# Patient Record
Sex: Female | Born: 1955
Health system: Southern US, Community
[De-identification: ages and names within clinical notes are randomized; demographics above are authoritative.]

## PROBLEM LIST (undated history)

## (undated) DIAGNOSIS — B019 Varicella without complication: Secondary | ICD-10-CM

## (undated) DIAGNOSIS — E785 Hyperlipidemia, unspecified: Secondary | ICD-10-CM

## (undated) DIAGNOSIS — E669 Obesity, unspecified: Secondary | ICD-10-CM

## (undated) DIAGNOSIS — I10 Essential (primary) hypertension: Secondary | ICD-10-CM

## (undated) DIAGNOSIS — E079 Disorder of thyroid, unspecified: Secondary | ICD-10-CM

## (undated) DIAGNOSIS — B9681 Helicobacter pylori [H. pylori] as the cause of diseases classified elsewhere: Secondary | ICD-10-CM

## (undated) DIAGNOSIS — M1990 Unspecified osteoarthritis, unspecified site: Secondary | ICD-10-CM

## (undated) DIAGNOSIS — C189 Malignant neoplasm of colon, unspecified: Secondary | ICD-10-CM

## (undated) DIAGNOSIS — G4730 Sleep apnea, unspecified: Secondary | ICD-10-CM

## (undated) HISTORY — PX: TOTAL THYROIDECTOMY: SHX2547

## (undated) HISTORY — DX: Sleep apnea, unspecified: G47.30

## (undated) HISTORY — DX: Obesity, unspecified: E66.9

## (undated) HISTORY — PX: TUBAL LIGATION: SHX77

## (undated) HISTORY — DX: Varicella without complication: B01.9

## (undated) HISTORY — DX: Essential (primary) hypertension: I10

## (undated) HISTORY — DX: Unspecified osteoarthritis, unspecified site: M19.90

## (undated) HISTORY — DX: Hyperlipidemia, unspecified: E78.5

---

## 1998-09-30 ENCOUNTER — Other Ambulatory Visit: Admission: RE | Admit: 1998-09-30 | Discharge: 1998-09-30 | Payer: Self-pay | Admitting: Obstetrics and Gynecology

## 1998-09-30 ENCOUNTER — Encounter (INDEPENDENT_AMBULATORY_CARE_PROVIDER_SITE_OTHER): Payer: Self-pay

## 1999-01-03 ENCOUNTER — Other Ambulatory Visit: Admission: RE | Admit: 1999-01-03 | Discharge: 1999-01-03 | Payer: Self-pay | Admitting: *Deleted

## 2000-04-02 ENCOUNTER — Other Ambulatory Visit: Admission: RE | Admit: 2000-04-02 | Discharge: 2000-04-02 | Payer: Self-pay | Admitting: Obstetrics and Gynecology

## 2000-04-03 ENCOUNTER — Other Ambulatory Visit: Admission: RE | Admit: 2000-04-03 | Discharge: 2000-04-03 | Payer: Self-pay | Admitting: Obstetrics and Gynecology

## 2000-04-03 ENCOUNTER — Encounter (INDEPENDENT_AMBULATORY_CARE_PROVIDER_SITE_OTHER): Payer: Self-pay | Admitting: Specialist

## 2000-07-02 ENCOUNTER — Other Ambulatory Visit: Admission: RE | Admit: 2000-07-02 | Discharge: 2000-07-02 | Payer: Self-pay | Admitting: Obstetrics and Gynecology

## 2000-07-05 ENCOUNTER — Encounter: Payer: Self-pay | Admitting: Obstetrics and Gynecology

## 2000-07-05 ENCOUNTER — Encounter: Admission: RE | Admit: 2000-07-05 | Discharge: 2000-07-05 | Payer: Self-pay | Admitting: Obstetrics and Gynecology

## 2000-07-12 ENCOUNTER — Encounter: Payer: Self-pay | Admitting: Obstetrics and Gynecology

## 2000-07-12 ENCOUNTER — Encounter: Admission: RE | Admit: 2000-07-12 | Discharge: 2000-07-12 | Payer: Self-pay | Admitting: Obstetrics and Gynecology

## 2001-09-12 ENCOUNTER — Encounter: Admission: RE | Admit: 2001-09-12 | Discharge: 2001-09-12 | Payer: Self-pay | Admitting: Sports Medicine

## 2001-09-12 ENCOUNTER — Encounter: Payer: Self-pay | Admitting: Sports Medicine

## 2001-12-25 ENCOUNTER — Encounter: Payer: Self-pay | Admitting: Unknown Physician Specialty

## 2001-12-25 ENCOUNTER — Encounter: Admission: RE | Admit: 2001-12-25 | Discharge: 2001-12-25 | Payer: Self-pay | Admitting: Unknown Physician Specialty

## 2002-07-08 ENCOUNTER — Encounter: Payer: Self-pay | Admitting: Emergency Medicine

## 2002-07-08 ENCOUNTER — Emergency Department (HOSPITAL_COMMUNITY): Admission: EM | Admit: 2002-07-08 | Discharge: 2002-07-08 | Payer: Self-pay | Admitting: Emergency Medicine

## 2007-01-17 ENCOUNTER — Other Ambulatory Visit: Admission: RE | Admit: 2007-01-17 | Discharge: 2007-01-17 | Payer: Self-pay | Admitting: Obstetrics and Gynecology

## 2007-02-10 ENCOUNTER — Emergency Department (HOSPITAL_COMMUNITY): Admission: EM | Admit: 2007-02-10 | Discharge: 2007-02-10 | Payer: Self-pay | Admitting: Family Medicine

## 2008-12-28 ENCOUNTER — Ambulatory Visit: Payer: Self-pay | Admitting: Internal Medicine

## 2009-02-01 ENCOUNTER — Ambulatory Visit: Payer: Self-pay | Admitting: Family Medicine

## 2009-02-01 DIAGNOSIS — M25569 Pain in unspecified knee: Secondary | ICD-10-CM | POA: Insufficient documentation

## 2009-04-27 ENCOUNTER — Other Ambulatory Visit: Admission: RE | Admit: 2009-04-27 | Discharge: 2009-04-27 | Payer: Self-pay | Admitting: Obstetrics and Gynecology

## 2009-12-18 HISTORY — PX: ABDOMINAL HYSTERECTOMY: SHX81

## 2009-12-29 ENCOUNTER — Ambulatory Visit (HOSPITAL_COMMUNITY): Admission: RE | Admit: 2009-12-29 | Payer: Self-pay | Admitting: Obstetrics and Gynecology

## 2010-01-10 ENCOUNTER — Other Ambulatory Visit: Payer: Self-pay | Admitting: Obstetrics and Gynecology

## 2010-01-17 ENCOUNTER — Encounter (INDEPENDENT_AMBULATORY_CARE_PROVIDER_SITE_OTHER): Payer: Self-pay | Admitting: Obstetrics and Gynecology

## 2010-01-17 ENCOUNTER — Inpatient Hospital Stay (HOSPITAL_COMMUNITY): Admission: RE | Admit: 2010-01-17 | Discharge: 2010-01-19 | Payer: Self-pay | Admitting: Obstetrics and Gynecology

## 2010-01-17 HISTORY — PX: OTHER SURGICAL HISTORY: SHX169

## 2010-04-10 ENCOUNTER — Encounter: Payer: Self-pay | Admitting: Obstetrics and Gynecology

## 2010-05-31 LAB — CBC
HCT: 36.2 % (ref 36.0–46.0)
MCH: 28.1 pg (ref 26.0–34.0)
MCHC: 33.2 g/dL (ref 30.0–36.0)
MCV: 84.7 fL (ref 78.0–100.0)
RDW: 14.9 % (ref 11.5–15.5)

## 2010-06-01 LAB — BASIC METABOLIC PANEL
BUN: 11 mg/dL (ref 6–23)
Calcium: 8.6 mg/dL (ref 8.4–10.5)
Creatinine, Ser: 0.88 mg/dL (ref 0.4–1.2)
GFR calc non Af Amer: >60 mL/min (ref >60)
Glucose, Bld: 94 mg/dL (ref 70–99)

## 2010-06-01 LAB — TYPE AND SCREEN

## 2010-06-01 LAB — URINALYSIS, ROUTINE W REFLEX MICROSCOPIC
Hgb urine dipstick: NEGATIVE
Protein, ur: NEGATIVE mg/dL
Urobilinogen, UA: 0.2 mg/dL (ref 0.0–1.0)

## 2010-06-01 LAB — SURGICAL PCR SCREEN: Staphylococcus aureus: NEGATIVE

## 2010-06-01 LAB — CBC
HCT: 44.4 % (ref 36.0–46.0)
MCHC: 32.8 g/dL (ref 30.0–36.0)
Platelets: 287 10*3/uL (ref 150–400)
RDW: 15 % (ref 11.5–15.5)

## 2010-12-27 LAB — HIV ANTIBODY (ROUTINE TESTING W REFLEX): HIV: NONREACTIVE

## 2010-12-27 LAB — HEPATITIS C ANTIBODY: HCV Ab: NEGATIVE

## 2011-05-31 ENCOUNTER — Telehealth (INDEPENDENT_AMBULATORY_CARE_PROVIDER_SITE_OTHER): Payer: Self-pay | Admitting: Surgery

## 2011-05-31 ENCOUNTER — Ambulatory Visit (INDEPENDENT_AMBULATORY_CARE_PROVIDER_SITE_OTHER): Payer: Commercial Managed Care - PPO | Admitting: Surgery

## 2011-05-31 NOTE — Telephone Encounter (Signed)
Called patient at [phone]. (She had left a voicemail for me asking to reschedule her initial consult with Dr Ezzard Standing for an early morning/late afternoon appointment as she works 3rd shift.)  No one answered the phone when I called nor did an answering machine pick up. Patient did not provide a cell phone #. R/s'd appt for 06/29/11 @ 3:30 pm. Attached a note to the appointment card explaining to the patient that Dr Ezzard Standing has appt slots available for New Bariatric patients at 10:45 am and 3:30 pm. Mailed all information to patient.

## 2011-06-15 ENCOUNTER — Encounter: Payer: 59 | Attending: Internal Medicine | Admitting: *Deleted

## 2011-06-15 ENCOUNTER — Encounter: Payer: Self-pay | Admitting: *Deleted

## 2011-06-15 DIAGNOSIS — Z713 Dietary counseling and surveillance: Secondary | ICD-10-CM | POA: Insufficient documentation

## 2011-06-15 DIAGNOSIS — Z724 Inappropriate diet and eating habits: Secondary | ICD-10-CM | POA: Insufficient documentation

## 2011-06-15 NOTE — Progress Notes (Signed)
  Medical Nutrition Therapy:  Appt start time: 0800 end time:  0900.  Assessment:  Primary concerns today: Obesity.  Judith Ross is here today for nutritional counseling r/t obesity.  States she wants to "learn how to eat well and read food labels".  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  Eats most meals out bc she lives alone and does not like to cook. Food recall shows excessive CHO, fat, and sodium intake. Discussed CHO counting, increasing fiber, and increasing exercise to facilitate weight loss.    MEDICATIONS: See medication list. Pt takes medications for blood pressure and hypothyroidism, though cannot remember dosing at this time.    DIETARY INTAKE:  Usual eating pattern includes 3 meals and 0 snacks per day.  24-hr recall:  B (8 AM): Grits, sausage ball (sm), [place] bacon, 1 pancake; diet sunkist  Snk ( AM): Sleeping  L (6 PM): 2 hotdogs w/ chili on bun, 1 pc key lime pie; diet dr. Reino Kent Snk ( PM): None  D (1-2 AM): Grilled cheese, curly fries; diet sunkist Snk ( PM): None Beverages: diet sodas, coffee, sweet tea, water w/ crystal light  Usual physical activity: None  Estimated energy needs: 1800 calories 160-185 g carbohydrates 80-95 g protein 35-40 g fat (<10-12 as saturated fat)  Progress Towards Goal(s):  In progress.   Nutritional Diagnosis:  Bay-3.3 Overweight/obesity related to excessive CHO and fat intake as evidenced by patient-reported food recall and BMI of 41.9 kg/m^2.    Intervention/Goals:  Add lean protein foods to all meals and snacks.  Follow "Plate Method" for portion control - measure portions at first.  Limit carbohydrate to 3 servings (45 grams) per meal.   Choose more whole grains, lean protein, low-fat dairy, and fruits/non-starchy vegetables.   Aim for >30 min of physical activity most days. Start slow with stationary bike, water walking, etc.  Limit sugar-sweetened beverages and  concentrated sweets to facilitate weight loss.  Limit meals out to decrease sodium intake.  Handouts given during visit include:  Low CHO Snack List  45 g CHO meals  [employer] Guide handout  Monitoring/Evaluation:  Dietary intake, exercise, and body weight in 6 week(s).

## 2011-06-15 NOTE — Patient Instructions (Addendum)
Goals:  Add lean protein foods to all meals and snacks.  Follow "Plate Method" for portion control - measure portions at first.  Limit carbohydrate to 3 servings (45 grams) per meal.   Choose more whole grains, lean protein, low-fat dairy, and fruits/non-starchy vegetables.   Aim for >30 min of physical activity most days. Start slow with stationary bike, water walking, etc.  Limit sugar-sweetened beverages and concentrated sweets to facilitate weight loss.  Limit meals out to decrease sodium intake.

## 2011-06-29 ENCOUNTER — Ambulatory Visit (INDEPENDENT_AMBULATORY_CARE_PROVIDER_SITE_OTHER): Payer: Commercial Managed Care - PPO | Admitting: Surgery

## 2011-08-03 ENCOUNTER — Emergency Department (HOSPITAL_COMMUNITY)
Admission: EM | Admit: 2011-08-03 | Discharge: 2011-08-03 | Disposition: A | Discharge status: Home Health | Payer: 59 | Source: Home / Self Care | Attending: Emergency Medicine | Admitting: Emergency Medicine

## 2011-08-03 ENCOUNTER — Telehealth (HOSPITAL_COMMUNITY): Payer: Self-pay | Admitting: *Deleted

## 2011-08-03 ENCOUNTER — Encounter (HOSPITAL_COMMUNITY): Payer: Self-pay

## 2011-08-03 DIAGNOSIS — N898 Other specified noninflammatory disorders of vagina: Secondary | ICD-10-CM

## 2011-08-03 DIAGNOSIS — S335XXA Sprain of ligaments of lumbar spine, initial encounter: Secondary | ICD-10-CM

## 2011-08-03 HISTORY — DX: Disorder of thyroid, unspecified: E07.9

## 2011-08-03 LAB — POCT URINALYSIS DIP (DEVICE)
Bilirubin Urine: NEGATIVE
Glucose, UA: NEGATIVE mg/dL
Hgb urine dipstick: NEGATIVE
Specific Gravity, Urine: >=1.03 (ref 1.005–1.030)

## 2011-08-03 LAB — WET PREP, GENITAL

## 2011-08-03 MED ORDER — MELOXICAM 7.5 MG PO TABS: 7.5000 mg | ORAL_TABLET | Freq: Every day | ORAL | Status: DC

## 2011-08-03 MED ORDER — FLUCONAZOLE 150 MG PO TABS: 150.0000 mg | ORAL_TABLET | Freq: Once | ORAL | Status: AC

## 2011-08-03 MED ORDER — METRONIDAZOLE 500 MG PO TABS: 500.0000 mg | ORAL_TABLET | Freq: Two times a day (BID) | ORAL | Status: AC

## 2011-08-03 MED ORDER — CYCLOBENZAPRINE HCL 10 MG PO TABS
10.0000 mg | ORAL_TABLET | Freq: Two times a day (BID) | ORAL | Status: AC | PRN
Start: 2011-08-03 — End: 2011-08-13

## 2011-08-03 MED ORDER — MELOXICAM 7.5 MG PO TABS: 7.5000 mg | ORAL_TABLET | Freq: Every day | ORAL | Status: AC

## 2011-08-03 MED ORDER — CYCLOBENZAPRINE HCL 10 MG PO TABS: 10.0000 mg | ORAL_TABLET | Freq: Two times a day (BID) | ORAL | Status: DC | PRN

## 2011-08-03 NOTE — ED Notes (Signed)
Pt c/o R lower back pain onset 1 week ago.  Pt denies increased frequency of urination, dysuria, hematuria.  Pt states she does have some slight discharge and odor.

## 2011-08-03 NOTE — Discharge Instructions (Signed)
Take this medicines as discussed and followup with your primary care Dr. if your back pain persists beyond 2-3 weeks. We will contact you if any abnormal test results will require further treatment   Back Exercises Back exercises help treat and prevent back injuries. The goal of back exercises is to increase the strength of your abdominal and back muscles and the flexibility of your back. These exercises should be started when you no longer have back pain. Back exercises include:  Pelvic Tilt. Lie on your back with your knees bent. Tilt your pelvis until the lower part of your back is against the floor. Hold this position 5 to 10 sec and repeat 5 to 10 times.   Knee to Chest. Pull first 1 knee up against your chest and hold for 20 to 30 seconds, repeat this with the other knee, and then both knees. This may be done with the other leg straight or bent, whichever feels better.   Sit-Ups or Curl-Ups. Bend your knees 90 degrees. Start with tilting your pelvis, and do a partial, slow sit-up, lifting your trunk only 30 to 45 degrees off the floor. Take at least 2 to 3 seconds for each sit-up. Do not do sit-ups with your knees out straight. If partial sit-ups are difficult, simply do the above but with only tightening your abdominal muscles and holding it as directed.   Hip-Lift. Lie on your back with your knees flexed 90 degrees. Push down with your feet and shoulders as you raise your hips a couple inches off the floor; hold for 10 seconds, repeat 5 to 10 times.   Back arches. Lie on your stomach, propping yourself up on bent elbows. Slowly press on your hands, causing an arch in your low back. Repeat 3 to 5 times. Any initial stiffness and discomfort should lessen with repetition over time.   Shoulder-Lifts. Lie face down with arms beside your body. Keep hips and torso pressed to floor as you slowly lift your head and shoulders off the floor.  Do not overdo your exercises, especially in the beginning.  Exercises may cause you some mild back discomfort which lasts for a few minutes; however, if the pain is more severe, or lasts for more than 15 minutes, do not continue exercises until you see your caregiver. Improvement with exercise therapy for back problems is slow.  See your caregivers for assistance with developing a proper back exercise program. Document Released: 04/13/2004 Document Revised: 02/23/2011 Document Reviewed: 03/06/2005 [facility] Patient Information 2012 [place], [place].Back Pain, Adult Low back pain is very common. About 1 in 5 people have back pain.The cause of low back pain is rarely dangerous. The pain often gets better over time.About half of people with a sudden onset of back pain feel better in just 2 weeks. About 8 in 10 people feel better by 6 weeks.  CAUSES Some common causes of back pain include:  Strain of the muscles or ligaments supporting the spine.   Wear and tear (degeneration) of the spinal discs.   Arthritis.   Direct injury to the back.  DIAGNOSIS Most of the time, the direct cause of low back pain is not known.However, back pain can be treated effectively even when the exact cause of the pain is unknown.Answering your caregiver's questions about your overall health and symptoms is one of the most accurate ways to make sure the cause of your pain is not dangerous. If your caregiver needs more information, he or she may order lab work  or imaging tests (X-rays or MRIs).However, even if imaging tests show changes in your back, this usually does not require surgery. HOME CARE INSTRUCTIONS For many people, back pain returns.Since low back pain is rarely dangerous, it is often a condition that people can learn to [facility] their own.   Remain active. It is stressful on the back to sit or stand in one place. Do not sit, drive, or stand in one place for more than 30 minutes at a time. Take short walks on level surfaces as soon as pain allows.Try to increase  the length of time you walk each day.   Do not stay in bed.Resting more than 1 or 2 days can delay your recovery.   Do not avoid exercise or work.Your body is made to move.It is not dangerous to be active, even though your back may hurt.Your back will likely heal faster if you return to being active before your pain is gone.   Pay attention to your body when you bend and lift. Many people have less discomfortwhen lifting if they bend their knees, keep the load close to their bodies,and avoid twisting. Often, the most comfortable positions are those that put less stress on your recovering back.   Find a comfortable position to sleep. Use a firm mattress and lie on your side with your knees slightly bent. If you lie on your back, put a pillow under your knees.   Only take over-the-counter or prescription medicines as directed by your caregiver. Over-the-counter medicines to reduce pain and inflammation are often the most helpful.Your caregiver may prescribe muscle relaxant drugs.These medicines help dull your pain so you can more quickly return to your normal activities and healthy exercise.   Put ice on the injured area.   Put ice in a plastic bag.   Place a towel between your skin and the bag.   Leave the ice on for 15 to 20 minutes, 3 to 4 times a day for the first 2 to 3 days. After that, ice and heat may be alternated to reduce pain and spasms.   Ask your caregiver about trying back exercises and gentle massage. This may be of some benefit.   Avoid feeling anxious or stressed.Stress increases muscle tension and can worsen back pain.It is important to recognize when you are anxious or stressed and learn ways to manage it.Exercise is a great option.  SEEK MEDICAL CARE IF:  You have pain that is not relieved with rest or medicine.   You have pain that does not improve in 1 week.   You have new symptoms.   You are generally not feeling well.  SEEK IMMEDIATE MEDICAL CARE IF:    You have pain that radiates from your back into your legs.   You develop new bowel or bladder control problems.   You have unusual weakness or numbness in your arms or legs.   You develop nausea or vomiting.   You develop abdominal pain.   You feel faint.  Document Released: 03/06/2005 Document Revised: 02/23/2011 Document Reviewed: 07/25/2010 [facility] Patient Information 2012 [place], [place].

## 2011-08-03 NOTE — ED Provider Notes (Signed)
History     CSN: [id number]  Arrival date & time 08/03/11  0809   First MD Initiated Contact with Patient 08/03/11 0820      Chief Complaint  Patient presents with  . Back Pain    (Consider location/radiation/quality/duration/timing/severity/associated sxs/prior treatment) HPI Comments: Patient presents to urgent care with right-sided lower back pain for about a week, and also have been noticing a discrete colored vaginal discharge for about 5 days. She denies any falls or recent injuries or gestures that she can relate to this newly develop back pain. Patient denies any urinary symptoms such as, pressure or burning or discomfort. She has no concerns about having been exposed to any sexually transmitted illness. She denies any abdominal pain, nausea vomiting or diarrhea as.  Right-sided back pain worse when she moves or leans forward, denies any radiation of her pain, denies any numbness or tingling or weakness.  Patient is a 56 y.o. female presenting with back pain. The history is provided by the patient.  Back Pain  This is a new problem. The current episode started more than 1 week ago. The problem occurs constantly. The problem has been gradually worsening. The pain is associated with no known injury. The pain is present in the lumbar spine. The pain is at a severity of 8/10. The pain is moderate. The symptoms are aggravated by bending, twisting and certain positions. Pertinent negatives include no fever, no numbness, no abdominal pain, no bladder incontinence, no dysuria, no pelvic pain, no paresthesias, no paresis, no tingling and no weakness. She has tried nothing for the symptoms.    Past Medical History  Diagnosis Date  . Hyperlipidemia   . Hypertension   . Sleep apnea   . Obesity   . Thyroid disease     Past Surgical History  Procedure Date  . Abdominal hysterectomy   . Tubal ligation   . Total thyroidectomy 1991-92    Family History  Problem Relation Age of Onset    . Stroke Father   . Diabetes Maternal Uncle   . Diabetes Maternal Grandmother   . Hypertension Maternal Grandmother     History  Substance Use Topics  . Smoking status: Never Smoker   . Smokeless tobacco: Not on file  . Alcohol Use: No    OB History    Grav Para Term Preterm Abortions TAB SAB Ect Mult Living                  Review of Systems  Constitutional: Negative for fever, chills, activity change, appetite change and unexpected weight change.  Respiratory: Negative for shortness of breath.   Gastrointestinal: Negative for abdominal pain.  Genitourinary: Negative for bladder incontinence, dysuria, difficulty urinating and pelvic pain.  Musculoskeletal: Positive for back pain. Negative for joint swelling and gait problem.  Skin: Negative for color change and rash.  Neurological: Negative for tingling, weakness, numbness and paresthesias.    Allergies  Erythromycin  Home Medications   Current Outpatient Rx  Name Route Sig Dispense Refill  . BENAZEPRIL HCL 10 MG PO TABS Oral Take 10 mg by mouth daily.    Marland Kitchen LEVOTHYROXINE SODIUM 100 MCG PO CAPS Oral Take by mouth.    Marland Kitchen PRAVACHOL PO Oral Take by mouth.    . CYCLOBENZAPRINE HCL 10 MG PO TABS Oral Take 1 tablet (10 mg total) by mouth 2 (two) times daily as needed for muscle spasms. 15 tablet 0  . HYDROCHLOROTHIAZIDE PO Oral Take by mouth daily.    Marland Kitchen  MELOXICAM 7.5 MG PO TABS Oral Take 1 tablet (7.5 mg total) by mouth daily. 14 tablet 0  . METRONIDAZOLE 500 MG PO TABS Oral Take 1 tablet (500 mg total) by mouth 2 (two) times daily. 14 tablet 0  . METRONIDAZOLE 500 MG PO TABS Oral Take 1 tablet (500 mg total) by mouth 2 (two) times daily. 14 tablet 0    BP 125/67  Pulse 66  Temp(Src) 98.8 F (37.1 C) (Oral)  Resp 18  SpO2 97%  Physical Exam  Nursing note and vitals reviewed. Constitutional: She appears well-developed and well-nourished.  HENT:  Head: Normocephalic.  Eyes: Conjunctivae are normal.  Neck: Neck  supple.  Pulmonary/Chest: Effort normal.  Abdominal: Soft. She exhibits no distension. There is no tenderness. There is no rebound.  Genitourinary:    Cervix exhibits no motion tenderness, no discharge and no friability. Right adnexum displays no mass and no tenderness. Left adnexum displays no mass. Vaginal discharge found.  Musculoskeletal: Normal range of motion. She exhibits no tenderness.       Lumbar back: She exhibits tenderness and pain. She exhibits normal range of motion, no bony tenderness, no laceration, no spasm and normal pulse.       Back:  Neurological: She is alert.  Skin: No erythema.    ED Course  Procedures (including critical care time)   Labs Reviewed  POCT URINALYSIS DIP (DEVICE)  POCT PREGNANCY, URINE  URINALYSIS, DIPSTICK ONLY  WET PREP, GENITAL  GC/CHLAMYDIA PROBE AMP, GENITAL   No results found.   1. Sprain lumbar region   2. Vaginal Discharge       MDM  Right-sided lumbar sprain, with vaginal discharge. Exam was consistent with paravertebral tenderness in the absence of any neuromuscular deficits or symptoms. Also coexistent with a mild vaginal discharge. Homogeneous and with some odor decided to treat empirically for bacterial vaginosis prior culture and wet prep results available. Patient agree with treatment plan and followup care as necessary.       Jimmie Molly, MD 08/03/11 [phone]

## 2011-08-03 NOTE — ED Notes (Signed)
GC/Chlamydia pending, Wet prep: few yeast, few clue cells, few WBC's.  Pt. Adequately treated with Flagyl. Chart shownt to Dr. Ladon Applebaum and order obtained for Diflucan 150 mg. 1 tab po x 1 dose.  I called but no answer on pt.'s phone. Unable to leave a message. Vassie Moselle 08/03/2011

## 2011-08-04 ENCOUNTER — Telehealth (HOSPITAL_COMMUNITY): Payer: Self-pay | Admitting: *Deleted

## 2011-08-04 NOTE — ED Notes (Signed)
Pt. called on VM this AM and left a cell number to call her.  She said to leave a message on her cell.  I called and left a message to call. Vassie Moselle 08/04/2011

## 2011-08-07 ENCOUNTER — Telehealth (HOSPITAL_COMMUNITY): Payer: Self-pay | Admitting: *Deleted

## 2011-08-07 NOTE — ED Notes (Signed)
Have note that pt. called yesterday for results and would like me to call her.  I called home number but no answer. I called cell # and left message to call. Call 3. Vassie Moselle 08/07/2011

## 2011-08-07 NOTE — ED Notes (Addendum)
Pt. called back and verified x2. Pt. given results and told she was adequately treated with Flagyl but needs Diflucan for a yeast infection. Pt. wants Rx. called to [facility]. Rx. called to voicemail @ [phone]. Vassie Moselle 08/07/2011

## 2011-08-09 ENCOUNTER — Ambulatory Visit (INDEPENDENT_AMBULATORY_CARE_PROVIDER_SITE_OTHER): Payer: Self-pay | Admitting: Surgery

## 2014-05-11 ENCOUNTER — Encounter: Payer: Self-pay | Admitting: Internal Medicine

## 2014-05-11 ENCOUNTER — Ambulatory Visit (INDEPENDENT_AMBULATORY_CARE_PROVIDER_SITE_OTHER): Payer: 59 | Admitting: Internal Medicine

## 2014-05-11 VITALS — BP 126/88 | HR 72 | Temp 98.1°F | Wt 278.0 lb

## 2014-05-11 DIAGNOSIS — M545 Low back pain, unspecified: Secondary | ICD-10-CM

## 2014-05-11 DIAGNOSIS — I10 Essential (primary) hypertension: Secondary | ICD-10-CM

## 2014-05-11 DIAGNOSIS — G4733 Obstructive sleep apnea (adult) (pediatric): Secondary | ICD-10-CM | POA: Insufficient documentation

## 2014-05-11 DIAGNOSIS — E785 Hyperlipidemia, unspecified: Secondary | ICD-10-CM

## 2014-05-11 DIAGNOSIS — E039 Hypothyroidism, unspecified: Secondary | ICD-10-CM

## 2014-05-11 DIAGNOSIS — M1990 Unspecified osteoarthritis, unspecified site: Secondary | ICD-10-CM

## 2014-05-11 LAB — COMPREHENSIVE METABOLIC PANEL
ALT: 32 U/L (ref 0–35)
AST: 23 U/L (ref 0–37)
Albumin: 3.9 g/dL (ref 3.5–5.2)
Alkaline Phosphatase: 95 U/L (ref 39–117)
BILIRUBIN TOTAL: 0.3 mg/dL (ref 0.2–1.2)
BUN: 14 mg/dL (ref 6–23)
CALCIUM: 8.9 mg/dL (ref 8.4–10.5)
CHLORIDE: 105 meq/L (ref 96–112)
CO2: 28 mEq/L (ref 19–32)
CREATININE: 0.8 mg/dL (ref 0.40–1.20)
GFR: 94.31 mL/min (ref >60.00)
Glucose, Bld: 81 mg/dL (ref 70–99)
Potassium: 3.9 mEq/L (ref 3.5–5.1)
SODIUM: 141 meq/L (ref 135–145)
TOTAL PROTEIN: 7.5 g/dL (ref 6.0–8.3)

## 2014-05-11 LAB — LIPID PANEL
Cholesterol: 218 mg/dL — ABNORMAL HIGH (ref 0–200)
HDL: 70.1 mg/dL (ref >39.00)
LDL Cholesterol: 134 mg/dL — ABNORMAL HIGH (ref 0–99)
NONHDL: 147.9
TRIGLYCERIDES: 68 mg/dL (ref 0.0–149.0)
Total CHOL/HDL Ratio: 3
VLDL: 13.6 mg/dL (ref 0.0–40.0)

## 2014-05-11 LAB — TSH: TSH: 3.15 u[IU]/mL (ref 0.35–4.50)

## 2014-05-11 LAB — CBC
HEMATOCRIT: 45.4 % (ref 36.0–46.0)
Hemoglobin: 14.9 g/dL (ref 12.0–15.0)
MCHC: 32.9 g/dL (ref 30.0–36.0)
MCV: 82.2 fl (ref 78.0–100.0)
Platelets: 257 10*3/uL (ref 150.0–400.0)
RBC: 5.52 Mil/uL — AB (ref 3.87–5.11)
RDW: 15.3 % (ref 11.5–15.5)
WBC: 7.6 10*3/uL (ref 4.0–10.5)

## 2014-05-11 LAB — T4, FREE: FREE T4: 1.02 ng/dL (ref 0.60–1.60)

## 2014-05-11 LAB — HEMOGLOBIN A1C: Hgb A1c MFr Bld: 6 % (ref 4.6–6.5)

## 2014-05-11 MED ORDER — IBUPROFEN 800 MG PO TABS: 800.0000 mg | ORAL_TABLET | Freq: Three times a day (TID) | ORAL | Status: DC | PRN

## 2014-05-11 NOTE — Assessment & Plan Note (Signed)
Non compliant with treatment regimen

## 2014-05-11 NOTE — Assessment & Plan Note (Signed)
She will take Ibuprofen prn Will check kidney function today

## 2014-05-11 NOTE — Patient Instructions (Signed)
Back Exercises These exercises may help you when beginning to rehabilitate your injury. Your symptoms may resolve with or without further involvement from your physician, physical therapist or athletic trainer. While completing these exercises, remember:   Restoring tissue flexibility helps normal motion to return to the joints. This allows healthier, less painful movement and activity.  An effective stretch should be held for at least 30 seconds.  A stretch should never be painful. You should only feel a gentle lengthening or release in the stretched tissue. STRETCH - Extension, Prone on Elbows   Lie on your stomach on the floor, a bed will be too soft. Place your palms about shoulder width apart and at the height of your head.  Place your elbows under your shoulders. If this is too painful, stack pillows under your chest.  Allow your body to relax so that your hips drop lower and make contact more completely with the floor.  Hold this position for __________ seconds.  Slowly return to lying flat on the floor. Repeat __________ times. Complete this exercise __________ times per day.  RANGE OF MOTION - Extension, Prone Press Ups   Lie on your stomach on the floor, a bed will be too soft. Place your palms about shoulder width apart and at the height of your head.  Keeping your back as relaxed as possible, slowly straighten your elbows while keeping your hips on the floor. You may adjust the placement of your hands to maximize your comfort. As you gain motion, your hands will come more underneath your shoulders.  Hold this position __________ seconds.  Slowly return to lying flat on the floor. Repeat __________ times. Complete this exercise __________ times per day.  RANGE OF MOTION- Quadruped, Neutral Spine   Assume a hands and knees position on a firm surface. Keep your hands under your shoulders and your knees under your hips. You may place padding under your knees for  comfort.  Drop your head and point your tail bone toward the ground below you. This will round out your low back like an angry cat. Hold this position for __________ seconds.  Slowly lift your head and release your tail bone so that your back sags into a large arch, like an old horse.  Hold this position for __________ seconds.  Repeat this until you feel limber in your low back.  Now, find your "sweet spot." This will be the most comfortable position somewhere between the two previous positions. This is your neutral spine. Once you have found this position, tense your stomach muscles to support your low back.  Hold this position for __________ seconds. Repeat __________ times. Complete this exercise __________ times per day.  STRETCH - Flexion, Single Knee to Chest   Lie on a firm bed or floor with both legs extended in front of you.  Keeping one leg in contact with the floor, bring your opposite knee to your chest. Hold your leg in place by either grabbing behind your thigh or at your knee.  Pull until you feel a gentle stretch in your low back. Hold __________ seconds.  Slowly release your grasp and repeat the exercise with the opposite side. Repeat __________ times. Complete this exercise __________ times per day.  STRETCH - Hamstrings, Standing  Stand or sit and extend your right / left leg, placing your foot on a chair or foot stool  Keeping a slight arch in your low back and your hips straight forward.  Lead with your chest and   lean forward at the waist until you feel a gentle stretch in the back of your right / left knee or thigh. (When done correctly, this exercise requires leaning only a small distance.)  Hold this position for __________ seconds. Repeat __________ times. Complete this stretch __________ times per day. STRENGTHENING - Deep Abdominals, Pelvic Tilt   Lie on a firm bed or floor. Keeping your legs in front of you, bend your knees so they are both pointed  toward the ceiling and your feet are flat on the floor.  Tense your lower abdominal muscles to press your low back into the floor. This motion will rotate your pelvis so that your tail bone is scooping upwards rather than pointing at your feet or into the floor.  With a gentle tension and even breathing, hold this position for __________ seconds. Repeat __________ times. Complete this exercise __________ times per day.  STRENGTHENING - Abdominals, Crunches   Lie on a firm bed or floor. Keeping your legs in front of you, bend your knees so they are both pointed toward the ceiling and your feet are flat on the floor. Cross your arms over your chest.  Slightly tip your chin down without bending your neck.  Tense your abdominals and slowly lift your trunk high enough to just clear your shoulder blades. Lifting higher can put excessive stress on the low back and does not further strengthen your abdominal muscles.  Control your return to the starting position. Repeat __________ times. Complete this exercise __________ times per day.  STRENGTHENING - Quadruped, Opposite UE/LE Lift   Assume a hands and knees position on a firm surface. Keep your hands under your shoulders and your knees under your hips. You may place padding under your knees for comfort.  Find your neutral spine and gently tense your abdominal muscles so that you can maintain this position. Your shoulders and hips should form a rectangle that is parallel with the floor and is not twisted.  Keeping your trunk steady, lift your right hand no higher than your shoulder and then your left leg no higher than your hip. Make sure you are not holding your breath. Hold this position __________ seconds.  Continuing to keep your abdominal muscles tense and your back steady, slowly return to your starting position. Repeat with the opposite arm and leg. Repeat __________ times. Complete this exercise __________ times per day. Document Released:  03/24/2005 Document Revised: 05/29/2011 Document Reviewed: 06/18/2008 [employer] Patient Information 2015 [employer]. This information is not intended to replace advice given to you by your health care provider. Make sure you discuss any questions you have with your health care provider.  

## 2014-05-11 NOTE — Assessment & Plan Note (Signed)
Will check TSH and T4 today Will adjust med if needed

## 2014-05-11 NOTE — Assessment & Plan Note (Signed)
Encouraged her to work on diet and exercise 

## 2014-05-11 NOTE — Assessment & Plan Note (Signed)
No issues on Crestor Will check CMET and Lipid profile today Encouraged her to consume a low fat diet

## 2014-05-11 NOTE — Progress Notes (Signed)
Pre visit review using our clinic review tool, if applicable. No additional management support is needed unless otherwise documented below in the visit note. 

## 2014-05-11 NOTE — Progress Notes (Signed)
HPI  Pt presents to the clinic today to establish care. She is transferring care from [facility].  Flu: 12/2013 Tetanus: < 10 years ago Pap Smear: Complete hysterectomy 2011 Mammogram: unsure of last one Colon Screening: never Vision Screening: 04/2014 Dentist: as needed  HTN: BP well controlled on Lotensin and HCTZ. She is not sure what dose of HCTZ she is taking. Her BP today is 126/88.   HLD: She denies myalgias on Crestor. She does not consume a low fat diet. She does not exercise.  OSA: Had sleep study done which confirmed her sleep apnea. Never picked up her CPAP machine  Obesity: Does not adhere to any specific diet or exercise regimen.  Hypothyroidism: Denies s/s of hypothyroidism on current dose of synthroid.   Arthirits: Mainly in her knees. Takes Aleve OTC prn.  Low back pain: Started 4-5 months ago. She describes the pain as sharp. The pain does not radiate down her legs. Bending, laying down makes it worse. Nothing seems to make it better. She denies any injury to the area. Aleve does not seem to help.   Past Medical History  Diagnosis Date  . Hyperlipidemia   . Hypertension   . Sleep apnea   . Obesity   . Thyroid disease   . Arthritis   . Chicken pox     Current Outpatient Prescriptions  Medication Sig Dispense Refill  . benazepril (LOTENSIN) 10 MG tablet Take 10 mg by mouth daily.    Marland Kitchen HYDROCHLOROTHIAZIDE PO Take by mouth daily.    . Levothyroxine Sodium 100 MCG CAPS Take 1 capsule by mouth daily.     . rosuvastatin (CRESTOR) 10 MG tablet Take 10 mg by mouth daily.     No current facility-administered medications for this visit.    Allergies  Allergen Reactions  . Erythromycin Itching    Family History  Problem Relation Age of Onset  . Stroke Father   . Diabetes Maternal Uncle   . Diabetes Maternal Grandmother   . Hypertension Maternal Grandmother   . Cancer Maternal Aunt     Breast    History   Social History  . Marital  Status: Divorced    Spouse Name: N/A  . Number of Children: N/A  . Years of Education: N/A   Occupational History  . Not on file.   Social History Main Topics  . Smoking status: Never Smoker   . Smokeless tobacco: Not on file  . Alcohol Use: No  . Drug Use: Not on file  . Sexual Activity: Not on file   Other Topics Concern  . Not on file   Social History Narrative    ROS:  Constitutional: Denies fever, malaise, fatigue, headache or abrupt weight changes.  Respiratory: Denies difficulty breathing, shortness of breath, cough or sputum production.   Cardiovascular: Denies chest pain, chest tightness, palpitations or swelling in the hands or feet.  Musculoskeletal: Pt reports joint pain. Denies decrease in range of motion, difficulty with gait, muscle pain.  Skin: Denies redness, rashes, lesions or ulcercations.  Neurological: Denies dizziness, difficulty with memory, difficulty with speech or problems with balance and coordination.  Psych: Pt denies anxiety, depression, SI/HI.  No other specific complaints in a complete review of systems (except as listed in HPI above).  PE:  BP 126/88 mmHg  Pulse 72  Temp(Src) 98.1 F (36.7 C) (Oral)  Wt 278 lb (126.1 kg)  SpO2 98% Wt Readings from Last 3 Encounters:  05/11/14 278 lb (126.1 kg)  06/15/11 259 lb 8 oz (117.708 kg)  02/01/09 245 lb (111.131 kg)    General: Appears her stated age, obese in NAD. HEENT: Head: normal shape and size; Eyes: sclera white, no icterus, conjunctiva pink, PERRLA and EOMs intact; Ears:  Neck: Normal range of motion. Neck supple, trachea midline. No masses, lumps or thyromegaly present.  Cardiovascular: Normal rate and rhythm. S1,S2 noted.  No murmur, rubs or gallops noted. No JVD or BLE edema. No carotid bruits noted. Pulmonary/Chest: Normal effort and positive vesicular breath sounds. No respiratory distress. No wheezes, rales or ronchi noted.  Musculoskeletal: Normal flexion and extension of  bilateral knees. Some crepitus noted with ROM. No signs of joint swelling. No difficulty with gait.  Neurological: Alert and oriented.  Psychiatric: Mood and affect normal. Behavior is normal. Judgment and thought content normal.     BMET    Component Value Date/Time   NA 136 01/10/2010 0915   K 4.0 01/10/2010 0915   CL 101 01/10/2010 0915   CO2 29 01/10/2010 0915   GLUCOSE 94 01/10/2010 0915   BUN 11 01/10/2010 0915   CREATININE 0.88 01/10/2010 0915   CALCIUM 8.6 01/10/2010 0915   GFRNONAA >60 01/10/2010 0915   GFRAA  01/10/2010 0915    >60        The eGFR has been calculated using the MDRD equation. This calculation has not been validated in all clinical situations. eGFR's persistently <60 mL/min signify possible Chronic Kidney Disease.    Lipid Panel  No results found for: CHOL, TRIG, HDL, CHOLHDL, VLDL, LDLCALC  CBC    Component Value Date/Time   WBC 12.9* 01/18/2010 0530   RBC 4.27 01/18/2010 0530   HGB 12.0 01/18/2010 0530   HCT 36.2 01/18/2010 0530   PLT 242 01/18/2010 0530   MCV 84.7 01/18/2010 0530   MCH 28.1 01/18/2010 0530   MCHC 33.2 01/18/2010 0530   RDW 14.9 01/18/2010 0530    Hgb A1C No results found for: HGBA1C   Assessment and Plan:  Health Maintenance:  She declines setting up mammogram or colon screening  Low back pain without sciatica:  Exacerbated by weight Encouraged her to work on weight loss Seems muscular RX for Ibuprofen 800 mg tabs Q8H prn  Stretching exercises given A heating pad may be helpful  RTC in 6 months or sooner if needed

## 2014-05-11 NOTE — Assessment & Plan Note (Signed)
Well controlled on Lotensin and HCTZ Will check CBC and CMET today

## 2014-05-12 ENCOUNTER — Telehealth: Payer: Self-pay | Admitting: Internal Medicine

## 2014-05-12 NOTE — Telephone Encounter (Signed)
emmi emailed °

## 2014-10-08 ENCOUNTER — Ambulatory Visit (INDEPENDENT_AMBULATORY_CARE_PROVIDER_SITE_OTHER): Payer: 59 | Admitting: Internal Medicine

## 2014-10-08 ENCOUNTER — Encounter: Payer: Self-pay | Admitting: Internal Medicine

## 2014-10-08 VITALS — BP 126/84 | HR 75 | Temp 97.7°F | Wt 268.0 lb

## 2014-10-08 DIAGNOSIS — I10 Essential (primary) hypertension: Secondary | ICD-10-CM | POA: Diagnosis not present

## 2014-10-08 DIAGNOSIS — M791 Myalgia: Secondary | ICD-10-CM

## 2014-10-08 DIAGNOSIS — M609 Myositis, unspecified: Secondary | ICD-10-CM | POA: Diagnosis not present

## 2014-10-08 DIAGNOSIS — E039 Hypothyroidism, unspecified: Secondary | ICD-10-CM | POA: Diagnosis not present

## 2014-10-08 DIAGNOSIS — R7309 Other abnormal glucose: Secondary | ICD-10-CM | POA: Diagnosis not present

## 2014-10-08 DIAGNOSIS — Z23 Encounter for immunization: Secondary | ICD-10-CM

## 2014-10-08 DIAGNOSIS — M1990 Unspecified osteoarthritis, unspecified site: Secondary | ICD-10-CM

## 2014-10-08 DIAGNOSIS — E785 Hyperlipidemia, unspecified: Secondary | ICD-10-CM

## 2014-10-08 DIAGNOSIS — B373 Candidiasis of vulva and vagina: Secondary | ICD-10-CM

## 2014-10-08 DIAGNOSIS — B3731 Acute candidiasis of vulva and vagina: Secondary | ICD-10-CM

## 2014-10-08 DIAGNOSIS — R7303 Prediabetes: Secondary | ICD-10-CM

## 2014-10-08 DIAGNOSIS — G4733 Obstructive sleep apnea (adult) (pediatric): Secondary | ICD-10-CM

## 2014-10-08 DIAGNOSIS — IMO0001 Reserved for inherently not codable concepts without codable children: Secondary | ICD-10-CM

## 2014-10-08 LAB — COMPREHENSIVE METABOLIC PANEL
ALBUMIN: 4.1 g/dL (ref 3.5–5.2)
ALT: 26 U/L (ref 0–35)
AST: 24 U/L (ref 0–37)
Alkaline Phosphatase: 93 U/L (ref 39–117)
BILIRUBIN TOTAL: 0.3 mg/dL (ref 0.2–1.2)
BUN: 22 mg/dL (ref 6–23)
CO2: 32 mEq/L (ref 19–32)
CREATININE: 0.96 mg/dL (ref 0.40–1.20)
Calcium: 9.1 mg/dL (ref 8.4–10.5)
Chloride: 99 mEq/L (ref 96–112)
GFR: 76.31 mL/min (ref >60.00)
GLUCOSE: 97 mg/dL (ref 70–99)
POTASSIUM: 3.9 meq/L (ref 3.5–5.1)
Sodium: 138 mEq/L (ref 135–145)
Total Protein: 7.6 g/dL (ref 6.0–8.3)

## 2014-10-08 LAB — CBC
HCT: 46.6 % — ABNORMAL HIGH (ref 36.0–46.0)
Hemoglobin: 15 g/dL (ref 12.0–15.0)
MCHC: 32.2 g/dL (ref 30.0–36.0)
MCV: 84.9 fl (ref 78.0–100.0)
Platelets: 296 10*3/uL (ref 150.0–400.0)
RBC: 5.49 Mil/uL — ABNORMAL HIGH (ref 3.87–5.11)
RDW: 14.7 % (ref 11.5–15.5)
WBC: 8.4 10*3/uL (ref 4.0–10.5)

## 2014-10-08 LAB — LIPID PANEL
CHOL/HDL RATIO: 4
Cholesterol: 261 mg/dL — ABNORMAL HIGH (ref 0–200)
HDL: 66 mg/dL (ref >39.00)
LDL Cholesterol: 178 mg/dL — ABNORMAL HIGH (ref 0–99)
NONHDL: 195
Triglycerides: 85 mg/dL (ref 0.0–149.0)
VLDL: 17 mg/dL (ref 0.0–40.0)

## 2014-10-08 LAB — HEMOGLOBIN A1C: Hgb A1c MFr Bld: 5.9 % (ref 4.6–6.5)

## 2014-10-08 LAB — TSH: TSH: 1.06 u[IU]/mL (ref 0.35–4.50)

## 2014-10-08 LAB — CK: Total CK: 143 U/L (ref 7–177)

## 2014-10-08 LAB — T4, FREE: Free T4: 1.45 ng/dL (ref 0.60–1.60)

## 2014-10-08 MED ORDER — FLUCONAZOLE 150 MG PO TABS: 150.0000 mg | ORAL_TABLET | Freq: Once | ORAL | Status: DC

## 2014-10-08 NOTE — Progress Notes (Signed)
Subjective:    Patient ID: Judith Ross, female    DOB: Oct 03, 1955, 59 y.o.   MRN: [id number]  HPI  Pt presents to the clinic today for 6 month follow up of chronic conditions.  Arthritis: Mainly in both of her knees. She knows this is exacerbated by her weight. She does take Aleve OTC as needed, without much relief.  HTN: BP is well controlled on Benzepril and HCTZ. Her BP today is 126/84.  HLD: She has been having muscle aches all over her body. This started 1 year ago. She is not sure if it is related to the Crestor. She did stop taking the Crestor and started Red Yeast Rice in its place. She does not consume a low fat diet.  OSA: She does not use a CPAP machine.  Obesity: She has lost 10 lbs since her last visit. Her BMI is 43.26. She does not exercise.  Prediabetes: Her last A1C was 6.0% (04/2014). She denies increased thirst, frequency, or numbness/tingling in hands or feet.  Hypothyroidism: She denies weight gain, hair loss, abnormal cold sensation, fatigue or consitpation. She is taking her Synthroid as prescribed.  She also reports vaginal irritation and itching. She reports this started 1 year ago. She denies any discharge or odor to the area. She denies urinary symptoms. She is not sexually active. She has tried Monistat, Vaseline, some sort of antiboitic, and Vagisil without relief.  Review of Systems      Past Medical History  Diagnosis Date  . Hyperlipidemia   . Hypertension   . Sleep apnea   . Obesity   . Thyroid disease   . Arthritis   . Chicken pox     Current Outpatient Prescriptions  Medication Sig Dispense Refill  . benazepril (LOTENSIN) 10 MG tablet Take 10 mg by mouth daily.    . hydrochlorothiazide (HYDRODIURIL) 25 MG tablet Take 25 mg by mouth daily.    . Levothyroxine Sodium 100 MCG CAPS Take 1 capsule by mouth daily.     . rosuvastatin (CRESTOR) 10 MG tablet Take 10 mg by mouth daily.     No current facility-administered medications for  this visit.    Allergies  Allergen Reactions  . Erythromycin Itching    Family History  Problem Relation Age of Onset  . Stroke Father   . Diabetes Maternal Uncle   . Diabetes Maternal Grandmother   . Hypertension Maternal Grandmother   . Cancer Maternal Aunt     Breast    History   Social History  . Marital Status: Divorced    Spouse Name: N/A  . Number of Children: N/A  . Years of Education: N/A   Occupational History  . Not on file.   Social History Main Topics  . Smoking status: Never Smoker   . Smokeless tobacco: Not on file  . Alcohol Use: No  . Drug Use: No  . Sexual Activity: Yes   Other Topics Concern  . Not on file   Social History Narrative     Constitutional: Denies fever, malaise, fatigue, headache or abrupt weight changes.  HEENT: Denies eye pain, eye redness, ear pain, ringing in the ears, wax buildup, runny nose, nasal congestion, bloody nose, or sore throat. Respiratory: Denies difficulty breathing, shortness of breath, cough or sputum production.   Cardiovascular: Denies chest pain, chest tightness, palpitations or swelling in the hands or feet.  Gastrointestinal: Pt reports loose stool. Denies abdominal pain, bloating, constipation, diarrhea or blood in the stool.  GU: Pt reports vaginal itching. Denies urgency, frequency, pain with urination, burning sensation, blood in urine, odor or discharge. Musculoskeletal: Pt reports knee pain. Denies decrease in range of motion, difficulty with gait, muscle pain or joint swelling.  Skin: Denies redness, rashes, lesions or ulcercations.  Neurological: Denies dizziness, difficulty with memory, difficulty with speech or problems with balance and coordination.  Psych: Denies anxiety, depression, SI/HI.  No other specific complaints in a complete review of systems (except as listed in HPI above).  Objective:   Physical Exam   BP 126/84 mmHg  Pulse 75  Temp(Src) 97.7 F (36.5 C) (Oral)  Wt 268 lb  (121.564 kg)  SpO2 98% Wt Readings from Last 3 Encounters:  10/08/14 268 lb (121.564 kg)  05/11/14 278 lb (126.1 kg)  06/15/11 259 lb 8 oz (117.708 kg)    General: Appears her stated age, obese in NAD. Skin: Warm, dry and intact. No rashes, lesions or ulcerations noted. HEENT: Head: normal shape and size; Eyes: sclera white, no icterus, conjunctiva pink, PERRLA and EOMs intact;  Neck:  Neck supple, trachea midline. No masses, lumps present. Mild thyromegaly noted. Cardiovascular: Normal rate and rhythm. S1,S2 noted.  No murmur, rubs or gallops noted. No JVD or BLE edema. No carotid bruits noted. Pulmonary/Chest: Normal effort and positive vesicular breath sounds. No respiratory distress. No wheezes, rales or ronchi noted.  Abdomen: Soft and nontender. Normal bowel sounds, no bruits noted. No distention or masses noted.  Pelvic: Normal female anatomy. Areas of hypopigmentation noted on the external labia. No discharge or odor noted. Musculoskeletal: Normal flexion and extension of the knees. No crepitus noted with ROM. No signs of joint swelling. No difficulty with gait.  Neurological: Alert and oriented. Sensation intact to BLE. Psychiatric: Mood and affect normal. Behavior is normal. Judgment and thought content normal.     BMET    Component Value Date/Time   NA 141 05/11/2014 1121   K 3.9 05/11/2014 1121   CL 105 05/11/2014 1121   CO2 28 05/11/2014 1121   GLUCOSE 81 05/11/2014 1121   BUN 14 05/11/2014 1121   CREATININE 0.80 05/11/2014 1121   CALCIUM 8.9 05/11/2014 1121   GFRNONAA >60 01/10/2010 0915   GFRAA  01/10/2010 0915    >60        The eGFR has been calculated using the MDRD equation. This calculation has not been validated in all clinical situations. eGFR's persistently <60 mL/min signify possible Chronic Kidney Disease.    Lipid Panel     Component Value Date/Time   CHOL 218* 05/11/2014 1121   TRIG 68.0 05/11/2014 1121   HDL 70.10 05/11/2014 1121    CHOLHDL 3 05/11/2014 1121   VLDL 13.6 05/11/2014 1121   LDLCALC 134* 05/11/2014 1121    CBC    Component Value Date/Time   WBC 7.6 05/11/2014 1121   RBC 5.52* 05/11/2014 1121   HGB 14.9 05/11/2014 1121   HCT 45.4 05/11/2014 1121   PLT 257.0 05/11/2014 1121   MCV 82.2 05/11/2014 1121   MCH 28.1 01/18/2010 0530   MCHC 32.9 05/11/2014 1121   RDW 15.3 05/11/2014 1121    Hgb A1C Lab Results  Component Value Date   HGBA1C 6.0 05/11/2014        Assessment & Plan:   Candida Vaginitis:  Wet prep: + yeast eRx for Diflucan 150 mg PO x 1, may repeat in 3 days if symptoms persist If symptoms persist, consider gyn referral for evaluation of possible lichen sclerosis  RTC in 6 months for follow up of chronic conditons

## 2014-10-08 NOTE — Assessment & Plan Note (Signed)
Continue Aleve OTC Discussed how knee pain can be improved with weight loss

## 2014-10-08 NOTE — Patient Instructions (Signed)
Fat and Cholesterol Control Diet Fat and cholesterol levels in your blood and organs are influenced by your diet. High levels of fat and cholesterol may lead to diseases of the heart, small and large blood vessels, gallbladder, liver, and pancreas. CONTROLLING FAT AND CHOLESTEROL WITH DIET Although exercise and lifestyle factors are important, your diet is key. That is because certain foods are known to raise cholesterol and others to lower it. The goal is to balance foods for their effect on cholesterol and more importantly, to replace saturated and trans fat with other types of fat, such as monounsaturated fat, polyunsaturated fat, and omega-3 fatty acids. On average, a person should consume no more than 15 to 17 g of saturated fat daily. Saturated and trans fats are considered "bad" fats, and they will raise LDL cholesterol. Saturated fats are primarily found in animal products such as meats, butter, and cream. However, that does not mean you need to give up all your favorite foods. Today, there are good tasting, low-fat, low-cholesterol substitutes for most of the things you like to eat. Choose low-fat or nonfat alternatives. Choose round or loin cuts of red meat. These types of cuts are lowest in fat and cholesterol. Chicken (without the skin), fish, veal, and ground turkey breast are great choices. Eliminate fatty meats, such as hot dogs and salami. Even shellfish have little or no saturated fat. Have a 3 oz (85 g) portion when you eat lean meat, poultry, or fish. Trans fats are also called "partially hydrogenated oils." They are oils that have been scientifically manipulated so that they are solid at room temperature resulting in a longer shelf life and improved taste and texture of foods in which they are added. Trans fats are found in stick margarine, some tub margarines, cookies, crackers, and baked goods.  When baking and cooking, oils are a great substitute for butter. The monounsaturated oils are  especially beneficial since it is believed they lower LDL and raise HDL. The oils you should avoid entirely are saturated tropical oils, such as coconut and palm.  Remember to eat a lot from food groups that are naturally free of saturated and trans fat, including fish, fruit, vegetables, beans, grains (barley, rice, couscous, bulgur wheat), and pasta (without cream sauces).  IDENTIFYING FOODS THAT LOWER FAT AND CHOLESTEROL  Soluble fiber may lower your cholesterol. This type of fiber is found in fruits such as apples, vegetables such as broccoli, potatoes, and carrots, legumes such as beans, peas, and lentils, and grains such as barley. Foods fortified with plant sterols (phytosterol) may also lower cholesterol. You should eat at least 2 g per day of these foods for a cholesterol lowering effect.  Read package labels to identify low-saturated fats, trans fat free, and low-fat foods at the supermarket. Select cheeses that have only 2 to 3 g saturated fat per ounce. Use a heart-healthy tub margarine that is free of trans fats or partially hydrogenated oil. When buying baked goods (cookies, crackers), avoid partially hydrogenated oils. Breads and muffins should be made from whole grains (whole-wheat or whole oat flour, instead of "flour" or "enriched flour"). Buy non-creamy canned soups with reduced salt and no added fats.  FOOD PREPARATION TECHNIQUES  Never deep-fry. If you must fry, either stir-fry, which uses very little fat, or use non-stick cooking sprays. When possible, broil, bake, or roast meats, and steam vegetables. Instead of putting butter or margarine on vegetables, use lemon and herbs, applesauce, and cinnamon (for squash and sweet potatoes). Use nonfat   yogurt, salsa, and low-fat dressings for salads.  LOW-SATURATED FAT / LOW-FAT FOOD SUBSTITUTES Meats / Saturated Fat (g)  Avoid: Steak, marbled (3 oz/85 g) / 11 g  Choose: Steak, lean (3 oz/85 g) / 4 g  Avoid: Hamburger (3 oz/85 g) / 7  g  Choose: Hamburger, lean (3 oz/85 g) / 5 g  Avoid: Ham (3 oz/85 g) / 6 g  Choose: Ham, lean cut (3 oz/85 g) / 2.4 g  Avoid: Chicken, with skin, dark meat (3 oz/85 g) / 4 g  Choose: Chicken, skin removed, dark meat (3 oz/85 g) / 2 g  Avoid: Chicken, with skin, light meat (3 oz/85 g) / 2.5 g  Choose: Chicken, skin removed, light meat (3 oz/85 g) / 1 g Dairy / Saturated Fat (g)  Avoid: Whole milk (1 cup) / 5 g  Choose: Low-fat milk, 2% (1 cup) / 3 g  Choose: Low-fat milk, 1% (1 cup) / 1.5 g  Choose: Skim milk (1 cup) / 0.3 g  Avoid: Hard cheese (1 oz/28 g) / 6 g  Choose: Skim milk cheese (1 oz/28 g) / 2 to 3 g  Avoid: Cottage cheese, 4% fat (1 cup) / 6.5 g  Choose: Low-fat cottage cheese, 1% fat (1 cup) / 1.5 g  Avoid: Ice cream (1 cup) / 9 g  Choose: Sherbet (1 cup) / 2.5 g  Choose: Nonfat frozen yogurt (1 cup) / 0.3 g  Choose: Frozen fruit bar / trace  Avoid: Whipped cream (1 tbs) / 3.5 g  Choose: Nondairy whipped topping (1 tbs) / 1 g Condiments / Saturated Fat (g)  Avoid: Mayonnaise (1 tbs) / 2 g  Choose: Low-fat mayonnaise (1 tbs) / 1 g  Avoid: Butter (1 tbs) / 7 g  Choose: Extra light margarine (1 tbs) / 1 g  Avoid: Coconut oil (1 tbs) / 11.8 g  Choose: Olive oil (1 tbs) / 1.8 g  Choose: Corn oil (1 tbs) / 1.7 g  Choose: Safflower oil (1 tbs) / 1.2 g  Choose: Sunflower oil (1 tbs) / 1.4 g  Choose: Soybean oil (1 tbs) / 2.4 g  Choose: Canola oil (1 tbs) / 1 g Document Released: 03/06/2005 Document Revised: 07/01/2012 Document Reviewed: 06/04/2013 [employer] Patient Information 2015 [employer]. This information is not intended to replace advice given to you by your health care provider. Make sure you discuss any questions you have with your health care provider.  

## 2014-10-08 NOTE — Addendum Note (Signed)
Addended by: Lurlean Nanny on: 10/08/2014 01:33 PM   Modules accepted: Orders

## 2014-10-08 NOTE — Assessment & Plan Note (Signed)
Discussed how weight low could improve her OSA She does not wear her CPAP

## 2014-10-08 NOTE — Assessment & Plan Note (Signed)
BP well controlled on Benazepril and HCTZ ECG today normal Will check CBC and CMET today

## 2014-10-08 NOTE — Assessment & Plan Note (Addendum)
Will check CMET and Lipid profile Will check CK given myalgias Continue red yeast rice for now Handout given on low fat diet

## 2014-10-08 NOTE — Assessment & Plan Note (Signed)
Encouraged her to work on diet and exercise 

## 2014-10-08 NOTE — Assessment & Plan Note (Signed)
No issues on current dose of Synthroid Will check TSH and Free T4 today

## 2014-10-08 NOTE — Progress Notes (Signed)
Pre visit review using our clinic review tool, if applicable. No additional management support is needed unless otherwise documented below in the visit note. 

## 2014-10-09 MED ORDER — SIMVASTATIN 20 MG PO TABS: 20.0000 mg | ORAL_TABLET | Freq: Every day | ORAL | Status: DC

## 2014-10-09 NOTE — Addendum Note (Signed)
Addended by: Lurlean Nanny on: 10/09/2014 04:27 PM   Modules accepted: Orders

## 2014-10-09 NOTE — Addendum Note (Signed)
Addended by: Lurlean Nanny on: 10/09/2014 01:55 PM   Modules accepted: Orders

## 2014-11-04 ENCOUNTER — Other Ambulatory Visit: Payer: Self-pay

## 2014-11-04 MED ORDER — LEVOTHYROXINE SODIUM 100 MCG PO CAPS: 1.0000 | ORAL_CAPSULE | Freq: Every day | ORAL | Status: DC

## 2014-11-04 NOTE — Telephone Encounter (Signed)
Pt left v/m requesting refill of levothyroxine to [facility] outpt pharmacy. Pt last seen 10/08/14. Avie Echevaria NP has not filled before.Please advise. Pt request cb.

## 2014-11-17 ENCOUNTER — Telehealth: Payer: Self-pay

## 2014-11-17 NOTE — Telephone Encounter (Signed)
I left a message for patient to remind her of being due for a Mammogram. I notified patient that if she needed information on how/where to get one scheduled, she could return call to the office.

## 2015-01-11 ENCOUNTER — Ambulatory Visit: Payer: 59 | Admitting: Internal Medicine

## 2015-01-11 DIAGNOSIS — Z0289 Encounter for other administrative examinations: Secondary | ICD-10-CM

## 2015-02-18 ENCOUNTER — Encounter: Payer: Self-pay | Admitting: Internal Medicine

## 2015-02-18 ENCOUNTER — Ambulatory Visit (INDEPENDENT_AMBULATORY_CARE_PROVIDER_SITE_OTHER): Payer: 59 | Admitting: Internal Medicine

## 2015-02-18 VITALS — BP 142/88 | HR 65 | Temp 97.9°F | Wt 274.0 lb

## 2015-02-18 DIAGNOSIS — G4733 Obstructive sleep apnea (adult) (pediatric): Secondary | ICD-10-CM

## 2015-02-18 DIAGNOSIS — M1990 Unspecified osteoarthritis, unspecified site: Secondary | ICD-10-CM | POA: Diagnosis not present

## 2015-02-18 DIAGNOSIS — I10 Essential (primary) hypertension: Secondary | ICD-10-CM

## 2015-02-18 DIAGNOSIS — E039 Hypothyroidism, unspecified: Secondary | ICD-10-CM

## 2015-02-18 DIAGNOSIS — R7303 Prediabetes: Secondary | ICD-10-CM

## 2015-02-18 DIAGNOSIS — E785 Hyperlipidemia, unspecified: Secondary | ICD-10-CM

## 2015-02-18 MED ORDER — SIMVASTATIN 20 MG PO TABS: 20.0000 mg | ORAL_TABLET | Freq: Every day | ORAL | Status: DC

## 2015-02-18 NOTE — Progress Notes (Signed)
Pre visit review using our clinic review tool, if applicable. No additional management support is needed unless otherwise documented below in the visit note. 

## 2015-02-18 NOTE — Assessment & Plan Note (Signed)
Referral to pulm for sleep study Discussed how weight loss could help her

## 2015-02-18 NOTE — Progress Notes (Signed)
Subjective:    Patient ID: Judith Ross, female    DOB: 1955-05-26, 60 y.o.   MRN: [id number]  HPI  Pt presents to the clinic today for 6 month follow up of chronic conditions.  Arthritis: Mainly in both of her knees. Her lower back has also been bothering her. She knows this is exacerbated by her weight. She does take Aleve OTC as needed, without much relief. She also uses Aspercreme.  HTN: She takes Benzepril and HCTZ. Her BP today is 142/92. She reports her knees are bothering her and that why she thinks her BP is elevated. She denies chest pain. ECG from 09/2014 reviewed.  HLD: We started her on Zocor because she was having muscle aches on Crestor. She reports she stopped the Zocor because it caused vaginal itching. She does not consume a low fat diet.  OSA: She does not use a CPAP machine. She wants a referral for a sleep study.  Obesity: She has gained 6 lbs since her last visit. Her BMI is 44.25. She does not exercise.  Prediabetes: Her last A1C was 5.9% (09/2014). She denies increased thirst, frequency, or numbness/tingling in hands or feet.  Hypothyroidism: She denies weight gain, hair loss, abnormal cold sensation, fatigue or consitpation. She is taking her Synthroid as prescribed.  She also c/o vaginal itching. It is on the inside not the outside. No discharge. She is not sexually active. She denies urinary symptoms. She does not douche. She uses scented antibacterial soap. She is not sexually active.  Review of Systems      Past Medical History  Diagnosis Date  . Hyperlipidemia   . Hypertension   . Sleep apnea   . Obesity   . Thyroid disease   . Arthritis   . Chicken pox     Current Outpatient Prescriptions  Medication Sig Dispense Refill  . benazepril (LOTENSIN) 10 MG tablet Take 10 mg by mouth daily.    . fluconazole (DIFLUCAN) 150 MG tablet Take 1 tablet (150 mg total) by mouth once. 2 tablet 0  . hydrochlorothiazide (HYDRODIURIL) 25 MG tablet Take 25 mg  by mouth daily.    . Levothyroxine Sodium 100 MCG CAPS Take 1 capsule (100 mcg total) by mouth daily. 30 capsule 5  . rosuvastatin (CRESTOR) 10 MG tablet Take 10 mg by mouth daily.    . simvastatin (ZOCOR) 20 MG tablet Take 1 tablet (20 mg total) by mouth at bedtime. 30 tablet 2   No current facility-administered medications for this visit.    Allergies  Allergen Reactions  . Erythromycin Itching    Family History  Problem Relation Age of Onset  . Stroke Father   . Diabetes Maternal Uncle   . Diabetes Maternal Grandmother   . Hypertension Maternal Grandmother   . Cancer Maternal Aunt     Breast    Social History   Social History  . Marital Status: Divorced    Spouse Name: N/A  . Number of Children: N/A  . Years of Education: N/A   Occupational History  . Not on file.   Social History Main Topics  . Smoking status: Never Smoker   . Smokeless tobacco: Not on file  . Alcohol Use: No  . Drug Use: No  . Sexual Activity: Yes   Other Topics Concern  . Not on file   Social History Narrative     Constitutional: Denies fever, malaise, fatigue, headache or abrupt weight changes.  HEENT: Denies eye pain, eye redness, ear  pain, ringing in the ears, wax buildup, runny nose, nasal congestion, bloody nose, or sore throat. Respiratory: Denies difficulty breathing, shortness of breath, cough or sputum production.   Cardiovascular: Denies chest pain, chest tightness, palpitations or swelling in the hands or feet.  Gastrointestinal: Denies abdominal pain, bloating, constipation, diarrhea or blood in the stool.  GU: Pt reports vaginal itching. Denies urgency, frequency, pain with urination, burning sensation, blood in urine, odor or discharge. Musculoskeletal: Pt reports bilateral knee pain. Denies decrease in range of motion, difficulty with gait, muscle pain or joint swelling.  Skin: Denies redness, rashes, lesions or ulcercations.  Neurological: Denies dizziness, difficulty with  memory, difficulty with speech or problems with balance and coordination.  Psych: Denies anxiety, depression, SI/HI.  No other specific complaints in a complete review of systems (except as listed in HPI above).  Objective:   Physical Exam    BP 142/88 mmHg  Pulse 65  Temp(Src) 97.9 F (36.6 C) (Oral)  Wt 274 lb (124.286 kg)  SpO2 98% Wt Readings from Last 3 Encounters:  02/18/15 274 lb (124.286 kg)  10/08/14 268 lb (121.564 kg)  05/11/14 278 lb (126.1 kg)    General: Appears her stated age, obese in NAD. Skin: Warm, dry and intact.  HEENT: Head: normal shape and size; Eyes: sclera white, no icterus, conjunctiva pink, PERRLA and EOMs intact;  Neck:  Neck supple, trachea midline. No masses, lumps or thyromegaly present.  Cardiovascular: Normal rate and rhythm. S1,S2 noted.  No murmur, rubs or gallops noted. No JVD or BLE edema. No carotid bruits noted. Pulmonary/Chest: Normal effort and positive vesicular breath sounds. No respiratory distress. No wheezes, rales or ronchi noted.  Abdomen: Soft and nontender. Normal bowel sounds. No distention or masses noted.  Pelvic: Normal female anatomy. Hypopigmentation noted of right labia and in between gluteal folds. Small amount of thin white discharge noted. Musculoskeletal: Normal flexion, extension of the knees. No signs of joint swelling. No difficulty with gait.  Neurological: Alert and oriented.  Psychiatric: Mood and affect normal. Behavior is normal. Judgment and thought content normal.     BMET    Component Value Date/Time   NA 138 10/08/2014 1057   K 3.9 10/08/2014 1057   CL 99 10/08/2014 1057   CO2 32 10/08/2014 1057   GLUCOSE 97 10/08/2014 1057   BUN 22 10/08/2014 1057   CREATININE 0.96 10/08/2014 1057   CALCIUM 9.1 10/08/2014 1057   GFRNONAA >60 01/10/2010 0915   GFRAA  01/10/2010 0915    >60        The eGFR has been calculated using the MDRD equation. This calculation has not been validated in all  clinical situations. eGFR's persistently <60 mL/min signify possible Chronic Kidney Disease.    Lipid Panel     Component Value Date/Time   CHOL 261* 10/08/2014 1057   TRIG 85.0 10/08/2014 1057   HDL 66.00 10/08/2014 1057   CHOLHDL 4 10/08/2014 1057   VLDL 17.0 10/08/2014 1057   LDLCALC 178* 10/08/2014 1057    CBC    Component Value Date/Time   WBC 8.4 10/08/2014 1057   RBC 5.49* 10/08/2014 1057   HGB 15.0 10/08/2014 1057   HCT 46.6* 10/08/2014 1057   PLT 296.0 10/08/2014 1057   MCV 84.9 10/08/2014 1057   MCH 28.1 01/18/2010 0530   MCHC 32.2 10/08/2014 1057   RDW 14.7 10/08/2014 1057    Hgb A1C Lab Results  Component Value Date   HGBA1C 5.9 10/08/2014  Assessment & Plan:   Vaginal itching:  Wet prep: - yeast, - clue Make an appt with GYN to discuss treatment of lichen sclerosis  RTC in 3 months for annual exam/follow up

## 2015-02-18 NOTE — Assessment & Plan Note (Signed)
Thyroid labs from 3 months ago reviewed Continue your current dose of Synthroid

## 2015-02-18 NOTE — Patient Instructions (Addendum)
I want you to start taking your Zocor daily and consume a low fat diet I also want you to take a baby ASA daily I want you to make an appt with GYN to discuss evaluation of possible lichen sclerosis  Fat and Cholesterol Restricted Diet High levels of fat and cholesterol in your blood may lead to various health problems, such as diseases of the heart, blood vessels, gallbladder, liver, and pancreas. Fats are concentrated sources of energy that come in various forms. Certain types of fat, including saturated fat, may be harmful in excess. Cholesterol is a substance needed by your body in small amounts. Your body makes all the cholesterol it needs. Excess cholesterol comes from the food you eat. When you have high levels of cholesterol and saturated fat in your blood, health problems can develop because the excess fat and cholesterol will gather along the walls of your blood vessels, causing them to narrow. Choosing the right foods will help you control your intake of fat and cholesterol. This will help keep the levels of these substances in your blood within normal limits and reduce your risk of disease. WHAT IS MY PLAN? Your health care provider recommends that you:  Get no more than __________ % of the total calories in your daily diet from fat.  Limit your intake of saturated fat to less than ______% of your total calories each day.  Limit the amount of cholesterol in your diet to less than _________mg per day. WHAT TYPES OF FAT SHOULD I CHOOSE?  Choose healthy fats more often. Choose monounsaturated and polyunsaturated fats, such as olive and canola oil, flaxseeds, walnuts, almonds, and seeds.  Eat more omega-3 fats. Good choices include salmon, mackerel, sardines, tuna, flaxseed oil, and ground flaxseeds. Aim to eat fish at least two times a week.  Limit saturated fats. Saturated fats are primarily found in animal products, such as meats, butter, and cream. Plant sources of saturated fats  include palm oil, palm kernel oil, and coconut oil.  Avoid foods with partially hydrogenated oils in them. These contain trans fats. Examples of foods that contain trans fats are stick margarine, some tub margarines, cookies, crackers, and other baked goods. WHAT GENERAL GUIDELINES DO I NEED TO FOLLOW? These guidelines for healthy eating will help you control your intake of fat and cholesterol:  Check food labels carefully to identify foods with trans fats or high amounts of saturated fat.  Fill one half of your plate with vegetables and green salads.  Fill one fourth of your plate with whole grains. Look for the word "whole" as the first word in the ingredient list.  Fill one fourth of your plate with lean protein foods.  Limit fruit to two servings a day. Choose fruit instead of juice.  Eat more foods that contain soluble fiber. Examples of foods that contain this type of fiber are apples, broccoli, carrots, beans, peas, and barley. Aim to get 20-30 g of fiber per day.  Eat more home-cooked food and less restaurant, buffet, and fast food.  Limit or avoid alcohol.  Limit foods high in starch and sugar.  Limit fried foods.  Cook foods using methods other than frying. Baking, boiling, grilling, and broiling are all great options.  Lose weight if you are overweight. Losing just 5-10% of your initial body weight can help your overall health and prevent diseases such as diabetes and heart disease. WHAT FOODS CAN I EAT? Grains Whole grains, such as whole wheat or whole grain breads,  crackers, cereals, and pasta. Unsweetened oatmeal, bulgur, barley, quinoa, or brown rice. Corn or whole wheat flour tortillas. Vegetables Fresh or frozen vegetables (raw, steamed, roasted, or grilled). Green salads. Fruits All fresh, canned (in natural juice), or frozen fruits. Meat and Other Protein Products Ground beef (85% or leaner), grass-fed beef, or beef trimmed of fat. Skinless chicken or [place].  Ground chicken or [place]. Pork trimmed of fat. All fish and seafood. Eggs. Dried beans, peas, or lentils. Unsalted nuts or seeds. Unsalted canned or dry beans. Dairy Low-fat dairy products, such as skim or 1% milk, 2% or reduced-fat cheeses, low-fat ricotta or cottage cheese, or plain low-fat yogurt. Fats and Oils Tub margarines without trans fats. Light or reduced-fat mayonnaise and salad dressings. Avocado. Olive, canola, sesame, or safflower oils. Natural peanut or almond butter (choose ones without added sugar and oil). The items listed above may not be a complete list of recommended foods or beverages. Contact your dietitian for more options. WHAT FOODS ARE NOT RECOMMENDED? Grains White bread. White pasta. White rice. Cornbread. Bagels, pastries, and croissants. Crackers that contain trans fat. Vegetables White potatoes. Corn. Creamed or fried vegetables. Vegetables in a cheese sauce. Fruits Dried fruits. Canned fruit in light or heavy syrup. Fruit juice. Meat and Other Protein Products Fatty cuts of meat. Ribs, chicken wings, bacon, sausage, bologna, salami, chitterlings, fatback, hot dogs, bratwurst, and packaged luncheon meats. Liver and organ meats. Dairy Whole or 2% milk, cream, half-and-half, and cream cheese. Whole milk cheeses. Whole-fat or sweetened yogurt. Full-fat cheeses. Nondairy creamers and whipped toppings. Processed cheese, cheese spreads, or cheese curds. Sweets and Desserts Corn syrup, sugars, honey, and molasses. Candy. Jam and jelly. Syrup. Sweetened cereals. Cookies, pies, cakes, donuts, muffins, and ice cream. Fats and Oils Butter, stick margarine, lard, shortening, ghee, or bacon fat. Coconut, palm kernel, or palm oils. Beverages Alcohol. Sweetened drinks (such as sodas, lemonade, and fruit drinks or punches). The items listed above may not be a complete list of foods and beverages to avoid. Contact your dietitian for more information.   This information is not  intended to replace advice given to you by your health care provider. Make sure you discuss any questions you have with your health care provider.   Document Released: 03/06/2005 Document Revised: 03/27/2014 Document Reviewed: 06/04/2013 Elsevier Interactive Patient Education [employer].

## 2015-02-18 NOTE — Assessment & Plan Note (Signed)
Xray of bilateral knees today Discussed how weight loss could help improve her joint pain

## 2015-02-18 NOTE — Assessment & Plan Note (Signed)
Advised her to restart Zocor, this is not causing her vaginal itching Advised her to consume a low fat diet Start taking a baby ASA daily  Will recheck CMET and Lipid profile in 3 months

## 2015-02-18 NOTE — Assessment & Plan Note (Signed)
Encouraged her to work on diet and exercise 

## 2015-02-18 NOTE — Assessment & Plan Note (Signed)
BP elevated today Will continue HCTZ and Benazepril

## 2015-02-18 NOTE — Assessment & Plan Note (Signed)
Will follow A1C Encouraged her to consume a low carb, low fat diet

## 2015-04-01 MED FILL — SIMVASTATIN 20 MG TABLET: 20 | 30 days supply | Qty: 30 | Fill #2

## 2015-04-09 ENCOUNTER — Other Ambulatory Visit: Payer: Self-pay

## 2015-04-09 ENCOUNTER — Telehealth: Payer: Self-pay

## 2015-04-09 MED ORDER — BENAZEPRIL HCL 10 MG PO TABS: 10.0000 mg | ORAL_TABLET | Freq: Every day | ORAL | Status: DC

## 2015-04-09 MED ORDER — HYDROCHLOROTHIAZIDE 25 MG PO TABS: 25.0000 mg | ORAL_TABLET | Freq: Every day | ORAL | Status: DC

## 2015-04-09 MED FILL — BENAZEPRIL HCL 10 MG TABLET: 10 | 90 days supply | Qty: 90 | Fill #0

## 2015-04-09 NOTE — Telephone Encounter (Signed)
Left detailed msg on VM per HIPAA Asking pt to confirm what blood pressure medication she is currently taking as the pharmacy is insisting the Amlodipine-Benazepril combo is medication they have on file previously unless she had Rx filled somewhere else

## 2015-04-09 NOTE — Telephone Encounter (Signed)
As far as Im aware, its should be just benazepril

## 2015-04-09 NOTE — Telephone Encounter (Signed)
Elizabeth at [facility] left v/m to verify that pts med was changed;[facility] pharmacy had faxed refill request for amlodipine benazepril; previously pt had been getting amlodipine benazapril. Elizabeth request confirmation that pt was changed from combination amlodipine benazepril to benazepril alone. I do not see amlodipine benazepril on current or hx med list.Please advise.

## 2015-04-12 MED FILL — HYDROCHLOROTHIAZIDE 25 MG T: 25 | 90 days supply | Qty: 90 | Fill #0

## 2015-04-12 NOTE — Telephone Encounter (Signed)
Judith Ross left v/m requesting cb about BP med; [name] spoke with pt and pt is not sure what she is taking and previous physician has changed offices and does not have access to records.Please advise.

## 2015-04-12 NOTE — Telephone Encounter (Signed)
Spoke to Patterson at pharmacy per [facility] instruction keep HCTZ and Benazapril d/c combo amlodipine/benazepril

## 2015-04-12 NOTE — Telephone Encounter (Signed)
Please refill only what med list states

## 2015-05-19 ENCOUNTER — Other Ambulatory Visit: Payer: Self-pay | Admitting: Internal Medicine

## 2015-05-19 MED FILL — SIMVASTATIN 20 MG TABLET: 20 | 30 days supply | Qty: 30 | Fill #0

## 2015-05-19 MED FILL — LEVOTHYROXINE 100 MCG TAB: 100 | 60 days supply | Qty: 60 | Fill #0

## 2015-05-24 ENCOUNTER — Encounter: Payer: 59 | Admitting: Internal Medicine

## 2015-05-24 ENCOUNTER — Encounter: Payer: Self-pay | Admitting: Internal Medicine

## 2015-05-24 ENCOUNTER — Ambulatory Visit
Admission: RE | Admit: 2015-05-24 | Discharge: 2015-05-24 | Disposition: A | Discharge status: Home / Self Care | Payer: 59 | Source: Ambulatory Visit | Attending: Internal Medicine | Admitting: Internal Medicine

## 2015-05-24 ENCOUNTER — Ambulatory Visit: Payer: 59 | Admitting: Internal Medicine

## 2015-05-24 ENCOUNTER — Ambulatory Visit (INDEPENDENT_AMBULATORY_CARE_PROVIDER_SITE_OTHER): Payer: 59 | Admitting: Internal Medicine

## 2015-05-24 ENCOUNTER — Ambulatory Visit (INDEPENDENT_AMBULATORY_CARE_PROVIDER_SITE_OTHER)
Admission: RE | Admit: 2015-05-24 | Discharge: 2015-05-24 | Disposition: A | Discharge status: Home / Self Care | Payer: 59 | Source: Ambulatory Visit | Attending: Internal Medicine | Admitting: Internal Medicine

## 2015-05-24 VITALS — BP 150/98 | HR 67 | Temp 98.9°F | Ht 66.0 in | Wt 274.0 lb

## 2015-05-24 DIAGNOSIS — M25561 Pain in right knee: Secondary | ICD-10-CM

## 2015-05-24 DIAGNOSIS — M179 Osteoarthritis of knee, unspecified: Secondary | ICD-10-CM | POA: Diagnosis not present

## 2015-05-24 DIAGNOSIS — M25562 Pain in left knee: Secondary | ICD-10-CM | POA: Diagnosis not present

## 2015-05-24 DIAGNOSIS — E785 Hyperlipidemia, unspecified: Secondary | ICD-10-CM

## 2015-05-24 DIAGNOSIS — Z0000 Encounter for general adult medical examination without abnormal findings: Secondary | ICD-10-CM | POA: Diagnosis not present

## 2015-05-24 DIAGNOSIS — Z0001 Encounter for general adult medical examination with abnormal findings: Secondary | ICD-10-CM | POA: Diagnosis not present

## 2015-05-24 DIAGNOSIS — I10 Essential (primary) hypertension: Secondary | ICD-10-CM | POA: Diagnosis not present

## 2015-05-24 DIAGNOSIS — E039 Hypothyroidism, unspecified: Secondary | ICD-10-CM | POA: Diagnosis not present

## 2015-05-24 LAB — COMPREHENSIVE METABOLIC PANEL
ALK PHOS: 83 U/L (ref 39–117)
ALT: 23 U/L (ref 0–35)
AST: 19 U/L (ref 0–37)
Albumin: 3.9 g/dL (ref 3.5–5.2)
BILIRUBIN TOTAL: 0.4 mg/dL (ref 0.2–1.2)
BUN: 13 mg/dL (ref 6–23)
CO2: 30 mEq/L (ref 19–32)
Calcium: 8.8 mg/dL (ref 8.4–10.5)
Chloride: 103 mEq/L (ref 96–112)
Creatinine, Ser: 0.92 mg/dL (ref 0.40–1.20)
GFR: 79.98 mL/min (ref >60.00)
GLUCOSE: 88 mg/dL (ref 70–99)
Potassium: 4.1 mEq/L (ref 3.5–5.1)
Sodium: 140 mEq/L (ref 135–145)
TOTAL PROTEIN: 7.2 g/dL (ref 6.0–8.3)

## 2015-05-24 LAB — LIPID PANEL
Cholesterol: 234 mg/dL — ABNORMAL HIGH (ref 0–200)
HDL: 60.2 mg/dL (ref >39.00)
LDL Cholesterol: 156 mg/dL — ABNORMAL HIGH (ref 0–99)
NONHDL: 173.38
Total CHOL/HDL Ratio: 4
Triglycerides: 85 mg/dL (ref 0.0–149.0)
VLDL: 17 mg/dL (ref 0.0–40.0)

## 2015-05-24 LAB — CBC
HEMATOCRIT: 45 % (ref 36.0–46.0)
Hemoglobin: 14.9 g/dL (ref 12.0–15.0)
MCHC: 33.2 g/dL (ref 30.0–36.0)
MCV: 83.6 fl (ref 78.0–100.0)
Platelets: 289 10*3/uL (ref 150.0–400.0)
RBC: 5.38 Mil/uL — AB (ref 3.87–5.11)
RDW: 14.3 % (ref 11.5–15.5)
WBC: 6.5 10*3/uL (ref 4.0–10.5)

## 2015-05-24 LAB — TSH: TSH: 7.14 u[IU]/mL — ABNORMAL HIGH (ref 0.35–4.50)

## 2015-05-24 LAB — T4, FREE: FREE T4: 0.82 ng/dL (ref 0.60–1.60)

## 2015-05-24 LAB — HEMOGLOBIN A1C: HEMOGLOBIN A1C: 6 % (ref 4.6–6.5)

## 2015-05-24 MED ORDER — LEVOTHYROXINE SODIUM 100 MCG PO TABS: 100.0000 ug | ORAL_TABLET | Freq: Every day | ORAL | Status: DC

## 2015-05-24 MED ORDER — SIMVASTATIN 20 MG PO TABS: 20.0000 mg | ORAL_TABLET | Freq: Every day | ORAL | Status: DC

## 2015-05-24 NOTE — Progress Notes (Signed)
Pre visit review using our clinic review tool, if applicable. No additional management support is needed unless otherwise documented below in the visit note. 

## 2015-05-24 NOTE — Assessment & Plan Note (Signed)
Encouraged her to work on diet and exercise 

## 2015-05-24 NOTE — Patient Instructions (Signed)

## 2015-05-24 NOTE — Assessment & Plan Note (Signed)
Will check TSH and T4 today Synthroid refilled

## 2015-05-24 NOTE — Assessment & Plan Note (Signed)
Will check CMET and Lipid profile today Encouraged her to consume a low fat diet 

## 2015-05-24 NOTE — Progress Notes (Signed)
Subjective:    Patient ID: Judith Ross, female    DOB: 01-09-1956, 60 y.o.   MRN: [id number]  HPI  Pt presents to the clinic today for her annual exam.  Flu: 12/2014  Tetanus: 09/2014 Zostovax: never Mammogram: 2002 Pap Smear: 2010, s/p hysterectomy Colon Screening: never Vision Screening: 04/2014 Dentist: as needed  Diet: She does eat meat. She consumes fruits and veggies daily. She does eat some fried foods. She drinks mostly soda, tea. She does drink some water. Exercise: None.  HLD: Her last LDL was 178. She was started on Zocor 20 mg daily and advised to come back 12/2014 to have lipids and liver enzymes rechecked. She never returned for her lab appt. She reports she did not take the Zocor because it caused vaginal itching. She was advised 02/18/2015 to restart her Zocor and consume a low fat diet. She denies myalgias.  HTN: Her blood pressure is elevated this morning. 150/98. She told the CMA that she had already taken her BP medication this morning. She tells me that she has been out of all her medications for the last 4 days. She denies chest pain, tightness or shortness of breath.  She also reports bilateral knee pain. She was supposed to get xrays of her bilateral knees at her last visit, but left without getting it done. Her knees have bothered her for 5 years. She has pain with walking, better with sitting. They do swell occassionally. She will take Aleve or Ibuprofen as needed.  Review of Systems      Past Medical History  Diagnosis Date  . Hyperlipidemia   . Hypertension   . Sleep apnea   . Obesity   . Thyroid disease   . Arthritis   . Chicken pox     Current Outpatient Prescriptions  Medication Sig Dispense Refill  . benazepril (LOTENSIN) 10 MG tablet Take 1 tablet (10 mg total) by mouth daily. SCHEDULE ANNUAL EXAM FOR March 2017 30 tablet 2  . hydrochlorothiazide (HYDRODIURIL) 25 MG tablet Take 1 tablet (25 mg total) by mouth daily. SCHEDULE ANNUAL EXAM  FOR March 2017 30 tablet 2  . levothyroxine (SYNTHROID, LEVOTHROID) 100 MCG tablet Take 1 tablet (100 mcg total) by mouth daily. MUST SCHEDULE ANNUAL PHYSICAL 30 tablet 1  . simvastatin (ZOCOR) 20 MG tablet Take 1 tablet (20 mg total) by mouth at bedtime. 30 tablet 2   No current facility-administered medications for this visit.    Allergies  Allergen Reactions  . Erythromycin Itching    Family History  Problem Relation Age of Onset  . Stroke Father   . Diabetes Maternal Uncle   . Diabetes Maternal Grandmother   . Hypertension Maternal Grandmother   . Cancer Maternal Aunt     Breast    Social History   Social History  . Marital Status: Divorced    Spouse Name: N/A  . Number of Children: N/A  . Years of Education: N/A   Occupational History  . Not on file.   Social History Main Topics  . Smoking status: Never Smoker   . Smokeless tobacco: Not on file  . Alcohol Use: No  . Drug Use: No  . Sexual Activity: Yes   Other Topics Concern  . Not on file   Social History Narrative     Constitutional: Denies fever, malaise, fatigue, headache or abrupt weight changes.  HEENT: Denies eye pain, eye redness, ear pain, ringing in the ears, wax buildup, runny nose, nasal congestion, bloody  nose, or sore throat. Respiratory: Denies difficulty breathing, shortness of breath, cough or sputum production.   Cardiovascular: Denies chest pain, chest tightness, palpitations or swelling in the hands or feet.  Gastrointestinal: Denies abdominal pain, bloating, constipation, diarrhea or blood in the stool.  GU: Denies urgency, frequency, pain with urination, burning sensation, blood in urine, odor or discharge. Musculoskeletal: Pt reports bilateral knee pain. Denies decrease in range of motion, difficulty with gait, muscle pain or joint swelling.  Skin: Denies redness, rashes, lesions or ulcercations.  Neurological: Denies dizziness, difficulty with memory, difficulty with speech or  problems with balance and coordination.  Psych: Denies anxiety, depression, SI/HI.  No other specific complaints in a complete review of systems (except as listed in HPI above).  Objective:   Physical Exam   BP 150/98 mmHg  Pulse 67  Temp(Src) 98.9 F (37.2 C) (Oral)  Ht _0  (1.676 m)  Wt 274 lb (124.286 kg)  BMI 44.25 kg/m2  SpO2 98% Wt Readings from Last 3 Encounters:  05/24/15 274 lb (124.286 kg)  02/18/15 274 lb (124.286 kg)  10/08/14 268 lb (121.564 kg)    General: Appears her stated age, obese in NAD. Skin: Warm, dry and intact.  HEENT: Head: normal shape and size; Eyes: sclera white, no icterus, conjunctiva pink, PERRLA and EOMs intact; Ears: Tm's gray and intact, normal light reflex; Throat/Mouth: Teeth present, mucosa pink and moist, no exudate, lesions or ulcerations noted.  Neck:  Neck supple, trachea midline. No masses, lumps or thyromegaly present.  Cardiovascular: Normal rate and rhythm. S1,S2 noted.  No murmur, rubs or gallops noted. No JVD or BLE edema. No carotid bruits noted. Pulmonary/Chest: Normal effort and positive vesicular breath sounds. No respiratory distress. No wheezes, rales or ronchi noted.  Abdomen: Soft and nontender. Normal bowel sounds. No distention or masses noted. Liver, spleen and kidneys non palpable. Musculoskeletal: Normal flexion and extension of the knees. Strength 5/5 BUE/BLE. No signs of joint swelling. No difficulty with gait.  Neurological: Alert and oriented. Cranial nerves II-XII grossly intact. Coordination normal.  Psychiatric: Mood and affect normal. Behavior is normal. Judgment and thought content normal.     BMET    Component Value Date/Time   NA 138 10/08/2014 1057   K 3.9 10/08/2014 1057   CL 99 10/08/2014 1057   CO2 32 10/08/2014 1057   GLUCOSE 97 10/08/2014 1057   BUN 22 10/08/2014 1057   CREATININE 0.96 10/08/2014 1057   CALCIUM 9.1 10/08/2014 1057   GFRNONAA >60 01/10/2010 0915   GFRAA  01/10/2010 0915     >60        The eGFR has been calculated using the MDRD equation. This calculation has not been validated in all clinical situations. eGFR's persistently <60 mL/min signify possible Chronic Kidney Disease.    Lipid Panel     Component Value Date/Time   CHOL 261* 10/08/2014 1057   TRIG 85.0 10/08/2014 1057   HDL 66.00 10/08/2014 1057   CHOLHDL 4 10/08/2014 1057   VLDL 17.0 10/08/2014 1057   LDLCALC 178* 10/08/2014 1057    CBC    Component Value Date/Time   WBC 8.4 10/08/2014 1057   RBC 5.49* 10/08/2014 1057   HGB 15.0 10/08/2014 1057   HCT 46.6* 10/08/2014 1057   PLT 296.0 10/08/2014 1057   MCV 84.9 10/08/2014 1057   MCH 28.1 01/18/2010 0530   MCHC 32.2 10/08/2014 1057   RDW 14.7 10/08/2014 1057    Hgb A1C Lab Results  Component Value Date  HGBA1C 5.9 10/08/2014        Assessment & Plan:   Preventative Health Maintenance:  Flu and Tetanus UTD She will contact her insurance company to see about Zostovax She declines mammogram/breast exam today She refuses pelvic exam today She declines colon screening- Ifob, cologuard or colonoscopy Encouraged her to consume a balanced diet and start an exercise regimen Encouraged her to see an eye doctor and dentist annually  Bilateral knee pain:  Exacerbated by obesity Xray bilateral knees today Aleve daily as needed for joint pain  RTC in 6 months to follow up chronic conditions

## 2015-05-24 NOTE — Assessment & Plan Note (Signed)
Questionable medication compliance Will continue current regimen at this time, will adjust at next visit as needed

## 2015-07-19 MED FILL — SIMVASTATIN 20 MG TABLET: 20 | 30 days supply | Qty: 30 | Fill #1

## 2015-07-19 MED FILL — LEVOTHYROXINE 100 MCG TAB: 100 | 90 days supply | Qty: 90 | Fill #0

## 2015-08-20 DIAGNOSIS — L298 Other pruritus: Secondary | ICD-10-CM | POA: Diagnosis not present

## 2015-08-20 DIAGNOSIS — L900 Lichen sclerosus et atrophicus: Secondary | ICD-10-CM | POA: Diagnosis not present

## 2015-08-24 MED FILL — CLOBETASOL 0.05% OINTMENT: 0.05 | 15 days supply | Qty: 45 | Fill #0

## 2015-08-31 MED FILL — TERCONAZOLE 0.4% VAG CREAM: 0.4 | 7 days supply | Qty: 45 | Fill #0

## 2015-09-17 DIAGNOSIS — R5383 Other fatigue: Secondary | ICD-10-CM | POA: Diagnosis not present

## 2015-09-17 DIAGNOSIS — Z79899 Other long term (current) drug therapy: Secondary | ICD-10-CM | POA: Diagnosis not present

## 2015-09-17 DIAGNOSIS — R0602 Shortness of breath: Secondary | ICD-10-CM | POA: Diagnosis not present

## 2015-09-17 DIAGNOSIS — R635 Abnormal weight gain: Secondary | ICD-10-CM | POA: Diagnosis not present

## 2015-09-27 DIAGNOSIS — R635 Abnormal weight gain: Secondary | ICD-10-CM | POA: Diagnosis not present

## 2015-09-27 MED FILL — PHENTERMINE 37.5 MG TABLET: 37.5 | 30 days supply | Qty: 30 | Fill #0

## 2015-09-28 DIAGNOSIS — L280 Lichen simplex chronicus: Secondary | ICD-10-CM | POA: Diagnosis not present

## 2015-09-29 MED FILL — SIMVASTATIN 20 MG TABLET: 20 | 30 days supply | Qty: 30 | Fill #2

## 2015-10-19 ENCOUNTER — Other Ambulatory Visit: Payer: Self-pay | Admitting: Internal Medicine

## 2015-10-20 MED ORDER — HYDROCHLOROTHIAZIDE 25 MG PO TABS: 25.0000 mg | ORAL_TABLET | Freq: Every day | ORAL | 0 refills | Status: DC

## 2015-10-20 MED FILL — HYDROCHLOROTHIAZIDE 25 MG T: 25 | 90 days supply | Qty: 90 | Fill #0

## 2015-10-20 MED FILL — BENAZEPRIL HCL 10 MG TABLET: 10 | 90 days supply | Qty: 90 | Fill #0

## 2015-10-28 DIAGNOSIS — R635 Abnormal weight gain: Secondary | ICD-10-CM | POA: Diagnosis not present

## 2015-10-28 MED FILL — TOPIRAMATE 25 MG TABLET: 25 | 30 days supply | Qty: 60 | Fill #0

## 2015-11-01 MED FILL — LEVOTHYROXINE 100 MCG TAB: 100 | 90 days supply | Qty: 90 | Fill #1

## 2015-12-21 ENCOUNTER — Encounter: Payer: Self-pay | Admitting: Internal Medicine

## 2015-12-21 ENCOUNTER — Ambulatory Visit (INDEPENDENT_AMBULATORY_CARE_PROVIDER_SITE_OTHER): Payer: 59 | Admitting: Internal Medicine

## 2015-12-21 VITALS — BP 148/98 | HR 66 | Temp 98.6°F | Wt 268.5 lb

## 2015-12-21 DIAGNOSIS — I10 Essential (primary) hypertension: Secondary | ICD-10-CM

## 2015-12-21 DIAGNOSIS — M1990 Unspecified osteoarthritis, unspecified site: Secondary | ICD-10-CM

## 2015-12-21 DIAGNOSIS — E039 Hypothyroidism, unspecified: Secondary | ICD-10-CM | POA: Diagnosis not present

## 2015-12-21 DIAGNOSIS — E7800 Pure hypercholesterolemia, unspecified: Secondary | ICD-10-CM | POA: Diagnosis not present

## 2015-12-21 DIAGNOSIS — R7303 Prediabetes: Secondary | ICD-10-CM | POA: Diagnosis not present

## 2015-12-21 DIAGNOSIS — G4733 Obstructive sleep apnea (adult) (pediatric): Secondary | ICD-10-CM

## 2015-12-21 LAB — COMPREHENSIVE METABOLIC PANEL
ALT: 18 U/L (ref 0–35)
AST: 16 U/L (ref 0–37)
Albumin: 3.6 g/dL (ref 3.5–5.2)
Alkaline Phosphatase: 81 U/L (ref 39–117)
BUN: 15 mg/dL (ref 6–23)
CALCIUM: 8.4 mg/dL (ref 8.4–10.5)
CHLORIDE: 103 meq/L (ref 96–112)
CO2: 31 meq/L (ref 19–32)
Creatinine, Ser: 0.93 mg/dL (ref 0.40–1.20)
GFR: 78.84 mL/min (ref >60.00)
Glucose, Bld: 84 mg/dL (ref 70–99)
Potassium: 3.5 mEq/L (ref 3.5–5.1)
Sodium: 143 mEq/L (ref 135–145)
Total Bilirubin: 0.3 mg/dL (ref 0.2–1.2)
Total Protein: 7.4 g/dL (ref 6.0–8.3)

## 2015-12-21 LAB — CBC
HCT: 44.8 % (ref 36.0–46.0)
HEMOGLOBIN: 14.9 g/dL (ref 12.0–15.0)
MCHC: 33.2 g/dL (ref 30.0–36.0)
MCV: 84 fl (ref 78.0–100.0)
PLATELETS: 286 10*3/uL (ref 150.0–400.0)
RBC: 5.34 Mil/uL — ABNORMAL HIGH (ref 3.87–5.11)
RDW: 14.7 % (ref 11.5–15.5)
WBC: 7.6 10*3/uL (ref 4.0–10.5)

## 2015-12-21 LAB — LIPID PANEL
CHOLESTEROL: 260 mg/dL — AB (ref 0–200)
HDL: 60.8 mg/dL (ref >39.00)
LDL Cholesterol: 176 mg/dL — ABNORMAL HIGH (ref 0–99)
NonHDL: 198.84
Total CHOL/HDL Ratio: 4
Triglycerides: 114 mg/dL (ref 0.0–149.0)
VLDL: 22.8 mg/dL (ref 0.0–40.0)

## 2015-12-21 LAB — TSH: TSH: 4.2 u[IU]/mL (ref 0.35–4.50)

## 2015-12-21 LAB — T4, FREE: FREE T4: 1.02 ng/dL (ref 0.60–1.60)

## 2015-12-21 LAB — HEMOGLOBIN A1C: HEMOGLOBIN A1C: 6 % (ref 4.6–6.5)

## 2015-12-21 MED ORDER — SIMVASTATIN 20 MG PO TABS: 20.0000 mg | ORAL_TABLET | Freq: Every day | ORAL | 1 refills | Status: DC

## 2015-12-21 MED FILL — SIMVASTATIN 20 MG TABLET: 20 | 90 days supply | Qty: 90 | Fill #0

## 2015-12-21 NOTE — Assessment & Plan Note (Signed)
Encouraged her to consume a low fat, low carb diet and exercise to lose weight A1C today

## 2015-12-21 NOTE — Progress Notes (Signed)
Subjective:    Patient ID: Judith Ross, female    DOB: September 17, 1955, 60 y.o.   MRN: [id number]  HPI  Pt presents to the clinic today for follow up of chronic conditions.  HLD: Her last LDL was 156. She had not been taking the Zocor as previously prescribed because of some concern for vaginal itching. She was advised to start taking the medication and consume a low fat diet. She has been taking the Zocor as prescribed but reports she has been out of the meds for the last 2 weeks. She denies myalgias.  HTN: Her blood pressure is elevated this morning because she has not taken her BP medication today. She is prescribed Benazapril and HCTZ. Her BP today is 148/98. She denies chest pain, tightness or shortness of breath. ECG from 09/2014 reviewed.  Hypothyroidism: Her labs were last checked 6 months ago. TSH was elevated, but this is because she had been out of her Synthroid. She reports she has been taking it as prescribed. She denies fatigue, weight gain, cold intolerance, constipation, or dry skin.  Bilateral Knee Pain: This has been ongoing for 5 years. She has pain with walking, better with sitting. They do swell occassionally. 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 She will take Aleve or Ibuprofen as needed.  OSA: She has been working on weight loss.  Prediabetes: Her last A1C was 6.0%. She does not try to consume a low carb diet and is not exercising. She has lost 5.5 lbs since her last visit.   Review of Systems      Past Medical History:  Diagnosis Date  . Arthritis   . Chicken pox   . Hyperlipidemia   . Hypertension   . Obesity   . Sleep apnea   . Thyroid disease     Current Outpatient Prescriptions  Medication Sig Dispense Refill  . benazepril (LOTENSIN) 10 MG tablet Take 1 tablet (10 mg total) by mouth daily. PLEASE SCHEDULE FOLLOW UP OFFICE VISIT FOR September 2017 90 tablet 0  . hydrochlorothiazide (HYDRODIURIL) 25 MG tablet Take 1 tablet (25 mg  total) by mouth daily. PLEASE SCHEDULE FOLLOW UP OFFICE VISIT FOR September 2017 90 tablet 0  . levothyroxine (SYNTHROID, LEVOTHROID) 100 MCG tablet Take 1 tablet (100 mcg total) by mouth daily. 90 tablet 1  . simvastatin (ZOCOR) 20 MG tablet Take 1 tablet (20 mg total) by mouth at bedtime. 90 tablet 1   No current facility-administered medications for this visit.     Allergies  Allergen Reactions  . Erythromycin Itching    Family History  Problem Relation Age of Onset  . Stroke Father   . Diabetes Maternal Uncle   . Diabetes Maternal Grandmother   . Hypertension Maternal Grandmother   . Cancer Maternal Aunt     Breast    Social History   Social History  . Marital status: Divorced    Spouse name: N/A  . Number of children: N/A  . Years of education: N/A   Occupational History  . Not on file.   Social History Main Topics  . Smoking status: Never Smoker  . Smokeless tobacco: Not on file  . Alcohol use No  . Drug use: No  . Sexual activity: Yes   Other Topics Concern  . Not on file   Social History Narrative  . No narrative on file     Constitutional: Denies fever, malaise, fatigue, headache or abrupt weight changes.  Respiratory: Denies difficulty breathing, shortness of  breath, cough or sputum production.   Cardiovascular: Denies chest pain, chest tightness, palpitations or swelling in the hands or feet.  Musculoskeletal: Pt reports bilateral knee pain. Denies decrease in range of motion, difficulty with gait, muscle pain or joint swelling.  Skin: Denies redness, rashes, lesions or ulcercations.  Neurological: Denies dizziness, difficulty with memory, difficulty with speech or problems with balance and coordination.  Psych: Denies anxiety, depression, SI/HI.  No other specific complaints in a complete review of systems (except as listed in HPI above).  Objective:   Physical Exam   BP (!) 148/98   Pulse 66   Temp 98.6 F (37 C) (Oral)   Wt 268 lb 8 oz  (121.8 kg)   SpO2 99%   BMI 43.34 kg/m   Wt Readings from Last 3 Encounters:  05/24/15 274 lb (124.3 kg)  02/18/15 274 lb (124.3 kg)  10/08/14 268 lb (121.6 kg)    General: Appears her stated age, obese in NAD. Cardiovascular: Normal rate and rhythm. S1,S2 noted.  No murmur, rubs or gallops noted. No JVD or BLE edema. No carotid bruits noted. Pulmonary/Chest: Normal effort and positive vesicular breath sounds. No respiratory distress. No wheezes, rales or ronchi noted.  Musculoskeletal: Normal flexion and extension of the knees. Strength 5/5 BUE/BLE. No signs of joint swelling. No difficulty with gait.  Neurological: Alert and oriented.  Psychiatric: Mood and affect normal. Behavior is normal. Judgment and thought content normal.      BMET    Component Value Date/Time   NA 140 05/24/2015 0903   K 4.1 05/24/2015 0903   CL 103 05/24/2015 0903   CO2 30 05/24/2015 0903   GLUCOSE 88 05/24/2015 0903   BUN 13 05/24/2015 0903   CREATININE 0.92 05/24/2015 0903   CALCIUM 8.8 05/24/2015 0903   GFRNONAA >60 01/10/2010 0915   GFRAA  01/10/2010 0915    >60        The eGFR has been calculated using the MDRD equation. This calculation has not been validated in all clinical situations. eGFR's persistently <60 mL/min signify possible Chronic Kidney Disease.    Lipid Panel     Component Value Date/Time   CHOL 234 (H) 05/24/2015 0903   TRIG 85.0 05/24/2015 0903   HDL 60.20 05/24/2015 0903   CHOLHDL 4 05/24/2015 0903   VLDL 17.0 05/24/2015 0903   LDLCALC 156 (H) 05/24/2015 0903    CBC    Component Value Date/Time   WBC 6.5 05/24/2015 0903   RBC 5.38 (H) 05/24/2015 0903   HGB 14.9 05/24/2015 0903   HCT 45.0 05/24/2015 0903   PLT 289.0 05/24/2015 0903   MCV 83.6 05/24/2015 0903   MCH 28.1 01/18/2010 0530   MCHC 33.2 05/24/2015 0903   RDW 14.3 05/24/2015 0903    Hgb A1C Lab Results  Component Value Date   HGBA1C 6.0 05/24/2015        Assessment & Plan:   Webb Silversmith, NP

## 2015-12-21 NOTE — Assessment & Plan Note (Signed)
Encouraged weight loss 

## 2015-12-21 NOTE — Assessment & Plan Note (Signed)
Not controlled She refuses to allow me to go up on her Benzapril at this point CBC and CMET today Continue Benzapril and HCTZ at this time, at next visit, will increase Benazapril to 20 mg if it remains elevated.

## 2015-12-21 NOTE — Assessment & Plan Note (Signed)
Discussed importance of medication compliance Handout given on low fat diet CMET and Lipid Profile today Continue Zocor unless directed otherwise Continue baby ASA daily

## 2015-12-21 NOTE — Patient Instructions (Signed)
Hypertension Hypertension, commonly called high blood pressure, is when the force of blood pumping through your arteries is too strong. Your arteries are the blood vessels that carry blood from your heart throughout your body. A blood pressure reading consists of a higher number over a lower number, such as 110/72. The higher number (systolic) is the pressure inside your arteries when your heart pumps. The lower number (diastolic) is the pressure inside your arteries when your heart relaxes. Ideally you want your blood pressure below 120/80. Hypertension forces your heart to work harder to pump blood. Your arteries may become narrow or stiff. Having untreated or uncontrolled hypertension can cause heart attack, stroke, kidney disease, and other problems. RISK FACTORS Some risk factors for high blood pressure are controllable. Others are not.  Risk factors you cannot control include:   Race. You may be at higher risk if you are African American.  Age. Risk increases with age.  Gender. Men are at higher risk than women before age 45 years. After age 65, women are at higher risk than men. Risk factors you can control include:  Not getting enough exercise or physical activity.  Being overweight.  Getting too much fat, sugar, calories, or salt in your diet.  Drinking too much alcohol. SIGNS AND SYMPTOMS Hypertension does not usually cause signs or symptoms. Extremely high blood pressure (hypertensive crisis) may cause headache, anxiety, shortness of breath, and nosebleed. DIAGNOSIS To check if you have hypertension, your health care provider will measure your blood pressure while you are seated, with your arm held at the level of your heart. It should be measured at least twice using the same arm. Certain conditions can cause a difference in blood pressure between your right and left arms. A blood pressure reading that is higher than normal on one occasion does not mean that you need treatment. If  it is not clear whether you have high blood pressure, you may be asked to return on a different day to have your blood pressure checked again. Or, you may be asked to monitor your blood pressure at home for 1 or more weeks. TREATMENT Treating high blood pressure includes making lifestyle changes and possibly taking medicine. Living a healthy lifestyle can help lower high blood pressure. You may need to change some of your habits. Lifestyle changes may include:  Following the DASH diet. This diet is high in fruits, vegetables, and whole grains. It is low in salt, red meat, and added sugars.  Keep your sodium intake below 2,300 mg per day.  Getting at least 30-45 minutes of aerobic exercise at least 4 times per week.  Losing weight if necessary.  Not smoking.  Limiting alcoholic beverages.  Learning ways to reduce stress. Your health care provider may prescribe medicine if lifestyle changes are not enough to get your blood pressure under control, and if one of the following is true:  You are 18-59 years of age and your systolic blood pressure is above 140.  You are 60 years of age or older, and your systolic blood pressure is above 150.  Your diastolic blood pressure is above 90.  You have diabetes, and your systolic blood pressure is over 140 or your diastolic blood pressure is over 90.  You have kidney disease and your blood pressure is above 140/90.  You have heart disease and your blood pressure is above 140/90. Your personal target blood pressure may vary depending on your medical conditions, your age, and other factors. HOME CARE INSTRUCTIONS    Have your blood pressure rechecked as directed by your health care provider.   Take medicines only as directed by your health care provider. Follow the directions carefully. Blood pressure medicines must be taken as prescribed. The medicine does not work as well when you skip doses. Skipping doses also puts you at risk for  problems.  Do not smoke.   Monitor your blood pressure at home as directed by your health care provider. SEEK MEDICAL CARE IF:   You think you are having a reaction to medicines taken.  You have recurrent headaches or feel dizzy.  You have swelling in your ankles.  You have trouble with your vision. SEEK IMMEDIATE MEDICAL CARE IF:  You develop a severe headache or confusion.  You have unusual weakness, numbness, or feel faint.  You have severe chest or abdominal pain.  You vomit repeatedly.  You have trouble breathing. MAKE SURE YOU:   Understand these instructions.  Will watch your condition.  Will get help right away if you are not doing well or get worse.   This information is not intended to replace advice given to you by your health care provider. Make sure you discuss any questions you have with your health care provider.   Document Released: 03/06/2005 Document Revised: 07/21/2014 Document Reviewed: 12/27/2012 Elsevier Interactive Patient Education 2016 [employer]  

## 2015-12-21 NOTE — Assessment & Plan Note (Signed)
Mainly knees Encouraged weight loss Continue Aleve as needed

## 2015-12-21 NOTE — Assessment & Plan Note (Signed)
Check TSH and T4 today Continue current dose of Synthroid - will adjust if needed based on labs

## 2015-12-31 NOTE — Addendum Note (Signed)
Addended by: Lurlean Nanny on: 12/31/2015 11:33 AM   Modules accepted: Orders

## 2016-01-07 MED ORDER — SIMVASTATIN 40 MG PO TABS: 40.0000 mg | ORAL_TABLET | Freq: Every day | ORAL | 0 refills | Status: DC

## 2016-01-07 NOTE — Addendum Note (Signed)
Addended by: Lurlean Nanny on: 01/07/2016 09:41 AM   Modules accepted: Orders

## 2016-01-10 MED FILL — CLOBETASOL 0.05% OINTMENT: 0.05 | 28 days supply | Qty: 45 | Fill #0

## 2016-02-09 ENCOUNTER — Other Ambulatory Visit: Payer: Self-pay | Admitting: Internal Medicine

## 2016-02-09 MED FILL — LEVOTHYROXINE 100 MCG TAB: 100 | 90 days supply | Qty: 90 | Fill #0

## 2016-05-01 MED FILL — CLOBETASOL 0.05% OINTMENT: 0.05 | 28 days supply | Qty: 45 | Fill #1

## 2016-05-23 ENCOUNTER — Other Ambulatory Visit: Payer: Self-pay | Admitting: Internal Medicine

## 2016-05-24 MED FILL — LEVOTHYROXINE 100 MCG TABLE: 100 | 90 days supply | Qty: 90 | Fill #0

## 2016-05-24 MED FILL — BENAZEPRIL HCL 10 MG TABLET: 10 | 90 days supply | Qty: 90 | Fill #0

## 2016-07-03 MED FILL — CLOBETASOL 0.05% OINTMENT: 0.05 | 28 days supply | Qty: 45 | Fill #2

## 2016-08-29 ENCOUNTER — Encounter: Payer: Self-pay | Admitting: Internal Medicine

## 2016-08-29 ENCOUNTER — Ambulatory Visit (INDEPENDENT_AMBULATORY_CARE_PROVIDER_SITE_OTHER): Payer: 59 | Admitting: Internal Medicine

## 2016-08-29 VITALS — BP 140/98 | HR 74 | Temp 97.9°F | Ht 65.25 in | Wt 274.0 lb

## 2016-08-29 DIAGNOSIS — G4733 Obstructive sleep apnea (adult) (pediatric): Secondary | ICD-10-CM | POA: Diagnosis not present

## 2016-08-29 DIAGNOSIS — R7303 Prediabetes: Secondary | ICD-10-CM | POA: Diagnosis not present

## 2016-08-29 DIAGNOSIS — E7800 Pure hypercholesterolemia, unspecified: Secondary | ICD-10-CM | POA: Diagnosis not present

## 2016-08-29 DIAGNOSIS — M1990 Unspecified osteoarthritis, unspecified site: Secondary | ICD-10-CM | POA: Diagnosis not present

## 2016-08-29 DIAGNOSIS — Z0001 Encounter for general adult medical examination with abnormal findings: Secondary | ICD-10-CM | POA: Diagnosis not present

## 2016-08-29 DIAGNOSIS — I10 Essential (primary) hypertension: Secondary | ICD-10-CM | POA: Diagnosis not present

## 2016-08-29 DIAGNOSIS — E039 Hypothyroidism, unspecified: Secondary | ICD-10-CM | POA: Diagnosis not present

## 2016-08-29 LAB — CBC
HEMATOCRIT: 45.2 % (ref 36.0–46.0)
Hemoglobin: 14.8 g/dL (ref 12.0–15.0)
MCHC: 32.9 g/dL (ref 30.0–36.0)
MCV: 85.3 fl (ref 78.0–100.0)
Platelets: 289 10*3/uL (ref 150.0–400.0)
RBC: 5.29 Mil/uL — ABNORMAL HIGH (ref 3.87–5.11)
RDW: 15.1 % (ref 11.5–15.5)
WBC: 6.9 10*3/uL (ref 4.0–10.5)

## 2016-08-29 LAB — COMPREHENSIVE METABOLIC PANEL
ALK PHOS: 81 U/L (ref 39–117)
ALT: 17 U/L (ref 0–35)
AST: 14 U/L (ref 0–37)
Albumin: 4 g/dL (ref 3.5–5.2)
BUN: 20 mg/dL (ref 6–23)
CHLORIDE: 103 meq/L (ref 96–112)
CO2: 33 mEq/L — ABNORMAL HIGH (ref 19–32)
Calcium: 8.9 mg/dL (ref 8.4–10.5)
Creatinine, Ser: 1.09 mg/dL (ref 0.40–1.20)
GFR: 65.49 mL/min (ref >60.00)
GLUCOSE: 83 mg/dL (ref 70–99)
POTASSIUM: 4.3 meq/L (ref 3.5–5.1)
SODIUM: 142 meq/L (ref 135–145)
TOTAL PROTEIN: 7.5 g/dL (ref 6.0–8.3)
Total Bilirubin: 0.4 mg/dL (ref 0.2–1.2)

## 2016-08-29 LAB — LIPID PANEL
CHOL/HDL RATIO: 4
Cholesterol: 238 mg/dL — ABNORMAL HIGH (ref 0–200)
HDL: 59.1 mg/dL (ref >39.00)
LDL CALC: 154 mg/dL — AB (ref 0–99)
NonHDL: 178.45
TRIGLYCERIDES: 123 mg/dL (ref 0.0–149.0)
VLDL: 24.6 mg/dL (ref 0.0–40.0)

## 2016-08-29 LAB — T4, FREE: Free T4: 0.89 ng/dL (ref 0.60–1.60)

## 2016-08-29 LAB — HEMOGLOBIN A1C: Hgb A1c MFr Bld: 6 % (ref 4.6–6.5)

## 2016-08-29 LAB — TSH: TSH: 4.81 u[IU]/mL — ABNORMAL HIGH (ref 0.35–4.50)

## 2016-08-29 MED ORDER — BENAZEPRIL HCL 20 MG PO TABS: 20.0000 mg | ORAL_TABLET | Freq: Every day | ORAL | 0 refills | Status: DC

## 2016-08-29 MED FILL — BENAZEPRIL HCL 20 MG TABLET: 20 | 30 days supply | Qty: 30 | Fill #0

## 2016-08-29 NOTE — Assessment & Plan Note (Signed)
Encouraged her to work on diet and exercise 

## 2016-08-29 NOTE — Progress Notes (Signed)
Subjective:    Patient ID: Judith Ross, female    DOB: [birth year], 61 y.o.   MRN: [id number]  HPI  Pt presents to the clinic today for her annual exam. She is also due to follow up chronic conditions.  HLD: Her last LDL was 176, 12/2016. She has been taking the Zocor as prescribed. She denies myalgias. She has been trying to consume a low fat diet but reports she could do better.  HTN: Her BP today is 140/98. She refused at her last visit to increase her Benazepril to 20 mg daily. She continues to take the HCTZ. ECG from 09/2014 reviewed.  Hypothyroidism: Her levels were last checked 12/2015- normal. She has been taking her Synthroid daily as prescribed. She denies weight gain, fatigue, constipation, dry skin, cold intolerance or depression.  OA: Mainly in her knees. She takes Aleve or Ibuprofen as needed with good relief. She is thinking about going to see Dr. Despina Hick again to see about injections in her knees. She is requesting renewal of her handicap placard today.  OSA: She averages about 5  hours of sleep during the night. She does feel rested when she wakes up. She does not wear a CPAP machine. She is requesting a referral for a repeat sleep study.  Prediabetes: Her last A1C was 6 %. She does not check her sugars. She does not adhere to any specific diet or exercise regimen.  Flu: 11/2015 Tetanus: 09/2014 Zostovax: never Shingrix: never Pap Smear: > 5 years ago, total hysterectomy Mammogram: > 5 years ago Colon Screening: never Vision Screening: as needed, scheduled for 09/2016 Dentist: as needed  Diet: She does eat meat. She consumes more veggies than fruits. She does eat some fried foods. She drinks mostly water (flavored packets), sweet tea. Exercise: None  Review of Systems      Past Medical History:  Diagnosis Date  . Arthritis   . Chicken pox   . Hyperlipidemia   . Hypertension   . Obesity   . Sleep apnea   . Thyroid disease     Current Outpatient  Prescriptions  Medication Sig Dispense Refill  . benazepril (LOTENSIN) 10 MG tablet Take 1 tablet (10 mg total) by mouth daily. MUST SCHEDULE ANNUAL PHYSICAL EXAM 90 tablet 0  . hydrochlorothiazide (HYDRODIURIL) 25 MG tablet Take 1 tablet (25 mg total) by mouth daily. PLEASE SCHEDULE FOLLOW UP OFFICE VISIT FOR September 2017 90 tablet 0  . levothyroxine (SYNTHROID, LEVOTHROID) 100 MCG tablet Take 1 tablet (100 mcg total) by mouth daily. MUST SCHEDULE ANNUAL PHYSICAL EXAM 90 tablet 0  . simvastatin (ZOCOR) 40 MG tablet Take 1 tablet (40 mg total) by mouth daily. 90 tablet 0   No current facility-administered medications for this visit.     Allergies  Allergen Reactions  . Erythromycin Itching    Family History  Problem Relation Age of Onset  . Stroke Father   . Diabetes Maternal Grandmother   . Hypertension Maternal Grandmother   . Diabetes Maternal Uncle   . Cancer Maternal Aunt        Breast    Social History   Social History  . Marital status: Divorced    Spouse name: N/A  . Number of children: N/A  . Years of education: N/A   Occupational History  . Not on file.   Social History Main Topics  . Smoking status: Never Smoker  . Smokeless tobacco: Never Used  . Alcohol use No  . Drug use: No  .  Sexual activity: Yes   Other Topics Concern  . Not on file   Social History Narrative  . No narrative on file     Constitutional: Denies fever, malaise, fatigue, headache or abrupt weight changes.  HEENT: Denies eye pain, eye redness, ear pain, ringing in the ears, wax buildup, runny nose, nasal congestion, bloody nose, or sore throat. Respiratory: Denies difficulty breathing, shortness of breath, cough or sputum production.   Cardiovascular: pt reports swelling in her legs. Denies chest pain, chest tightness, palpitations or swelling in the hands.  Gastrointestinal: Denies abdominal pain, bloating, constipation, diarrhea or blood in the stool.  GU: Denies urgency,  frequency, pain with urination, burning sensation, blood in urine, odor or discharge. Musculoskeletal: Pt reports bilateral knee pain and difficulty with gait. Denies decrease in range of motion, muscle pain or joint swelling.  Skin: Denies redness, rashes, lesions or ulcercations.  Neurological: Denies dizziness, difficulty with memory, difficulty with speech or problems with balance and coordination.  Psych: Denies anxiety, depression, SI/HI.  No other specific complaints in a complete review of systems (except as listed in HPI above).  Objective:   Physical Exam  BP (!) 140/98   Pulse 74   Temp 97.9 F (36.6 C) (Oral)   Ht 5' 5.25" (1.657 m)   Wt 274 lb (124.3 kg)   SpO2 99%   BMI 45.25 kg/m  Wt Readings from Last 3 Encounters:  08/29/16 274 lb (124.3 kg)  12/21/15 268 lb 8 oz (121.8 kg)  05/24/15 274 lb (124.3 kg)    General: Appears her stated age, obese in NAD. Skin: Warm, dry and intact.  HEENT: Head: normal shape and size; Eyes: sclera white, no icterus, conjunctiva pink, PERRLA and EOMs intact; Ears: Tm's gray and intact, normal light reflex; Throat/Mouth: Teeth present, mucosa pink and moist, no exudate, lesions or ulcerations noted.  Neck:  Neck supple, trachea midline. No masses, lumps noted. Thyromegaly present.  Cardiovascular: Normal rate and rhythm. S1,S2 noted.  No murmur, rubs or gallops noted. Trace BLE edema. No carotid bruits noted. Pulmonary/Chest: Normal effort and positive vesicular breath sounds. No respiratory distress. No wheezes, rales or ronchi noted.  Abdomen: Soft and nontender. Normal bowel sounds. No distention or masses noted. Liver, spleen and kidneys non palpable. Musculoskeletal: Normal flexion and extension noted of bilateral knees. Crepitus noted with ROM. No signs of joint swelling. Strength 5/5 BUE/BLE. Neurological: Alert and oriented. Cranial nerves II-XII grossly intact. Coordination normal.  Psychiatric: Mood and affect normal. Behavior  is normal. Judgment and thought content normal.     BMET    Component Value Date/Time   NA 143 12/21/2015 0915   K 3.5 12/21/2015 0915   CL 103 12/21/2015 0915   CO2 31 12/21/2015 0915   GLUCOSE 84 12/21/2015 0915   BUN 15 12/21/2015 0915   CREATININE 0.93 12/21/2015 0915   CALCIUM 8.4 12/21/2015 0915   GFRNONAA >60 01/10/2010 0915   GFRAA  01/10/2010 0915    >60        The eGFR has been calculated using the MDRD equation. This calculation has not been validated in all clinical situations. eGFR's persistently <60 mL/min signify possible Chronic Kidney Disease.    Lipid Panel     Component Value Date/Time   CHOL 260 (H) 12/21/2015 0915   TRIG 114.0 12/21/2015 0915   HDL 60.80 12/21/2015 0915   CHOLHDL 4 12/21/2015 0915   VLDL 22.8 12/21/2015 0915   LDLCALC 176 (H) 12/21/2015 0915    CBC  Component Value Date/Time   WBC 7.6 12/21/2015 0915   RBC 5.34 (H) 12/21/2015 0915   HGB 14.9 12/21/2015 0915   HCT 44.8 12/21/2015 0915   PLT 286.0 12/21/2015 0915   MCV 84.0 12/21/2015 0915   MCH 28.1 01/18/2010 0530   MCHC 33.2 12/21/2015 0915   RDW 14.7 12/21/2015 0915    Hgb A1C Lab Results  Component Value Date   HGBA1C 6.0 12/21/2015           Assessment & Plan:   Preventative Health Maintenance:  Encouraged her to get a flu shot in the fall Tetanus UTD She will call insurance about Zostovax/Shingrix She no longer needs pap smears She declines mammogram or colon cancer screening Encouraged her to consume a balanced diet and exercise regimen Advised her to see an eye doctor and dentist at least annually Will check CBC, CMET, Lipid and A1C today  RTC in 3 weeks for follow up HTN Thadius Smisek, NP

## 2016-08-29 NOTE — Assessment & Plan Note (Signed)
TSH and Free T4 today Continue Synthroid for now, will adjust if needed based on labs She will need a refill once labs are back

## 2016-08-29 NOTE — Patient Instructions (Signed)
Health Maintenance for Postmenopausal Women Menopause is a normal process in which your reproductive ability comes to an end. This process happens gradually over a span of months to years, usually between the ages of 22 and 9. Menopause is complete when you have missed 12 consecutive menstrual periods. It is important to talk with your health care provider about some of the most common conditions that affect postmenopausal women, such as heart disease, cancer, and bone loss (osteoporosis). Adopting a healthy lifestyle and getting preventive care can help to promote your health and wellness. Those actions can also lower your chances of developing some of these common conditions. What should I know about menopause? During menopause, you may experience a number of symptoms, such as:  Moderate-to-severe hot flashes.  Night sweats.  Decrease in sex drive.  Mood swings.  Headaches.  Tiredness.  Irritability.  Memory problems.  Insomnia.  Choosing to treat or not to treat menopausal changes is an individual decision that you make with your health care provider. What should I know about hormone replacement therapy and supplements? Hormone therapy products are effective for treating symptoms that are associated with menopause, such as hot flashes and night sweats. Hormone replacement carries certain risks, especially as you become older. If you are thinking about using estrogen or estrogen with progestin treatments, discuss the benefits and risks with your health care provider. What should I know about heart disease and stroke? Heart disease, heart attack, and stroke become more likely as you age. This may be due, in part, to the hormonal changes that your body experiences during menopause. These can affect how your body processes dietary fats, triglycerides, and cholesterol. Heart attack and stroke are both medical emergencies. There are many things that you can do to help prevent heart disease  and stroke:  Have your blood pressure checked at least every 1-2 years. High blood pressure causes heart disease and increases the risk of stroke.  If you are 53-22 years old, ask your health care provider if you should take aspirin to prevent a heart attack or a stroke.  Do not use any tobacco products, including cigarettes, chewing tobacco, or electronic cigarettes. If you need help quitting, ask your health care provider.  It is important to eat a healthy diet and maintain a healthy weight. ? Be sure to include plenty of vegetables, fruits, low-fat dairy products, and lean protein. ? Avoid eating foods that are high in solid fats, added sugars, or salt (sodium).  Get regular exercise. This is one of the most important things that you can do for your health. ? Try to exercise for at least 150 minutes each week. The type of exercise that you do should increase your heart rate and make you sweat. This is known as moderate-intensity exercise. ? Try to do strengthening exercises at least twice each week. Do these in addition to the moderate-intensity exercise.  Know your numbers.Ask your health care provider to check your cholesterol and your blood glucose. Continue to have your blood tested as directed by your health care provider.  What should I know about cancer screening? There are several types of cancer. Take the following steps to reduce your risk and to catch any cancer development as early as possible. Breast Cancer  Practice breast self-awareness. ? This means understanding how your breasts normally appear and feel. ? It also means doing regular breast self-exams. Let your health care provider know about any changes, no matter how small.  If you are 40  or older, have a clinician do a breast exam (clinical breast exam or CBE) every year. Depending on your age, family history, and medical history, it may be recommended that you also have a yearly breast X-ray (mammogram).  If you  have a family history of breast cancer, talk with your health care provider about genetic screening.  If you are at high risk for breast cancer, talk with your health care provider about having an MRI and a mammogram every year.  Breast cancer (BRCA) gene test is recommended for women who have family members with BRCA-related cancers. Results of the assessment will determine the need for genetic counseling and BRCA1 and for BRCA2 testing. BRCA-related cancers include these types: ? Breast. This occurs in males or females. ? Ovarian. ? Tubal. This may also be called fallopian tube cancer. ? Cancer of the abdominal or pelvic lining (peritoneal cancer). ? Prostate. ? Pancreatic.  Cervical, Uterine, and Ovarian Cancer Your health care provider may recommend that you be screened regularly for cancer of the pelvic organs. These include your ovaries, uterus, and vagina. This screening involves a pelvic exam, which includes checking for microscopic changes to the surface of your cervix (Pap test).  For women ages 21-65, health care providers may recommend a pelvic exam and a Pap test every three years. For women ages 79-65, they may recommend the Pap test and pelvic exam, combined with testing for human papilloma virus (HPV), every five years. Some types of HPV increase your risk of cervical cancer. Testing for HPV may also be done on women of any age who have unclear Pap test results.  Other health care providers may not recommend any screening for nonpregnant women who are considered low risk for pelvic cancer and have no symptoms. Ask your health care provider if a screening pelvic exam is right for you.  If you have had past treatment for cervical cancer or a condition that could lead to cancer, you need Pap tests and screening for cancer for at least 20 years after your treatment. If Pap tests have been discontinued for you, your risk factors (such as having a new sexual partner) need to be  reassessed to determine if you should start having screenings again. Some women have medical problems that increase the chance of getting cervical cancer. In these cases, your health care provider may recommend that you have screening and Pap tests more often.  If you have a family history of uterine cancer or ovarian cancer, talk with your health care provider about genetic screening.  If you have vaginal bleeding after reaching menopause, tell your health care provider.  There are currently no reliable tests available to screen for ovarian cancer.  Lung Cancer Lung cancer screening is recommended for adults 69-62 years old who are at high risk for lung cancer because of a history of smoking. A yearly low-dose CT scan of the lungs is recommended if you:  Currently smoke.  Have a history of at least 30 pack-years of smoking and you currently smoke or have quit within the past 15 years. A pack-year is smoking an average of one pack of cigarettes per day for one year.  Yearly screening should:  Continue until it has been 15 years since you quit.  Stop if you develop a health problem that would prevent you from having lung cancer treatment.  Colorectal Cancer  This type of cancer can be detected and can often be prevented.  Routine colorectal cancer screening usually begins at  age 42 and continues through age 45.  If you have risk factors for colon cancer, your health care provider may recommend that you be screened at an earlier age.  If you have a family history of colorectal cancer, talk with your health care provider about genetic screening.  Your health care provider may also recommend using home test kits to check for hidden blood in your stool.  A small camera at the end of a tube can be used to examine your colon directly (sigmoidoscopy or colonoscopy). This is done to check for the earliest forms of colorectal cancer.  Direct examination of the colon should be repeated every  5-10 years until age 71. However, if early forms of precancerous polyps or small growths are found or if you have a family history or genetic risk for colorectal cancer, you may need to be screened more often.  Skin Cancer  Check your skin from head to toe regularly.  Monitor any moles. Be sure to tell your health care provider: ? About any new moles or changes in moles, especially if there is a change in a mole's shape or color. ? If you have a mole that is larger than the size of a pencil eraser.  If any of your family members has a history of skin cancer, especially at a young age, talk with your health care provider about genetic screening.  Always use sunscreen. Apply sunscreen liberally and repeatedly throughout the day.  Whenever you are outside, protect yourself by wearing long sleeves, pants, a wide-brimmed hat, and sunglasses.  What should I know about osteoporosis? Osteoporosis is a condition in which bone destruction happens more quickly than new bone creation. After menopause, you may be at an increased risk for osteoporosis. To help prevent osteoporosis or the bone fractures that can happen because of osteoporosis, the following is recommended:  If you are 46-71 years old, get at least 1,000 mg of calcium and at least 600 mg of vitamin D per day.  If you are older than age 55 but younger than age 65, get at least 1,200 mg of calcium and at least 600 mg of vitamin D per day.  If you are older than age 54, get at least 1,200 mg of calcium and at least 800 mg of vitamin D per day.  Smoking and excessive alcohol intake increase the risk of osteoporosis. Eat foods that are rich in calcium and vitamin D, and do weight-bearing exercises several times each week as directed by your health care provider. What should I know about how menopause affects my mental health? Depression may occur at any age, but it is more common as you become older. Common symptoms of depression  include:  Low or sad mood.  Changes in sleep patterns.  Changes in appetite or eating patterns.  Feeling an overall lack of motivation or enjoyment of activities that you previously enjoyed.  Frequent crying spells.  Talk with your health care provider if you think that you are experiencing depression. What should I know about immunizations? It is important that you get and maintain your immunizations. These include:  Tetanus, diphtheria, and pertussis (Tdap) booster vaccine.  Influenza every year before the flu season begins.  Pneumonia vaccine.  Shingles vaccine.  Your health care provider may also recommend other immunizations. This information is not intended to replace advice given to you by your health care provider. Make sure you discuss any questions you have with your health care provider. Document Released: 04/28/2005  Document Revised: 09/24/2015 Document Reviewed: 12/08/2014 Elsevier Interactive Patient Education  2018 [employer]vier Inc.  

## 2016-08-29 NOTE — Assessment & Plan Note (Signed)
Mainly in her knees Advised her to take Aleve daily Make an appt with Dr. Maureen Ralphs Encouraged weight loss

## 2016-08-29 NOTE — Assessment & Plan Note (Signed)
Referral to pulmonology placed Encouraged weight loss

## 2016-08-29 NOTE — Assessment & Plan Note (Signed)
A1C today Encouraged her to consume a low carb diet and exercise for weight loss

## 2016-08-29 NOTE — Assessment & Plan Note (Signed)
CMET and lipid profile today Encouraged her to consume a low fat diet Continue Zocor, she will need a refill once labs are back

## 2016-08-29 NOTE — Assessment & Plan Note (Signed)
Remains elevated Increase Benazepril to 20 mg daily, eRx sent to pharmacy [facility] today

## 2016-08-31 ENCOUNTER — Other Ambulatory Visit: Payer: Self-pay | Admitting: Internal Medicine

## 2016-08-31 MED ORDER — SIMVASTATIN 40 MG PO TABS: 60.0000 mg | ORAL_TABLET | Freq: Every day | ORAL | 0 refills | Status: DC

## 2016-08-31 MED ORDER — LEVOTHYROXINE SODIUM 100 MCG PO TABS: 100.0000 ug | ORAL_TABLET | Freq: Every day | ORAL | 2 refills | Status: DC

## 2016-08-31 MED FILL — LEVOTHYROXINE 100 MCG TABLE: 100 | 90 days supply | Qty: 90 | Fill #0

## 2016-08-31 NOTE — Addendum Note (Signed)
Addended by: Lurlean Nanny on: 08/31/2016 12:49 PM   Modules accepted: Orders

## 2016-09-01 ENCOUNTER — Telehealth: Payer: Self-pay

## 2016-09-01 NOTE — Telephone Encounter (Signed)
-----   Message from Jearld Fenton, NP sent at 08/30/2016 11:44 AM EDT ----- Call pt:  Liver and kidney function are normal. Cholesterol remains elevated. Increase Zocor to 60 mg daily, #30, 2 refills. Repeat lipid in 3 months, lab only. A1C is stable. TSH borderline high but T4 is normal. Lets continue current dose of Synthroid for now. Blood counts are normal.

## 2016-09-01 NOTE — Telephone Encounter (Signed)
PA has been submitted via telephone for quantity limit... Awaiting response

## 2016-09-05 MED ORDER — SIMVASTATIN 20 MG PO TABS: 20.0000 mg | ORAL_TABLET | Freq: Every day | ORAL | 1 refills | Status: DC

## 2016-09-05 MED ORDER — SIMVASTATIN 40 MG PO TABS: 40.0000 mg | ORAL_TABLET | Freq: Every day | ORAL | 1 refills | Status: DC

## 2016-09-05 MED FILL — SIMVASTATIN 20 MG TABLET: 20 | 90 days supply | Qty: 90 | Fill #0

## 2016-09-05 MED FILL — SIMVASTATIN 40 MG TABLET: 40 | 90 days supply | Qty: 90 | Fill #0

## 2016-09-05 NOTE — Addendum Note (Signed)
Addended by: Lurlean Nanny on: 09/05/2016 08:47 AM   Modules accepted: Orders

## 2016-09-18 NOTE — Progress Notes (Signed)
[facility] Consultation      Assessment and Plan:  The patient is a 61 year old female with a history of hypertension, hypothyroidism, sleep apnea, not currently on CPAP.  Excessive daytime sleepiness. -Symptoms and signs of obstructive sleep apnea, we'll send for sleep study.  Hypothyroidism. -Continue thyroid replacement, sleep apnea can contribute to daytime sleepiness, and addition to hypothyroidism. high  Shift work sleep disorder. -Patient works night shift, also appears to get adequate sleep with only 5 hours of sleep per night. In addition, the patient has very poor sleep hygiene. -Discussed that it would be important for her to try to get more sleep, and addition, she should not watch television in bed.  Obesity. -Weight loss could be beneficial for reducing severity of obstructive sleep apnea, discussed importance of weight loss.  Date: 09/18/2016  MRN# [id number] Judith Ross 12-18-1955  Referring Physician:   NAEVIA Ross is a 61 y.o. old female seen in consultation for chief complaint of:    Chief Complaint  Patient presents with  . Advice Only    Referred by Judith Ross  . Sleep Apnea    HPI:   The patient is a 61 year old female with a history of hypertension, hypothyroidism, sleep apnea, not currently on CPAP.  She was diagnosed with OSA around 10 years ago, she never started on CPAP but not sure why. She has been tired during. she notes that she has a lot of  sleepiness. 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 She gets home from work at 830 am, goes to bed at 11 am, she falls asleep within 20 minutes. She wakes at 4 pm eat something, go back to bed where she  will remain until 9 pm watching television, goes to work at 10 pm.  On weekend she is off tries to revert to a daytime schedule by Sunday.   Her sleep is disturbed by knee pain.    PMHX:   Past Medical History:  Diagnosis Date  . Arthritis   . Chicken pox   . Hyperlipidemia    . Hypertension   . Obesity   . Sleep apnea   . Thyroid disease    Surgical Hx:  Past Surgical History:  Procedure Laterality Date  . ABDOMINAL HYSTERECTOMY  12/2009   total  . TOTAL THYROIDECTOMY  1991-92  . TUBAL LIGATION     Family Hx:  Family History  Problem Relation Age of Onset  . Stroke Father   . Diabetes Maternal Grandmother   . Hypertension Maternal Grandmother   . Diabetes Maternal Uncle   . Cancer Maternal Aunt        Breast   Social Hx:   Social History  Substance Use Topics  . Smoking status: Never Smoker  . Smokeless tobacco: Never Used  . Alcohol use No   Medication:    Current Outpatient Prescriptions:  .  benazepril (LOTENSIN) 20 MG tablet, Take 1 tablet (20 mg total) by mouth daily., Disp: 30 tablet, Rfl: 0 .  hydrochlorothiazide (HYDRODIURIL) 25 MG tablet, Take 1 tablet (25 mg total) by mouth daily. PLEASE SCHEDULE FOLLOW UP OFFICE VISIT FOR September 2017, Disp: 90 tablet, Rfl: 0 .  levothyroxine (SYNTHROID, LEVOTHROID) 100 MCG tablet, Take 1 tablet (100 mcg total) by mouth daily., Disp: 90 tablet, Rfl: 2 .  simvastatin (ZOCOR) 20 MG tablet, Take 1 tablet (20 mg total) by mouth daily., Disp: 90 tablet, Rfl: 1 .  simvastatin (ZOCOR) 40 MG tablet, Take 1 tablet (  40 mg total) by mouth daily., Disp: 90 tablet, Rfl: 1   Allergies:  Erythromycin  Review of Systems: Gen:  Denies  fever, sweats, chills HEENT: Denies blurred vision, double vision. bleeds, sore throat Cvc:  No dizziness, chest pain. Resp:   Denies cough or sputum production, shortness of breath Gi: Denies swallowing difficulty, stomach pain. Gu:  Denies bladder incontinence, burning urine Ext:   No Joint pain, stiffness. Skin: No skin rash,  hives  Endoc:  No polyuria, polydipsia. Psych: No depression, insomnia. Other:  All other systems were reviewed with the patient and were negative other that what is mentioned in the HPI.   Physical Examination:   VS: BP 118/82 (BP Location:  Left Arm, Patient Position: Sitting, Cuff Size: Large)   Pulse 88   Resp 16   Ht 5' 5.5" (1.664 m)   Wt 272 lb (123.4 kg)   SpO2 98%   BMI 44.57 kg/m   General Appearance: No distress obese.  Neuro:without focal findings,  speech normal,  HEENT: PERRLA, EOM intact.   Pulmonary: normal breath sounds, No wheezing.  CardiovascularNormal S1,S2.  No m/r/g.   Abdomen: Benign, Soft, non-tender. Renal:  No costovertebral tenderness  GU:  No performed at this time. Endoc: No evident thyromegaly, no signs of acromegaly. Skin:   warm, no rashes, no ecchymosis  Extremities: normal, no cyanosis, clubbing.  Other findings:    LABORATORY PANEL:   CBC No results for input(s): WBC, HGB, HCT, PLT in the last 168 hours. ------------------------------------------------------------------------------------------------------------------  Chemistries  No results for input(s): NA, K, CL, CO2, GLUCOSE, BUN, CREATININE, CALCIUM, MG, AST, ALT, ALKPHOS, BILITOT in the last 168 hours.  Invalid input(s): GFRCGP ------------------------------------------------------------------------------------------------------------------  Cardiac Enzymes No results for input(s): TROPONINI in the last 168 hours. ------------------------------------------------------------  RADIOLOGY:  No results found.     Thank  you for the consultation and for allowing [facility] to assist in the care of your patient. Our recommendations are noted above.  Please contact us if we can be of further service.   Marda Stalker, MD.  Board Certified in Internal Medicine, Pulmonary Medicine, [place], and Sleep Medicine.  [facility] Office Number: [phone]  Patricia Pesa, M.D.  Merton Border, M.D  09/18/2016

## 2016-09-19 ENCOUNTER — Encounter: Payer: Self-pay | Admitting: Internal Medicine

## 2016-09-19 ENCOUNTER — Ambulatory Visit (INDEPENDENT_AMBULATORY_CARE_PROVIDER_SITE_OTHER): Payer: 59 | Admitting: Internal Medicine

## 2016-09-19 VITALS — BP 118/82 | HR 88 | Resp 16 | Ht 65.5 in | Wt 272.0 lb

## 2016-09-19 DIAGNOSIS — G4719 Other hypersomnia: Secondary | ICD-10-CM | POA: Diagnosis not present

## 2016-09-19 NOTE — Patient Instructions (Addendum)
--

## 2016-09-19 NOTE — Addendum Note (Signed)
Addended by: Oscar La R on: 09/19/2016 10:14 AM   Modules accepted: Orders

## 2016-09-25 ENCOUNTER — Ambulatory Visit: Payer: 59 | Admitting: Internal Medicine

## 2016-09-25 DIAGNOSIS — Z0289 Encounter for other administrative examinations: Secondary | ICD-10-CM

## 2016-09-25 NOTE — Progress Notes (Deleted)
   Subjective:    Patient ID: Judith Ross, female    DOB: [birth year], 61 y.o.   MRN: [id number]  HPI  Pt presents to the clinic today for 3 week follow up of HTN. At her last visit, her Benazepril was increased to 20 mg daily. She has been taking the medication as prescribed and denies adverse effects. Her BP today is. ECG from 09/2014 reviewed.  Review of Systems  Past Medical History:  Diagnosis Date  . Arthritis   . Chicken pox   . Hyperlipidemia   . Hypertension   . Obesity   . Sleep apnea   . Thyroid disease     Current Outpatient Prescriptions  Medication Sig Dispense Refill  . benazepril (LOTENSIN) 20 MG tablet Take 1 tablet (20 mg total) by mouth daily. 30 tablet 0  . hydrochlorothiazide (HYDRODIURIL) 25 MG tablet Take 1 tablet (25 mg total) by mouth daily. PLEASE SCHEDULE FOLLOW UP OFFICE VISIT FOR September 2017 90 tablet 0  . levothyroxine (SYNTHROID, LEVOTHROID) 100 MCG tablet Take 1 tablet (100 mcg total) by mouth daily. 90 tablet 2  . simvastatin (ZOCOR) 20 MG tablet Take 1 tablet (20 mg total) by mouth daily. 90 tablet 1  . simvastatin (ZOCOR) 40 MG tablet Take 1 tablet (40 mg total) by mouth daily. 90 tablet 1   No current facility-administered medications for this visit.     Allergies  Allergen Reactions  . Erythromycin Itching    Family History  Problem Relation Age of Onset  . Stroke Father   . Diabetes Maternal Grandmother   . Hypertension Maternal Grandmother   . Diabetes Maternal Uncle   . Cancer Maternal Aunt        Breast    Social History   Social History  . Marital status: Divorced    Spouse name: N/A  . Number of children: N/A  . Years of education: N/A   Occupational History  . Not on file.   Social History Main Topics  . Smoking status: Never Smoker  . Smokeless tobacco: Never Used  . Alcohol use No  . Drug use: No  . Sexual activity: Yes   Other Topics Concern  . Not on file   Social History Narrative  . No  narrative on file     Constitutional: Denies fever, malaise, fatigue, headache or abrupt weight changes.  HEENT: Denies eye pain, eye redness, ear pain, ringing in the ears, wax buildup, runny nose, nasal congestion, bloody nose, or sore throat. Respiratory: Denies difficulty breathing, shortness of breath, cough or sputum production.   Cardiovascular: Denies chest pain, chest tightness, palpitations or swelling in the hands or feet.  Gastrointestinal: Denies abdominal pain, bloating, constipation, diarrhea or blood in the stool.  GU: Denies urgency, frequency, pain with urination, burning sensation, blood in urine, odor or discharge. Musculoskeletal: Denies decrease in range of motion, difficulty with gait, muscle pain or joint pain and swelling.  Skin: Denies redness, rashes, lesions or ulcercations.  Neurological: Denies dizziness, difficulty with memory, difficulty with speech or problems with balance and coordination.  Psych: Denies anxiety, depression, SI/HI.  No other specific complaints in a complete review of systems (except as listed in HPI above).     Objective:   Physical Exam        Assessment & Plan:

## 2016-10-17 ENCOUNTER — Encounter: Payer: Self-pay | Admitting: Internal Medicine

## 2016-10-17 ENCOUNTER — Ambulatory Visit: Payer: 59 | Attending: Internal Medicine

## 2016-10-17 DIAGNOSIS — G4733 Obstructive sleep apnea (adult) (pediatric): Secondary | ICD-10-CM | POA: Insufficient documentation

## 2016-10-17 DIAGNOSIS — G4719 Other hypersomnia: Secondary | ICD-10-CM

## 2016-10-19 ENCOUNTER — Telehealth: Payer: Self-pay | Admitting: *Deleted

## 2016-10-19 DIAGNOSIS — G4733 Obstructive sleep apnea (adult) (pediatric): Secondary | ICD-10-CM

## 2016-10-19 NOTE — Telephone Encounter (Signed)
-----   Message from Renelda Mom, [place] sent at 05/23/6859  3:45 PM EDT ----- Regarding: FW: sleep study results   ----- Message ----- From: Renelda Mom, LPN Sent: 08/26/3727   1:32 PM To: Laverle Hobby, MD Subject: RE: sleep study results                        Is this the correct patient? We have 2 staff messages with same patient's name but 2 different orders.  Misty ----- Message ----- From: Laverle Hobby, MD Sent: 10/19/2016  10:06 AM To: [facility] Pool Subject: sleep study results                            Sleep study showed moderate OSA with AHI of 18.7/hr. Recommend CPAP titration.

## 2016-10-19 NOTE — Telephone Encounter (Signed)
Laverle Hobby, MD  Oscar La R, LPN        This is incorrect, the other message I sent to you was correct.   Previous Messages    ----- Message -----  From: Renelda Mom, LPN  Sent: 0/05/7541  1:33 PM  To: Laverle Hobby, MD  Subject: RE: sleep study results.             Is this one the correct patient? This is under the same patient as the other message.   Misty  ----- Message -----  From: Laverle Hobby, MD  Sent: 10/19/2016 10:50 AM  To: [facility] Pool  Subject: sleep study results.               Mild OSA with AHI of 6. Also presence of Periodic Limb Movements. Recommend in-lab CPAP titration study.       Above message is incorrect.

## 2016-10-19 NOTE — Telephone Encounter (Signed)
LMOM for pt to return call to inform her that she is positive for sleep apnea with AHI of 18.7/hr and recommended for CPAP titration.

## 2016-10-20 NOTE — Telephone Encounter (Signed)
2nd message left for patient to call office for results of sleep study.

## 2016-10-20 NOTE — Addendum Note (Signed)
Addended by: Devona Konig on: 10/20/2016 03:35 PM   Modules accepted: Orders

## 2016-10-20 NOTE — Telephone Encounter (Signed)
Patient aware of results. She is aware that she needs titration study Sleep Lab will contact her. Orders entered. Nothing further needed.

## 2016-10-25 ENCOUNTER — Other Ambulatory Visit: Payer: Self-pay | Admitting: Internal Medicine

## 2016-10-25 MED FILL — CLOBETASOL 0.05% OINTMENT: 0.05 | 28 days supply | Qty: 45 | Fill #3

## 2016-10-26 MED FILL — BENAZEPRIL HCL 20 MG TABLET: 20 | 30 days supply | Qty: 30 | Fill #0

## 2016-11-29 ENCOUNTER — Ambulatory Visit: Payer: 59 | Attending: Internal Medicine

## 2016-11-29 DIAGNOSIS — Z6841 Body Mass Index (BMI) 40.0 and over, adult: Secondary | ICD-10-CM | POA: Diagnosis not present

## 2016-11-29 DIAGNOSIS — G4733 Obstructive sleep apnea (adult) (pediatric): Secondary | ICD-10-CM | POA: Insufficient documentation

## 2016-11-29 DIAGNOSIS — G4710 Hypersomnia, unspecified: Secondary | ICD-10-CM | POA: Diagnosis present

## 2016-11-29 DIAGNOSIS — I10 Essential (primary) hypertension: Secondary | ICD-10-CM | POA: Diagnosis not present

## 2016-11-30 DIAGNOSIS — G4733 Obstructive sleep apnea (adult) (pediatric): Secondary | ICD-10-CM | POA: Diagnosis not present

## 2016-12-01 ENCOUNTER — Telehealth: Payer: Self-pay | Admitting: *Deleted

## 2016-12-01 DIAGNOSIS — G4733 Obstructive sleep apnea (adult) (pediatric): Secondary | ICD-10-CM

## 2016-12-01 NOTE — Telephone Encounter (Signed)
LMOM for pt to return call. 

## 2016-12-01 NOTE — Telephone Encounter (Signed)
-----   Message from Laverle Hobby, MD sent at 11/30/2016  5:10 PM EDT ----- Regarding: CPAP titration results.   Auto-CPAP with pressure range of 10-15 cm H2O .FP Letta Moynahan, small/med

## 2016-12-04 ENCOUNTER — Other Ambulatory Visit: Payer: Self-pay | Admitting: Internal Medicine

## 2016-12-04 MED ORDER — BENAZEPRIL HCL 20 MG PO TABS: 20.0000 mg | ORAL_TABLET | Freq: Every day | ORAL | 0 refills | Status: DC

## 2016-12-04 MED FILL — LEVOTHYROXINE 100 MCG TABLE: 100 | 90 days supply | Qty: 90 | Fill #1

## 2016-12-04 MED FILL — BENAZEPRIL HCL 20 MG TABLET: 20 | 30 days supply | Qty: 30 | Fill #0

## 2016-12-04 MED FILL — HYDROCHLOROTHIAZIDE 25 MG T: 25 | 90 days supply | Qty: 90 | Fill #0

## 2016-12-04 NOTE — Telephone Encounter (Signed)
Tried to call pt but no answer and no VM available.

## 2016-12-05 ENCOUNTER — Encounter: Payer: Self-pay | Admitting: *Deleted

## 2016-12-05 ENCOUNTER — Encounter: Payer: Self-pay | Admitting: Internal Medicine

## 2016-12-05 ENCOUNTER — Ambulatory Visit (INDEPENDENT_AMBULATORY_CARE_PROVIDER_SITE_OTHER): Payer: 59 | Admitting: Internal Medicine

## 2016-12-05 VITALS — BP 136/82 | HR 67 | Temp 98.3°F | Wt 275.0 lb

## 2016-12-05 DIAGNOSIS — I10 Essential (primary) hypertension: Secondary | ICD-10-CM

## 2016-12-05 DIAGNOSIS — E7800 Pure hypercholesterolemia, unspecified: Secondary | ICD-10-CM

## 2016-12-05 DIAGNOSIS — E039 Hypothyroidism, unspecified: Secondary | ICD-10-CM

## 2016-12-05 LAB — LIPID PANEL
CHOL/HDL RATIO: 3
Cholesterol: 229 mg/dL — ABNORMAL HIGH (ref 0–200)
HDL: 71.1 mg/dL (ref >39.00)
LDL CALC: 136 mg/dL — AB (ref 0–99)
NONHDL: 157.61
TRIGLYCERIDES: 107 mg/dL (ref 0.0–149.0)
VLDL: 21.4 mg/dL (ref 0.0–40.0)

## 2016-12-05 LAB — COMPREHENSIVE METABOLIC PANEL
ALK PHOS: 80 U/L (ref 39–117)
ALT: 18 U/L (ref 0–35)
AST: 17 U/L (ref 0–37)
Albumin: 3.8 g/dL (ref 3.5–5.2)
BILIRUBIN TOTAL: 0.3 mg/dL (ref 0.2–1.2)
BUN: 15 mg/dL (ref 6–23)
CO2: 29 meq/L (ref 19–32)
CREATININE: 1.07 mg/dL (ref 0.40–1.20)
Calcium: 8.9 mg/dL (ref 8.4–10.5)
Chloride: 105 mEq/L (ref 96–112)
GFR: 66.85 mL/min (ref >60.00)
GLUCOSE: 85 mg/dL (ref 70–99)
Potassium: 3.8 mEq/L (ref 3.5–5.1)
Sodium: 143 mEq/L (ref 135–145)
TOTAL PROTEIN: 7.4 g/dL (ref 6.0–8.3)

## 2016-12-05 LAB — TSH: TSH: 5.95 u[IU]/mL — ABNORMAL HIGH (ref 0.35–4.50)

## 2016-12-05 MED ORDER — HYDROCHLOROTHIAZIDE 25 MG PO TABS: 25.0000 mg | ORAL_TABLET | Freq: Every day | ORAL | 1 refills | Status: DC

## 2016-12-05 NOTE — Patient Instructions (Signed)
Fat and Cholesterol Restricted Diet Getting too much fat and cholesterol in your diet may cause health problems. Following this diet helps keep your fat and cholesterol at normal levels. This can keep you from getting sick. What types of fat should I choose?  Choose monosaturated and polyunsaturated fats. These are found in foods such as olive oil, canola oil, flaxseeds, walnuts, almonds, and seeds.  Eat more omega-3 fats. Good choices include salmon, mackerel, sardines, tuna, flaxseed oil, and ground flaxseeds.  Limit saturated fats. These are in animal products such as meats, butter, and cream. They can also be in plant products such as palm oil, palm kernel oil, and coconut oil.  Avoid foods with partially hydrogenated oils in them. These contain trans fats. Examples of foods that have trans fats are stick margarine, some tub margarines, cookies, crackers, and other baked goods. What general guidelines do I need to follow?  Check food labels. Look for the words "trans fat" and "saturated fat."  When preparing a meal: ? Fill half of your plate with vegetables and green salads. ? Fill one fourth of your plate with whole grains. Look for the word "whole" as the first word in the ingredient list. ? Fill one fourth of your plate with lean protein foods.  Eat more foods that have fiber, like apples, carrots, beans, peas, and barley.  Eat more home-cooked foods. Eat less at restaurants and buffets.  Limit or avoid alcohol.  Limit foods high in starch and sugar.  Limit fried foods.  Cook foods without frying them. Baking, boiling, grilling, and broiling are all great options.  Lose weight if you are overweight. Losing even a small amount of weight can help your overall health. It can also help prevent diseases such as diabetes and heart disease. What foods can I eat? Grains Whole grains, such as whole wheat or whole grain breads, crackers, cereals, and pasta. Unsweetened oatmeal,  bulgur, barley, quinoa, or brown rice. Corn or whole wheat flour tortillas. Vegetables Fresh or frozen vegetables (raw, steamed, roasted, or grilled). Green salads. Fruits All fresh, canned (in natural juice), or frozen fruits. Meat and Other Protein Products Ground beef (85% or leaner), grass-fed beef, or beef trimmed of fat. Skinless chicken or turkey. Ground chicken or turkey. Pork trimmed of fat. All fish and seafood. Eggs. Dried beans, peas, or lentils. Unsalted nuts or seeds. Unsalted canned or dry beans. Dairy Low-fat dairy products, such as skim or 1% milk, 2% or reduced-fat cheeses, low-fat ricotta or cottage cheese, or plain low-fat yogurt. Fats and Oils Tub margarines without trans fats. Light or reduced-fat mayonnaise and salad dressings. Avocado. Olive, canola, sesame, or safflower oils. Natural peanut or almond butter (choose ones without added sugar and oil). The items listed above may not be a complete list of recommended foods or beverages. Contact your dietitian for more options. What foods are not recommended? Grains White bread. White pasta. White rice. Cornbread. Bagels, pastries, and croissants. Crackers that contain trans fat. Vegetables White potatoes. Corn. Creamed or fried vegetables. Vegetables in a cheese sauce. Fruits Dried fruits. Canned fruit in light or heavy syrup. Fruit juice. Meat and Other Protein Products Fatty cuts of meat. Ribs, chicken wings, bacon, sausage, bologna, salami, chitterlings, fatback, hot dogs, bratwurst, and packaged luncheon meats. Liver and organ meats. Dairy Whole or 2% milk, cream, half-and-half, and cream cheese. Whole milk cheeses. Whole-fat or sweetened yogurt. Full-fat cheeses. Nondairy creamers and whipped toppings. Processed cheese, cheese spreads, or cheese curds. Sweets and Desserts Corn   syrup, sugars, honey, and molasses. Candy. Jam and jelly. Syrup. Sweetened cereals. Cookies, pies, cakes, donuts, muffins, and ice  cream. Fats and Oils Butter, stick margarine, lard, shortening, ghee, or bacon fat. Coconut, palm kernel, or palm oils. Beverages Alcohol. Sweetened drinks (such as sodas, lemonade, and fruit drinks or punches). The items listed above may not be a complete list of foods and beverages to avoid. Contact your dietitian for more information. This information is not intended to replace advice given to you by your health care provider. Make sure you discuss any questions you have with your health care provider. Document Released: 09/05/2011 Document Revised: 11/11/2015 Document Reviewed: 06/05/2013 Elsevier Interactive Patient Education  2018 [employer]  

## 2016-12-05 NOTE — Telephone Encounter (Signed)
Noted  

## 2016-12-05 NOTE — Telephone Encounter (Signed)
Pt is returning your call from 9/14. Ok to leave a detailed message

## 2016-12-05 NOTE — Addendum Note (Signed)
Addended by: Oscar La R on: 12/05/2016 10:43 AM   Modules accepted: Orders

## 2016-12-05 NOTE — Progress Notes (Signed)
Subjective:    Patient ID: Judith Ross, female    DOB: 19-Jun-1955, 61 y.o.   MRN: [id number]  HPI  Pt presents to the clinic today to follow up HTN. At her last visit, her Benazepril was increased to 20 mg daily. She never returned for her follow up visit. She has been taking the medication as prescribed. She denies adverse side effects. Her BP today is 136/82. ECG from 09/2014 reviewed.  Her cholesterol was also elevated at her last visit, LDL was 154. Her Zocor was increased to 60 mg daily. She denies myalgias. She tries to consume a low fat diet.  She also needs to have her thyroid levels rechecked. Her TSH was mildly elevated but because Free T4 was normal and she was not having any symptoms, we did not adjust her Synthroid dose. She denies any concerns with her thyroid at this time.    Review of Systems      Past Medical History:  Diagnosis Date  . Arthritis   . Chicken pox   . Hyperlipidemia   . Hypertension   . Obesity   . Sleep apnea   . Thyroid disease     Current Outpatient Prescriptions  Medication Sig Dispense Refill  . benazepril (LOTENSIN) 20 MG tablet Take 1 tablet (20 mg total) by mouth daily. 30 tablet 0  . hydrochlorothiazide (HYDRODIURIL) 25 MG tablet TAKE 1 TABLET BY MOUTH DAILY. 90 tablet 0  . levothyroxine (SYNTHROID, LEVOTHROID) 100 MCG tablet Take 1 tablet (100 mcg total) by mouth daily. 90 tablet 2  . simvastatin (ZOCOR) 20 MG tablet Take 1 tablet (20 mg total) by mouth daily. 90 tablet 1  . simvastatin (ZOCOR) 40 MG tablet Take 1 tablet (40 mg total) by mouth daily. 90 tablet 1   No current facility-administered medications for this visit.     Allergies  Allergen Reactions  . Erythromycin Itching    Family History  Problem Relation Age of Onset  . Stroke Father   . Diabetes Maternal Grandmother   . Hypertension Maternal Grandmother   . Diabetes Maternal Uncle   . Cancer Maternal Aunt        Breast    Social History   Social  History  . Marital status: Divorced    Spouse name: N/A  . Number of children: N/A  . Years of education: N/A   Occupational History  . Not on file.   Social History Main Topics  . Smoking status: Never Smoker  . Smokeless tobacco: Never Used  . Alcohol use No  . Drug use: No  . Sexual activity: Yes   Other Topics Concern  . Not on file   Social History Narrative  . No narrative on file     Constitutional: Denies fever, malaise, fatigue, headache or abrupt weight changes.  Respiratory: Denies difficulty breathing, shortness of breath, cough or sputum production.   Cardiovascular: Denies chest pain, chest tightness, palpitations or swelling in the hands or feet.  Neurological: Denies dizziness, difficulty with memory, difficulty with speech or problems with balance and coordination.    No other specific complaints in a complete review of systems (except as listed in HPI above).  Objective:   Physical Exam   BP 136/82   Pulse 67   Temp 98.3 F (36.8 C) (Oral)   Wt 275 lb (124.7 kg)   SpO2 98%   BMI 45.07 kg/m  Wt Readings from Last 3 Encounters:  12/05/16 275 lb (124.7 kg)  09/19/16  272 lb (123.4 kg)  08/29/16 274 lb (124.3 kg)    General: Appears her stated age, obese in NAD. Neck:  Neck supple, trachea midline. No masses, lumps or present.  Cardiovascular: Normal rate and rhythm.  Pulmonary/Chest: Normal effort and positive vesicular breath sounds. No respiratory distress. No wheezes, rales or ronchi noted.  Neurological: Alert and oriented.     BMET    Component Value Date/Time   NA 142 08/29/2016 0839   K 4.3 08/29/2016 0839   CL 103 08/29/2016 0839   CO2 33 (H) 08/29/2016 0839   GLUCOSE 83 08/29/2016 0839   BUN 20 08/29/2016 0839   CREATININE 1.09 08/29/2016 0839   CALCIUM 8.9 08/29/2016 0839   GFRNONAA >60 01/10/2010 0915   GFRAA  01/10/2010 0915    >60        The eGFR has been calculated using the MDRD equation. This calculation has not  been validated in all clinical situations. eGFR's persistently <60 mL/min signify possible Chronic Kidney Disease.    Lipid Panel     Component Value Date/Time   CHOL 238 (H) 08/29/2016 0839   TRIG 123.0 08/29/2016 0839   HDL 59.10 08/29/2016 0839   CHOLHDL 4 08/29/2016 0839   VLDL 24.6 08/29/2016 0839   LDLCALC 154 (H) 08/29/2016 0839    CBC    Component Value Date/Time   WBC 6.9 08/29/2016 0839   RBC 5.29 (H) 08/29/2016 0839   HGB 14.8 08/29/2016 0839   HCT 45.2 08/29/2016 0839   PLT 289.0 08/29/2016 0839   MCV 85.3 08/29/2016 0839   MCH 28.1 01/18/2010 0530   MCHC 32.9 08/29/2016 0839   RDW 15.1 08/29/2016 0839    Hgb A1C Lab Results  Component Value Date   HGBA1C 6.0 08/29/2016           Assessment & Plan:

## 2016-12-05 NOTE — Assessment & Plan Note (Signed)
TSH today Will adjust and refill Synthroid based on labs

## 2016-12-05 NOTE — Assessment & Plan Note (Signed)
Better but still not at goal She is not interested in adjusting medication at this time Continue Benazepril for now Discussed DASH diet and exercise for weight loss CMET today

## 2016-12-05 NOTE — Telephone Encounter (Signed)
LMOM for pt that we are placing an order for CPAP set up. Order placed. Nothing further needed.

## 2016-12-05 NOTE — Telephone Encounter (Signed)
Tried to call pt but no answer and no VM available. Will mail letter due to multiple attempts to contact.

## 2016-12-05 NOTE — Assessment & Plan Note (Signed)
CMET and Lipid profile today Encouraged her to consume a low fat diet Continue Simvastatin for now 

## 2016-12-05 NOTE — Telephone Encounter (Signed)
Patient returning call. Aware the order for cpap was placed

## 2016-12-07 NOTE — Addendum Note (Signed)
Addended by: Lurlean Nanny on: 12/07/2016 05:29 PM   Modules accepted: Orders

## 2016-12-08 MED ORDER — LEVOTHYROXINE SODIUM 25 MCG PO TABS: 12.5000 ug | ORAL_TABLET | Freq: Every day | ORAL | 0 refills | Status: DC

## 2016-12-08 MED FILL — LEVOTHYROXINE 25 MCG TABLET: 25 | 30 days supply | Qty: 15 | Fill #0

## 2016-12-08 NOTE — Addendum Note (Signed)
Addended by: Lurlean Nanny on: 12/08/2016 11:32 AM   Modules accepted: Orders

## 2017-01-02 ENCOUNTER — Ambulatory Visit: Payer: 59 | Admitting: Internal Medicine

## 2017-01-05 ENCOUNTER — Other Ambulatory Visit (INDEPENDENT_AMBULATORY_CARE_PROVIDER_SITE_OTHER): Payer: 59

## 2017-01-05 DIAGNOSIS — E7800 Pure hypercholesterolemia, unspecified: Secondary | ICD-10-CM | POA: Diagnosis not present

## 2017-01-05 LAB — TSH: TSH: 3.64 u[IU]/mL (ref 0.35–4.50)

## 2017-01-30 DIAGNOSIS — M722 Plantar fascial fibromatosis: Secondary | ICD-10-CM | POA: Diagnosis not present

## 2017-01-30 DIAGNOSIS — M7732 Calcaneal spur, left foot: Secondary | ICD-10-CM | POA: Diagnosis not present

## 2017-01-30 DIAGNOSIS — M71572 Other bursitis, not elsewhere classified, left ankle and foot: Secondary | ICD-10-CM | POA: Diagnosis not present

## 2017-02-06 DIAGNOSIS — M71572 Other bursitis, not elsewhere classified, left ankle and foot: Secondary | ICD-10-CM | POA: Diagnosis not present

## 2017-02-06 DIAGNOSIS — M722 Plantar fascial fibromatosis: Secondary | ICD-10-CM | POA: Diagnosis not present

## 2017-02-12 ENCOUNTER — Other Ambulatory Visit: Payer: Self-pay | Admitting: Internal Medicine

## 2017-02-12 MED FILL — LEVOTHYROXINE 25 MCG TABLET: 25 | 90 days supply | Qty: 45 | Fill #0

## 2017-02-20 DIAGNOSIS — M71572 Other bursitis, not elsewhere classified, left ankle and foot: Secondary | ICD-10-CM | POA: Diagnosis not present

## 2017-02-20 DIAGNOSIS — M722 Plantar fascial fibromatosis: Secondary | ICD-10-CM | POA: Diagnosis not present

## 2017-02-21 DIAGNOSIS — M25561 Pain in right knee: Secondary | ICD-10-CM | POA: Diagnosis not present

## 2017-02-21 DIAGNOSIS — M1712 Unilateral primary osteoarthritis, left knee: Secondary | ICD-10-CM | POA: Diagnosis not present

## 2017-02-21 DIAGNOSIS — M25562 Pain in left knee: Secondary | ICD-10-CM | POA: Diagnosis not present

## 2017-02-21 DIAGNOSIS — M170 Bilateral primary osteoarthritis of knee: Secondary | ICD-10-CM | POA: Diagnosis not present

## 2017-03-06 DIAGNOSIS — M25562 Pain in left knee: Secondary | ICD-10-CM | POA: Diagnosis not present

## 2017-03-06 DIAGNOSIS — M1712 Unilateral primary osteoarthritis, left knee: Secondary | ICD-10-CM | POA: Diagnosis not present

## 2017-03-14 DIAGNOSIS — M25562 Pain in left knee: Secondary | ICD-10-CM | POA: Diagnosis not present

## 2017-03-14 DIAGNOSIS — M1712 Unilateral primary osteoarthritis, left knee: Secondary | ICD-10-CM | POA: Diagnosis not present

## 2017-03-15 ENCOUNTER — Other Ambulatory Visit: Payer: Self-pay | Admitting: Obstetrics and Gynecology

## 2017-03-15 ENCOUNTER — Other Ambulatory Visit: Payer: Self-pay | Admitting: Internal Medicine

## 2017-03-15 DIAGNOSIS — M722 Plantar fascial fibromatosis: Secondary | ICD-10-CM | POA: Diagnosis not present

## 2017-03-15 DIAGNOSIS — M71572 Other bursitis, not elsewhere classified, left ankle and foot: Secondary | ICD-10-CM | POA: Diagnosis not present

## 2017-03-15 MED FILL — SIMVASTATIN 20 MG TABLET: 20 | 90 days supply | Qty: 90 | Fill #1

## 2017-03-15 MED FILL — BENAZEPRIL HCL 20 MG TABLET: 20 | 30 days supply | Qty: 30 | Fill #0

## 2017-03-23 DIAGNOSIS — M25562 Pain in left knee: Secondary | ICD-10-CM | POA: Diagnosis not present

## 2017-03-23 DIAGNOSIS — M1712 Unilateral primary osteoarthritis, left knee: Secondary | ICD-10-CM | POA: Diagnosis not present

## 2017-03-26 ENCOUNTER — Telehealth: Payer: Self-pay | Admitting: Internal Medicine

## 2017-03-26 NOTE — Telephone Encounter (Signed)
Patient notified as instructed by telephone and verbalized understanding. Patient stated that she is going to [facility] and getting injections for her knee.. Patient stated that she will contact them to see if they will do this for her.

## 2017-03-26 NOTE — Telephone Encounter (Signed)
Sent back to provider

## 2017-03-26 NOTE — Telephone Encounter (Signed)
Copied from [place]. Topic: Quick Communication - See Telephone Encounter >> Mar 26, 2017 11:01 AM Boyd Kerbs wrote: CRM for notification. See Telephone encounter for:  Patient is asking for longer time on Handicap sticker. She is needing a new note for this to take to [facility].   03/26/17.

## 2017-03-26 NOTE — Telephone Encounter (Signed)
Is she still seeing ortho? She should get this from them if she is knee trouble.

## 2017-03-29 ENCOUNTER — Ambulatory Visit (INDEPENDENT_AMBULATORY_CARE_PROVIDER_SITE_OTHER)
Admission: RE | Admit: 2017-03-29 | Discharge: 2017-03-29 | Disposition: A | Discharge status: Home / Self Care | Payer: 59 | Source: Ambulatory Visit | Attending: Internal Medicine | Admitting: Internal Medicine

## 2017-03-29 ENCOUNTER — Encounter: Payer: Self-pay | Admitting: Internal Medicine

## 2017-03-29 ENCOUNTER — Ambulatory Visit: Payer: 59 | Admitting: Internal Medicine

## 2017-03-29 VITALS — BP 128/84 | HR 70 | Temp 97.9°F | Wt 274.0 lb

## 2017-03-29 DIAGNOSIS — M47812 Spondylosis without myelopathy or radiculopathy, cervical region: Secondary | ICD-10-CM | POA: Diagnosis not present

## 2017-03-29 DIAGNOSIS — M545 Low back pain, unspecified: Secondary | ICD-10-CM

## 2017-03-29 DIAGNOSIS — M546 Pain in thoracic spine: Secondary | ICD-10-CM

## 2017-03-29 NOTE — Progress Notes (Signed)
Subjective:    Patient ID: Judith Ross, female    DOB: [birth year], 62 y.o.   MRN: [id number]  HPI  Pt presents to the clinic today with c/o low back pain. She reports this started 3 months ago. She describes the pain as sore and achy. The pain radiates up to her mid back. She denies numbness, tingling or weakness in her legs. She denies urinary, vaginal or GI complaints. She denies any injury to the area. She has tried Aleve and Ibuprofen with minimal relief.  Review of Systems      Past Medical History:  Diagnosis Date  . Arthritis   . Chicken pox   . Hyperlipidemia   . Hypertension   . Obesity   . Sleep apnea   . Thyroid disease     Current Outpatient Medications  Medication Sig Dispense Refill  . benazepril (LOTENSIN) 20 MG tablet TAKE 1 TABLET (20 MG TOTAL) BY MOUTH DAILY. 30 tablet 3  . hydrochlorothiazide (HYDRODIURIL) 25 MG tablet Take 1 tablet (25 mg total) by mouth daily. 90 tablet 1  . levothyroxine (SYNTHROID, LEVOTHROID) 100 MCG tablet Take 1 tablet (100 mcg total) by mouth daily. 90 tablet 2  . levothyroxine (SYNTHROID, LEVOTHROID) 25 MCG tablet TAKE 1/2 TABLET BY MOUTH DAILY BEFORE BREAKFAST. (TO BE TAKEN WITH 100MCG TABLET) 45 tablet 2  . simvastatin (ZOCOR) 20 MG tablet Take 1 tablet (20 mg total) by mouth daily. 90 tablet 1  . simvastatin (ZOCOR) 40 MG tablet Take 1 tablet (40 mg total) by mouth daily. 90 tablet 1   No current facility-administered medications for this visit.     Allergies  Allergen Reactions  . Erythromycin Itching    Family History  Problem Relation Age of Onset  . Stroke Father   . Diabetes Maternal Grandmother   . Hypertension Maternal Grandmother   . Diabetes Maternal Uncle   . Cancer Maternal Aunt        Breast    Social History   Socioeconomic History  . Marital status: Divorced    Spouse name: Not on file  . Number of children: Not on file  . Years of education: Not on file  . Highest education level: Not on  file  Social Needs  . Financial resource strain: Not on file  . Food insecurity - worry: Not on file  . Food insecurity - inability: Not on file  . Transportation needs - medical: Not on file  . Transportation needs - non-medical: Not on file  Occupational History  . Not on file  Tobacco Use  . Smoking status: Never Smoker  . Smokeless tobacco: Never Used  Substance and Sexual Activity  . Alcohol use: No    Alcohol/week: 0.0 oz  . Drug use: No  . Sexual activity: Yes  Other Topics Concern  . Not on file  Social History Narrative  . Not on file     Constitutional: Denies fever, malaise, fatigue, headache or abrupt weight changes.  Musculoskeletal: Pt reports low back pain. Denies decrease in range of motion, difficulty with gait, muscle pain or joint swelling.  Neurological: Denies dizziness, difficulty with memory, difficulty with speech or problems with balance and coordination.   No other specific complaints in a complete review of systems (except as listed in HPI above).  Objective:   Physical Exam   BP 128/84   Pulse 70   Temp 97.9 F (36.6 C) (Oral)   Wt 274 lb (124.3 kg)   SpO2 98%  BMI 44.90 kg/m  Wt Readings from Last 3 Encounters:  03/29/17 274 lb (124.3 kg)  12/05/16 275 lb (124.7 kg)  09/19/16 272 lb (123.4 kg)    General: Appears her stated age, obese in NAD. Musculoskeletal: Normal flexion, extension and rotation of the spine. Bony tenderness noted over the spine. She has pain with palpation of the thoracic and lumbar paraspinal muscles. Strength 5/5 BLE. No difficulty with gait.  Neurological: Alert and oriented. Sensation intact to BLE.  BMET    Component Value Date/Time   NA 143 12/05/2016 0826   K 3.8 12/05/2016 0826   CL 105 12/05/2016 0826   CO2 29 12/05/2016 0826   GLUCOSE 85 12/05/2016 0826   BUN 15 12/05/2016 0826   CREATININE 1.07 12/05/2016 0826   CALCIUM 8.9 12/05/2016 0826   GFRNONAA >60 01/10/2010 0915   GFRAA  01/10/2010  0915    >60        The eGFR has been calculated using the MDRD equation. This calculation has not been validated in all clinical situations. eGFR's persistently <60 mL/min signify possible Chronic Kidney Disease.    Lipid Panel     Component Value Date/Time   CHOL 229 (H) 12/05/2016 0826   TRIG 107.0 12/05/2016 0826   HDL 71.10 12/05/2016 0826   CHOLHDL 3 12/05/2016 0826   VLDL 21.4 12/05/2016 0826   LDLCALC 136 (H) 12/05/2016 0826    CBC    Component Value Date/Time   WBC 6.9 08/29/2016 0839   RBC 5.29 (H) 08/29/2016 0839   HGB 14.8 08/29/2016 0839   HCT 45.2 08/29/2016 0839   PLT 289.0 08/29/2016 0839   MCV 85.3 08/29/2016 0839   MCH 28.1 01/18/2010 0530   MCHC 32.9 08/29/2016 0839   RDW 15.1 08/29/2016 0839    Hgb A1C Lab Results  Component Value Date   HGBA1C 6.0 08/29/2016          Assessment & Plan:   Thoracic and Lumbar Back Pain:  Continue NSAIDS Discussed the role of heat and stretching She declines referral for PT Xray of thoracic and lumbar spine today Discussed how weight loss could help improve her back pain  Will follow up after labs, return precautions discussed Webb Silversmith, NP

## 2017-03-29 NOTE — Patient Instructions (Signed)

## 2017-04-03 DIAGNOSIS — M1712 Unilateral primary osteoarthritis, left knee: Secondary | ICD-10-CM | POA: Diagnosis not present

## 2017-04-03 DIAGNOSIS — M25562 Pain in left knee: Secondary | ICD-10-CM | POA: Diagnosis not present

## 2017-05-01 MED FILL — LEVOTHYROXINE 100 MCG TABLE: 100 | 90 days supply | Qty: 90 | Fill #2

## 2017-05-14 ENCOUNTER — Other Ambulatory Visit: Payer: Self-pay | Admitting: Obstetrics and Gynecology

## 2017-05-14 ENCOUNTER — Telehealth: Payer: Self-pay

## 2017-05-14 MED ORDER — CLOBETASOL PROPIONATE 0.05 % EX OINT
TOPICAL_OINTMENT | CUTANEOUS | 5 refills | Status: DC
Start: 2017-05-14 — End: 2021-02-16

## 2017-05-14 MED FILL — CLOBETASOL 0.05% OINTMENT: 0.05 | 28 days supply | Qty: 30 | Fill #0

## 2017-05-14 NOTE — Telephone Encounter (Signed)
Please advise, I did not see it on current medication list

## 2017-05-14 NOTE — Telephone Encounter (Signed)
Has been sent.

## 2017-05-14 NOTE — Telephone Encounter (Signed)
Pt requesting refill of clobetasol ointment.  AEX sched 05/21/17.  Pharm is correct in chart.  [phone]

## 2017-05-21 ENCOUNTER — Ambulatory Visit: Payer: Self-pay | Admitting: Obstetrics and Gynecology

## 2017-06-18 MED FILL — BENAZEPRIL HCL 20 MG TABLET: 20 | 30 days supply | Qty: 30 | Fill #1

## 2017-07-11 MED FILL — SIMVASTATIN 40 MG TABLET: 40 | 90 days supply | Qty: 90 | Fill #1

## 2017-07-11 MED FILL — HYDROCHLOROTHIAZIDE 25 MG T: 25 | 90 days supply | Qty: 90 | Fill #0

## 2017-08-09 MED FILL — BENAZEPRIL HCL 20 MG TABLET: 20 | 30 days supply | Qty: 30 | Fill #2

## 2017-08-09 MED FILL — LEVOTHYROXINE 25 MCG TABLET: 25 | 90 days supply | Qty: 45 | Fill #1

## 2017-09-04 ENCOUNTER — Encounter: Payer: Self-pay | Admitting: Internal Medicine

## 2017-10-02 DIAGNOSIS — M1712 Unilateral primary osteoarthritis, left knee: Secondary | ICD-10-CM | POA: Diagnosis not present

## 2017-10-02 DIAGNOSIS — M25562 Pain in left knee: Secondary | ICD-10-CM | POA: Diagnosis not present

## 2017-10-10 MED FILL — BENAZEPRIL HCL 20 MG TABLET: 20 | 30 days supply | Qty: 30 | Fill #3

## 2017-10-23 ENCOUNTER — Other Ambulatory Visit: Payer: Self-pay | Admitting: Internal Medicine

## 2017-10-24 MED FILL — LEVOTHYROXINE 100 MCG TABLE: 100 | 90 days supply | Qty: 90 | Fill #0

## 2017-10-30 ENCOUNTER — Encounter: Payer: 59 | Admitting: Internal Medicine

## 2017-12-18 ENCOUNTER — Other Ambulatory Visit: Payer: Self-pay | Admitting: Internal Medicine

## 2017-12-18 DIAGNOSIS — M1712 Unilateral primary osteoarthritis, left knee: Secondary | ICD-10-CM | POA: Diagnosis not present

## 2017-12-18 MED FILL — CLOBETASOL PROPIONATE 0.05: 0.05 | 28 days supply | Qty: 30 | Fill #1

## 2017-12-20 MED FILL — BENAZEPRIL HCL 20 MG TABLET: 20 | 30 days supply | Qty: 30 | Fill #0

## 2018-01-08 ENCOUNTER — Encounter: Payer: Self-pay | Admitting: Internal Medicine

## 2018-01-08 ENCOUNTER — Ambulatory Visit: Payer: 59 | Admitting: Internal Medicine

## 2018-01-08 VITALS — BP 138/92 | HR 68 | Temp 98.0°F | Wt 276.0 lb

## 2018-01-08 DIAGNOSIS — E559 Vitamin D deficiency, unspecified: Secondary | ICD-10-CM | POA: Diagnosis not present

## 2018-01-08 DIAGNOSIS — M1990 Unspecified osteoarthritis, unspecified site: Secondary | ICD-10-CM | POA: Diagnosis not present

## 2018-01-08 DIAGNOSIS — E890 Postprocedural hypothyroidism: Secondary | ICD-10-CM

## 2018-01-08 DIAGNOSIS — G4733 Obstructive sleep apnea (adult) (pediatric): Secondary | ICD-10-CM

## 2018-01-08 DIAGNOSIS — R7303 Prediabetes: Secondary | ICD-10-CM | POA: Diagnosis not present

## 2018-01-08 DIAGNOSIS — I10 Essential (primary) hypertension: Secondary | ICD-10-CM

## 2018-01-08 DIAGNOSIS — E7800 Pure hypercholesterolemia, unspecified: Secondary | ICD-10-CM | POA: Diagnosis not present

## 2018-01-08 DIAGNOSIS — Z0001 Encounter for general adult medical examination with abnormal findings: Secondary | ICD-10-CM

## 2018-01-08 LAB — HEMOGLOBIN A1C: Hgb A1c MFr Bld: 5.9 % (ref 4.6–6.5)

## 2018-01-08 LAB — LIPID PANEL
CHOLESTEROL: 229 mg/dL — AB (ref 0–200)
HDL: 61.4 mg/dL (ref >39.00)
LDL CALC: 148 mg/dL — AB (ref 0–99)
NonHDL: 167.21
TRIGLYCERIDES: 95 mg/dL (ref 0.0–149.0)
Total CHOL/HDL Ratio: 4
VLDL: 19 mg/dL (ref 0.0–40.0)

## 2018-01-08 LAB — CBC
HEMATOCRIT: 45.3 % (ref 36.0–46.0)
HEMOGLOBIN: 15 g/dL (ref 12.0–15.0)
MCHC: 33.2 g/dL (ref 30.0–36.0)
MCV: 84.3 fl (ref 78.0–100.0)
Platelets: 304 10*3/uL (ref 150.0–400.0)
RBC: 5.38 Mil/uL — AB (ref 3.87–5.11)
RDW: 14.2 % (ref 11.5–15.5)
WBC: 5.2 10*3/uL (ref 4.0–10.5)

## 2018-01-08 LAB — COMPREHENSIVE METABOLIC PANEL
ALBUMIN: 3.9 g/dL (ref 3.5–5.2)
ALT: 22 U/L (ref 0–35)
AST: 17 U/L (ref 0–37)
Alkaline Phosphatase: 80 U/L (ref 39–117)
BUN: 14 mg/dL (ref 6–23)
CHLORIDE: 101 meq/L (ref 96–112)
CO2: 31 mEq/L (ref 19–32)
CREATININE: 0.91 mg/dL (ref 0.40–1.20)
Calcium: 8.7 mg/dL (ref 8.4–10.5)
GFR: 80.3 mL/min (ref >60.00)
Glucose, Bld: 97 mg/dL (ref 70–99)
POTASSIUM: 3.8 meq/L (ref 3.5–5.1)
Sodium: 140 mEq/L (ref 135–145)
Total Bilirubin: 0.4 mg/dL (ref 0.2–1.2)
Total Protein: 7.4 g/dL (ref 6.0–8.3)

## 2018-01-08 LAB — T4, FREE: FREE T4: 0.9 ng/dL (ref 0.60–1.60)

## 2018-01-08 LAB — VITAMIN D 25 HYDROXY (VIT D DEFICIENCY, FRACTURES): VITD: 21.08 ng/mL — ABNORMAL LOW (ref 30.00–100.00)

## 2018-01-08 LAB — TSH: TSH: 6.14 u[IU]/mL — AB (ref 0.35–4.50)

## 2018-01-08 NOTE — Assessment & Plan Note (Signed)
CMET and Lipid profile today Encouraged herto consume a low fat diet Continue Simvastatin for now, will adjust if needed based on labs 

## 2018-01-08 NOTE — Assessment & Plan Note (Signed)
Needing bilateral knee replacements Discussed Meloxicam, but she declines at this time Encouraged weight loss She will continue to follow with orthopedics

## 2018-01-08 NOTE — Assessment & Plan Note (Signed)
TSH and Free T4 today Will adjust Levothyroxine if needed based on labs She will need a refill of Levothyroxine once labs are back

## 2018-01-08 NOTE — Patient Instructions (Signed)
Health Maintenance for Postmenopausal Women Menopause is a normal process in which your reproductive ability comes to an end. This process happens gradually over a span of months to years, usually between the ages of 22 and 9. Menopause is complete when you have missed 12 consecutive menstrual periods. It is important to talk with your health care provider about some of the most common conditions that affect postmenopausal women, such as heart disease, cancer, and bone loss (osteoporosis). Adopting a healthy lifestyle and getting preventive care can help to promote your health and wellness. Those actions can also lower your chances of developing some of these common conditions. What should I know about menopause? During menopause, you may experience a number of symptoms, such as:  Moderate-to-severe hot flashes.  Night sweats.  Decrease in sex drive.  Mood swings.  Headaches.  Tiredness.  Irritability.  Memory problems.  Insomnia.  Choosing to treat or not to treat menopausal changes is an individual decision that you make with your health care provider. What should I know about hormone replacement therapy and supplements? Hormone therapy products are effective for treating symptoms that are associated with menopause, such as hot flashes and night sweats. Hormone replacement carries certain risks, especially as you become older. If you are thinking about using estrogen or estrogen with progestin treatments, discuss the benefits and risks with your health care provider. What should I know about heart disease and stroke? Heart disease, heart attack, and stroke become more likely as you age. This may be due, in part, to the hormonal changes that your body experiences during menopause. These can affect how your body processes dietary fats, triglycerides, and cholesterol. Heart attack and stroke are both medical emergencies. There are many things that you can do to help prevent heart disease  and stroke:  Have your blood pressure checked at least every 1-2 years. High blood pressure causes heart disease and increases the risk of stroke.  If you are 53-22 years old, ask your health care provider if you should take aspirin to prevent a heart attack or a stroke.  Do not use any tobacco products, including cigarettes, chewing tobacco, or electronic cigarettes. If you need help quitting, ask your health care provider.  It is important to eat a healthy diet and maintain a healthy weight. ? Be sure to include plenty of vegetables, fruits, low-fat dairy products, and lean protein. ? Avoid eating foods that are high in solid fats, added sugars, or salt (sodium).  Get regular exercise. This is one of the most important things that you can do for your health. ? Try to exercise for at least 150 minutes each week. The type of exercise that you do should increase your heart rate and make you sweat. This is known as moderate-intensity exercise. ? Try to do strengthening exercises at least twice each week. Do these in addition to the moderate-intensity exercise.  Know your numbers.Ask your health care provider to check your cholesterol and your blood glucose. Continue to have your blood tested as directed by your health care provider.  What should I know about cancer screening? There are several types of cancer. Take the following steps to reduce your risk and to catch any cancer development as early as possible. Breast Cancer  Practice breast self-awareness. ? This means understanding how your breasts normally appear and feel. ? It also means doing regular breast self-exams. Let your health care provider know about any changes, no matter how small.  If you are 40  or older, have a clinician do a breast exam (clinical breast exam or CBE) every year. Depending on your age, family history, and medical history, it may be recommended that you also have a yearly breast X-ray (mammogram).  If you  have a family history of breast cancer, talk with your health care provider about genetic screening.  If you are at high risk for breast cancer, talk with your health care provider about having an MRI and a mammogram every year.  Breast cancer (BRCA) gene test is recommended for women who have family members with BRCA-related cancers. Results of the assessment will determine the need for genetic counseling and BRCA1 and for BRCA2 testing. BRCA-related cancers include these types: ? Breast. This occurs in males or females. ? Ovarian. ? Tubal. This may also be called fallopian tube cancer. ? Cancer of the abdominal or pelvic lining (peritoneal cancer). ? Prostate. ? Pancreatic.  Cervical, Uterine, and Ovarian Cancer Your health care provider may recommend that you be screened regularly for cancer of the pelvic organs. These include your ovaries, uterus, and vagina. This screening involves a pelvic exam, which includes checking for microscopic changes to the surface of your cervix (Pap test).  For women ages 21-65, health care providers may recommend a pelvic exam and a Pap test every three years. For women ages 79-65, they may recommend the Pap test and pelvic exam, combined with testing for human papilloma virus (HPV), every five years. Some types of HPV increase your risk of cervical cancer. Testing for HPV may also be done on women of any age who have unclear Pap test results.  Other health care providers may not recommend any screening for nonpregnant women who are considered low risk for pelvic cancer and have no symptoms. Ask your health care provider if a screening pelvic exam is right for you.  If you have had past treatment for cervical cancer or a condition that could lead to cancer, you need Pap tests and screening for cancer for at least 20 years after your treatment. If Pap tests have been discontinued for you, your risk factors (such as having a new sexual partner) need to be  reassessed to determine if you should start having screenings again. Some women have medical problems that increase the chance of getting cervical cancer. In these cases, your health care provider may recommend that you have screening and Pap tests more often.  If you have a family history of uterine cancer or ovarian cancer, talk with your health care provider about genetic screening.  If you have vaginal bleeding after reaching menopause, tell your health care provider.  There are currently no reliable tests available to screen for ovarian cancer.  Lung Cancer Lung cancer screening is recommended for adults 69-62 years old who are at high risk for lung cancer because of a history of smoking. A yearly low-dose CT scan of the lungs is recommended if you:  Currently smoke.  Have a history of at least 30 pack-years of smoking and you currently smoke or have quit within the past 15 years. A pack-year is smoking an average of one pack of cigarettes per day for one year.  Yearly screening should:  Continue until it has been 15 years since you quit.  Stop if you develop a health problem that would prevent you from having lung cancer treatment.  Colorectal Cancer  This type of cancer can be detected and can often be prevented.  Routine colorectal cancer screening usually begins at  age 42 and continues through age 45.  If you have risk factors for colon cancer, your health care provider may recommend that you be screened at an earlier age.  If you have a family history of colorectal cancer, talk with your health care provider about genetic screening.  Your health care provider may also recommend using home test kits to check for hidden blood in your stool.  A small camera at the end of a tube can be used to examine your colon directly (sigmoidoscopy or colonoscopy). This is done to check for the earliest forms of colorectal cancer.  Direct examination of the colon should be repeated every  5-10 years until age 71. However, if early forms of precancerous polyps or small growths are found or if you have a family history or genetic risk for colorectal cancer, you may need to be screened more often.  Skin Cancer  Check your skin from head to toe regularly.  Monitor any moles. Be sure to tell your health care provider: ? About any new moles or changes in moles, especially if there is a change in a mole's shape or color. ? If you have a mole that is larger than the size of a pencil eraser.  If any of your family members has a history of skin cancer, especially at a young age, talk with your health care provider about genetic screening.  Always use sunscreen. Apply sunscreen liberally and repeatedly throughout the day.  Whenever you are outside, protect yourself by wearing long sleeves, pants, a wide-brimmed hat, and sunglasses.  What should I know about osteoporosis? Osteoporosis is a condition in which bone destruction happens more quickly than new bone creation. After menopause, you may be at an increased risk for osteoporosis. To help prevent osteoporosis or the bone fractures that can happen because of osteoporosis, the following is recommended:  If you are 46-71 years old, get at least 1,000 mg of calcium and at least 600 mg of vitamin D per day.  If you are older than age 55 but younger than age 65, get at least 1,200 mg of calcium and at least 600 mg of vitamin D per day.  If you are older than age 54, get at least 1,200 mg of calcium and at least 800 mg of vitamin D per day.  Smoking and excessive alcohol intake increase the risk of osteoporosis. Eat foods that are rich in calcium and vitamin D, and do weight-bearing exercises several times each week as directed by your health care provider. What should I know about how menopause affects my mental health? Depression may occur at any age, but it is more common as you become older. Common symptoms of depression  include:  Low or sad mood.  Changes in sleep patterns.  Changes in appetite or eating patterns.  Feeling an overall lack of motivation or enjoyment of activities that you previously enjoyed.  Frequent crying spells.  Talk with your health care provider if you think that you are experiencing depression. What should I know about immunizations? It is important that you get and maintain your immunizations. These include:  Tetanus, diphtheria, and pertussis (Tdap) booster vaccine.  Influenza every year before the flu season begins.  Pneumonia vaccine.  Shingles vaccine.  Your health care provider may also recommend other immunizations. This information is not intended to replace advice given to you by your health care provider. Make sure you discuss any questions you have with your health care provider. Document Released: 04/28/2005  Document Revised: 09/24/2015 Document Reviewed: 12/08/2014 Elsevier Interactive Patient Education  2018 [employer]vier Inc.  

## 2018-01-08 NOTE — Assessment & Plan Note (Signed)
A1C today Encouraged low carb diet and exercise for weight loss 

## 2018-01-08 NOTE — Assessment & Plan Note (Signed)
Not wearing CPAP Discussed how weight loss could help improve symptoms

## 2018-01-08 NOTE — Progress Notes (Signed)
Subjective:    Patient ID: Judith Ross, female    DOB: 03-01-1956, 62 y.o.   MRN: [id number]  HPI  Pt presents to the clinic today for her annual exam. She is also due to follow up chronic conditions.  Arthritis: Mainly in her knees. She takes Ibuprofen, Tylenol, BC as needed with minimal relief. She saw orthopedics earlier this month and he recommended she have bilateral knee replacements.  HTN: Her BP today is 138/92 (she reports she only took her medicine 15 minutes ago). She is taking Benazepril and HCTZ as prescribed. ECG from 09/2014 reviewed.  HLD: Her last LDL was 136, 11/2016. She denies myalgias on Simvastatin. She does not consume a low fat diet.  OSA: She averages 5 hours of sleep without the use of her CPAP. She does not feel tired during the day but reports she does nap during the day. Sleep study from 11/2016 reviewed.  Prediabetes: Her last A1C was 6%, 11/2016. She is not taking any medication for diabetes at this time.  Hypothyroidism: She denies any issues on her current dose of Levothyroxine. She is due to have her labs repeated today.  Flu: 11/2017 Tetanus: 09/2014 Zostavax: never Shingrix: never Mammogram: > 10 years ago Pap Smear: hysterectomy Bone Density: > 10 years ago Colon Screening: never Vision Screening: as needed Dentist: as needed  Diet: She does eat meat. She consumes more veggies than fruits. She does eat fried foods. She drinks mostly sweet tea, Gatorade, water. Exercise: None  Review of Systems      Past Medical History:  Diagnosis Date  . Arthritis   . Chicken pox   . Hyperlipidemia   . Hypertension   . Obesity   . Sleep apnea   . Thyroid disease     Current Outpatient Medications  Medication Sig Dispense Refill  . benazepril (LOTENSIN) 20 MG tablet TAKE 1 TABLET (20 MG TOTAL) BY MOUTH DAILY. 30 tablet 0  . clobetasol ointment (TEMOVATE) 0.05 % Apply to affected area every night for 4 weeks, then every other day for 4 weeks and  then twice a week for 4 weeks or until resolution. 30 g 5  . hydrochlorothiazide (HYDRODIURIL) 25 MG tablet Take 1 tablet (25 mg total) by mouth daily. 90 tablet 1  . levothyroxine (SYNTHROID, LEVOTHROID) 100 MCG tablet TAKE 1 TABLET BY MOUTH DAILY 90 tablet 0  . levothyroxine (SYNTHROID, LEVOTHROID) 25 MCG tablet TAKE 1/2 TABLET BY MOUTH DAILY BEFORE BREAKFAST. (TO BE TAKEN WITH 100MCG TABLET) 45 tablet 2  . simvastatin (ZOCOR) 20 MG tablet Take 1 tablet (20 mg total) by mouth daily. 90 tablet 1  . simvastatin (ZOCOR) 40 MG tablet Take 1 tablet (40 mg total) by mouth daily. 90 tablet 1   No current facility-administered medications for this visit.     Allergies  Allergen Reactions  . Erythromycin Itching    Family History  Problem Relation Age of Onset  . Stroke Father   . Diabetes Maternal Grandmother   . Hypertension Maternal Grandmother   . Diabetes Maternal Uncle   . Cancer Maternal Aunt        Breast    Social History   Socioeconomic History  . Marital status: Divorced    Spouse name: Not on file  . Number of children: Not on file  . Years of education: Not on file  . Highest education level: Not on file  Occupational History  . Not on file  Social Needs  . 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 Food insecurity:    Worry: Not on file    Inability: Not on file  . Transportation needs:    Medical: Not on file    Non-medical: Not on file  Tobacco Use  . Smoking status: Never Smoker  . Smokeless tobacco: Never Used  Substance and Sexual Activity  . Alcohol use: No    Alcohol/week: 0.0 standard drinks  . Drug use: No  . Sexual activity: Yes  Lifestyle  . Physical activity:    Days per week: Not on file    Minutes per session: Not on file  . Stress: Not on file  Relationships  . Social connections:    Talks on phone: Not on file    Gets together: Not on file    Attends religious service: Not on file    Active member of club or organization: Not on file      Attends meetings of clubs or organizations: Not on file    Relationship status: Not on file  . Intimate partner violence:    Fear of current or ex partner: Not on file    Emotionally abused: Not on file    Physically abused: Not on file    Forced sexual activity: Not on file  Other Topics Concern  . Not on file  Social History Narrative  . Not on file     Constitutional: Denies fever, malaise, fatigue, headache or abrupt weight changes.  HEENT: Denies eye pain, eye redness, ear pain, ringing in the ears, wax buildup, runny nose, nasal congestion, bloody nose, or sore throat. Respiratory: Denies difficulty breathing, shortness of breath, cough or sputum production.   Cardiovascular: Denies chest pain, chest tightness, palpitations or swelling in the hands or feet.  Gastrointestinal: Denies abdominal pain, bloating, constipation, diarrhea or blood in the stool.  GU: Denies urgency, frequency, pain with urination, burning sensation, blood in urine, odor or discharge. Musculoskeletal: Pt reports joint pain. Denies decrease in range of motion, difficulty with gait, muscle pain or joint swelling.  Skin: Denies redness, rashes, lesions or ulcercations.  Neurological: Denies dizziness, difficulty with memory, difficulty with speech or problems with balance and coordination.  Psych: Denies anxiety, depression, SI/HI.  No other specific complaints in a complete review of systems (except as listed in HPI above).  Objective:   Physical Exam  BP (!) 138/92   Pulse 68   Temp 98 F (36.7 C) (Oral)   Wt 276 lb (125.2 kg)   SpO2 98%   BMI 45.23 kg/m  Wt Readings from Last 3 Encounters:  01/08/18 276 lb (125.2 kg)  03/29/17 274 lb (124.3 kg)  12/05/16 275 lb (124.7 kg)    General: Appears her stated age, obese, in NAD. Skin: Warm, dry and intact.  HEENT: Head: normal shape and size; Eyes: sclera white, no icterus, conjunctiva pink, PERRLA and EOMs intact; Ears: Tm's gray and intact,  normal light reflex; Throat/Mouth: Teeth present, mucosa pink and moist, no exudate, lesions or ulcerations noted.  Neck:  Neck supple, trachea midline. No masses, lumps or thyromegaly present.  Cardiovascular: Normal rate and rhythm. S1,S2 noted.  No murmur, rubs or gallops noted. No JVD or BLE edema. No carotid bruits noted. Pulmonary/Chest: Normal effort and positive vesicular breath sounds. No respiratory distress. No wheezes, rales or ronchi noted.  Abdomen: Soft and nontender. Normal bowel sounds. No distention or masses noted. Liver, spleen and kidneys non palpable. Musculoskeletal: Strength 5/5 BUE/BLE. No difficulty with gait.  Neurological:  Alert and oriented. Cranial nerves II-XII grossly intact. Coordination normal.  Psychiatric: Mood and affect normal. Behavior is normal. Judgment and thought content normal.     BMET    Component Value Date/Time   NA 143 12/05/2016 0826   K 3.8 12/05/2016 0826   CL 105 12/05/2016 0826   CO2 29 12/05/2016 0826   GLUCOSE 85 12/05/2016 0826   BUN 15 12/05/2016 0826   CREATININE 1.07 12/05/2016 0826   CALCIUM 8.9 12/05/2016 0826   GFRNONAA >60 01/10/2010 0915   GFRAA  01/10/2010 0915    >60        The eGFR has been calculated using the MDRD equation. This calculation has not been validated in all clinical situations. eGFR's persistently <60 mL/min signify possible Chronic Kidney Disease.    Lipid Panel     Component Value Date/Time   CHOL 229 (H) 12/05/2016 0826   TRIG 107.0 12/05/2016 0826   HDL 71.10 12/05/2016 0826   CHOLHDL 3 12/05/2016 0826   VLDL 21.4 12/05/2016 0826   LDLCALC 136 (H) 12/05/2016 0826    CBC    Component Value Date/Time   WBC 6.9 08/29/2016 0839   RBC 5.29 (H) 08/29/2016 0839   HGB 14.8 08/29/2016 0839   HCT 45.2 08/29/2016 0839   PLT 289.0 08/29/2016 0839   MCV 85.3 08/29/2016 0839   MCH 28.1 01/18/2010 0530   MCHC 32.9 08/29/2016 0839   RDW 15.1 08/29/2016 0839    Hgb A1C Lab Results    Component Value Date   HGBA1C 6.0 08/29/2016            Assessment & Plan:   Preventative Health Maintenance:  Flu and tetanus UTD She declines Zostavax or Shingrix She declines mammogram, pelvic exam, bone density or colon cancer screening Encouraged her to consume a balanced diet and exercise regimen Advised her to see an eye doctor and dentist annually Will check CBC, CMET, Lipid, TSH, Free T4, A1C and Vit D today  RTC in 1 year, sooner if needed Webb Silversmith, NP

## 2018-01-08 NOTE — Assessment & Plan Note (Signed)
Borderline, question medication compliance Continue Benazepril and HCTZ Reinforced DASH diet and exercise for weight loss CBC and CMET today

## 2018-01-11 ENCOUNTER — Encounter: Payer: Self-pay | Admitting: Internal Medicine

## 2018-01-14 DIAGNOSIS — M84375A Stress fracture, left foot, initial encounter for fracture: Secondary | ICD-10-CM | POA: Diagnosis not present

## 2018-01-14 DIAGNOSIS — E559 Vitamin D deficiency, unspecified: Secondary | ICD-10-CM | POA: Diagnosis not present

## 2018-01-14 MED FILL — LEVOTHYROXINE 25 MCG TABLET: 25 | 90 days supply | Qty: 45 | Fill #2

## 2018-01-21 ENCOUNTER — Telehealth: Payer: Self-pay | Admitting: Internal Medicine

## 2018-01-21 DIAGNOSIS — M84375D Stress fracture, left foot, subsequent encounter for fracture with routine healing: Secondary | ICD-10-CM | POA: Diagnosis not present

## 2018-01-21 MED ORDER — SIMVASTATIN 20 MG PO TABS: 20.0000 mg | ORAL_TABLET | Freq: Every day | ORAL | 0 refills | Status: DC

## 2018-01-21 MED ORDER — BENAZEPRIL HCL 20 MG PO TABS: 20.0000 mg | ORAL_TABLET | Freq: Every day | ORAL | 1 refills | Status: DC

## 2018-01-21 MED ORDER — VITAMIN D (ERGOCALCIFEROL) 1.25 MG (50000 UNIT) PO CAPS: 50000.0000 [IU] | ORAL_CAPSULE | ORAL | 0 refills | Status: DC

## 2018-01-21 MED ORDER — LEVOTHYROXINE SODIUM 50 MCG PO TABS: 50.0000 ug | ORAL_TABLET | Freq: Every day | ORAL | 0 refills | Status: DC

## 2018-01-21 MED ORDER — LEVOTHYROXINE SODIUM 100 MCG PO TABS: 100.0000 ug | ORAL_TABLET | Freq: Every day | ORAL | 0 refills | Status: DC

## 2018-01-21 MED ORDER — SIMVASTATIN 40 MG PO TABS: 40.0000 mg | ORAL_TABLET | Freq: Every day | ORAL | 0 refills | Status: DC

## 2018-01-21 MED ORDER — HYDROCHLOROTHIAZIDE 25 MG PO TABS: 25.0000 mg | ORAL_TABLET | Freq: Every day | ORAL | 1 refills | Status: DC

## 2018-01-21 MED FILL — VIT D2 1.25 MG (50,000 UNIT: 1.25 MG | 84 days supply | Qty: 12 | Fill #0

## 2018-01-21 MED FILL — SIMVASTATIN 40 MG TABLET: 40 | 90 days supply | Qty: 90 | Fill #0

## 2018-01-21 MED FILL — HYDROCHLOROTHIAZIDE 25 MG T: 25 | 90 days supply | Qty: 90 | Fill #0

## 2018-01-21 MED FILL — BENAZEPRIL HCL 20 MG TABLET: 20 | 90 days supply | Qty: 90 | Fill #0

## 2018-01-21 NOTE — Telephone Encounter (Signed)
Need clarification dosage on Levothyroxine  2 prescriptions was sent over one for 50 mcg and 100 mcg. Need to know which one pt need to be one. If today dosage is 150 mc g they do have a pill for 150 mcg  And it would be cheaper for pt to just have one prescription for 150 mcg. Please advise.

## 2018-01-22 MED ORDER — LEVOTHYROXINE SODIUM 150 MCG PO TABS: 150.0000 ug | ORAL_TABLET | Freq: Every day | ORAL | 0 refills | Status: DC

## 2018-01-22 MED FILL — LEVOTHYROXINE 150 MCG TAB: 150 | 30 days supply | Qty: 30 | Fill #0

## 2018-01-22 NOTE — Telephone Encounter (Signed)
Should be taking 50 mcg

## 2018-01-22 NOTE — Telephone Encounter (Signed)
Pt has been taking 133mcg and 72mcg, Rx changed to 11mcg and has been resent to the pharmacy

## 2018-01-22 NOTE — Addendum Note (Signed)
Addended by: Lurlean Nanny on: 01/22/2018 10:27 AM   Modules accepted: Orders

## 2018-02-07 DIAGNOSIS — M84375D Stress fracture, left foot, subsequent encounter for fracture with routine healing: Secondary | ICD-10-CM | POA: Diagnosis not present

## 2018-02-19 DIAGNOSIS — M1712 Unilateral primary osteoarthritis, left knee: Secondary | ICD-10-CM | POA: Diagnosis not present

## 2018-03-11 ENCOUNTER — Other Ambulatory Visit: Payer: Self-pay | Admitting: Internal Medicine

## 2018-03-11 DIAGNOSIS — E7800 Pure hypercholesterolemia, unspecified: Secondary | ICD-10-CM

## 2018-03-11 DIAGNOSIS — E559 Vitamin D deficiency, unspecified: Secondary | ICD-10-CM

## 2018-03-11 DIAGNOSIS — E890 Postprocedural hypothyroidism: Secondary | ICD-10-CM

## 2018-03-12 MED FILL — LEVOTHYROXINE 100 MCG TABLE: 100 | 30 days supply | Qty: 30 | Fill #0

## 2018-04-22 ENCOUNTER — Other Ambulatory Visit: Payer: Self-pay | Admitting: Internal Medicine

## 2018-04-22 MED ORDER — LEVOTHYROXINE SODIUM 150 MCG PO TABS: 150.0000 ug | ORAL_TABLET | Freq: Every day | ORAL | 0 refills | Status: DC

## 2018-04-22 MED FILL — LEVOTHYROXINE 150 MCG TAB: 150 | 30 days supply | Qty: 30 | Fill #0

## 2018-04-22 MED FILL — SIMVASTATIN 20 MG TABLET: 20 | 90 days supply | Qty: 90 | Fill #0

## 2018-05-02 ENCOUNTER — Ambulatory Visit: Payer: 59 | Admitting: Internal Medicine

## 2018-05-02 VITALS — BP 142/96 | HR 73 | Temp 98.3°F | Wt 283.0 lb

## 2018-05-02 DIAGNOSIS — E039 Hypothyroidism, unspecified: Secondary | ICD-10-CM

## 2018-05-02 DIAGNOSIS — R0602 Shortness of breath: Secondary | ICD-10-CM

## 2018-05-02 DIAGNOSIS — E559 Vitamin D deficiency, unspecified: Secondary | ICD-10-CM | POA: Diagnosis not present

## 2018-05-02 DIAGNOSIS — E782 Mixed hyperlipidemia: Secondary | ICD-10-CM

## 2018-05-02 DIAGNOSIS — I10 Essential (primary) hypertension: Secondary | ICD-10-CM | POA: Diagnosis not present

## 2018-05-02 LAB — LIPID PANEL
Cholesterol: 244 mg/dL — ABNORMAL HIGH (ref 0–200)
HDL: 60.8 mg/dL (ref >39.00)
LDL CALC: 163 mg/dL — AB (ref 0–99)
NonHDL: 183.62
Total CHOL/HDL Ratio: 4
Triglycerides: 103 mg/dL (ref 0.0–149.0)
VLDL: 20.6 mg/dL (ref 0.0–40.0)

## 2018-05-02 LAB — COMPREHENSIVE METABOLIC PANEL
ALT: 21 U/L (ref 0–35)
AST: 20 U/L (ref 0–37)
Albumin: 3.8 g/dL (ref 3.5–5.2)
Alkaline Phosphatase: 88 U/L (ref 39–117)
BUN: 11 mg/dL (ref 6–23)
CO2: 32 mEq/L (ref 19–32)
CREATININE: 0.93 mg/dL (ref 0.40–1.20)
Calcium: 8.4 mg/dL (ref 8.4–10.5)
Chloride: 103 mEq/L (ref 96–112)
GFR: 73.61 mL/min (ref >60.00)
Glucose, Bld: 89 mg/dL (ref 70–99)
Potassium: 4 mEq/L (ref 3.5–5.1)
SODIUM: 142 meq/L (ref 135–145)
Total Bilirubin: 0.4 mg/dL (ref 0.2–1.2)
Total Protein: 7.2 g/dL (ref 6.0–8.3)

## 2018-05-02 LAB — TSH: TSH: 12.57 u[IU]/mL — ABNORMAL HIGH (ref 0.35–4.50)

## 2018-05-02 LAB — T4, FREE: Free T4: 0.83 ng/dL (ref 0.60–1.60)

## 2018-05-02 LAB — VITAMIN D 25 HYDROXY (VIT D DEFICIENCY, FRACTURES): VITD: 26.29 ng/mL — ABNORMAL LOW (ref 30.00–100.00)

## 2018-05-04 ENCOUNTER — Encounter: Payer: Self-pay | Admitting: Internal Medicine

## 2018-05-04 NOTE — Patient Instructions (Signed)
Fat and Cholesterol Restricted Eating Plan Getting too much fat and cholesterol in your diet may cause health problems. Choosing the right foods helps keep your fat and cholesterol at normal levels. This can keep you from getting certain diseases. Your doctor may recommend an eating plan that includes:  Total fat: ______% or less of total calories a day.  Saturated fat: ______% or less of total calories a day.  Cholesterol: less than _________mg a day.  Fiber: ______g a day. What are tips for following this plan? Meal planning  At meals, divide your plate into four equal parts: ? Fill one-half of your plate with vegetables and green salads. ? Fill one-fourth of your plate with whole grains. ? Fill one-fourth of your plate with low-fat (lean) protein foods.  Eat fish that is high in omega-3 fats at least two times a week. This includes mackerel, tuna, sardines, and salmon.  Eat foods that are high in fiber, such as whole grains, beans, apples, broccoli, carrots, peas, and barley. General tips   Work with your doctor to lose weight if you need to.  Avoid: ? Foods with added sugar. ? Fried foods. ? Foods with partially hydrogenated oils.  Limit alcohol intake to no more than 1 drink a day for nonpregnant women and 2 drinks a day for men. One drink equals 12 oz of beer, 5 oz of wine, or 1 oz of hard liquor. Reading food labels  Check food labels for: ? Trans fats. ? Partially hydrogenated oils. ? Saturated fat (g) in each serving. ? Cholesterol (mg) in each serving. ? Fiber (g) in each serving.  Choose foods with healthy fats, such as: ? Monounsaturated fats. ? Polyunsaturated fats. ? Omega-3 fats.  Choose grain products that have whole grains. Look for the word "whole" as the first word in the ingredient list. Cooking  Cook foods using low-fat methods. These include baking, boiling, grilling, and broiling.  Eat more home-cooked foods. Eat at restaurants and buffets  less often.  Avoid cooking using saturated fats, such as butter, cream, palm oil, palm kernel oil, and coconut oil. Recommended foods  Fruits  All fresh, canned (in natural juice), or frozen fruits. Vegetables  Fresh or frozen vegetables (raw, steamed, roasted, or grilled). Green salads. Grains  Whole grains, such as whole wheat or whole grain breads, crackers, cereals, and pasta. Unsweetened oatmeal, bulgur, barley, quinoa, or brown rice. Corn or whole wheat flour tortillas. Meats and other protein foods  Ground beef (85% or leaner), grass-fed beef, or beef trimmed of fat. Skinless chicken or turkey. Ground chicken or turkey. Pork trimmed of fat. All fish and seafood. Egg whites. Dried beans, peas, or lentils. Unsalted nuts or seeds. Unsalted canned beans. Nut butters without added sugar or oil. Dairy  Low-fat or nonfat dairy products, such as skim or 1% milk, 2% or reduced-fat cheeses, low-fat and fat-free ricotta or cottage cheese, or plain low-fat and nonfat yogurt. Fats and oils  Tub margarine without trans fats. Light or reduced-fat mayonnaise and salad dressings. Avocado. Olive, canola, sesame, or safflower oils. The items listed above may not be a complete list of foods and beverages you can eat. Contact a dietitian for more information. Foods to avoid Fruits  Canned fruit in heavy syrup. Fruit in cream or butter sauce. Fried fruit. Vegetables  Vegetables cooked in cheese, cream, or butter sauce. Fried vegetables. Grains  White bread. White pasta. White rice. Cornbread. Bagels, pastries, and croissants. Crackers and snack foods that contain trans fat   and hydrogenated oils. Meats and other protein foods  Fatty cuts of meat. Ribs, chicken wings, bacon, sausage, bologna, salami, chitterlings, fatback, hot dogs, bratwurst, and packaged lunch meats. Liver and organ meats. Whole eggs and egg yolks. Chicken and turkey with skin. Fried meat. Dairy  Whole or 2% milk, cream,  half-and-half, and cream cheese. Whole milk cheeses. Whole-fat or sweetened yogurt. Full-fat cheeses. Nondairy creamers and whipped toppings. Processed cheese, cheese spreads, and cheese curds. Beverages  Alcohol. Sugar-sweetened drinks such as sodas, lemonade, and fruit drinks. Fats and oils  Butter, stick margarine, lard, shortening, ghee, or bacon fat. Coconut, palm kernel, and palm oils. Sweets and desserts  Corn syrup, sugars, honey, and molasses. Candy. Jam and jelly. Syrup. Sweetened cereals. Cookies, pies, cakes, donuts, muffins, and ice cream. The items listed above may not be a complete list of foods and beverages you should avoid. Contact a dietitian for more information. Summary  Choosing the right foods helps keep your fat and cholesterol at normal levels. This can keep you from getting certain diseases.  At meals, fill one-half of your plate with vegetables and green salads.  Eat high-fiber foods, like whole grains, beans, apples, carrots, peas, and barley.  Limit added sugar, saturated fats, alcohol, and fried foods. This information is not intended to replace advice given to you by your health care provider. Make sure you discuss any questions you have with your health care provider. Document Released: 09/05/2011 Document Revised: 11/07/2017 Document Reviewed: 11/21/2016 Elsevier Interactive Patient Education  2019 [employer] Inc[employer]   

## 2018-05-04 NOTE — Progress Notes (Signed)
Subjective:    Patient ID: Judith Ross, female    DOB: [birth year], 63 y.o.   MRN: [id number]  HPI  Patient presents to the clinic today for follow-up.  Hypothyroidism: Her last TSH was elevated.  Her levothyroxine was increased to 150 mics daily but she reports she did not feel right on this, so she went back down to the 100 mics daily.  She is due for repeat TSH and free T4 today.  HLD: Her simvastatin was increased to 60 mg daily at her last visit.  She reports she has been taking the medication as prescribed.  She denies myalgias.  She could be better about consuming a low-fat diet.  HTN: Her BP today is 142/96.  She is taking benazepril, HCTZ as prescribed.  ECG from 09/2014 reviewed.  Vitamin D Deficiency: She is completed 12 weeks of ergocalciferol.  She is due to have her vitamin D level repeated today.  She also reports shortness of breath.  She reports this started 2 months ago.  This shortness of breath occurs with exertion and improves with rest.  She denies chest pain, chest tightness, nausea, vomiting or diaphoresis.  She has noticed increased weight gain and is not sure if this is related.  She denies chronic cough or swelling in the lower extremities.  There is no echocardiogram on file.  Review of Systems      Past Medical History:  Diagnosis Date  . Arthritis   . Chicken pox   . Hyperlipidemia   . Hypertension   . Obesity   . Sleep apnea   . Thyroid disease     Current Outpatient Medications  Medication Sig Dispense Refill  . benazepril (LOTENSIN) 20 MG tablet Take 1 tablet (20 mg total) by mouth daily. 90 tablet 1  . clobetasol ointment (TEMOVATE) 0.05 % Apply to affected area every night for 4 weeks, then every other day for 4 weeks and then twice a week for 4 weeks or until resolution. 30 g 5  . hydrochlorothiazide (HYDRODIURIL) 25 MG tablet Take 1 tablet (25 mg total) by mouth daily. 90 tablet 1  . levothyroxine (SYNTHROID, LEVOTHROID) 100 MCG tablet  TAKE 1 TABLET BY MOUTH DAILY 30 tablet 0  . simvastatin (ZOCOR) 20 MG tablet TAKE 1 TABLET BY MOUTH DAILY 90 tablet 0  . simvastatin (ZOCOR) 40 MG tablet Take 1 tablet (40 mg total) by mouth daily. 90 tablet 0  . levothyroxine (SYNTHROID, LEVOTHROID) 150 MCG tablet Take 1 tablet (150 mcg total) by mouth daily. (Patient not taking: Reported on 05/02/2018) 30 tablet 0   No current facility-administered medications for this visit.     Allergies  Allergen Reactions  . Erythromycin Itching    Family History  Problem Relation Age of Onset  . Stroke Father   . Diabetes Maternal Grandmother   . Hypertension Maternal Grandmother   . Diabetes Maternal Uncle   . Cancer Maternal Aunt        Breast    Social History   Socioeconomic History  . Marital status: Divorced    Spouse name: Not on file  . Number of children: Not on file  . Years of education: Not on file  . Highest education level: Not on file  Occupational History  . Not on file  Social Needs  . Financial resource strain: Not on file  . Food insecurity:    Worry: Not on file    Inability: Not on file  . Transportation needs:  Medical: Not on file    Non-medical: Not on file  Tobacco Use  . Smoking status: Never Smoker  . Smokeless tobacco: Never Used  Substance and Sexual Activity  . Alcohol use: No    Alcohol/week: 0.0 standard drinks  . Drug use: No  . Sexual activity: Yes  Lifestyle  . Physical activity:    Days per week: Not on file    Minutes per session: Not on file  . Stress: Not on file  Relationships  . Social connections:    Talks on phone: Not on file    Gets together: Not on file    Attends religious service: Not on file    Active member of club or organization: Not on file    Attends meetings of clubs or organizations: Not on file    Relationship status: Not on file  . Intimate partner violence:    Fear of current or ex partner: Not on file    Emotionally abused: Not on file    Physically  abused: Not on file    Forced sexual activity: Not on file  Other Topics Concern  . Not on file  Social History Narrative  . Not on file     Constitutional: Patient reports weight gain. Denies fever, malaise, fatigue, headache.  HEENT: Denies eye pain, eye redness, ear pain, ringing in the ears, wax buildup, runny nose, nasal congestion, bloody nose, or sore throat. Respiratory: Patient reports shortness of breath with exertion.  Denies difficulty breathing, cough or sputum production.   Cardiovascular: Denies chest pain, chest tightness, palpitations or swelling in the hands or feet.  Musculoskeletal: Denies decrease in range of motion, difficulty with gait, muscle pain or joint pain and swelling.  Neurological: Denies dizziness, difficulty with memory, difficulty with speech or problems with balance and coordination.    No other specific complaints in a complete review of systems (except as listed in HPI above).  Objective:   Physical Exam   BP (!) 142/96   Pulse 73   Temp 98.3 F (36.8 C) (Oral)   Wt 283 lb (128.4 kg)   SpO2 98%   BMI 46.38 kg/m  Wt Readings from Last 3 Encounters:  05/02/18 283 lb (128.4 kg)  01/08/18 276 lb (125.2 kg)  03/29/17 274 lb (124.3 kg)    General: Appears her stated age, obese, in NAD. HEENT: Head: normal shape and size; Throat/Mouth: Teeth present, mucosa pink and moist, no exudate, lesions or ulcerations noted.  Neck: Thyromegaly noted. Cardiovascular: Normal rate and rhythm. S1,S2 noted.  No murmur, rubs or gallops noted. No JVD or BLE edema. No carotid bruits noted. Pulmonary/Chest: Normal effort and positive vesicular breath sounds. No respiratory distress. No wheezes, rales or ronchi noted.  Neurological: Alert and oriented.    BMET    Component Value Date/Time   NA 142 05/02/2018 0831   K 4.0 05/02/2018 0831   CL 103 05/02/2018 0831   CO2 32 05/02/2018 0831   GLUCOSE 89 05/02/2018 0831   BUN 11 05/02/2018 0831   CREATININE  0.93 05/02/2018 0831   CALCIUM 8.4 05/02/2018 0831   GFRNONAA >60 01/10/2010 0915   GFRAA  01/10/2010 0915    >60        The eGFR has been calculated using the MDRD equation. This calculation has not been validated in all clinical situations. eGFR's persistently <60 mL/min signify possible Chronic Kidney Disease.    Lipid Panel     Component Value Date/Time   CHOL 244 (  H) 05/02/2018 0831   TRIG 103.0 05/02/2018 0831   HDL 60.80 05/02/2018 0831   CHOLHDL 4 05/02/2018 0831   VLDL 20.6 05/02/2018 0831   LDLCALC 163 (H) 05/02/2018 0831    CBC    Component Value Date/Time   WBC 5.2 01/08/2018 0937   RBC 5.38 (H) 01/08/2018 0937   HGB 15.0 01/08/2018 0937   HCT 45.3 01/08/2018 0937   PLT 304.0 01/08/2018 0937   MCV 84.3 01/08/2018 0937   MCH 28.1 01/18/2010 0530   MCHC 33.2 01/08/2018 0937   RDW 14.2 01/08/2018 0937    Hgb A1C Lab Results  Component Value Date   HGBA1C 5.9 01/08/2018           Assessment & Plan:   Shortness of Breath:  Likely deconditioning Will obtain chest x-ray today  Vit D Deficiency:  Repeat vitamin D today  Return precautions discussed Webb Silversmith, NP

## 2018-05-04 NOTE — Assessment & Plan Note (Signed)
Uncontrolled She is not interested in adding additional medication at this time Continue Benazepril and HCTZ Reinforced DASH diet and exercise for weight loss  RTC in 3 months for follow-up HTN

## 2018-05-04 NOTE — Assessment & Plan Note (Signed)
C met and lipid profile today Encouraged her to consume a low saturated fat diet and exercise for weight loss Continue Simvastatin for now, will adjust if needed based on labs

## 2018-05-04 NOTE — Assessment & Plan Note (Signed)
TSH and free T4 today Will adjust Levothyroxine if needed based on labs 

## 2018-05-06 MED FILL — CLOBETASOL PROPIONATE 0.05: 0.05 | 28 days supply | Qty: 30 | Fill #2

## 2018-06-05 MED ORDER — LEVOTHYROXINE SODIUM 150 MCG PO TABS: 150.0000 ug | ORAL_TABLET | Freq: Every day | ORAL | 0 refills | Status: DC

## 2018-06-05 NOTE — Addendum Note (Signed)
Addended by: Lurlean Nanny on: 06/05/2018 11:31 AM   Modules accepted: Orders

## 2018-08-06 ENCOUNTER — Other Ambulatory Visit (INDEPENDENT_AMBULATORY_CARE_PROVIDER_SITE_OTHER): Payer: 59

## 2018-08-06 DIAGNOSIS — E782 Mixed hyperlipidemia: Secondary | ICD-10-CM | POA: Diagnosis not present

## 2018-08-06 DIAGNOSIS — I10 Essential (primary) hypertension: Secondary | ICD-10-CM | POA: Diagnosis not present

## 2018-08-06 DIAGNOSIS — E039 Hypothyroidism, unspecified: Secondary | ICD-10-CM

## 2018-08-06 LAB — COMPREHENSIVE METABOLIC PANEL
ALT: 19 U/L (ref 0–35)
AST: 17 U/L (ref 0–37)
Albumin: 3.9 g/dL (ref 3.5–5.2)
Alkaline Phosphatase: 81 U/L (ref 39–117)
BUN: 12 mg/dL (ref 6–23)
CO2: 35 mEq/L — ABNORMAL HIGH (ref 19–32)
Calcium: 8.8 mg/dL (ref 8.4–10.5)
Chloride: 101 mEq/L (ref 96–112)
Creatinine, Ser: 1.03 mg/dL (ref 0.40–1.20)
GFR: 65.37 mL/min (ref >60.00)
Glucose, Bld: 100 mg/dL — ABNORMAL HIGH (ref 70–99)
Potassium: 4.1 mEq/L (ref 3.5–5.1)
Sodium: 141 mEq/L (ref 135–145)
Total Bilirubin: 0.5 mg/dL (ref 0.2–1.2)
Total Protein: 7.4 g/dL (ref 6.0–8.3)

## 2018-08-06 LAB — LIPID PANEL
Cholesterol: 236 mg/dL — ABNORMAL HIGH (ref 0–200)
HDL: 58.3 mg/dL (ref >39.00)
LDL Cholesterol: 155 mg/dL — ABNORMAL HIGH (ref 0–99)
NonHDL: 177.59
Total CHOL/HDL Ratio: 4
Triglycerides: 111 mg/dL (ref 0.0–149.0)
VLDL: 22.2 mg/dL (ref 0.0–40.0)

## 2018-08-06 LAB — TSH: TSH: 7.31 u[IU]/mL — ABNORMAL HIGH (ref 0.35–4.50)

## 2018-08-13 ENCOUNTER — Telehealth: Payer: Self-pay | Admitting: *Deleted

## 2018-08-13 NOTE — Telephone Encounter (Signed)
Patient left a voicemail stating that she wants to know if she needs to still be on her Vitamin D? Patient stated that if she needs to continue it she will need a script sent to [facility] outpatient pharmacy.

## 2018-08-14 NOTE — Telephone Encounter (Signed)
Per lab result note from 2/13, she should be taking 5000 units of Vit D 3 along with a Calcium supplement OTC. She was also supposed to come back in May to repeat CMET and Lipid.

## 2018-08-28 MED FILL — HYDROCHLOROTHIAZIDE 25 MG T: 25 | 90 days supply | Qty: 90 | Fill #1

## 2018-09-25 ENCOUNTER — Other Ambulatory Visit: Payer: Self-pay | Admitting: *Deleted

## 2018-09-25 MED ORDER — LEVOTHYROXINE SODIUM 175 MCG PO TABS: 175.0000 ug | ORAL_TABLET | Freq: Every day | ORAL | 0 refills | Status: DC

## 2018-09-25 MED FILL — LEVOTHYROXINE 175 MCG TABLE: 175 | 90 days supply | Qty: 30 | Fill #0

## 2018-09-25 NOTE — Telephone Encounter (Signed)
Patient left a voicemail stating that she needs a refill sent to the pharmacy for her thyroid medication. See letter mailed to patient on 08/29/18 with results and to schedule an appointment. Patient stated that the dose has recently been changed. Pharmacy [facility]

## 2018-09-26 MED FILL — BENAZEPRIL HCL 20 MG TABLET: 20 | 90 days supply | Qty: 90 | Fill #1

## 2018-10-22 DIAGNOSIS — M1712 Unilateral primary osteoarthritis, left knee: Secondary | ICD-10-CM | POA: Diagnosis not present

## 2018-10-24 ENCOUNTER — Ambulatory Visit: Payer: 59 | Admitting: Internal Medicine

## 2018-10-24 NOTE — Progress Notes (Deleted)
   Subjective:    Patient ID: Judith Ross, female    DOB: [birth year], 63 y.o.   MRN: [id number]  HPI  Pt presents to the clinic today to follow up chronic conditions.  Review of Systems     Objective:   Physical Exam        Assessment & Plan:

## 2018-10-28 ENCOUNTER — Other Ambulatory Visit: Payer: Self-pay | Admitting: Internal Medicine

## 2018-10-28 ENCOUNTER — Other Ambulatory Visit: Payer: Self-pay | Admitting: Obstetrics and Gynecology

## 2018-10-28 ENCOUNTER — Ambulatory Visit: Payer: 59 | Admitting: Internal Medicine

## 2018-11-01 MED FILL — LEVOTHYROXINE 175 MCG TABLE: 175 | 60 days supply | Qty: 60 | Fill #0

## 2018-11-12 MED FILL — AMOXICILLIN 500 MG CAPSULE: 500 | 10 days supply | Qty: 30 | Fill #0

## 2018-12-10 DIAGNOSIS — E559 Vitamin D deficiency, unspecified: Secondary | ICD-10-CM | POA: Diagnosis not present

## 2018-12-10 DIAGNOSIS — T148XXA Other injury of unspecified body region, initial encounter: Secondary | ICD-10-CM | POA: Diagnosis not present

## 2018-12-17 DIAGNOSIS — M84375A Stress fracture, left foot, initial encounter for fracture: Secondary | ICD-10-CM | POA: Diagnosis not present

## 2018-12-18 ENCOUNTER — Telehealth: Payer: Self-pay | Admitting: *Deleted

## 2018-12-18 NOTE — Telephone Encounter (Signed)
Has she had a recent Vit D level at the Podiatrist? If so, I would like to get a copy.

## 2018-12-18 NOTE — Telephone Encounter (Signed)
Patient called stating that she saw Dr. Gershon Mussel her foot doctor and he told her that she needs to be prescribed a high dose of vitamin D by her provider. Advised patient that a letter was mailed to her several months ago with her lab results and what Webb Silversmith NP recommended.  Patient stated that she does not remember getting the letter, but she is bad about not reading her mail. Patient wants to know if Webb Silversmith NP will prescribe it to her? Patient stated if you don't get her because she may be asleep to leave a message. Pharmacy [facility]

## 2018-12-24 ENCOUNTER — Ambulatory Visit: Payer: 59 | Admitting: Internal Medicine

## 2018-12-31 DIAGNOSIS — M84375D Stress fracture, left foot, subsequent encounter for fracture with routine healing: Secondary | ICD-10-CM | POA: Diagnosis not present

## 2019-01-06 NOTE — Telephone Encounter (Signed)
req records from Dr Gershon Mussel

## 2019-01-09 DIAGNOSIS — M84375D Stress fracture, left foot, subsequent encounter for fracture with routine healing: Secondary | ICD-10-CM | POA: Diagnosis not present

## 2019-01-09 MED FILL — MELOXICAM 7.5 MG TABLET: 7.5 | 30 days supply | Qty: 30 | Fill #0

## 2019-01-13 ENCOUNTER — Encounter: Payer: Self-pay | Admitting: Internal Medicine

## 2019-01-13 ENCOUNTER — Other Ambulatory Visit: Payer: Self-pay | Admitting: Internal Medicine

## 2019-01-13 ENCOUNTER — Ambulatory Visit (INDEPENDENT_AMBULATORY_CARE_PROVIDER_SITE_OTHER): Payer: 59 | Admitting: Internal Medicine

## 2019-01-13 ENCOUNTER — Other Ambulatory Visit: Payer: Self-pay

## 2019-01-13 VITALS — BP 136/82 | HR 65 | Temp 98.1°F | Ht 65.5 in | Wt 273.0 lb

## 2019-01-13 DIAGNOSIS — Z780 Asymptomatic menopausal state: Secondary | ICD-10-CM

## 2019-01-13 DIAGNOSIS — E7800 Pure hypercholesterolemia, unspecified: Secondary | ICD-10-CM | POA: Diagnosis not present

## 2019-01-13 DIAGNOSIS — M1990 Unspecified osteoarthritis, unspecified site: Secondary | ICD-10-CM

## 2019-01-13 DIAGNOSIS — I10 Essential (primary) hypertension: Secondary | ICD-10-CM

## 2019-01-13 DIAGNOSIS — E890 Postprocedural hypothyroidism: Secondary | ICD-10-CM | POA: Diagnosis not present

## 2019-01-13 DIAGNOSIS — G4733 Obstructive sleep apnea (adult) (pediatric): Secondary | ICD-10-CM

## 2019-01-13 DIAGNOSIS — R7303 Prediabetes: Secondary | ICD-10-CM | POA: Diagnosis not present

## 2019-01-13 DIAGNOSIS — Z0001 Encounter for general adult medical examination with abnormal findings: Secondary | ICD-10-CM | POA: Diagnosis not present

## 2019-01-13 LAB — HEMOGLOBIN A1C: Hgb A1c MFr Bld: 6 % (ref 4.6–6.5)

## 2019-01-13 LAB — COMPREHENSIVE METABOLIC PANEL
ALT: 19 U/L (ref 0–35)
AST: 16 U/L (ref 0–37)
Albumin: 3.9 g/dL (ref 3.5–5.2)
Alkaline Phosphatase: 99 U/L (ref 39–117)
BUN: 11 mg/dL (ref 6–23)
CO2: 33 mEq/L — ABNORMAL HIGH (ref 19–32)
Calcium: 8.8 mg/dL (ref 8.4–10.5)
Chloride: 99 mEq/L (ref 96–112)
Creatinine, Ser: 0.89 mg/dL (ref 0.40–1.20)
GFR: 77.26 mL/min (ref >60.00)
Glucose, Bld: 92 mg/dL (ref 70–99)
Potassium: 4.4 mEq/L (ref 3.5–5.1)
Sodium: 139 mEq/L (ref 135–145)
Total Bilirubin: 0.5 mg/dL (ref 0.2–1.2)
Total Protein: 6.8 g/dL (ref 6.0–8.3)

## 2019-01-13 LAB — LIPID PANEL
Cholesterol: 289 mg/dL — ABNORMAL HIGH (ref 0–200)
HDL: 55.9 mg/dL (ref >39.00)
LDL Cholesterol: 205 mg/dL — ABNORMAL HIGH (ref 0–99)
NonHDL: 233.55
Total CHOL/HDL Ratio: 5
Triglycerides: 143 mg/dL (ref 0.0–149.0)
VLDL: 28.6 mg/dL (ref 0.0–40.0)

## 2019-01-13 LAB — VITAMIN D 25 HYDROXY (VIT D DEFICIENCY, FRACTURES): VITD: 22.25 ng/mL — ABNORMAL LOW (ref 30.00–100.00)

## 2019-01-13 LAB — CBC
HCT: 44.3 % (ref 36.0–46.0)
Hemoglobin: 14.3 g/dL (ref 12.0–15.0)
MCHC: 32.3 g/dL (ref 30.0–36.0)
MCV: 84.4 fl (ref 78.0–100.0)
Platelets: 317 10*3/uL (ref 150.0–400.0)
RBC: 5.25 Mil/uL — ABNORMAL HIGH (ref 3.87–5.11)
RDW: 15.3 % (ref 11.5–15.5)
WBC: 6.1 10*3/uL (ref 4.0–10.5)

## 2019-01-13 LAB — TSH: TSH: 0.49 u[IU]/mL (ref 0.35–4.50)

## 2019-01-13 LAB — T4, FREE: Free T4: 1.26 ng/dL (ref 0.60–1.60)

## 2019-01-13 NOTE — Patient Instructions (Signed)

## 2019-01-13 NOTE — Assessment & Plan Note (Signed)
Discussed how weight loss could improve her joint pain She will continue Ibuprofen, BC Powders and Tylenol She will continue to follow with orthopedics CMET today

## 2019-01-13 NOTE — Assessment & Plan Note (Signed)
Not wearing CPAP Encouraged weight loss and discussed how this could help improve OSA She is not following with pulmonology

## 2019-01-13 NOTE — Progress Notes (Signed)
Subjective:    Patient ID: Judith Ross, female    DOB: 02-22-1956, 63 y.o.   MRN: [id number]  HPI  Pt presents to the clinic today for her annual exam. She is also due to follow up chronic conditions.  Arthritis: Mainly in her knees. She takes Ibuprofen, Tylenol, BC Powders as needed. She is following with orthopedics. She needs bilateral knee replacements. She reports she did not get much relief from injections.  HTN: Her BP today is 136/82. She is taking Benazepril HCTZ as prescribed. ECG from 09/2014 reviewed.  HLD: Her last LDL was 155, 07/2018. She denies myalgias on Simvastatin. She does not consume a low fat diet.  OSA: She averages 5 hours of sleep per night without the use of her CPAP. She does feel rested when she wakes up. She does not nap during the day. Sleep study from 11/2016 reviewed.  Prediabetes: Her last A1C was 5.9, 12/2017. She is not taking any oral diabetic medication at this time. She does not check her sugars.  Hypothyroidism: She denies any issues on her current dose of Levothyroxine.   Flu: 11/2018 Tetanus: 09/2014 Zostovax:  never Shingrix: never Mammogram: > 10 years ago Pap Smear: > 10 years ago, total hysterectomy Bone Density: never Colon Screening: never Vision Screening: annually Dentist: as needed  Diet: She does eat meat. She consumes more veggies than fruit. She does eat some fried foods. She drinks mostly gatorade, sweet tea, water. Exercise: None  Review of Systems      Past Medical History:  Diagnosis Date  . Arthritis   . Chicken pox   . Hyperlipidemia   . Hypertension   . Obesity   . Sleep apnea   . Thyroid disease     Current Outpatient Medications  Medication Sig Dispense Refill  . benazepril (LOTENSIN) 20 MG tablet Take 1 tablet (20 mg total) by mouth daily. 90 tablet 1  . clobetasol ointment (TEMOVATE) 0.05 % Apply to affected area every night for 4 weeks, then every other day for 4 weeks and then twice a week for 4  weeks or until resolution. 30 g 5  . hydrochlorothiazide (HYDRODIURIL) 25 MG tablet Take 1 tablet (25 mg total) by mouth daily. 90 tablet 1  . levothyroxine (SYNTHROID) 175 MCG tablet TAKE 1 TABLET BY MOUTH DAILY BEFORE BREAKFAST. 30 tablet 1  . simvastatin (ZOCOR) 20 MG tablet TAKE 1 TABLET BY MOUTH DAILY 90 tablet 0  . simvastatin (ZOCOR) 40 MG tablet Take 1 tablet (40 mg total) by mouth daily. 90 tablet 0   No current facility-administered medications for this visit.     Allergies  Allergen Reactions  . Erythromycin Itching    Family History  Problem Relation Age of Onset  . Stroke Father   . Diabetes Maternal Grandmother   . Hypertension Maternal Grandmother   . Diabetes Maternal Uncle   . Cancer Maternal Aunt        Breast    Social History   Socioeconomic History  . Marital status: Divorced    Spouse name: Not on file  . Number of children: Not on file  . Years of education: Not on file  . Highest education level: Not on file  Occupational History  . Not on file  Social Needs  . Financial resource strain: Not on file  . Food insecurity    Worry: Not on file    Inability: Not on file  . Transportation needs    Medical: Not on  file    Non-medical: Not on file  Tobacco Use  . Smoking status: Never Smoker  . Smokeless tobacco: Never Used  Substance and Sexual Activity  . Alcohol use: No    Alcohol/week: 0.0 standard drinks  . Drug use: No  . Sexual activity: Yes  Lifestyle  . Physical activity    Days per week: Not on file    Minutes per session: Not on file  . Stress: Not on file  Relationships  . Social Herbalist on phone: Not on file    Gets together: Not on file    Attends religious service: Not on file    Active member of club or organization: Not on file    Attends meetings of clubs or organizations: Not on file    Relationship status: Not on file  . Intimate partner violence    Fear of current or ex partner: Not on file     Emotionally abused: Not on file    Physically abused: Not on file    Forced sexual activity: Not on file  Other Topics Concern  . Not on file  Social History Narrative  . Not on file     Constitutional: Denies fever, malaise, fatigue, headache or abrupt weight changes.  HEENT: Denies eye pain, eye redness, ear pain, ringing in the ears, wax buildup, runny nose, nasal congestion, bloody nose, or sore throat. Respiratory: Denies difficulty breathing, shortness of breath, cough or sputum production.   Cardiovascular: Denies chest pain, chest tightness, palpitations or swelling in the hands or feet.  Gastrointestinal: Denies abdominal pain, bloating, constipation, diarrhea or blood in the stool.  GU: Denies urgency, frequency, pain with urination, burning sensation, blood in urine, odor or discharge. Musculoskeletal: Pt reports bilateral knee pain. Denies decrease in range of motion, difficulty with gait, muscle pain or joint swelling.  Skin: Denies redness, rashes, lesions or ulcercations.  Neurological: Denies dizziness, difficulty with memory, difficulty with speech or problems with balance and coordination.  Psych: Denies anxiety, depression, SI/HI.  No other specific complaints in a complete review of systems (except as listed in HPI above).  Objective:   Physical Exam   BP 136/82   Pulse 65   Temp 98.1 F (36.7 C) (Temporal)   Ht 5' 5.5" (1.664 m)   Wt 273 lb (123.8 kg)   SpO2 98%   BMI 44.74 kg/m  Wt Readings from Last 3 Encounters:  01/13/19 273 lb (123.8 kg)  05/02/18 283 lb (128.4 kg)  01/08/18 276 lb (125.2 kg)    General: Appears her stated age, obese, in NAD. Skin: Warm, dry and intact. No rashes, lesions or ulcerations noted. HEENT: Head: normal shape and size; Eyes: sclera white, no icterus, conjunctiva pink, PERRLA and EOMs intact; Ears: Tm's gray and intact, normal light reflex;  Neck:  Neck supple, trachea midline. No masses, lumps or thyromegaly present.   Cardiovascular: Normal rate and rhythm. S1,S2 noted.  No murmur, rubs or gallops noted. No JVD or BLE edema. No carotid bruits noted. Pulmonary/Chest: Normal effort and positive vesicular breath sounds. No respiratory distress. No wheezes, rales or ronchi noted.  Abdomen: Soft and nontender. Normal bowel sounds. No distention or masses noted. Liver, spleen and kidneys non palpable. Musculoskeletal: Strength 5/5 BUE/BLE. Left foot in boot. No difficulty with gait.  Neurological: Alert and oriented. Cranial nerves II-XII grossly intact. Coordination normal.  Psychiatric: Mood and affect normal. Behavior is normal. Judgment and thought content normal.     BMET  Component Value Date/Time   NA 141 08/06/2018 1405   K 4.1 08/06/2018 1405   CL 101 08/06/2018 1405   CO2 35 (H) 08/06/2018 1405   GLUCOSE 100 (H) 08/06/2018 1405   BUN 12 08/06/2018 1405   CREATININE 1.03 08/06/2018 1405   CALCIUM 8.8 08/06/2018 1405   GFRNONAA >60 01/10/2010 0915   GFRAA  01/10/2010 0915    >60        The eGFR has been calculated using the MDRD equation. This calculation has not been validated in all clinical situations. eGFR's persistently <60 mL/min signify possible Chronic Kidney Disease.    Lipid Panel     Component Value Date/Time   CHOL 236 (H) 08/06/2018 1405   TRIG 111.0 08/06/2018 1405   HDL 58.30 08/06/2018 1405   CHOLHDL 4 08/06/2018 1405   VLDL 22.2 08/06/2018 1405   LDLCALC 155 (H) 08/06/2018 1405    CBC    Component Value Date/Time   WBC 5.2 01/08/2018 0937   RBC 5.38 (H) 01/08/2018 0937   HGB 15.0 01/08/2018 0937   HCT 45.3 01/08/2018 0937   PLT 304.0 01/08/2018 0937   MCV 84.3 01/08/2018 0937   MCH 28.1 01/18/2010 0530   MCHC 33.2 01/08/2018 0937   RDW 14.2 01/08/2018 0937    Hgb A1C Lab Results  Component Value Date   HGBA1C 5.9 01/08/2018           Assessment & Plan:   Preventative Health Maintenance:  Flu shot UTD Tetanus UTD She declines mammogram  She no longer needs pap smears Bone density ordered- [facility] will [facility] to schedule She declines colonoscopy, IFOB or Cologuard at this time Encouraged her to consume a balanced diet and exercise regimen Advised her to see an eye doctor and dentist annually Will check CBC, CMET, Lipid, TSH, Free T4, A1C and Vit D today  RTC in 1 year, sooner if needed Webb Silversmith, NP

## 2019-01-13 NOTE — Assessment & Plan Note (Signed)
Continue Benazepril HCT Reinforced DASH diet and exercise for weight loss CMET Today

## 2019-01-13 NOTE — Assessment & Plan Note (Signed)
TSH and Free T4 today Will adjust Levothyroxine if needed based on labs 

## 2019-01-13 NOTE — Assessment & Plan Note (Signed)
CMET and Lipid profile today Encouraged her to consume a low fat diet Continue Simvastatin for now 

## 2019-01-13 NOTE — Assessment & Plan Note (Signed)
Repeat A1C today Encouraged low carb diet and exercise for weight loss Will monitor

## 2019-01-14 ENCOUNTER — Telehealth: Payer: Self-pay | Admitting: *Deleted

## 2019-01-14 NOTE — Telephone Encounter (Signed)
Judith Ross with [facility] received refill of simvastatin 20 mg and simvastatin 40mg . Pharmacy called to verify if that is correct that Webb Silversmith, NP wants pt on both doses or is she only suppose to be on one dose of simvastatin and if so which one.   Judith Ross request call back to let them know [phone]

## 2019-01-14 NOTE — Telephone Encounter (Signed)
She should be taking a total of 60 mg daily.

## 2019-01-15 MED FILL — SIMVASTATIN 20 MG TABLET: 20 | 90 days supply | Qty: 90 | Fill #0

## 2019-01-15 MED FILL — SIMVASTATIN 40 MG TABLET: 40 | 90 days supply | Qty: 90 | Fill #0

## 2019-01-17 MED ORDER — SIMVASTATIN 20 MG PO TABS: 20.0000 mg | ORAL_TABLET | Freq: Every day | ORAL | 2 refills | Status: DC

## 2019-01-17 MED ORDER — SIMVASTATIN 40 MG PO TABS: 40.0000 mg | ORAL_TABLET | Freq: Every day | ORAL | 2 refills | Status: DC

## 2019-01-17 NOTE — Addendum Note (Signed)
Addended by: Lurlean Nanny on: 01/17/2019 11:02 AM   Modules accepted: Orders

## 2019-01-21 ENCOUNTER — Other Ambulatory Visit: Payer: Self-pay | Admitting: Internal Medicine

## 2019-01-22 MED FILL — LEVOTHYROXINE 175 MCG TABLE: 175 | 90 days supply | Qty: 90 | Fill #0

## 2019-01-29 ENCOUNTER — Other Ambulatory Visit: Payer: Self-pay | Admitting: Internal Medicine

## 2019-02-05 MED FILL — HYDROCHLOROTHIAZIDE 25 MG T: 25 | 90 days supply | Qty: 90 | Fill #0

## 2019-02-05 NOTE — Telephone Encounter (Signed)
Pt left v/m that [facility] requested refill HCTZ on 01/29/19 and pt needs to pick up med today. Pt request cb.

## 2019-02-10 ENCOUNTER — Telehealth: Payer: Self-pay

## 2019-02-10 DIAGNOSIS — N904 Leukoplakia of vulva: Secondary | ICD-10-CM | POA: Diagnosis not present

## 2019-02-10 MED ORDER — VITAMIN D (ERGOCALCIFEROL) 1.25 MG (50000 UNIT) PO CAPS: 50000.0000 [IU] | ORAL_CAPSULE | ORAL | 0 refills | Status: DC

## 2019-02-10 MED FILL — CLOBETASOL PROPIONATE 0.05: 0.05 | 28 days supply | Qty: 30 | Fill #0

## 2019-02-10 MED FILL — VIT D2 1.25 MG (50,000 UNIT: 1.25 MG | 84 days supply | Qty: 12 | Fill #0

## 2019-02-10 NOTE — Telephone Encounter (Signed)
Patient advised of all. I called pharmacy to make sure they have HCTZ RX and they do have it ready.

## 2019-02-10 NOTE — Telephone Encounter (Signed)
Patient left message stating that she needs RX for HCTZ and Vitamin D RX. Left message for patient to call back. HCTZ was refilled on 02/05/2019 to [facility]. Reviewed recent lab notes and patient was to start Vit D RX but RX was not sent in. I sent that in now. Patient just needs to be advised.

## 2019-02-24 ENCOUNTER — Other Ambulatory Visit: Payer: Self-pay | Admitting: Internal Medicine

## 2019-02-27 ENCOUNTER — Other Ambulatory Visit: Payer: Self-pay

## 2019-02-27 MED ORDER — BENAZEPRIL HCL 20 MG PO TABS: 20.0000 mg | ORAL_TABLET | Freq: Every day | ORAL | 1 refills | Status: DC

## 2019-02-27 MED FILL — BENAZEPRIL HCL 20 MG TABLET: 20 | 90 days supply | Qty: 90 | Fill #0

## 2019-02-27 NOTE — Telephone Encounter (Signed)
[place] Night - Client Nonclinical Telephone Record [facility] Client [place] Night - Client Client Site [place] Physician Webb Silversmith - NP Contact Type Call Who Is Calling Patient / Member / Family / Caregiver Caller Name Latrecia Mathewson Caller Phone Number [phone] Patient Name Judith Ross Patient DOB 12-Mar-1956 Call Type Message Only Information Provided Reason for Call Medication Question / Request Initial Comment Caller states she needs her medication refilled. Caller states she is out of her medication. Additional Comment Secondary: [phone] Disp. Time Disposition Final User 02/27/2019 7:44:33 AM General Information Provided Yes Faythe Dingwall, Tyrechia Call Closed By: Lezlie Octave Transaction Date/Time: 02/27/2019 7:42:05 AM (ET)

## 2019-02-27 NOTE — Telephone Encounter (Signed)
Pt had annual visit 01/13/19.per office note pt should continue Benazepril HCT. On med list Benazepril 20 mg last filled # 90 x1 01/21/2018. HCTZ 25 mg filled # 90 x 2 on 02/05/19.Please advise.

## 2019-04-30 ENCOUNTER — Other Ambulatory Visit: Payer: 59

## 2019-05-13 ENCOUNTER — Other Ambulatory Visit: Payer: Self-pay | Admitting: Internal Medicine

## 2019-05-13 MED FILL — LEVOTHYROXINE 175 MCG TABLE: 175 | 90 days supply | Qty: 90 | Fill #1

## 2019-05-19 DIAGNOSIS — T148XXA Other injury of unspecified body region, initial encounter: Secondary | ICD-10-CM | POA: Diagnosis not present

## 2019-05-19 MED FILL — MELOXICAM 7.5 MG TABLET: 7.5 | 30 days supply | Qty: 30 | Fill #0

## 2019-05-20 ENCOUNTER — Other Ambulatory Visit (INDEPENDENT_AMBULATORY_CARE_PROVIDER_SITE_OTHER): Payer: 59

## 2019-05-20 ENCOUNTER — Ambulatory Visit: Payer: 59 | Admitting: Internal Medicine

## 2019-05-20 ENCOUNTER — Other Ambulatory Visit: Payer: Self-pay | Admitting: Internal Medicine

## 2019-05-20 ENCOUNTER — Other Ambulatory Visit: Payer: Self-pay

## 2019-05-20 DIAGNOSIS — E7800 Pure hypercholesterolemia, unspecified: Secondary | ICD-10-CM

## 2019-05-20 LAB — COMPREHENSIVE METABOLIC PANEL
ALT: 14 U/L (ref 0–35)
AST: 14 U/L (ref 0–37)
Albumin: 3.7 g/dL (ref 3.5–5.2)
Alkaline Phosphatase: 89 U/L (ref 39–117)
BUN: 13 mg/dL (ref 6–23)
CO2: 34 mEq/L — ABNORMAL HIGH (ref 19–32)
Calcium: 8.9 mg/dL (ref 8.4–10.5)
Chloride: 101 mEq/L (ref 96–112)
Creatinine, Ser: 1.04 mg/dL (ref 0.40–1.20)
GFR: 64.48 mL/min (ref >60.00)
Glucose, Bld: 77 mg/dL (ref 70–99)
Potassium: 3.9 mEq/L (ref 3.5–5.1)
Sodium: 141 mEq/L (ref 135–145)
Total Bilirubin: 0.3 mg/dL (ref 0.2–1.2)
Total Protein: 7.2 g/dL (ref 6.0–8.3)

## 2019-05-20 LAB — LIPID PANEL
Cholesterol: 221 mg/dL — ABNORMAL HIGH (ref 0–200)
HDL: 69.5 mg/dL (ref >39.00)
LDL Cholesterol: 136 mg/dL — ABNORMAL HIGH (ref 0–99)
NonHDL: 151.09
Total CHOL/HDL Ratio: 3
Triglycerides: 73 mg/dL (ref 0.0–149.0)
VLDL: 14.6 mg/dL (ref 0.0–40.0)

## 2019-06-26 ENCOUNTER — Ambulatory Visit: Payer: Self-pay | Attending: Family

## 2019-06-26 DIAGNOSIS — Z23 Encounter for immunization: Secondary | ICD-10-CM

## 2019-06-26 NOTE — Progress Notes (Signed)
   Covid-19 Vaccination Clinic  Name:  Judith Ross    MRN: [id number] DOB: [birth year]  06/26/2019  [name] was observed post Covid-19 immunization for 15 minutes without incident. She was provided with Vaccine Information Sheet and instruction to access the V-Safe system.   [name] was instructed to call 911 with any severe reactions post vaccine: Marland Kitchen Difficulty breathing  . Swelling of face and throat  . A fast heartbeat  . A bad rash all over body  . Dizziness and weakness   Immunizations Administered    Name Date Dose VIS Date Route   Moderna COVID-19 Vaccine 06/26/2019 11:21 AM 0.5 mL 02/18/2019 Intramuscular   Manufacturer: Moderna   Lot: [id number]   [place]:[id number]

## 2019-07-11 ENCOUNTER — Other Ambulatory Visit: Payer: 59

## 2019-07-29 ENCOUNTER — Ambulatory Visit: Payer: Self-pay | Attending: Family

## 2019-07-29 DIAGNOSIS — Z23 Encounter for immunization: Secondary | ICD-10-CM

## 2019-07-29 NOTE — Progress Notes (Signed)
   Covid-19 Vaccination Clinic  Name:  Judith Ross    MRN: [id number] DOB: 06-May-1955  07/29/2019  [name] was observed post Covid-19 immunization for 15 minutes without incident. She was provided with Vaccine Information Sheet and instruction to access the V-Safe system.   [name] was instructed to call 911 with any severe reactions post vaccine: Marland Kitchen Difficulty breathing  . Swelling of face and throat  . A fast heartbeat  . A bad rash all over body  . Dizziness and weakness   Immunizations Administered    Name Date Dose VIS Date Route   Moderna COVID-19 Vaccine 07/29/2019 10:49 AM 0.5 mL 02/2019 Intramuscular   Manufacturer: Moderna   Lot: [id number]   [place]:[id number]

## 2019-08-11 ENCOUNTER — Telehealth: Payer: Self-pay | Admitting: Internal Medicine

## 2019-08-11 NOTE — Telephone Encounter (Signed)
Patient called requesting a refill on HCTZ.  Patient said she no longer uses [facility].  Patient needs the rx sent to [facility] by [facility].

## 2019-08-13 MED ORDER — HYDROCHLOROTHIAZIDE 25 MG PO TABS: 25.0000 mg | ORAL_TABLET | Freq: Every day | ORAL | 1 refills | Status: DC

## 2019-08-13 NOTE — Telephone Encounter (Signed)
Rx sent through [facility] added

## 2019-12-30 ENCOUNTER — Other Ambulatory Visit: Payer: Self-pay | Admitting: Internal Medicine

## 2020-01-18 ENCOUNTER — Other Ambulatory Visit: Payer: Self-pay | Admitting: Internal Medicine

## 2020-02-08 ENCOUNTER — Other Ambulatory Visit: Payer: Self-pay | Admitting: Internal Medicine

## 2020-03-29 ENCOUNTER — Other Ambulatory Visit: Payer: Self-pay | Admitting: Internal Medicine

## 2020-04-05 ENCOUNTER — Ambulatory Visit: Payer: Self-pay | Admitting: Internal Medicine

## 2020-04-20 ENCOUNTER — Other Ambulatory Visit: Payer: Self-pay

## 2020-04-20 ENCOUNTER — Ambulatory Visit (INDEPENDENT_AMBULATORY_CARE_PROVIDER_SITE_OTHER): Payer: Medicare Other | Admitting: Internal Medicine

## 2020-04-20 ENCOUNTER — Encounter: Payer: Self-pay | Admitting: Internal Medicine

## 2020-04-20 VITALS — BP 136/86 | HR 76 | Temp 97.2°F | Wt 263.0 lb

## 2020-04-20 DIAGNOSIS — Z23 Encounter for immunization: Secondary | ICD-10-CM

## 2020-04-20 DIAGNOSIS — E7800 Pure hypercholesterolemia, unspecified: Secondary | ICD-10-CM

## 2020-04-20 DIAGNOSIS — M1990 Unspecified osteoarthritis, unspecified site: Secondary | ICD-10-CM | POA: Diagnosis not present

## 2020-04-20 DIAGNOSIS — I10 Essential (primary) hypertension: Secondary | ICD-10-CM | POA: Diagnosis not present

## 2020-04-20 DIAGNOSIS — G4733 Obstructive sleep apnea (adult) (pediatric): Secondary | ICD-10-CM

## 2020-04-20 DIAGNOSIS — R7303 Prediabetes: Secondary | ICD-10-CM | POA: Diagnosis not present

## 2020-04-20 DIAGNOSIS — E890 Postprocedural hypothyroidism: Secondary | ICD-10-CM

## 2020-04-20 LAB — CBC
HCT: 43.2 % (ref 36.0–46.0)
Hemoglobin: 13.9 g/dL (ref 12.0–15.0)
MCHC: 32.3 g/dL (ref 30.0–36.0)
MCV: 82.4 fl (ref 78.0–100.0)
Platelets: 387 10*3/uL (ref 150.0–400.0)
RBC: 5.24 Mil/uL — ABNORMAL HIGH (ref 3.87–5.11)
RDW: 14.7 % (ref 11.5–15.5)
WBC: 7.1 10*3/uL (ref 4.0–10.5)

## 2020-04-20 LAB — COMPREHENSIVE METABOLIC PANEL
ALT: 18 U/L (ref 0–35)
AST: 14 U/L (ref 0–37)
Albumin: 4 g/dL (ref 3.5–5.2)
Alkaline Phosphatase: 88 U/L (ref 39–117)
BUN: 14 mg/dL (ref 6–23)
CO2: 33 mEq/L — ABNORMAL HIGH (ref 19–32)
Calcium: 9.1 mg/dL (ref 8.4–10.5)
Chloride: 102 mEq/L (ref 96–112)
Creatinine, Ser: 0.99 mg/dL (ref 0.40–1.20)
GFR: 59.94 mL/min — ABNORMAL LOW (ref >60.00)
Glucose, Bld: 93 mg/dL (ref 70–99)
Potassium: 4.3 mEq/L (ref 3.5–5.1)
Sodium: 142 mEq/L (ref 135–145)
Total Bilirubin: 0.3 mg/dL (ref 0.2–1.2)
Total Protein: 7.1 g/dL (ref 6.0–8.3)

## 2020-04-20 LAB — LIPID PANEL
Cholesterol: 232 mg/dL — ABNORMAL HIGH (ref 0–200)
HDL: 51.5 mg/dL (ref >39.00)
LDL Cholesterol: 148 mg/dL — ABNORMAL HIGH (ref 0–99)
NonHDL: 180.88
Total CHOL/HDL Ratio: 5
Triglycerides: 163 mg/dL — ABNORMAL HIGH (ref 0.0–149.0)
VLDL: 32.6 mg/dL (ref 0.0–40.0)

## 2020-04-20 LAB — T4, FREE: Free T4: 1.32 ng/dL (ref 0.60–1.60)

## 2020-04-20 LAB — HEMOGLOBIN A1C: Hgb A1c MFr Bld: 6 % (ref 4.6–6.5)

## 2020-04-20 LAB — TSH: TSH: 0.14 u[IU]/mL — ABNORMAL LOW (ref 0.35–4.50)

## 2020-04-20 NOTE — Assessment & Plan Note (Signed)
TSH and Free T4 today Will adjust Levothyroxine if needed based on labs 

## 2020-04-20 NOTE — Assessment & Plan Note (Signed)
A1C today Encouraged low carb diet and exercise for weight loss 

## 2020-04-20 NOTE — Assessment & Plan Note (Signed)
Does not have a CPAP Encouraged weight loss Will monitor

## 2020-04-20 NOTE — Progress Notes (Signed)
Subjective:    Patient ID: Judith Ross, female    DOB: 10/06/1955, 65 y.o.   MRN: [id number]  HPI  Patient presents the clinic today for follow-up of chronic conditions.  OA: Mainly in her knees. No improvement with injections.  She takes Tylenol as needed with some relief of symptoms.  She does follow with orthopedics.  HTN: Her BP today is 136/86.  She is taking Benazepril HCT as prescribed.  ECG from 09/2014 reviewed.  HLD: Her last LDL was 136, 05/2019.  She denies myalgias on Simvastatin.  She does not consume a low-fat diet.  OSA: She averages 1-2 hours of sleep per night without the use of her CPAP.  Sleep study from 11/2016 reviewed.  Prediabetes: Her last A1c was 6%, 12/2018.  She is not taking any oral diabetic medication at this time.  She does not check her sugars.  Hypothyroidism she denies any issues on her current dose of Levothyroxine.  She does not follow with endocrinology.  Review of Systems      Past Medical History:  Diagnosis Date  . Arthritis   . Chicken pox   . Hyperlipidemia   . Hypertension   . Obesity   . Sleep apnea   . Thyroid disease     Current Outpatient Medications  Medication Sig Dispense Refill  . benazepril (LOTENSIN) 20 MG tablet TAKE 1 TABLET BY MOUTH DAILY 30 tablet 0  . clobetasol ointment (TEMOVATE) 0.05 % Apply to affected area every night for 4 weeks, then every other day for 4 weeks and then twice a week for 4 weeks or until resolution. 30 g 5  . hydrochlorothiazide (HYDRODIURIL) 25 MG tablet Take 1 tablet (25 mg total) by mouth daily. 90 tablet 1  . levothyroxine (SYNTHROID) 175 MCG tablet TAKE 1 TABLET(175 MCG) BY MOUTH DAILY BEFORE BREAKFAST 30 tablet 0  . simvastatin (ZOCOR) 20 MG tablet TAKE 1 TABLET BY MOUTH DAILY 90 tablet 1  . simvastatin (ZOCOR) 40 MG tablet TAKE 1 TABLET BY MOUTH DAILY 90 tablet 1   No current facility-administered medications for this visit.    Allergies  Allergen Reactions  . Erythromycin  Itching    Family History  Problem Relation Age of Onset  . Stroke Father   . Diabetes Maternal Grandmother   . Hypertension Maternal Grandmother   . Diabetes Maternal Uncle   . Cancer Maternal Aunt        Breast    Social History   Socioeconomic History  . Marital status: Divorced    Spouse name: Not on file  . Number of children: Not on file  . Years of education: Not on file  . Highest education level: Not on file  Occupational History  . Not on file  Tobacco Use  . Smoking status: Never Smoker  . Smokeless tobacco: Never Used  Substance and Sexual Activity  . Alcohol use: No    Alcohol/week: 0.0 standard drinks  . Drug use: No  . Sexual activity: Yes  Other Topics Concern  . Not on file  Social History Narrative  . Not on file   Social Determinants of Health   Financial Resource Strain: Not on file  Food Insecurity: Not on file  Transportation Needs: Not on file  Physical Activity: Not on file  Stress: Not on file  Social Connections: Not on file  Intimate Partner Violence: Not on file     Constitutional: Denies fever, malaise, fatigue, headache or abrupt weight changes.  Respiratory:  Denies difficulty breathing, shortness of breath, cough or sputum production.   Cardiovascular: Denies chest pain, chest tightness, palpitations or swelling in the hands or feet.  Gastrointestinal: Denies abdominal pain, bloating, constipation, diarrhea or blood in the stool.  Musculoskeletal: Pt reports knee pain. Denies decrease in range of motion, difficulty with gait, muscle pain or joint swelling.  Skin: Denies redness, rashes, lesions or ulcercations.  Neurological: Denies dizziness, difficulty with memory, difficulty with speech or problems with balance and coordination.    No other specific complaints in a complete review of systems (except as listed in HPI above).  Objective:   Physical Exam   BP 136/86   Pulse 76   Temp (!) 97.2 F (36.2 C) (Temporal)   Wt  263 lb (119.3 kg)   SpO2 98%   BMI 43.10 kg/m   Wt Readings from Last 3 Encounters:  01/13/19 273 lb (123.8 kg)  05/02/18 283 lb (128.4 kg)  01/08/18 276 lb (125.2 kg)    General: Appears her stated age, obese, in NAD. Skin: Warm, dry and intact. No ulcerations noted. HEENT: Head: normal shape and size; Eyes: sclera white, no icterus, conjunctiva pink, PERRLA and EOMs intact;  Neck:  Neck supple, trachea midline. No masses, lumps present.  Cardiovascular: Normal rate and rhythm. S1,S2 noted.  No murmur, rubs or gallops noted. No JVD or BLE edema. No carotid bruits noted. Pulmonary/Chest: Normal effort and positive vesicular breath sounds. No respiratory distress. No wheezes, rales or ronchi noted.  Musculoskeletal:  No difficulty with gait.  Neurological: Alert and oriented.   BMET    Component Value Date/Time   NA 141 05/20/2019 0838   K 3.9 05/20/2019 0838   CL 101 05/20/2019 0838   CO2 34 (H) 05/20/2019 0838   GLUCOSE 77 05/20/2019 0838   BUN 13 05/20/2019 0838   CREATININE 1.04 05/20/2019 0838   CALCIUM 8.9 05/20/2019 0838   GFRNONAA >60 01/10/2010 0915   GFRAA  01/10/2010 0915    >60        The eGFR has been calculated using the MDRD equation. This calculation has not been validated in all clinical situations. eGFR's persistently <60 mL/min signify possible Chronic Kidney Disease.    Lipid Panel     Component Value Date/Time   CHOL 221 (H) 05/20/2019 0838   TRIG 73.0 05/20/2019 0838   HDL 69.50 05/20/2019 0838   CHOLHDL 3 05/20/2019 0838   VLDL 14.6 05/20/2019 0838   LDLCALC 136 (H) 05/20/2019 0838    CBC    Component Value Date/Time   WBC 6.1 01/13/2019 1004   RBC 5.25 (H) 01/13/2019 1004   HGB 14.3 01/13/2019 1004   HCT 44.3 01/13/2019 1004   PLT 317.0 01/13/2019 1004   MCV 84.4 01/13/2019 1004   MCH 28.1 01/18/2010 0530   MCHC 32.3 01/13/2019 1004   RDW 15.3 01/13/2019 1004    Hgb A1C Lab Results  Component Value Date   HGBA1C 6.0  01/13/2019           Assessment & Plan:

## 2020-04-20 NOTE — Assessment & Plan Note (Signed)
Encouraged weight loss Continue Tylenol as needed She will continue to follow with orthopedics

## 2020-04-20 NOTE — Assessment & Plan Note (Signed)
CMET today Reinforced DASH diet and exercise for weight loss Continue Benazapril HCT

## 2020-04-20 NOTE — Assessment & Plan Note (Signed)
CMET and Lipid profile today Encouraged her to consume a low fat diet Continue Simvastatin 

## 2020-04-20 NOTE — Patient Instructions (Signed)

## 2020-04-22 NOTE — Addendum Note (Signed)
Addended by: Lurlean Nanny on: 04/22/2020 05:21 PM   Modules accepted: Orders

## 2020-04-30 MED ORDER — ATORVASTATIN CALCIUM 10 MG PO TABS: 10.0000 mg | ORAL_TABLET | Freq: Every day | ORAL | 0 refills | Status: DC

## 2020-04-30 MED ORDER — LEVOTHYROXINE SODIUM 150 MCG PO TABS: 150.0000 ug | ORAL_TABLET | Freq: Every day | ORAL | 0 refills | Status: DC

## 2020-04-30 NOTE — Addendum Note (Signed)
Addended by: Lurlean Nanny on: 04/30/2020 12:05 PM   Modules accepted: Orders

## 2020-04-30 NOTE — Addendum Note (Signed)
Addended by: Lurlean Nanny on: 04/30/2020 12:06 PM   Modules accepted: Orders

## 2020-05-10 ENCOUNTER — Other Ambulatory Visit: Payer: Self-pay | Admitting: Internal Medicine

## 2020-06-16 ENCOUNTER — Other Ambulatory Visit: Payer: Self-pay | Admitting: Obstetrics and Gynecology

## 2020-06-16 DIAGNOSIS — Z1231 Encounter for screening mammogram for malignant neoplasm of breast: Secondary | ICD-10-CM

## 2020-06-30 ENCOUNTER — Telehealth: Payer: Self-pay

## 2020-06-30 NOTE — Telephone Encounter (Signed)
[place] Day - Client TELEPHONE ADVICE RECORD Access[facility]urse Patient Name: Judith Ross Gender: Female DOB: [birth year] Age: 65 Y 45 M 19 D Return Phone Number: [phone] (Primary), [phone] (Secondary) Address: City/ State/ Zip: [place]  [address] Client [place] Day - Client Client Site [place] - Day Physician Webb Silversmith - NP Contact Type Call Who Is Calling Patient / Member / Family / Caregiver Call Type Triage / Clinical Caller Name Raquel Sarna Relationship To Patient Other Return Phone Number [phone] (Primary) Chief Complaint Abdominal Pain Reason for Call Symptomatic / Request for [place] is from [facility] and would like to have a pt triaged for abdominal pain. Translation No Nurse Assessment Nurse: D'Heur Lucia Gaskins, RN, [place] Date/Time (Eastern Time): 06/30/2020 2:26:29 PM Confirm and document reason for call. If symptomatic, describe symptoms. ---Caller is from [facility] and would like to have a patient triaged for abdominal pain that has been going on for two+ weeks. Pain is below the umbilicus on the right side. No fever. Does the patient have any new or worsening symptoms? ---Yes Will a triage be completed? ---Yes Related visit to physician within the last 2 weeks? ---No Does the PT have any chronic conditions? (i.e. diabetes, asthma, this includes High risk factors for pregnancy, etc.) ---Yes List chronic conditions. ---HTN, high cholesterol, no thyroid Is this a behavioral health or substance abuse call? ---No Guidelines Guideline Title Affirmed Question Affirmed Notes Nurse Date/Time Eilene Ghazi Time) Abdominal Pain - Female [1] MILDMODERATE pain AND [2] constant AND [3] present > 2 hours D'Heur Lucia Gaskins, RN, Vincente Liberty 06/30/2020 2:30:23 PM PLEASE NOTE: All timestamps contained  within this report are represented as [place] Standard Time. CONFIDENTIALTY NOTICE: This fax transmission is intended only for the addressee. It contains information that is legally privileged, confidential or otherwise protected from use or disclosure. If you are not the intended recipient, you are strictly prohibited from reviewing, disclosing, copying using or disseminating any of this information or taking any action in reliance on or regarding this information. If you have received this fax in error, please notify us immediately by telephone so that we can arrange for its return to [place]. Phone: [phone], Toll-Free: [phone], Fax: [phone] Page: 2 of 2 Call Id: [id number] [place]. Time Eilene Ghazi Time) Disposition Final User 06/30/2020 2:37:07 PM Called On-Call Provider D'Heur Lucia Gaskins, RN, [place] Reason: Called office for warm transfer, no appointments available, advised UC to patient. 06/30/2020 2:36:31 PM See HCP within 4 Hours (or PCP triage) Yes D'Heur Lucia Gaskins, RN, Vincente Liberty Caller Disagree/Comply Comply Caller Understands Yes PreDisposition Call Doctor Care Advice Given Per Guideline SEE HCP (OR PCP TRIAGE) WITHIN 4 HOURS: REST: * Lie down and rest. * Do this until seen. NOTHING BY MOUTH: * Do not eat or drink anything for now. CALL BACK IF: * You become worse CARE ADVICE given per Abdominal Pain, Female (Adult) guideline. Comments User: Vincente Liberty, D'Heur Lucia Gaskins, RN Date/Time Eilene Ghazi Time): 06/30/2020 2:31:46 PM Pain is mild, constant, and dull, Worsens when lying down. Referrals REFERRED TO PCP OFFICE

## 2020-06-30 NOTE — Telephone Encounter (Signed)
Patient called back stating that she was at [facility] in [place] when she received the call from the office. Patient stated that they checked her urine and it was fine. Patient stated that they did some blood work and will be in touch with her when they get the results back. Patient stated that she did have a lot of abdominal tenderness while being examined. Patient stated that the doctor could not find any reason why she was having the abdominal pain. Patient stated that the doctor she saw at [facility] suggested that she see her PCP and possibly have a CT scan done. Patient stated that the pain is mainly when she is laying down. Patient stated that the pain has been off and on for over two weeks. Patient was given ER precautions and she verbalized understanding.

## 2020-06-30 NOTE — Telephone Encounter (Signed)
Called and LVM for patient to return call. 

## 2020-07-01 NOTE — Telephone Encounter (Signed)
Let her know I am transitioning to [place]. She may want to make an appt to see another provider here in the office if pain persists. Agree with ER if pain worsens.

## 2020-07-05 ENCOUNTER — Ambulatory Visit: Payer: Medicare Other | Admitting: Family Medicine

## 2020-07-06 NOTE — Telephone Encounter (Signed)
Called patient and got her voicemail. Left a message on voicemail for patient to call the office back.  Patient has an appointment scheduled with Dr. Lorelei Pont 07/08/20 at 2:20 pm

## 2020-07-08 ENCOUNTER — Ambulatory Visit: Payer: Medicare Other | Admitting: Family Medicine

## 2020-07-14 ENCOUNTER — Other Ambulatory Visit: Payer: Self-pay

## 2020-07-14 ENCOUNTER — Encounter: Payer: Self-pay | Admitting: *Deleted

## 2020-07-14 ENCOUNTER — Ambulatory Visit (INDEPENDENT_AMBULATORY_CARE_PROVIDER_SITE_OTHER): Payer: Medicare Other | Admitting: Internal Medicine

## 2020-07-14 ENCOUNTER — Encounter: Payer: Self-pay | Admitting: Internal Medicine

## 2020-07-14 VITALS — BP 122/74 | HR 81 | Temp 97.2°F | Ht 66.0 in | Wt 255.0 lb

## 2020-07-14 DIAGNOSIS — R1084 Generalized abdominal pain: Secondary | ICD-10-CM | POA: Diagnosis not present

## 2020-07-14 DIAGNOSIS — R109 Unspecified abdominal pain: Secondary | ICD-10-CM | POA: Insufficient documentation

## 2020-07-14 LAB — COMPREHENSIVE METABOLIC PANEL
ALT: 10 U/L (ref 0–35)
AST: 13 U/L (ref 0–37)
Albumin: 4 g/dL (ref 3.5–5.2)
Alkaline Phosphatase: 87 U/L (ref 39–117)
BUN: 19 mg/dL (ref 6–23)
CO2: 29 mEq/L (ref 19–32)
Calcium: 9.1 mg/dL (ref 8.4–10.5)
Chloride: 98 mEq/L (ref 96–112)
Creatinine, Ser: 1.19 mg/dL (ref 0.40–1.20)
GFR: 47.98 mL/min — ABNORMAL LOW (ref >60.00)
Glucose, Bld: 95 mg/dL (ref 70–99)
Potassium: 4.2 mEq/L (ref 3.5–5.1)
Sodium: 139 mEq/L (ref 135–145)
Total Bilirubin: 0.4 mg/dL (ref 0.2–1.2)
Total Protein: 7.8 g/dL (ref 6.0–8.3)

## 2020-07-14 LAB — CBC
HCT: 38.8 % (ref 36.0–46.0)
Hemoglobin: 12.4 g/dL (ref 12.0–15.0)
MCHC: 31.8 g/dL (ref 30.0–36.0)
MCV: 79.7 fl (ref 78.0–100.0)
Platelets: 525 10*3/uL — ABNORMAL HIGH (ref 150.0–400.0)
RBC: 4.87 Mil/uL (ref 3.87–5.11)
RDW: 14.3 % (ref 11.5–15.5)
WBC: 9 10*3/uL (ref 4.0–10.5)

## 2020-07-14 LAB — POC URINALSYSI DIPSTICK (AUTOMATED)
Blood, UA: NEGATIVE
Glucose, UA: NEGATIVE
Nitrite, UA: NEGATIVE
Protein, UA: POSITIVE — AB
Spec Grav, UA: 1.025 (ref 1.010–1.025)
Urobilinogen, UA: 0.2 E.U./dL
pH, UA: 6 (ref 5.0–8.0)

## 2020-07-14 LAB — SEDIMENTATION RATE: Sed Rate: 106 mm/hr — ABNORMAL HIGH (ref 0–30)

## 2020-07-14 LAB — LIPASE: Lipase: 8 U/L — ABNORMAL LOW (ref 11.0–59.0)

## 2020-07-14 NOTE — Assessment & Plan Note (Addendum)
Persists for a couple of months Notices it especially with cough/sneeze but no clear injuries Could be internal hernia Appetite is off and has lost some weight Blood/urine okay at [facility]--results not available  Will check urine--- slight leukocytes but negative nitrites. Will send for culture but no empiric Rx Review blood tests---recheck CT abdomen GI consultation

## 2020-07-14 NOTE — Progress Notes (Signed)
Subjective:    Patient ID: Judith Ross, female    DOB: Mar 11, 1956, 65 y.o.   MRN: [id number]  HPI Here due to lower abdominal pain This visit occurred during the SARS-CoV-2 public health emergency.  Safety protocols were in place, including screening questions prior to the visit, additional usage of staff PPE, and extensive cleaning of exam room while observing appropriate contact time as indicated for disinfecting solutions.   Started in suprapubic area and then right side Now all over Notices it worse with cough, sneeze, etc Goes back a month or 2 Better if sitting up--but worse if leans over or lying down  No apparent bulging or hernia  Appetite is off---has lost some weight Had hysterectomy --not sure about ovaries No N/V Eating doesn't affect the pain Bowels move regularly---pretty much daily. No blood. May feel the pain with moving bowels but not bad  Retired last year No known injury--no heavy lifting, etc  Pain not associated with voiding--though rare dysuria No hematuria  Did have urgent care visit for this a few weeks ago Supposedly blood and urine tests okay  Current Outpatient Medications on File Prior to Visit  Medication Sig Dispense Refill  . atorvastatin (LIPITOR) 10 MG tablet Take 1 tablet (10 mg total) by mouth daily. 90 tablet 0  . benazepril (LOTENSIN) 20 MG tablet TAKE 1 TABLET BY MOUTH DAILY 90 tablet 2  . clobetasol ointment (TEMOVATE) 0.05 % Apply to affected area every night for 4 weeks, then every other day for 4 weeks and then twice a week for 4 weeks or until resolution. 30 g 5  . hydrochlorothiazide (HYDRODIURIL) 25 MG tablet Take 1 tablet (25 mg total) by mouth daily. 90 tablet 1  . levothyroxine (SYNTHROID) 150 MCG tablet Take 1 tablet (150 mcg total) by mouth daily. 90 tablet 0   No current facility-administered medications on file prior to visit.    Allergies  Allergen Reactions  . Erythromycin Itching    Past Medical History:   Diagnosis Date  . Arthritis   . Chicken pox   . Hyperlipidemia   . Hypertension   . Obesity   . Sleep apnea   . Thyroid disease     Past Surgical History:  Procedure Laterality Date  . ABDOMINAL HYSTERECTOMY  12/2009   total  . TOTAL THYROIDECTOMY  1991-92  . TUBAL LIGATION      Family History  Problem Relation Age of Onset  . Stroke Father   . Diabetes Maternal Grandmother   . Hypertension Maternal Grandmother   . Diabetes Maternal Uncle   . Cancer Maternal Aunt        Breast    Social History   Socioeconomic History  . Marital status: Divorced    Spouse name: Not on file  . Number of children: Not on file  . Years of education: Not on file  . Highest education level: Not on file  Occupational History  . Not on file  Tobacco Use  . Smoking status: Never Smoker  . Smokeless tobacco: Never Used  Substance and Sexual Activity  . Alcohol use: No    Alcohol/week: 0.0 standard drinks  . Drug use: No  . Sexual activity: Yes  Other Topics Concern  . Not on file  Social History Narrative  . Not on file   Social Determinants of Health   Financial Resource Strain: Not on file  Food Insecurity: Not on file  Transportation Needs: Not on file  Physical Activity: Not  on file  Stress: Not on file  Social Connections: Not on file  Intimate Partner Violence: Not on file   Review of Systems  No colon cancer screening --preferred not to have it No bloating No cough or breathing problems--does have easy DOE     Objective:   Physical Exam Constitutional:      Appearance: She is obese.  Cardiovascular:     Rate and Rhythm: Normal rate and regular rhythm.     Heart sounds: No murmur heard. No gallop.   Pulmonary:     Effort: Pulmonary effort is normal.     Breath sounds: Normal breath sounds. No wheezing or rales.  Abdominal:     Palpations: There is no mass.     Hernia: A hernia is present.     Comments: Tenderness in mid abdomen both upper and lower (not  specifically suprapubic) No RUQ tenderness  Musculoskeletal:     Cervical back: Neck supple.     Right lower leg: No edema.     Left lower leg: No edema.  Lymphadenopathy:     Cervical: No cervical adenopathy.  Neurological:     Mental Status: She is alert.            Assessment & Plan:

## 2020-07-14 NOTE — Addendum Note (Signed)
Addended by: Pilar Grammes on: 07/14/2020 11:42 AM   Modules accepted: Orders

## 2020-07-16 ENCOUNTER — Other Ambulatory Visit: Payer: Self-pay

## 2020-07-16 LAB — URINE CULTURE
MICRO NUMBER:: 11820979
SPECIMEN QUALITY:: ADEQUATE

## 2020-07-16 MED ORDER — SULFAMETHOXAZOLE-TRIMETHOPRIM 800-160 MG PO TABS: 1.0000 | ORAL_TABLET | Freq: Two times a day (BID) | ORAL | 0 refills | Status: AC

## 2020-07-20 ENCOUNTER — Ambulatory Visit: Payer: Medicare Other | Admitting: Gastroenterology

## 2020-07-20 ENCOUNTER — Other Ambulatory Visit: Payer: Self-pay

## 2020-07-20 ENCOUNTER — Encounter: Payer: Self-pay | Admitting: Gastroenterology

## 2020-07-20 VITALS — BP 123/83 | HR 98 | Ht 66.0 in | Wt 254.6 lb

## 2020-07-20 DIAGNOSIS — R634 Abnormal weight loss: Secondary | ICD-10-CM

## 2020-07-20 DIAGNOSIS — R109 Unspecified abdominal pain: Secondary | ICD-10-CM

## 2020-07-20 DIAGNOSIS — R718 Other abnormality of red blood cells: Secondary | ICD-10-CM

## 2020-07-20 MED ORDER — DICYCLOMINE HCL 10 MG PO CAPS: 10.0000 mg | ORAL_CAPSULE | Freq: Four times a day (QID) | ORAL | 3 refills | Status: DC

## 2020-07-20 MED ORDER — NA SULFATE-K SULFATE-MG SULF 17.5-3.13-1.6 GM/177ML PO SOLN: 1.0000 | Freq: Once | ORAL | 0 refills | Status: AC

## 2020-07-20 NOTE — Progress Notes (Signed)
Jonathon Bellows MD, MRCP(U.K) [address]  [place]  [place], [place] [address]  Main: [phone]  Fax: [phone]   Gastroenterology Consultation  Referring Provider:     Venia Carbon, MD Primary Care Physician:  Jearld Fenton, NP Primary Gastroenterologist:  Dr. Jonathon Bellows  Reason for Consultation:     Abdominal pain        HPI:   Judith Ross is a 65 y.o. y/o female referred for consultation & management  by  Jearld Fenton, NP.    Abdominal pain: Onset: Few months back Site : Over her entire abdomen Radiation: Usually nonradiating and seen in the lower part of the abdomen previously but now all over the abdomen. Severity : Moderate Nature of pain: Pressure Aggravating factors: Sitting up Relieving factors : Laying down Weight loss: 10 pounds over few months NSAID use: None PPI use : None  Frequency of bowel movements: Daily and soft Change in bowel movements: None Relief with bowel movements: None Gas/Bloating/Abdominal distension: Some but not related to intensity of the abdominal pain No prior EGD or colonoscopy Scheduled for CT abdomen on 08/02/2020 07/14/2020: Hemoglobin 12.4 g with an elevated platelet count and an MCV of 79.  Creatinine 1.19 otherwise LFTs normal.  Lipase 8.  Treated for an UTI on 07/15/2019.  Past Medical History:  Diagnosis Date  . Arthritis   . Chicken pox   . Hyperlipidemia   . Hypertension   . Obesity   . Sleep apnea   . Thyroid disease     Past Surgical History:  Procedure Laterality Date  . ABDOMINAL HYSTERECTOMY  12/2009   total  . TOTAL THYROIDECTOMY  1991-92  . TUBAL LIGATION      Prior to Admission medications   Medication Sig Start Date End Date Taking? Authorizing Provider  atorvastatin (LIPITOR) 10 MG tablet Take 1 tablet (10 mg total) by mouth daily. 04/30/20   Jearld Fenton, NP  benazepril (LOTENSIN) 20 MG tablet TAKE 1 TABLET BY MOUTH DAILY 05/11/20   Jearld Fenton, NP  clobetasol ointment  (TEMOVATE) 0.05 % Apply to affected area every night for 4 weeks, then every other day for 4 weeks and then twice a week for 4 weeks or until resolution. 05/14/17   [name], MD  hydrochlorothiazide (HYDRODIURIL) 25 MG tablet Take 1 tablet (25 mg total) by mouth daily. 08/13/19   Jearld Fenton, NP  levothyroxine (SYNTHROID) 150 MCG tablet Take 1 tablet (150 mcg total) by mouth daily. 04/30/20   Jearld Fenton, NP  sulfamethoxazole-trimethoprim (BACTRIM DS) 800-160 MG tablet Take 1 tablet by mouth 2 (two) times daily for 5 days. 07/16/20 07/21/20  Venia Carbon, MD    Family History  Problem Relation Age of Onset  . Stroke Father   . Diabetes Maternal Grandmother   . Hypertension Maternal Grandmother   . Diabetes Maternal Uncle   . Cancer Maternal Aunt        Breast     Social History   Tobacco Use  . Smoking status: Never Smoker  . Smokeless tobacco: Never Used  Substance Use Topics  . Alcohol use: No    Alcohol/week: 0.0 standard drinks  . Drug use: No    Allergies as of 07/20/2020 - Review Complete 07/20/2020  Allergen Reaction Noted  . Erythromycin Itching 02/01/2009    Review of Systems:    All systems reviewed and negative except where noted in HPI.   Physical Exam:  BP  123/83 (BP Location: Left Arm, Patient Position: Sitting, Cuff Size: Large)   Pulse 98   Ht 5\' 6"  (1.676 m)   Wt 254 lb 9.6 oz (115.5 kg)   BMI 41.09 kg/m  No LMP recorded. Patient has had a hysterectomy. Psych:  Alert and cooperative. Normal mood and affect. General:   Alert,  Well-developed, well-nourished, pleasant and cooperative in NAD Head:  Normocephalic and atraumatic. Eyes:  Sclera clear, no icterus.   Conjunctiva pink. Lungs:  Respirations even and unlabored.  Clear throughout to auscultation.   No wheezes, crackles, or rhonchi. No acute distress. Heart:  Regular rate and rhythm; no murmurs, clicks, rubs, or gallops. Abdomen:  Normal bowel sounds.  No bruits.  Soft, non-tender  and non-distended without masses, hepatosplenomegaly or hernias noted.  No guarding or rebound tenderness.    Neurologic:  Alert and oriented x3;  grossly normal neurologically. Psych:  Alert and cooperative. Normal mood and affect.  Imaging Studies: No results found.  Assessment and Plan:   Judith Ross is a 65 y.o. y/o female has been referred for generalized abdominal pain ongoing for a few months with over 10 pounds weight loss which is unintentional.  No prior endoscopy or colonoscopy.  The nature and description of her abdominal pain is not specific.  Also noted recent labs suggesting microcytosis and elevated platelet count  Plan 1.  EGD and colonoscopy to evaluate abdominal pain and unintentional weight loss 2.  Dicyclomine 10 mg 4 times daily as needed for pain 3.  She is already scheduled for a CAT scan of the abdomen which we shall follow-up at her next visit 4.  For microcytosis suggest checking B12 folate and iron studies.  Repeat platelet count to ensure still not elevated.  Could be a marker of acute inflammation 5.  H. pylori breath test  I have discussed alternative options, risks & benefits,  which include, but are not limited to, bleeding, infection, perforation,respiratory complication & drug reaction.  The patient agrees with this plan & written consent will be obtained.    Follow up in 6 to 8 weeks  Dr Jonathon Bellows MD,MRCP(U.K)

## 2020-07-21 ENCOUNTER — Ambulatory Visit: Payer: Medicare Other | Admitting: Family Medicine

## 2020-07-26 ENCOUNTER — Other Ambulatory Visit: Payer: Self-pay | Admitting: Internal Medicine

## 2020-07-27 ENCOUNTER — Other Ambulatory Visit: Payer: Self-pay

## 2020-07-27 ENCOUNTER — Other Ambulatory Visit (INDEPENDENT_AMBULATORY_CARE_PROVIDER_SITE_OTHER): Payer: Medicare Other

## 2020-07-27 DIAGNOSIS — E890 Postprocedural hypothyroidism: Secondary | ICD-10-CM | POA: Diagnosis not present

## 2020-07-27 DIAGNOSIS — E7800 Pure hypercholesterolemia, unspecified: Secondary | ICD-10-CM | POA: Diagnosis not present

## 2020-07-27 DIAGNOSIS — I10 Essential (primary) hypertension: Secondary | ICD-10-CM | POA: Diagnosis not present

## 2020-07-27 LAB — COMPREHENSIVE METABOLIC PANEL
ALT: 9 U/L (ref 0–35)
AST: 14 U/L (ref 0–37)
Albumin: 3.9 g/dL (ref 3.5–5.2)
Alkaline Phosphatase: 89 U/L (ref 39–117)
BUN: 27 mg/dL — ABNORMAL HIGH (ref 6–23)
CO2: 30 mEq/L (ref 19–32)
Calcium: 8.9 mg/dL (ref 8.4–10.5)
Chloride: 98 mEq/L (ref 96–112)
Creatinine, Ser: 1.67 mg/dL — ABNORMAL HIGH (ref 0.40–1.20)
GFR: 31.94 mL/min — ABNORMAL LOW (ref >60.00)
Glucose, Bld: 98 mg/dL (ref 70–99)
Potassium: 4.7 mEq/L (ref 3.5–5.1)
Sodium: 137 mEq/L (ref 135–145)
Total Bilirubin: 0.5 mg/dL (ref 0.2–1.2)
Total Protein: 7.1 g/dL (ref 6.0–8.3)

## 2020-07-27 LAB — TSH: TSH: 0.34 u[IU]/mL — ABNORMAL LOW (ref 0.35–4.50)

## 2020-07-27 LAB — LIPID PANEL
Cholesterol: 198 mg/dL (ref 0–200)
HDL: 47.4 mg/dL (ref >39.00)
LDL Cholesterol: 131 mg/dL — ABNORMAL HIGH (ref 0–99)
NonHDL: 150.11
Total CHOL/HDL Ratio: 4
Triglycerides: 95 mg/dL (ref 0.0–149.0)
VLDL: 19 mg/dL (ref 0.0–40.0)

## 2020-07-28 ENCOUNTER — Telehealth: Payer: Self-pay

## 2020-07-28 NOTE — Telephone Encounter (Signed)
Patient had labs completed yesterday. Patient was under Webb Silversmith, NP care, but has chosen to stay at [facility] and establish with a provider here at the office. Please advise.

## 2020-07-28 NOTE — Telephone Encounter (Signed)
Please get her scheduled when possible, with me if needed.  Thanks.

## 2020-07-28 NOTE — Telephone Encounter (Signed)
Noted. Patient does not need an appt at this time but can schedule at any time if needed.

## 2020-07-29 NOTE — Telephone Encounter (Signed)
Pt is staying at [facility], had recent labs and has upcoming f/u appt with Dr Glori Bickers

## 2020-07-31 LAB — PLATELET COUNT: Platelets: 554 10*3/uL — ABNORMAL HIGH (ref 150–450)

## 2020-07-31 LAB — IRON,TIBC AND FERRITIN PANEL
Ferritin: 113 ng/mL (ref 15–150)
Iron Saturation: 8 % — CL (ref 15–55)
Iron: 19 ug/dL — ABNORMAL LOW (ref 27–139)
Total Iron Binding Capacity: 242 ug/dL — ABNORMAL LOW (ref 250–450)
UIBC: 223 ug/dL (ref 118–369)

## 2020-07-31 LAB — B12 AND FOLATE PANEL
Folate: 5.3 ng/mL
Vitamin B-12: 778 pg/mL (ref 232–1245)

## 2020-08-02 ENCOUNTER — Ambulatory Visit
Admission: RE | Admit: 2020-08-02 | Discharge: 2020-08-02 | Disposition: A | Discharge status: Home / Self Care | Payer: Medicare Other | Source: Ambulatory Visit | Attending: Internal Medicine | Admitting: Internal Medicine

## 2020-08-02 ENCOUNTER — Other Ambulatory Visit: Payer: Self-pay

## 2020-08-02 DIAGNOSIS — R1084 Generalized abdominal pain: Secondary | ICD-10-CM | POA: Diagnosis not present

## 2020-08-02 MED ORDER — IOHEXOL 300 MG/ML  SOLN
75.0000 mL | Freq: Once | INTRAMUSCULAR | Status: AC | PRN
  Administered 2020-08-02: 75 mL via INTRAVENOUS

## 2020-08-03 ENCOUNTER — Other Ambulatory Visit: Payer: Self-pay | Admitting: Internal Medicine

## 2020-08-03 DIAGNOSIS — C762 Malignant neoplasm of abdomen: Secondary | ICD-10-CM

## 2020-08-03 NOTE — Progress Notes (Signed)
[facility] oncology office has scheduled patient for 08/05/20

## 2020-08-03 NOTE — Progress Notes (Signed)
Please make sure they get her in ASAP--should be this week

## 2020-08-05 ENCOUNTER — Encounter: Payer: Self-pay | Admitting: Oncology

## 2020-08-05 ENCOUNTER — Telehealth: Payer: Self-pay

## 2020-08-05 ENCOUNTER — Other Ambulatory Visit: Payer: Self-pay | Admitting: Internal Medicine

## 2020-08-05 ENCOUNTER — Other Ambulatory Visit: Payer: Self-pay

## 2020-08-05 ENCOUNTER — Inpatient Hospital Stay: Payer: Medicare Other

## 2020-08-05 ENCOUNTER — Ambulatory Visit: Payer: Medicare Other | Admitting: Family Medicine

## 2020-08-05 ENCOUNTER — Inpatient Hospital Stay: Payer: Medicare Other | Attending: Oncology | Admitting: Oncology

## 2020-08-05 VITALS — BP 118/104 | HR 91 | Temp 97.3°F | Resp 18 | Ht 66.0 in | Wt 252.5 lb

## 2020-08-05 DIAGNOSIS — E785 Hyperlipidemia, unspecified: Secondary | ICD-10-CM | POA: Diagnosis not present

## 2020-08-05 DIAGNOSIS — Z803 Family history of malignant neoplasm of breast: Secondary | ICD-10-CM | POA: Diagnosis not present

## 2020-08-05 DIAGNOSIS — C786 Secondary malignant neoplasm of retroperitoneum and peritoneum: Secondary | ICD-10-CM

## 2020-08-05 DIAGNOSIS — Z823 Family history of stroke: Secondary | ICD-10-CM | POA: Insufficient documentation

## 2020-08-05 DIAGNOSIS — C482 Malignant neoplasm of peritoneum, unspecified: Secondary | ICD-10-CM | POA: Diagnosis not present

## 2020-08-05 DIAGNOSIS — I10 Essential (primary) hypertension: Secondary | ICD-10-CM | POA: Diagnosis not present

## 2020-08-05 DIAGNOSIS — R109 Unspecified abdominal pain: Secondary | ICD-10-CM | POA: Diagnosis not present

## 2020-08-05 DIAGNOSIS — R634 Abnormal weight loss: Secondary | ICD-10-CM | POA: Insufficient documentation

## 2020-08-05 DIAGNOSIS — C762 Malignant neoplasm of abdomen: Secondary | ICD-10-CM

## 2020-08-05 DIAGNOSIS — R971 Elevated cancer antigen 125 [CA 125]: Secondary | ICD-10-CM | POA: Diagnosis not present

## 2020-08-05 DIAGNOSIS — Z833 Family history of diabetes mellitus: Secondary | ICD-10-CM | POA: Diagnosis not present

## 2020-08-05 DIAGNOSIS — R970 Elevated carcinoembryonic antigen [CEA]: Secondary | ICD-10-CM | POA: Insufficient documentation

## 2020-08-05 DIAGNOSIS — E039 Hypothyroidism, unspecified: Secondary | ICD-10-CM | POA: Diagnosis not present

## 2020-08-05 DIAGNOSIS — Z8249 Family history of ischemic heart disease and other diseases of the circulatory system: Secondary | ICD-10-CM | POA: Insufficient documentation

## 2020-08-05 DIAGNOSIS — Z79899 Other long term (current) drug therapy: Secondary | ICD-10-CM | POA: Insufficient documentation

## 2020-08-05 DIAGNOSIS — R7989 Other specified abnormal findings of blood chemistry: Secondary | ICD-10-CM | POA: Diagnosis not present

## 2020-08-05 LAB — CBC WITH DIFFERENTIAL/PLATELET
Abs Immature Granulocytes: 0.04 10*3/uL (ref 0.00–0.07)
Basophils Absolute: 0.1 10*3/uL (ref 0.0–0.1)
Basophils Relative: 1 %
Eosinophils Absolute: 0.2 10*3/uL (ref 0.0–0.5)
Eosinophils Relative: 2 %
HCT: 38.5 % (ref 36.0–46.0)
Hemoglobin: 11.9 g/dL — ABNORMAL LOW (ref 12.0–15.0)
Immature Granulocytes: 0 %
Lymphocytes Relative: 23 %
Lymphs Abs: 2 10*3/uL (ref 0.7–4.0)
MCH: 25.1 pg — ABNORMAL LOW (ref 26.0–34.0)
MCHC: 30.9 g/dL (ref 30.0–36.0)
MCV: 81.2 fL (ref 80.0–100.0)
Monocytes Absolute: 0.6 10*3/uL (ref 0.1–1.0)
Monocytes Relative: 6 %
Neutro Abs: 6.2 10*3/uL (ref 1.7–7.7)
Neutrophils Relative %: 68 %
Platelets: 562 10*3/uL — ABNORMAL HIGH (ref 150–400)
RBC: 4.74 MIL/uL (ref 3.87–5.11)
RDW: 14.3 % (ref 11.5–15.5)
WBC: 9.1 10*3/uL (ref 4.0–10.5)
nRBC: 0 % (ref 0.0–0.2)

## 2020-08-05 LAB — COMPREHENSIVE METABOLIC PANEL
ALT: 12 U/L (ref 0–44)
AST: 18 U/L (ref 15–41)
Albumin: 3.5 g/dL (ref 3.5–5.0)
Alkaline Phosphatase: 85 U/L (ref 38–126)
Anion gap: 12 (ref 5–15)
BUN: 17 mg/dL (ref 8–23)
CO2: 28 mmol/L (ref 22–32)
Calcium: 8.4 mg/dL — ABNORMAL LOW (ref 8.9–10.3)
Chloride: 100 mmol/L (ref 98–111)
Creatinine, Ser: 1.15 mg/dL — ABNORMAL HIGH (ref 0.44–1.00)
GFR, Estimated: 53 mL/min — ABNORMAL LOW (ref >60)
Glucose, Bld: 109 mg/dL — ABNORMAL HIGH (ref 70–99)
Potassium: 4.3 mmol/L (ref 3.5–5.1)
Sodium: 140 mmol/L (ref 135–145)
Total Bilirubin: 0.7 mg/dL (ref 0.3–1.2)
Total Protein: 8.1 g/dL (ref 6.5–8.1)

## 2020-08-05 NOTE — Telephone Encounter (Signed)
Order placed for biopsy of peritoneal carcinomatosis. Invasive checklist sent. Colonoscopy scheduled for 5/20 with Dr. Vicente Males.

## 2020-08-05 NOTE — Progress Notes (Signed)
[facility]  [phone] [phone]) [phone]  ID: KAIDENCE SANT OB: [birth year]  MR#: [id number]  HYI#:[id number]  Patient Care Team: Jearld Fenton, NP as PCP - General (Internal Medicine) Clent Jacks, RN as Oncology Nurse Navigator  CHIEF COMPLAINT: Peritoneal carcinomatosis  INTERVAL HISTORY: Patient is a 65 year old female who has had increasing abdominal pain and weight loss for the past several months.  She reports approximately 20 pound unintentional weight loss.  Subsequent CT scan suspicious for peritoneal carcinomatosis.  Patient had a total hysterectomy in 2011.  She otherwise feels well.  She has no neurologic complaints.  She denies any recent fevers or illnesses.  She has a good appetite.  She has no chest pain, shortness of breath, cough, or hemoptysis.  She denies any nausea, vomiting, constipation, or diarrhea.  She has no melena or hematochezia.  She has no urinary complaints.  Patient otherwise feels well and offers no further specific complaints today.  REVIEW OF SYSTEMS:   Review of Systems  Constitutional: Negative.  Negative for fever, malaise/fatigue and weight loss.  Respiratory: Negative.  Negative for cough, hemoptysis and shortness of breath.   Cardiovascular: Negative.  Negative for chest pain and leg swelling.  Gastrointestinal: Positive for abdominal pain. Negative for blood in stool, constipation, diarrhea, melena, nausea and vomiting.  Genitourinary: Negative.  Negative for dysuria.  Musculoskeletal: Negative.  Negative for back pain.  Skin: Negative.  Negative for rash.  Neurological: Negative.  Negative for dizziness, focal weakness, weakness and headaches.  Psychiatric/Behavioral: Negative.  The patient is not nervous/anxious.     As per HPI. Otherwise, a complete review of systems is negative.  PAST MEDICAL HISTORY: Past Medical History:  Diagnosis Date  . Arthritis   . Chicken pox   . Hyperlipidemia   .  Hypertension   . Obesity   . Sleep apnea   . Thyroid disease     PAST SURGICAL HISTORY: Past Surgical History:  Procedure Laterality Date  . ABDOMINAL HYSTERECTOMY  12/2009   total  . supracervical abdominal hysterectomy and bilateral salpingo--oophorectomy 01-17-2010 for fibroids  01/17/2010  . TOTAL THYROIDECTOMY  1991-92  . TUBAL LIGATION      FAMILY HISTORY: Family History  Problem Relation Age of Onset  . Stroke Father   . Diabetes Maternal Grandmother   . Hypertension Maternal Grandmother   . Diabetes Maternal Uncle   . Cancer Maternal Aunt        Breast    ADVANCED DIRECTIVES (Y/N):  N  HEALTH MAINTENANCE: Social History   Tobacco Use  . Smoking status: Never Smoker  . Smokeless tobacco: Never Used  Substance Use Topics  . Alcohol use: No    Alcohol/week: 0.0 standard drinks  . Drug use: No     Colonoscopy:  PAP:  Bone density:  Lipid panel:  Allergies  Allergen Reactions  . Erythromycin Itching    Current Outpatient Medications  Medication Sig Dispense Refill  . atorvastatin (LIPITOR) 10 MG tablet TAKE 1 TABLET(10 MG) BY MOUTH DAILY 90 tablet 0  . benazepril (LOTENSIN) 20 MG tablet TAKE 1 TABLET BY MOUTH DAILY 90 tablet 2  . clobetasol ointment (TEMOVATE) 0.05 % Apply to affected area every night for 4 weeks, then every other day for 4 weeks and then twice a week for 4 weeks or until resolution. 30 g 5  . hydrochlorothiazide (HYDRODIURIL) 25 MG tablet Take 1 tablet (25 mg total) by mouth daily. 90 tablet 1  . levothyroxine (SYNTHROID) 150  MCG tablet Take 1 tablet (150 mcg total) by mouth daily. 90 tablet 0  . dicyclomine (BENTYL) 10 MG capsule Take 1 capsule (10 mg total) by mouth in the morning, at noon, in the evening, and at bedtime. 90 capsule 3   No current facility-administered medications for this visit.    OBJECTIVE: Vitals:   08/05/20 0921  BP: (!) 118/104  Pulse: 91  Resp: 18  Temp: (!) 97.3 F (36.3 C)  SpO2: 98%     Body mass  index is 40.75 kg/m.    ECOG FS:1 - Symptomatic but completely ambulatory  General: Well-developed, well-nourished, no acute distress. Eyes: Pink conjunctiva, anicteric sclera. HEENT: Normocephalic, moist mucous membranes. Lungs: No audible wheezing or coughing. Heart: Regular rate and rhythm. Abdomen: Soft, nontender, no obvious distention. Musculoskeletal: No edema, cyanosis, or clubbing. Neuro: Alert, answering all questions appropriately. Cranial nerves grossly intact. Skin: No rashes or petechiae noted. Psych: Normal affect. Lymphatics: No cervical, calvicular, axillary or inguinal LAD.   LAB RESULTS:  Lab Results  Component Value Date   NA 137 07/27/2020   K 4.7 07/27/2020   CL 98 07/27/2020   CO2 30 07/27/2020   GLUCOSE 98 07/27/2020   BUN 27 (H) 07/27/2020   CREATININE 1.67 (H) 07/27/2020   CALCIUM 8.9 07/27/2020   PROT 7.1 07/27/2020   ALBUMIN 3.9 07/27/2020   AST 14 07/27/2020   ALT 9 07/27/2020   ALKPHOS 89 07/27/2020   BILITOT 0.5 07/27/2020   GFRNONAA >60 01/10/2010   GFRAA  01/10/2010    >60        The eGFR has been calculated using the MDRD equation. This calculation has not been validated in all clinical situations. eGFR's persistently <60 mL/min signify possible Chronic Kidney Disease.    Lab Results  Component Value Date   WBC 9.1 08/05/2020   NEUTROABS 6.2 08/05/2020   HGB 11.9 (L) 08/05/2020   HCT 38.5 08/05/2020   MCV 81.2 08/05/2020   PLT 562 (H) 08/05/2020     STUDIES: CT Abdomen Pelvis W Contrast  Result Date: 08/03/2020 CLINICAL DATA:  Mid and lower abdominal pain for 2 months. Early satiety. EXAM: CT ABDOMEN AND PELVIS WITH CONTRAST TECHNIQUE: Multidetector CT imaging of the abdomen and pelvis was performed using the standard protocol following bolus administration of intravenous contrast. CONTRAST:  19m OMNIPAQUE IOHEXOL 300 MG/ML  SOLN COMPARISON:  None. FINDINGS: Lower Chest: Tiny right pleural effusion noted. Hepatobiliary:  No hepatic masses identified. Gallbladder is unremarkable. No evidence of biliary ductal dilatation. Mild diffuse peritoneal thickening or minimal ascites throughout the perihepatic space. Pancreas:  No mass or inflammatory changes. Spleen: Within normal limits in size and appearance. Adrenals/Urinary Tract: No masses identified. No evidence of ureteral calculi or hydronephrosis. Stomach/Bowel: Short segment area of luminal narrowing and wall thickening is seen involving the transverse colon with adjacent omental carcinomatosis. This could represent the site of a primary colon carcinoma, versus metastatic disease causing extrinsic narrowing of the colon. Vascular/Lymphatic: No pathologically enlarged lymph nodes. No acute vascular findings. Reproductive: Patient has history of previous hysterectomy. A soft tissue density is seen in the central pelvic hysterectomy bed abutting the vaginal cuff which measures 5.8 x 3.4 cm on image 80/2. Mild ascites is seen in the pelvis and bilateral paracolic gutters. Diffuse omental caking is seen as well as soft tissue stranding and nodularity throughout the peritoneal fat, consistent with peritoneal carcinomatosis. Other:  None. Musculoskeletal:  No suspicious bone lesions identified. IMPRESSION: Diffuse peritoneal carcinomatosis throughout abdomen and  pelvis, with mild ascites. 6 cm central pelvic soft tissue mass in hysterectomy bed, consistent with recurrent/metastatic carcinoma. Short segment luminal narrowing and wall thickening involving the transverse colon. This could represent a primary colon carcinoma, versus extrinsic narrowing of the colon due to adjacent omental carcinomatosis. Tiny right pleural effusion. These results will be called to the ordering clinician or representative by the Radiologist Assistant, and communication documented in the PACS or [employer]. Electronically Signed   By: Marlaine Hind M.D.   On: 08/03/2020 09:40    ASSESSMENT: Peritoneal  carcinomatosis.  PLAN:    1.  Peritoneal carcinomatosis: CT scan results from Aug 03, 2020 reviewed independently and reported as above.  This is highly suspicious for malignancy despite the fact the patient had a total hysterectomy in 2011.  Tumor markers are pending at time of dictation.  Will get a CT-guided biopsy in the next 1 to 2 weeks and then patient will follow-up several days after her biopsy.  She will also have gynecology oncology evaluation at that time. 2.  Abdominal pain: Patient declined treatment at this time, monitor.  I spent a total of 60 minutes reviewing chart data, face-to-face evaluation with the patient, counseling and coordination of care as detailed above.   Patient expressed understanding and was in agreement with this plan. She also understands that She can call clinic at any time with any questions, concerns, or complaints.   Cancer Staging No matching staging information was found for the patient.  Lloyd Huger, MD   08/05/2020 10:18 AM

## 2020-08-05 NOTE — Progress Notes (Signed)
01/17/2010 Supracervical abdominal hysterectomy and bilateral salpingo--oophorectomy for fibroids.  1. Uterus, ovaries and fallopian tubes, :  - LEIOMYOMATA AND ADENOMYOSIS.  - ENDOMETRIUM: BENIGN PROLIFERATIVE ENDOMETRIUM, NO ATYPIA, HYPERPLASIA OR  MALIGNANCY.  - BILATERAL FALLOPIAN TUBES: NO PATHOLOGIC ABNORMALITIES.  - LEFT OVARY: BENIGN SEROUS CYSTADENOMA AND ENDOSALPINGIOSIS, NO ATYPIA OR  MALIGNANCY.  - RIGHT OVARY: OVARIAN TISSUE WITH CORPUS LUTEAL CYST AND ENDOMETRIOSIS, NO  ATYPIA OR MALIGNANCY.   DATE SIGNED OUT: 01/18/2010

## 2020-08-06 ENCOUNTER — Encounter: Admission: RE | Payer: Self-pay | Source: Home / Self Care

## 2020-08-06 ENCOUNTER — Telehealth: Payer: Self-pay

## 2020-08-06 ENCOUNTER — Other Ambulatory Visit: Payer: Self-pay | Admitting: *Deleted

## 2020-08-06 ENCOUNTER — Ambulatory Visit: Admission: RE | Admit: 2020-08-06 | Payer: Medicare Other | Source: Home / Self Care | Admitting: Gastroenterology

## 2020-08-06 ENCOUNTER — Other Ambulatory Visit: Payer: Self-pay | Admitting: Radiology

## 2020-08-06 DIAGNOSIS — C786 Secondary malignant neoplasm of retroperitoneum and peritoneum: Secondary | ICD-10-CM

## 2020-08-06 LAB — CA 125: Cancer Antigen (CA) 125: 134 U/mL — ABNORMAL HIGH (ref 0.0–38.1)

## 2020-08-06 LAB — CANCER ANTIGEN 19-9: CA 19-9: <2 U/mL (ref 0–35)

## 2020-08-06 LAB — CANCER ANTIGEN 27.29: CA 27.29: 23.2 U/mL (ref 0.0–38.6)

## 2020-08-06 SURGERY — COLONOSCOPY WITH PROPOFOL
Anesthesia: General

## 2020-08-06 NOTE — Telephone Encounter (Signed)
Notified Ms. Worst with biopsy date/time. She is aware to arrive at the medical mall entrance at 0830 for a 0930 appointment. She will also need a driver. [employer] will be calling her today with any further instructions.

## 2020-08-06 NOTE — Progress Notes (Signed)
Patient on schedule for CT Omental biopsy 08/09/2020, called and spoke with patient on phone with pre procedure instructions given. Made aware to be here @ 0830, NPO after MN, and driver post procedure/recovery/discharge. Stated understanding.

## 2020-08-09 ENCOUNTER — Inpatient Hospital Stay: Payer: Medicare Other

## 2020-08-09 ENCOUNTER — Other Ambulatory Visit: Payer: Self-pay

## 2020-08-09 ENCOUNTER — Ambulatory Visit
Admission: RE | Admit: 2020-08-09 | Discharge: 2020-08-09 | Disposition: A | Discharge status: Home / Self Care | Payer: Medicare Other | Source: Ambulatory Visit | Attending: Oncology | Admitting: Oncology

## 2020-08-09 DIAGNOSIS — C786 Secondary malignant neoplasm of retroperitoneum and peritoneum: Secondary | ICD-10-CM | POA: Diagnosis not present

## 2020-08-09 DIAGNOSIS — C482 Malignant neoplasm of peritoneum, unspecified: Secondary | ICD-10-CM | POA: Diagnosis not present

## 2020-08-09 LAB — CBC
HCT: 36.5 % (ref 36.0–46.0)
Hemoglobin: 11.5 g/dL — ABNORMAL LOW (ref 12.0–15.0)
MCH: 25.3 pg — ABNORMAL LOW (ref 26.0–34.0)
MCHC: 31.5 g/dL (ref 30.0–36.0)
MCV: 80.4 fL (ref 80.0–100.0)
Platelets: 555 10*3/uL — ABNORMAL HIGH (ref 150–400)
RBC: 4.54 MIL/uL (ref 3.87–5.11)
RDW: 14.5 % (ref 11.5–15.5)
WBC: 9.3 10*3/uL (ref 4.0–10.5)
nRBC: 0 % (ref 0.0–0.2)

## 2020-08-09 LAB — PROTIME-INR
INR: 1.1 (ref 0.8–1.2)
Prothrombin Time: 14.1 seconds (ref 11.4–15.2)

## 2020-08-09 MED ORDER — FENTANYL CITRATE (PF) 100 MCG/2ML IJ SOLN
INTRAMUSCULAR | Status: AC
  Filled 2020-08-09: qty 2

## 2020-08-09 MED ORDER — MIDAZOLAM HCL 2 MG/2ML IJ SOLN
INTRAMUSCULAR | Status: AC | PRN
  Administered 2020-08-09: 1 mg via INTRAVENOUS

## 2020-08-09 MED ORDER — FENTANYL CITRATE (PF) 100 MCG/2ML IJ SOLN
INTRAMUSCULAR | Status: AC | PRN
  Administered 2020-08-09 (×2): 50 ug via INTRAVENOUS

## 2020-08-09 MED ORDER — SODIUM CHLORIDE 0.9 % IV SOLN: INTRAVENOUS | Status: DC

## 2020-08-09 MED ORDER — MIDAZOLAM HCL 2 MG/2ML IJ SOLN
INTRAMUSCULAR | Status: AC
  Filled 2020-08-09: qty 2

## 2020-08-09 NOTE — Discharge Instructions (Signed)
Needle Biopsy, Care After These instructions tell you how to care for yourself after your procedure. Your doctor may also give you more specific instructions. Call your doctor if you have any problems or questions. What can I expect after the procedure? After the procedure, it is common to have:  Soreness.  Bruising.  Mild pain. Follow these instructions at home:  Return to your normal activities as told by your doctor. Ask your doctor what activities are safe for you.  Take over-the-counter and prescription medicines only as told by your doctor.  Wash your hands with soap and water before you change your bandage (dressing). If you cannot use soap and water, use hand sanitizer.  Follow instructions from your doctor about: ? How to take care of your puncture site. ? When and how to change your bandage. ? When to remove your bandage.  Check your puncture site every day for signs of infection. Watch for: ? Redness, swelling, or pain. ? Fluid or blood. ? Pus or a bad smell. ? Warmth.  Do not take baths, swim, or use a hot tub until your doctor approves. Ask your doctor if you may take showers. You may only be allowed to take sponge baths.  Keep all follow-up visits as told by your doctor. This is important.   Contact a doctor if you have:  A fever.  Redness, swelling, or pain at the puncture site, and it lasts longer than a few days.  Fluid, blood, or pus coming from the puncture site.  Warmth coming from the puncture site. Get help right away if:  You have a lot of bleeding from the puncture site. Summary  After the procedure, it is common to have soreness, bruising, or mild pain at the puncture site.  Check your puncture site every day for signs of infection, such as redness, swelling, or pain.  Get help right away if you have severe bleeding from your puncture site. This information is not intended to replace advice given to you by your health care provider. Make  sure you discuss any questions you have with your health care provider. Document Revised: 09/04/2019 Document Reviewed: 09/04/2019 Elsevier Patient Education  2021 [employer]. Moderate Conscious Sedation, Adult, Care After This sheet gives you information about how to care for yourself after your procedure. Your health care provider may also give you more specific instructions. If you have problems or questions, contact your health care provider. What can I expect after the procedure? After the procedure, it is common to have:  Sleepiness for several hours.  Impaired judgment for several hours.  Difficulty with balance.  Vomiting if you eat too soon. Follow these instructions at home: For the time period you were told by your health care provider:  Rest.  Do not participate in activities where you could fall or become injured.  Do not drive or use machinery.  Do not drink alcohol.  Do not take sleeping pills or medicines that cause drowsiness.  Do not make important decisions or sign legal documents.  Do not take care of children on your own.      Eating and drinking  Follow the diet recommended by your health care provider.  Drink enough fluid to keep your urine pale yellow.  If you vomit: ? Drink water, juice, or soup when you can drink without vomiting. ? Make sure you have little or no nausea before eating solid foods.   General instructions  Take over-the-counter and prescription medicines only as   told by your health care provider.  Have a responsible adult stay with you for the time you are told. It is important to have someone help care for you until you are awake and alert.  Do not smoke.  Keep all follow-up visits as told by your health care provider. This is important. Contact a health care provider if:  You are still sleepy or having trouble with balance after 24 hours.  You feel light-headed.  You keep feeling nauseous or you keep  vomiting.  You develop a rash.  You have a fever.  You have redness or swelling around the IV site. Get help right away if:  You have trouble breathing.  You have new-onset confusion at home. Summary  After the procedure, it is common to feel sleepy, have impaired judgment, or feel nauseous if you eat too soon.  Rest after you get home. Know the things you should not do after the procedure.  Follow the diet recommended by your health care provider and drink enough fluid to keep your urine pale yellow.  Get help right away if you have trouble breathing or new-onset confusion at home. This information is not intended to replace advice given to you by your health care provider. Make sure you discuss any questions you have with your health care provider. Document Revised: 07/04/2019 Document Reviewed: 01/30/2019 Elsevier Patient Education  2021 Elsevier Inc.  

## 2020-08-09 NOTE — H&P (Signed)
Chief Complaint: Abdominal mass. Request is for peritoneal biopsy  Referring Physician(s): Finnegan,Timothy J  Supervising Physician: Markus Daft  Patient Status:   History of Present Illness: Judith Ross is a 65 y.o. female  outpatient. History of HTN, HLD,  surgical history includes total hysterectomy in 2011 Patient reporting abdominal pain X 2 months with weight loss. 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 6 cm central pelvic soft tissue mass in hysterectomy bed, consistent with recurrent/metastatic carcinoma. Short segment luminal narrowing and wall thickening involving the transverse colon. This could represent a primary colon carcinoma, versus extrinsic narrowing of the colon due to adjacent omental carcinomatosis. Team is requesting a peritoneal biopsy for further evaluation of peritoneal carcinomatosis.  Patient alert and laying in bed, calm and comfortable. Endorses abdominal pain. Denies any fevers, headache, chest pain, SOB, cough, nausea, vomiting or bleeding. Return precautions and treatment recommendations and follow-up discussed with the patient who is agreeable with the plan.   Past Medical History:  Diagnosis Date  . Arthritis   . Chicken pox   . Hyperlipidemia   . Hypertension   . Obesity   . Sleep apnea   . Thyroid disease     Past Surgical History:  Procedure Laterality Date  . ABDOMINAL HYSTERECTOMY  12/2009   total  . supracervical abdominal hysterectomy and bilateral salpingo--oophorectomy 01-17-2010 for fibroids  01/17/2010  . TOTAL THYROIDECTOMY  1991-92  . TUBAL LIGATION      Allergies: Erythromycin  Medications: Prior to Admission medications   Medication Sig Start Date End Date Taking? Authorizing Provider  atorvastatin (LIPITOR) 10 MG tablet TAKE 1 TABLET(10 MG) BY MOUTH DAILY 07/30/20  Yes Dutch Quint B, FNP  benazepril (LOTENSIN) 20 MG tablet TAKE 1  TABLET BY MOUTH DAILY 05/11/20  Yes Baity, Coralie Keens, NP  clobetasol ointment (TEMOVATE) 0.05 % Apply to affected area every night for 4 weeks, then every other day for 4 weeks and then twice a week for 4 weeks or until resolution. 05/14/17  Yes [name], MD  dicyclomine (BENTYL) 10 MG capsule Take 1 capsule (10 mg total) by mouth in the morning, at noon, in the evening, and at bedtime. 07/20/20  Yes Jonathon Bellows, MD  hydrochlorothiazide (HYDRODIURIL) 25 MG tablet Take 1 tablet (25 mg total) by mouth daily. 08/13/19  Yes Jearld Fenton, NP  levothyroxine (SYNTHROID) 150 MCG tablet Take 1 tablet (150 mcg total) by mouth daily. 04/30/20  Yes Jearld Fenton, NP     Family History  Problem Relation Age of Onset  . Stroke Father   . Diabetes Maternal Grandmother   . Hypertension Maternal Grandmother   . Diabetes Maternal Uncle   . Cancer Maternal Aunt        Breast    Social History   Socioeconomic History  . Marital status: Divorced    Spouse name: Not on file  . Number of children: Not on file  . Years of education: Not on file  . Highest education level: Not on file  Occupational History  . Not on file  Tobacco Use  . Smoking status: Never Smoker  . Smokeless tobacco: Never Used  Substance and Sexual Activity  . Alcohol use: No    Alcohol/week: 0.0 standard drinks  . Drug use: No  . Sexual activity: Yes  Other Topics Concern  . Not on file  Social History Narrative  . Not on file   Social Determinants of  Health   Financial Resource Strain: Not on file  Food Insecurity: Not on file  Transportation Needs: Not on file  Physical Activity: Not on file  Stress: Not on file  Social Connections: Not on file    Review of Systems: A 12 point ROS discussed and pertinent positives are indicated in the HPI above.  All other systems are negative.  Review of Systems  Constitutional: Negative for fatigue and fever.  HENT: Negative for congestion.   Respiratory: Negative for  cough and shortness of breath.   Gastrointestinal: Positive for abdominal pain. Negative for diarrhea, nausea and vomiting.    Vital Signs: BP (!) 149/85   Pulse 82   Temp 98.4 F (36.9 C)   Resp (!) 23   Ht 5\' 6"  (1.676 m)   Wt 252 lb (114.3 kg)   SpO2 99%   BMI 40.67 kg/m   Physical Exam Vitals and nursing note reviewed.  Constitutional:      Appearance: She is well-developed.  HENT:     Head: Normocephalic and atraumatic.  Eyes:     Conjunctiva/sclera: Conjunctivae normal.  Cardiovascular:     Rate and Rhythm: Normal rate and regular rhythm.     Heart sounds: Normal heart sounds.  Pulmonary:     Effort: Pulmonary effort is normal.     Breath sounds: Normal breath sounds.  Musculoskeletal:        General: Normal range of motion.     Cervical back: Normal range of motion.  Skin:    General: Skin is warm.  Neurological:     Mental Status: She is alert and oriented to person, place, and time.     Imaging: CT Abdomen Pelvis W Contrast  Result Date: 08/03/2020 CLINICAL DATA:  Mid and lower abdominal pain for 2 months. Early satiety. EXAM: CT ABDOMEN AND PELVIS WITH CONTRAST TECHNIQUE: Multidetector CT imaging of the abdomen and pelvis was performed using the standard protocol following bolus administration of intravenous contrast. CONTRAST:  65mL OMNIPAQUE IOHEXOL 300 MG/ML  SOLN COMPARISON:  None. FINDINGS: Lower Chest: Tiny right pleural effusion noted. Hepatobiliary: No hepatic masses identified. Gallbladder is unremarkable. No evidence of biliary ductal dilatation. Mild diffuse peritoneal thickening or minimal ascites throughout the perihepatic space. Pancreas:  No mass or inflammatory changes. Spleen: Within normal limits in size and appearance. Adrenals/Urinary Tract: No masses identified. No evidence of ureteral calculi or hydronephrosis. Stomach/Bowel: Short segment area of luminal narrowing and wall thickening is seen involving the transverse colon with adjacent  omental carcinomatosis. This could represent the site of a primary colon carcinoma, versus metastatic disease causing extrinsic narrowing of the colon. Vascular/Lymphatic: No pathologically enlarged lymph nodes. No acute vascular findings. Reproductive: Patient has history of previous hysterectomy. A soft tissue density is seen in the central pelvic hysterectomy bed abutting the vaginal cuff which measures 5.8 x 3.4 cm on image 80/2. Mild ascites is seen in the pelvis and bilateral paracolic gutters. Diffuse omental caking is seen as well as soft tissue stranding and nodularity throughout the peritoneal fat, consistent with peritoneal carcinomatosis. Other:  None. Musculoskeletal:  No suspicious bone lesions identified. 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 6 cm central pelvic soft tissue mass in hysterectomy bed, consistent with recurrent/metastatic carcinoma. Short segment luminal narrowing and wall thickening involving the transverse colon. This could represent a primary colon carcinoma, versus extrinsic narrowing of the colon due to adjacent omental carcinomatosis. Tiny right pleural effusion. These results will be called to the  ordering clinician or representative by the Radiologist Assistant, and communication documented in the PACS or [employer]. Electronically Signed   By: Marlaine Hind M.D.   On: 08/03/2020 09:40    Labs:  CBC: Recent Labs    04/20/20 1039 07/14/20 1121 07/30/20 1449 08/05/20 1007 08/09/20 0919  WBC 7.1 9.0  --  9.1 9.3  HGB 13.9 12.4  --  11.9* 11.5*  HCT 43.2 38.8  --  38.5 36.5  PLT 387.0 525.0* 554* 562* 555*    COAGS: No results for input(s): INR, APTT in the last 8760 hours.  BMP: Recent Labs    04/20/20 1039 07/14/20 1121 07/27/20 1415 08/05/20 1007  NA 142 139 137 140  K 4.3 4.2 4.7 4.3  CL 102 98 98 100  CO2 33* 29 30 28   GLUCOSE 93 95 98 109*  BUN 14 19 27* 17  CALCIUM 9.1 9.1 8.9  8.4*  CREATININE 0.99 1.19 1.67* 1.15*  GFRNONAA  --   --   --  53*    LIVER FUNCTION TESTS: Recent Labs    04/20/20 1039 07/14/20 1121 07/27/20 1415 08/05/20 1007  BILITOT 0.3 0.4 0.5 0.7  AST 14 13 14 18   ALT 18 10 9 12   ALKPHOS 88 87 89 85  PROT 7.1 7.8 7.1 8.1  ALBUMIN 4.0 4.0 3.9 3.5     Assessment and Plan:   66 y.o. female outpatient. History of HTN, HLD,  surgical history includes total hysterectomy in 2011 Patient reporting abdominal pain X 2 months with weight loss. 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 6 cm central pelvic soft tissue mass in hysterectomy bed, consistent with recurrent/metastatic carcinoma. Short segment luminal narrowing and wall thickening involving the transverse colon. This could represent a primary colon carcinoma, versus extrinsic narrowing of the colon due to adjacent omental carcinomatosis. Team is requesting a peritoneal biopsy for further evaluation of peritoneal carcinomatosis.  CBC from 5.23.22 unremarkable. Labs from 5.19 shows Cr 1.15. All other labs and medications are within acceptable parameters. Allergies include ?erythromycin. Patient has been NPO since midnight.  Risks and benefits of abdominal mass biopsy was discussed with the patient and/or patient's family including, but not limited to bleeding, infection, damage to adjacent structures or low yield requiring additional tests.  All of the questions were answered and there is agreement to proceed.  Consent signed and in chart.   Thank you for this interesting consult.  I greatly enjoyed meeting GELSEY AMYX and look forward to participating in their care.  A copy of this report was sent to the requesting provider on this date.  Electronically Signed: Jacqualine Mau, NP 08/09/2020, 9:45 AM   I spent a total of  30 Minutes   in face to face in clinical consultation, greater than 50% of which  was counseling/coordinating care for abdominal mass biopsy

## 2020-08-09 NOTE — Progress Notes (Signed)
Patient clinically stable post Peritoneal biopsy per Dr Anselm Pancoast, tolerated well. Denies complaints at this time.received Versed 1 mg along with Fentanyl 100 mcg IV for procedure. Denies complaints at this time. Report given to [place] Rn post procedure in specials.

## 2020-08-09 NOTE — Procedures (Signed)
Interventional Radiology Procedure:   Indications: Peritoneal carcinomatosis  Procedure: CT guided core biopsy of anterior peritoneal disease  Findings: 4 cores obtained from anterior peritoneal nodule  Complications: None     EBL: less than 10 ml  Plan: Bedrest 2 hours, then discharge to home.   Judith Ross R. Anselm Pancoast, MD  Pager: [phone]

## 2020-08-10 ENCOUNTER — Telehealth: Payer: Self-pay

## 2020-08-10 ENCOUNTER — Other Ambulatory Visit: Payer: Self-pay

## 2020-08-10 DIAGNOSIS — R1084 Generalized abdominal pain: Secondary | ICD-10-CM

## 2020-08-10 DIAGNOSIS — R718 Other abnormality of red blood cells: Secondary | ICD-10-CM

## 2020-08-10 LAB — CEA: CEA: 313 ng/mL — ABNORMAL HIGH (ref 0.0–4.7)

## 2020-08-10 MED ORDER — NA SULFATE-K SULFATE-MG SULF 17.5-3.13-1.6 GM/177ML PO SOLN: ORAL | 0 refills | Status: DC

## 2020-08-10 NOTE — Telephone Encounter (Signed)
Spoke with Judith Ross. She is feeling well after her biopsy with just some tenderness. CEA was elevated at 313. We have contacted GI and they will reach out to her to ger colonoscopy rescheduled. All questions answered.

## 2020-08-10 NOTE — Telephone Encounter (Signed)
Patient was contacted to reschedule her colonoscopy and EGD per Mariea Clonts, RN from the cancer center. Patient was given instructions and I will send her prescription to her pharmacy and instructions via MyChart per patient's request.

## 2020-08-11 LAB — SURGICAL PATHOLOGY

## 2020-08-12 NOTE — Progress Notes (Signed)
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  INTERVAL HISTORY: Patient returns to clinic today for further evaluation, discussion of her biopsy results, and treatment planning.  She continues to have intermittent, mild abdominal pain, but otherwise feels well.  She has no neurologic complaints.  She denies any recent fevers or illnesses.  She has a good appetite.  She has no chest pain, shortness of breath, cough, or hemoptysis.  She denies any nausea, vomiting, constipation, or diarrhea.  She has no melena or hematochezia.  She has no urinary complaints.  Patient offers no further specific complaints today.  REVIEW OF SYSTEMS:   Review of Systems  Constitutional: Negative.  Negative for fever, malaise/fatigue and weight loss.  Respiratory: Negative.  Negative for cough, hemoptysis and shortness of breath.   Cardiovascular: Negative.  Negative for chest pain and leg swelling.  Gastrointestinal: Positive for abdominal pain. Negative for blood in stool, constipation, diarrhea, melena, nausea and vomiting.  Genitourinary: Negative.  Negative for dysuria.  Musculoskeletal: Negative.  Negative for back pain.  Skin: Negative.  Negative for rash.  Neurological: Negative.  Negative for dizziness, focal weakness, weakness and headaches.  Psychiatric/Behavioral: Negative.  The patient is not nervous/anxious.     As per HPI. Otherwise, a complete review of systems is negative.  PAST MEDICAL HISTORY: Past Medical History:  Diagnosis Date  . Arthritis   . Chicken pox   . Hyperlipidemia   . Hypertension   . Obesity   . Sleep apnea   . Thyroid disease     PAST SURGICAL  HISTORY: Past Surgical History:  Procedure Laterality Date  . ABDOMINAL HYSTERECTOMY  12/2009   total  . COLONOSCOPY WITH PROPOFOL N/A 08/13/2020   Procedure: COLONOSCOPY WITH PROPOFOL;  Surgeon: Jonathon Bellows, MD;  Location: [facility] ENDOSCOPY;  Service: Gastroenterology;  Laterality: N/A;  . ESOPHAGOGASTRODUODENOSCOPY (EGD) WITH PROPOFOL N/A 08/13/2020   Procedure: ESOPHAGOGASTRODUODENOSCOPY (EGD) WITH PROPOFOL;  Surgeon: Jonathon Bellows, MD;  Location: [facility] ENDOSCOPY;  Service: Gastroenterology;  Laterality: N/A;  . supracervical abdominal hysterectomy and bilateral salpingo--oophorectomy 01-17-2010 for fibroids  01/17/2010  . TOTAL THYROIDECTOMY  1991-92  . TUBAL LIGATION      FAMILY HISTORY: Family History  Problem Relation Age of Onset  . Stroke Father   . Diabetes Maternal Grandmother   . Hypertension Maternal Grandmother   . Diabetes Maternal Uncle   . Cancer Maternal Aunt        Breast    ADVANCED DIRECTIVES (Y/N):  N  HEALTH MAINTENANCE: Social History   Tobacco Use  . Smoking status: Never Smoker  . Smokeless tobacco: Never Used  Vaping Use  . Vaping Use: Never used  Substance Use Topics  . Alcohol use: No    Alcohol/week: 0.0 standard drinks  . Drug use: No     Colonoscopy:  PAP:  Bone density:  Lipid panel:  Allergies  Allergen Reactions  . Erythromycin Itching    Current Outpatient Medications  Medication Sig Dispense Refill  . atorvastatin (LIPITOR) 10 MG tablet TAKE 1 TABLET(10 MG) BY MOUTH DAILY 90 tablet 0  . benazepril (LOTENSIN) 20 MG tablet TAKE 1 TABLET BY MOUTH DAILY 90 tablet 2  . clobetasol ointment (TEMOVATE) 0.05 % Apply to affected area  every night for 4 weeks, then every other day for 4 weeks and then twice a week for 4 weeks or until resolution. 30 g 5  . dicyclomine (BENTYL) 10 MG capsule Take 1 capsule (10 mg total) by mouth in the morning, at noon, in the evening, and at bedtime. 90 capsule 3  . hydrochlorothiazide (HYDRODIURIL) 25 MG  tablet Take 1 tablet (25 mg total) by mouth daily. 90 tablet 3  . levothyroxine (SYNTHROID) 150 MCG tablet Take 1 tablet (150 mcg total) by mouth daily. 90 tablet 3  . Na Sulfate-K Sulfate-Mg Sulf 17.5-3.13-1.6 GM/177ML SOLN At 5 PM the day before procedure take 1 bottle and 5 hours before procedure take 1 bottle. 354 mL 0   No current facility-administered medications for this visit.    OBJECTIVE: Vitals:   08/18/20 1010  BP: (!) 116/53  Pulse: 85  Temp: (!) 97.3 F (36.3 C)  SpO2: 98%     Body mass index is 40.48 kg/m.    ECOG FS:0 - Asymptomatic  General: Well-developed, well-nourished, no acute distress. Eyes: Pink conjunctiva, anicteric sclera. HEENT: Normocephalic, moist mucous membranes. Lungs: No audible wheezing or coughing. Heart: Regular rate and rhythm. Abdomen: Soft, nontender, no obvious distention. Musculoskeletal: No edema, cyanosis, or clubbing. Neuro: Alert, answering all questions appropriately. Cranial nerves grossly intact. Skin: No rashes or petechiae noted. Psych: Normal affect.   LAB RESULTS:  Lab Results  Component Value Date   NA 140 08/05/2020   K 4.3 08/05/2020   CL 100 08/05/2020   CO2 28 08/05/2020   GLUCOSE 109 (H) 08/05/2020   BUN 17 08/05/2020   CREATININE 1.15 (H) 08/05/2020   CALCIUM 8.4 (L) 08/05/2020   PROT 8.1 08/05/2020   ALBUMIN 3.5 08/05/2020   AST 18 08/05/2020   ALT 12 08/05/2020   ALKPHOS 85 08/05/2020   BILITOT 0.7 08/05/2020   GFRNONAA 53 (L) 08/05/2020   GFRAA  01/10/2010    >60        The eGFR has been calculated using the MDRD equation. This calculation has not been validated in all clinical situations. eGFR's persistently <60 mL/min signify possible Chronic Kidney Disease.    Lab Results  Component Value Date   WBC 9.3 08/09/2020   NEUTROABS 6.2 08/05/2020   HGB 11.5 (L) 08/09/2020   HCT 36.5 08/09/2020   MCV 80.4 08/09/2020   PLT 555 (H) 08/09/2020     STUDIES: CT Abdomen Pelvis W  Contrast  Result Date: 08/03/2020 CLINICAL DATA:  Mid and lower abdominal pain for 2 months. Early satiety. EXAM: CT ABDOMEN AND PELVIS WITH CONTRAST TECHNIQUE: Multidetector CT imaging of the abdomen and pelvis was performed using the standard protocol following bolus administration of intravenous contrast. CONTRAST:  51m OMNIPAQUE IOHEXOL 300 MG/ML  SOLN COMPARISON:  None. FINDINGS: Lower Chest: Tiny right pleural effusion noted. Hepatobiliary: No hepatic masses identified. Gallbladder is unremarkable. No evidence of biliary ductal dilatation. Mild diffuse peritoneal thickening or minimal ascites throughout the perihepatic space. Pancreas:  No mass or inflammatory changes. Spleen: Within normal limits in size and appearance. Adrenals/Urinary Tract: No masses identified. No evidence of ureteral calculi or hydronephrosis. Stomach/Bowel: Short segment area of luminal narrowing and wall thickening is seen involving the transverse colon with adjacent omental carcinomatosis. This could represent the site of a primary colon carcinoma, versus metastatic disease causing extrinsic narrowing of the colon. Vascular/Lymphatic: No pathologically enlarged lymph nodes. No acute vascular findings. Reproductive: Patient has history of previous hysterectomy. A soft tissue density is seen  in the central pelvic hysterectomy bed abutting the vaginal cuff which measures 5.8 x 3.4 cm on image 80/2. Mild ascites is seen in the pelvis and bilateral paracolic gutters. Diffuse omental caking is seen as well as soft tissue stranding and nodularity throughout the peritoneal fat, consistent with peritoneal carcinomatosis. Other:  None. Musculoskeletal:  No suspicious bone lesions identified. 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 6 cm central pelvic soft tissue mass in hysterectomy bed, consistent with recurrent/metastatic carcinoma. Short segment luminal narrowing and wall thickening  involving the transverse colon. This could represent a primary colon carcinoma, versus extrinsic narrowing of the colon due to adjacent omental carcinomatosis. Tiny right pleural effusion. These results will be called to the ordering clinician or representative by the Radiologist Assistant, and communication documented in the PACS or [employer]. Electronically Signed   By: Marlaine Hind M.D.   On: 08/03/2020 09:40   CT ABDOMINAL MASS BIOPSY  Result Date: 08/09/2020 INDICATION: 65 year old with peritoneal disease of uncertain etiology. Tissue diagnosis is needed. EXAM: CT-GUIDED CORE BIOPSY OF OMENTAL/PERITONEAL NODULARITY MEDICATIONS: Moderate sedation ANESTHESIA/SEDATION: Moderate (conscious) sedation was employed during this procedure. A total of Versed 1.0 mg and Fentanyl 100 mcg was administered intravenously. Moderate Sedation Time: 12 minutes. The patient's level of consciousness and vital signs were monitored continuously by radiology nursing throughout the procedure under my direct supervision. FLUOROSCOPY TIME:  None COMPLICATIONS: None immediate. PROCEDURE: Informed written consent was obtained from the patient after a thorough discussion of the procedural risks, benefits and alternatives. All questions were addressed. A timeout was performed prior to the initiation of the procedure. Patient was placed supine on the CT scanner. Images through the abdomen were obtained. The nodularity in the anterior upper abdomen was identified and targeted. The skin was prepped with chlorhexidine and sterile field was created. Skin and soft tissues were anesthetized using 1% lidocaine. A small incision was made. Using CT guidance, a 17 gauge coaxial needle was directed into the anterior peritoneal disease just left of midline. Needle position was confirmed within the anterior aspect of the nodularity with CT. Total of 4 core biopsies were obtained with an 18 gauge core device. Specimens placed in formalin. Needle  was removed and follow up CT images were obtained. FINDINGS: Peritoneal thickening and nodularity along the anterior abdomen, just left of midline. Needle position confirmed within the disease. Adequate core specimens were obtained. IMPRESSION: CT-guided core biopsy of the peritoneal/omental disease. Electronically Signed   By: Markus Daft M.D.   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  PLAN:    1.  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  Biopsy confirmed adenocarcinoma likely of colon origin.  Colonoscopy on Aug 13, 2020 revealed transverse colon mass that was biopsied.  Results of this biopsy are pending at time of dictation.  CEA is also elevated 323.  Awaiting molecular studies, but will proceed with treatment using FOLFOX plus Avastin.  Patient will require port and PET scan prior to initiating treatment.  Return to clinic in approximately 2 weeks to initiate cycle 1.  2.  Abdominal pain: Mild.  Patient was given a prescription for Percocet today.  I spent a total of 30 minutes reviewing chart data, face-to-face evaluation with the patient, counseling and coordination of care as detailed above.   Patient expressed understanding and was in  agreement with this plan. She also understands that She can call clinic at any time with any questions, concerns, or complaints.   Cancer Staging Adenocarcinoma of transverse colon [facility]) Staging form: Colon and Rectum - Neuroendocine Tumors, AJCC 8th Edition - Clinical stage from 08/18/2020: Stage IV (cTX, cNX, pM1b) - Signed by Lloyd Huger, MD on 08/18/2020 Stage prefix: Initial diagnosis   Lloyd Huger, MD   08/18/2020 3:45 PM

## 2020-08-13 ENCOUNTER — Ambulatory Visit
Admission: RE | Admit: 2020-08-13 | Discharge: 2020-08-13 | Disposition: A | Discharge status: Home / Self Care | Payer: Medicare Other | Attending: Gastroenterology | Admitting: Gastroenterology

## 2020-08-13 ENCOUNTER — Ambulatory Visit: Payer: Medicare Other | Admitting: Anesthesiology

## 2020-08-13 ENCOUNTER — Encounter
Admission: RE | Disposition: A | Discharge status: Home / Self Care | Payer: Self-pay | Source: Home / Self Care | Attending: Gastroenterology

## 2020-08-13 ENCOUNTER — Encounter: Payer: Self-pay | Admitting: Gastroenterology

## 2020-08-13 DIAGNOSIS — K56699 Other intestinal obstruction unspecified as to partial versus complete obstruction: Secondary | ICD-10-CM | POA: Insufficient documentation

## 2020-08-13 DIAGNOSIS — K3189 Other diseases of stomach and duodenum: Secondary | ICD-10-CM

## 2020-08-13 DIAGNOSIS — Z79890 Hormone replacement therapy (postmenopausal): Secondary | ICD-10-CM | POA: Diagnosis not present

## 2020-08-13 DIAGNOSIS — Z881 Allergy status to other antibiotic agents status: Secondary | ICD-10-CM | POA: Diagnosis not present

## 2020-08-13 DIAGNOSIS — K319 Disease of stomach and duodenum, unspecified: Secondary | ICD-10-CM | POA: Insufficient documentation

## 2020-08-13 DIAGNOSIS — C184 Malignant neoplasm of transverse colon: Secondary | ICD-10-CM | POA: Insufficient documentation

## 2020-08-13 DIAGNOSIS — Z79899 Other long term (current) drug therapy: Secondary | ICD-10-CM | POA: Diagnosis not present

## 2020-08-13 DIAGNOSIS — R933 Abnormal findings on diagnostic imaging of other parts of digestive tract: Secondary | ICD-10-CM | POA: Diagnosis not present

## 2020-08-13 DIAGNOSIS — K2950 Unspecified chronic gastritis without bleeding: Secondary | ICD-10-CM | POA: Diagnosis not present

## 2020-08-13 DIAGNOSIS — B9681 Helicobacter pylori [H. pylori] as the cause of diseases classified elsewhere: Secondary | ICD-10-CM | POA: Insufficient documentation

## 2020-08-13 DIAGNOSIS — R718 Other abnormality of red blood cells: Secondary | ICD-10-CM

## 2020-08-13 DIAGNOSIS — R1084 Generalized abdominal pain: Secondary | ICD-10-CM

## 2020-08-13 HISTORY — PX: ESOPHAGOGASTRODUODENOSCOPY (EGD) WITH PROPOFOL: SHX5813

## 2020-08-13 HISTORY — PX: COLONOSCOPY WITH PROPOFOL: SHX5780

## 2020-08-13 SURGERY — COLONOSCOPY WITH PROPOFOL
Anesthesia: General

## 2020-08-13 MED ORDER — PHENYLEPHRINE HCL (PRESSORS) 10 MG/ML IV SOLN
INTRAVENOUS | Status: DC | PRN
  Administered 2020-08-13: 100 ug via INTRAVENOUS

## 2020-08-13 MED ORDER — PROPOFOL 500 MG/50ML IV EMUL
INTRAVENOUS | Status: DC | PRN
  Administered 2020-08-13: 150 ug/kg/min via INTRAVENOUS

## 2020-08-13 MED ORDER — GLYCOPYRROLATE 0.2 MG/ML IJ SOLN
INTRAMUSCULAR | Status: DC | PRN
  Administered 2020-08-13: .2 mg via INTRAVENOUS

## 2020-08-13 MED ORDER — SODIUM CHLORIDE 0.9 % IV SOLN
INTRAVENOUS | Status: DC
  Administered 2020-08-13: 1000 mL via INTRAVENOUS

## 2020-08-13 MED ORDER — LIDOCAINE HCL (CARDIAC) PF 100 MG/5ML IV SOSY
PREFILLED_SYRINGE | INTRAVENOUS | Status: DC | PRN
  Administered 2020-08-13: 100 mg via INTRAVENOUS

## 2020-08-13 MED ORDER — PROPOFOL 10 MG/ML IV BOLUS
INTRAVENOUS | Status: DC | PRN
  Administered 2020-08-13: 50 mg via INTRAVENOUS
  Administered 2020-08-13: 30 mg via INTRAVENOUS
  Administered 2020-08-13: 10 mg via INTRAVENOUS

## 2020-08-13 NOTE — Anesthesia Preprocedure Evaluation (Signed)
Anesthesia Evaluation  Patient identified by MRN, date of birth, ID band Patient awake    Reviewed: Allergy & Precautions, NPO status , Patient's Chart, lab work & pertinent test results  Airway Mallampati: III  TM Distance: >3 FB Neck ROM: Full    Dental  (+) Edentulous Upper, Upper Dentures   Pulmonary sleep apnea and Continuous Positive Airway Pressure Ventilation ,    Pulmonary exam normal        Cardiovascular hypertension, Pt. on medications negative cardio ROS Normal cardiovascular exam     Neuro/Psych negative neurological ROS  negative psych ROS   GI/Hepatic negative GI ROS, Neg liver ROS, Bowel prep,  Endo/Other  Hypothyroidism Morbid obesity  Renal/GU negative Renal ROS  negative genitourinary   Musculoskeletal  (+) Arthritis , Osteoarthritis,    Abdominal (+) + obese,   Peds negative pediatric ROS (+)  Hematology negative hematology ROS (+)   Anesthesia Other Findings Arthritis    Chicken pox    Hyperlipidemia    Hypertension    Obesity    Sleep apnea    Thyroid disease Peritoneal Carcinomotosis       Reproductive/Obstetrics negative OB ROS                            Anesthesia Physical Anesthesia Plan  ASA: III  Anesthesia Plan: General   Post-op Pain Management:    Induction: Intravenous  PONV Risk Score and Plan: 3 and Propofol infusion and TIVA  Airway Management Planned: Natural Airway and Nasal Cannula  Additional Equipment:   Intra-op Plan:   Post-operative Plan:   Informed Consent: I have reviewed the patients History and Physical, chart, labs and discussed the procedure including the risks, benefits and alternatives for the proposed anesthesia with the patient or authorized representative who has indicated his/her understanding and acceptance.       Plan Discussed with: CRNA, Anesthesiologist and Surgeon  Anesthesia Plan Comments:          Anesthesia Quick Evaluation

## 2020-08-13 NOTE — H&P (Signed)
Judith Bellows, MD [address] [place], [place], [place], [address] [address], [place], [place], [place], [address] Phone: [phone]  Fax: [phone]  Primary Care Physician:  Venia Carbon, MD   Pre-Procedure History & Physical: HPI:  Judith Ross is a 65 y.o. female is here for an endoscopy and colonoscopy    Past Medical History:  Diagnosis Date  . Arthritis   . Chicken pox   . Hyperlipidemia   . Hypertension   . Obesity   . Sleep apnea   . Thyroid disease     Past Surgical History:  Procedure Laterality Date  . ABDOMINAL HYSTERECTOMY  12/2009   total  . supracervical abdominal hysterectomy and bilateral salpingo--oophorectomy 01-17-2010 for fibroids  01/17/2010  . TOTAL THYROIDECTOMY  1991-92  . TUBAL LIGATION      Prior to Admission medications   Medication Sig Start Date End Date Taking? Authorizing Provider  atorvastatin (LIPITOR) 10 MG tablet TAKE 1 TABLET(10 MG) BY MOUTH DAILY 07/30/20  Yes Dutch Quint B, FNP  benazepril (LOTENSIN) 20 MG tablet TAKE 1 TABLET BY MOUTH DAILY 05/11/20  Yes Baity, Coralie Keens, NP  clobetasol ointment (TEMOVATE) 0.05 % Apply to affected area every night for 4 weeks, then every other day for 4 weeks and then twice a week for 4 weeks or until resolution. 05/14/17  Yes [name], MD  dicyclomine (BENTYL) 10 MG capsule Take 1 capsule (10 mg total) by mouth in the morning, at noon, in the evening, and at bedtime. 07/20/20  Yes Judith Bellows, MD  hydrochlorothiazide (HYDRODIURIL) 25 MG tablet Take 1 tablet (25 mg total) by mouth daily. 08/13/19  Yes Jearld Fenton, NP  levothyroxine (SYNTHROID) 150 MCG tablet Take 1 tablet (150 mcg total) by mouth daily. 04/30/20  Yes Baity, Coralie Keens, NP  Na Sulfate-K Sulfate-Mg Sulf 17.5-3.13-1.6 GM/177ML SOLN At 5 PM the day before procedure take 1 bottle and 5 hours before procedure take 1 bottle. 08/10/20  Yes Judith Bellows, MD    Allergies as of 08/10/2020 - Review Complete 08/09/2020   Allergen Reaction Noted  . Erythromycin Itching 02/01/2009    Family History  Problem Relation Age of Onset  . Stroke Father   . Diabetes Maternal Grandmother   . Hypertension Maternal Grandmother   . Diabetes Maternal Uncle   . Cancer Maternal Aunt        Breast    Social History   Socioeconomic History  . Marital status: Divorced    Spouse name: Not on file  . Number of children: Not on file  . Years of education: Not on file  . Highest education level: Not on file  Occupational History  . Not on file  Tobacco Use  . Smoking status: Never Smoker  . Smokeless tobacco: Never Used  Vaping Use  . Vaping Use: Never used  Substance and Sexual Activity  . Alcohol use: No    Alcohol/week: 0.0 standard drinks  . Drug use: No  . Sexual activity: Yes  Other Topics Concern  . Not on file  Social History Narrative  . Not on file   Social Determinants of Health   Financial Resource Strain: Not on file  Food Insecurity: Not on file  Transportation Needs: Not on file  Physical Activity: Not on file  Stress: Not on file  Social Connections: Not on file  Intimate Partner Violence: Not on file    Review of Systems: See HPI, otherwise negative ROS  Physical Exam: BP (!) 154/83   Pulse (!) 113   Temp (!) 96.5 F (35.8 C) (Temporal)   Resp 16   Ht 5\' 6"  (1.676 m)   Wt 114.5 kg   SpO2 95%   BMI 40.74 kg/m  General:   Alert,  pleasant and cooperative in NAD Head:  Normocephalic and atraumatic. Neck:  Supple; no masses or thyromegaly. Lungs:  Clear throughout to auscultation, normal respiratory effort.    Heart:  +S1, +S2, Regular rate and rhythm, No edema. Abdomen:  Soft, nontender and nondistended. Normal bowel sounds, without guarding, and without rebound.   Neurologic:  Alert and  oriented x4;  grossly normal neurologically.  Impression/Plan: Judith Ross is here for an endoscopy and colonoscopy  to be performed for  evaluation of abnormal CT scan of the  abdomen     Risks, benefits, limitations, and alternatives regarding endoscopy have been reviewed with the patient.  Questions have been answered.  All parties agreeable.   Judith Bellows, MD  08/13/2020, 10:50 AM

## 2020-08-13 NOTE — Op Note (Signed)
[facility] Gastroenterology Patient Name: Judith Ross Procedure Date: 08/13/2020 10:52 AM MRN: [id number] Account #: [id number] Date of Birth: [birth year] Admit Type: Outpatient Age: 65 Room: [facility] ENDO ROOM 4 Gender: Female Note Status: Finalized Procedure:             Colonoscopy Indications:           Abnormal CT of the GI tract Providers:             Jonathon Bellows MD, MD Referring MD:          Venia Carbon (Referring MD) Medicines:             Monitored Anesthesia Care Complications:         No immediate complications. Procedure:             Pre-Anesthesia Assessment:                        - Prior to the procedure, a History and Physical was                         performed, and patient medications, allergies and                         sensitivities were reviewed. The patient's tolerance                         of previous anesthesia was reviewed.                        - The risks and benefits of the procedure and the                         sedation options and risks were discussed with the                         patient. All questions were answered and informed                         consent was obtained.                        - ASA Grade Assessment: II - A patient with mild                         systemic disease.                        After obtaining informed consent, the colonoscope was                         passed under direct vision. Throughout the procedure,                         the patient's blood pressure, pulse, and oxygen                         saturations were monitored continuously. The                         Colonoscope was introduced through the anus  with the                         intention of advancing to the cecum. The scope was                         advanced to the transverse colon before the procedure                         was aborted. Medications were given. The colonoscopy                         was performed  with ease. The patient tolerated the                         procedure well. The quality of the bowel preparation                         was good. Findings:      The perianal and digital rectal examinations were normal.      A malignant-appearing, intrinsic severe stenosis measuring 5 mm (inner       diameter) was found in the mid transverse colon and was non-traversed.       Biopsies were taken with a cold forceps for histology.      The exam was otherwise without abnormality. Impression:            - Stricture in the mid transverse colon. Biopsied.                        - The examination was otherwise normal. Recommendation:        - Discharge patient to home (with escort).                        - Resume previous diet.                        - Continue present medications.                        - Await pathology results. Procedure Code(s):     --- Professional ---                        [phone], 74, Colonoscopy, flexible; with biopsy, single                         or multiple Diagnosis Code(s):     --- Professional ---                        [phone], Other intestinal obstruction unspecified as                         to partial versus complete obstruction                        R93.3, Abnormal findings on diagnostic imaging of                         other parts of digestive tract CPT copyright 2019 American  Medical Association. All rights reserved. The codes documented in this report are preliminary and upon coder review may  be revised to meet current compliance requirements. Jonathon Bellows, MD Jonathon Bellows MD, MD 08/13/2020 11:21:51 AM This report has been signed electronically. Number of Addenda: 0 Note Initiated On: 08/13/2020 10:52 AM Scope Withdrawal Time: 0 hours 3 minutes 12 seconds  Total Procedure Duration: 0 hours 6 minutes 24 seconds  Estimated Blood Loss:  Estimated blood loss: none.      [facility]

## 2020-08-13 NOTE — Transfer of Care (Signed)
Immediate Anesthesia Transfer of Care Note  Patient: Judith Ross  Procedure(s) Performed: COLONOSCOPY WITH PROPOFOL (N/A ) ESOPHAGOGASTRODUODENOSCOPY (EGD) WITH PROPOFOL (N/A )  Patient Location: Endoscopy Unit  Anesthesia Type:General  Level of Consciousness: drowsy  Airway & Oxygen Therapy: Patient Spontanous Breathing  Post-op Assessment: Report given to RN and Post -op Vital signs reviewed and stable  Post vital signs: Reviewed and stable  Last Vitals:  Vitals Value Taken Time  BP 118/65   Temp 35.6 C 08/13/20 1124  Pulse 87 08/13/20 1124  Resp 27 08/13/20 1124  SpO2 96 % 08/13/20 1124    Last Pain:  Vitals:   08/13/20 1124  TempSrc: Temporal  PainSc: 0-No pain         Complications: No complications documented.

## 2020-08-13 NOTE — Anesthesia Procedure Notes (Signed)
Date/Time: 08/13/2020 10:58 AM Performed by: Kelton Pillar, CRNA Pre-anesthesia Checklist: Patient identified, Emergency Drugs available, Suction available and Patient being monitored Patient Re-evaluated:Patient Re-evaluated prior to induction Oxygen Delivery Method: Simple face mask Induction Type: IV induction

## 2020-08-13 NOTE — Op Note (Signed)
[facility] Patient Name: Judith Ross Procedure Date: 08/13/2020 10:53 AM MRN: [id number] Account #: [id number] Date of Birth: [birth year] Admit Type: Outpatient Age: 65 Room: [facility] ENDO ROOM 4 Gender: Female Note Status: Finalized Procedure:             Upper GI endoscopy Indications:           Abnormal CT of the GI tract Providers:             Jonathon Bellows MD, MD Referring MD:          Venia Carbon (Referring MD) Medicines:             Monitored Anesthesia Care Complications:         No immediate complications. Procedure:             Pre-Anesthesia Assessment:                        - Prior to the procedure, a History and Physical was                         performed, and patient medications, allergies and                         sensitivities were reviewed. The patient's tolerance                         of previous anesthesia was reviewed.                        - The risks and benefits of the procedure and the                         sedation options and risks were discussed with the                         patient. All questions were answered and informed                         consent was obtained.                        - ASA Grade Assessment: III - A patient with severe                         systemic disease.                        After obtaining informed consent, the endoscope was                         passed under direct vision. Throughout the procedure,                         the patient's blood pressure, pulse, and oxygen                         saturations were monitored continuously. The Endoscope                         was introduced through  the mouth, and advanced to the                         third part of duodenum. The upper GI endoscopy was                         accomplished with ease. The patient tolerated the                         procedure well. Findings:      The examined duodenum was normal.      The  esophagus was normal.      A medium-sized, polypoid, non-circumferential mass with no bleeding and       no stigmata of recent bleeding was found in the gastric antrum. Biopsies       were taken with a cold forceps for histology.      The cardia and gastric fundus were normal on retroflexion. Impression:            - Normal examined duodenum.                        - Normal esophagus.                        - Rule out malignancy, gastric tumor in the gastric                         antrum. Biopsied. Recommendation:        - Await pathology results.                        - Perform a colonoscopy today. Procedure Code(s):     --- Professional ---                        [phone], Esophagogastroduodenoscopy, flexible,                         transoral; with biopsy, single or multiple Diagnosis Code(s):     --- Professional ---                        D49.0, Neoplasm of unspecified behavior of digestive                         system                        R93.3, Abnormal findings on diagnostic imaging of                         other parts of digestive tract CPT copyright 2019 [employer]. All rights reserved. The codes documented in this report are preliminary and upon coder review may  be revised to meet current compliance requirements. Jonathon Bellows, MD Jonathon Bellows MD, MD 08/13/2020 11:11:31 AM This report has been signed electronically. Number of Addenda: 0 Note Initiated On: 08/13/2020 10:53 AM Estimated Blood Loss:  Estimated blood loss: none.      [facility]

## 2020-08-13 NOTE — Anesthesia Postprocedure Evaluation (Signed)
Anesthesia Post Note  Patient: Judith Ross  Procedure(s) Performed: COLONOSCOPY WITH PROPOFOL (N/A ) ESOPHAGOGASTRODUODENOSCOPY (EGD) WITH PROPOFOL (N/A )  Patient location during evaluation: Phase II Anesthesia Type: General Level of consciousness: awake and alert Pain management: pain level controlled Vital Signs Assessment: post-procedure vital signs reviewed and stable Respiratory status: spontaneous breathing, nonlabored ventilation, respiratory function stable and patient connected to nasal cannula oxygen Cardiovascular status: blood pressure returned to baseline and stable Postop Assessment: no apparent nausea or vomiting Anesthetic complications: no   No complications documented.   Last Vitals:  Vitals:   08/13/20 1124 08/13/20 1134  BP: 118/65 115/75  Pulse: 87 89  Resp: (!) 27 (!) 28  Temp: (!) 35.6 C   SpO2: 96% 94%    Last Pain:  Vitals:   08/13/20 1124  TempSrc: Temporal  PainSc: 0-No pain                 Phill Mutter

## 2020-08-16 ENCOUNTER — Encounter: Payer: Self-pay | Admitting: Gastroenterology

## 2020-08-18 ENCOUNTER — Other Ambulatory Visit: Payer: Self-pay

## 2020-08-18 ENCOUNTER — Encounter: Payer: Self-pay | Admitting: Oncology

## 2020-08-18 ENCOUNTER — Telehealth: Payer: Self-pay | Admitting: Internal Medicine

## 2020-08-18 ENCOUNTER — Inpatient Hospital Stay: Payer: Medicare Other | Attending: Oncology | Admitting: Oncology

## 2020-08-18 VITALS — BP 116/53 | HR 85 | Temp 97.3°F | Ht 66.0 in | Wt 250.8 lb

## 2020-08-18 DIAGNOSIS — Z79899 Other long term (current) drug therapy: Secondary | ICD-10-CM | POA: Insufficient documentation

## 2020-08-18 DIAGNOSIS — Z5111 Encounter for antineoplastic chemotherapy: Secondary | ICD-10-CM | POA: Diagnosis present

## 2020-08-18 DIAGNOSIS — D72819 Decreased white blood cell count, unspecified: Secondary | ICD-10-CM | POA: Diagnosis not present

## 2020-08-18 DIAGNOSIS — C786 Secondary malignant neoplasm of retroperitoneum and peritoneum: Secondary | ICD-10-CM

## 2020-08-18 DIAGNOSIS — C785 Secondary malignant neoplasm of large intestine and rectum: Secondary | ICD-10-CM | POA: Insufficient documentation

## 2020-08-18 DIAGNOSIS — C189 Malignant neoplasm of colon, unspecified: Secondary | ICD-10-CM

## 2020-08-18 DIAGNOSIS — C482 Malignant neoplasm of peritoneum, unspecified: Secondary | ICD-10-CM | POA: Insufficient documentation

## 2020-08-18 DIAGNOSIS — C184 Malignant neoplasm of transverse colon: Secondary | ICD-10-CM | POA: Insufficient documentation

## 2020-08-18 MED ORDER — LIDOCAINE-PRILOCAINE 2.5-2.5 % EX CREA: TOPICAL_CREAM | CUTANEOUS | 3 refills | Status: DC

## 2020-08-18 MED ORDER — OXYCODONE-ACETAMINOPHEN 5-325 MG PO TABS: 1.0000 | ORAL_TABLET | ORAL | 0 refills | Status: AC | PRN

## 2020-08-18 MED ORDER — PROCHLORPERAZINE MALEATE 10 MG PO TABS: 10.0000 mg | ORAL_TABLET | Freq: Four times a day (QID) | ORAL | 1 refills | Status: DC | PRN

## 2020-08-18 MED ORDER — ONDANSETRON HCL 8 MG PO TABS: 8.0000 mg | ORAL_TABLET | Freq: Two times a day (BID) | ORAL | 1 refills | Status: DC | PRN

## 2020-08-18 MED ORDER — LEVOTHYROXINE SODIUM 150 MCG PO TABS: 150.0000 ug | ORAL_TABLET | Freq: Every day | ORAL | 3 refills | Status: AC

## 2020-08-18 MED ORDER — HYDROCHLOROTHIAZIDE 25 MG PO TABS: 25.0000 mg | ORAL_TABLET | Freq: Every day | ORAL | 3 refills | Status: DC

## 2020-08-18 NOTE — Telephone Encounter (Signed)
Rxs sent electronically.  

## 2020-08-18 NOTE — Addendum Note (Signed)
Addended by: Pilar Grammes on: 08/18/2020 02:21 PM   Modules accepted: Orders

## 2020-08-18 NOTE — Telephone Encounter (Signed)
She is out  LAST APPOINTMENT DATE: 08/05/2020   NEXT APPOINTMENT DATE:@Visit  date not found  MEDICATION: levothyroxine, hydrochlorothiazide   PHARMACY: [facility]- [address]  Let patient know to contact pharmacy at the end of the day to make sure medication is ready.  Please notify patient to allow 48-72 hours to process  Encourage patient to contact the pharmacy for refills or they can request refills through [facility]:   LAST REFILL:  QTY:  REFILL DATE:    OTHER COMMENTS:    Okay for refill?  Please advise

## 2020-08-18 NOTE — Progress Notes (Signed)
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

## 2020-08-19 ENCOUNTER — Other Ambulatory Visit: Payer: Self-pay | Admitting: Oncology

## 2020-08-19 ENCOUNTER — Telehealth: Payer: Self-pay | Admitting: *Deleted

## 2020-08-19 LAB — SURGICAL PATHOLOGY

## 2020-08-19 NOTE — Telephone Encounter (Signed)
Patient scheduled for port placement on Friday 6/10 at 11:00. Patient [place] for appointment [place] 10:[place]. Patient verbalized understanding of all appointment details.

## 2020-08-20 NOTE — Patient Instructions (Signed)
Bevacizumab injection What is this medicine? BEVACIZUMAB (be va SIZ yoo mab) is a monoclonal antibody. It is used to treat many types of cancer. This medicine may be used for other purposes; ask your health care provider or pharmacist if you have questions. COMMON BRAND NAME(S): Avastin, MVASI, Zirabev What should I tell my health care provider before I take this medicine? They need to know if you have any of these conditions:  diabetes  heart disease  high blood pressure  history of coughing up blood  prior anthracycline chemotherapy (e.g., doxorubicin, daunorubicin, epirubicin)  recent or ongoing radiation therapy  recent or planning to have surgery  stroke  an unusual or allergic reaction to bevacizumab, hamster proteins, mouse proteins, other medicines, foods, dyes, or preservatives  pregnant or trying to get pregnant  breast-feeding How should I use this medicine? This medicine is for infusion into a vein. It is given by a health care professional in a hospital or clinic setting. Talk to your pediatrician regarding the use of this medicine in children. Special care may be needed. Overdosage: If you think you have taken too much of this medicine contact a poison control center or emergency room at once. NOTE: This medicine is only for you. Do not share this medicine with others. What if I miss a dose? It is important not to miss your dose. Call your doctor or health care professional if you are unable to keep an appointment. What may interact with this medicine? Interactions are not expected. This list may not describe all possible interactions. Give your health care provider a list of all the medicines, herbs, non-prescription drugs, or dietary supplements you use. Also tell them if you smoke, drink alcohol, or use illegal drugs. Some items may interact with your medicine. What should I watch for while using this medicine? Your condition will be monitored carefully while  you are receiving this medicine. You will need important blood work and urine testing done while you are taking this medicine. This medicine may increase your risk to bruise or bleed. Call your doctor or health care professional if you notice any unusual bleeding. Before having surgery, talk to your health care provider to make sure it is ok. This drug can increase the risk of poor healing of your surgical site or wound. You will need to stop this drug for 28 days before surgery. After surgery, wait at least 28 days before restarting this drug. Make sure the surgical site or wound is healed enough before restarting this drug. Talk to your health care provider if questions. Do not become pregnant while taking this medicine or for 6 months after stopping it. Women should inform their doctor if they wish to become pregnant or think they might be pregnant. There is a potential for serious side effects to an unborn child. Talk to your health care professional or pharmacist for more information. Do not breast-feed an infant while taking this medicine and for 6 months after the last dose. This medicine has caused ovarian failure in some women. This medicine may interfere with the ability to have a child. You should talk to your doctor or health care professional if you are concerned about your fertility. What side effects may I notice from receiving this medicine? Side effects that you should report to your doctor or health care professional as soon as possible:  allergic reactions like skin rash, itching or hives, swelling of the face, lips, or tongue  chest pain or chest tightness    chills  coughing up blood  high fever  seizures  severe constipation  signs and symptoms of bleeding such as bloody or black, tarry stools; red or dark-brown urine; spitting up blood or brown material that looks like coffee grounds; red spots on the skin; unusual bruising or bleeding from the eye, gums, or nose  signs  and symptoms of a blood clot such as breathing problems; chest pain; severe, sudden headache; pain, swelling, warmth in the leg  signs and symptoms of a stroke like changes in vision; confusion; trouble speaking or understanding; severe headaches; sudden numbness or weakness of the face, arm or leg; trouble walking; dizziness; loss of balance or coordination  stomach pain  sweating  swelling of legs or ankles  vomiting  weight gain Side effects that usually do not require medical attention (report to your doctor or health care professional if they continue or are bothersome):  back pain  changes in taste  decreased appetite  dry skin  nausea  tiredness This list may not describe all possible side effects. Call your doctor for medical advice about side effects. You may report side effects to FDA at 1-800-FDA-1088. Where should I keep my medicine? This drug is given in a hospital or clinic and will not be stored at home. NOTE: This sheet is a summary. It may not cover all possible information. If you have questions about this medicine, talk to your doctor, pharmacist, or health care provider.  2021 Elsevier/Gold Standard (2019-01-01 10:50:46) Oxaliplatin Injection What is this medicine? OXALIPLATIN (ox AL i PLA tin) is a chemotherapy drug. It targets fast dividing cells, like cancer cells, and causes these cells to die. This medicine is used to treat cancers of the colon and rectum, and many other cancers. This medicine may be used for other purposes; ask your health care provider or pharmacist if you have questions. COMMON BRAND NAME(S): Eloxatin What should I tell my health care provider before I take this medicine? They need to know if you have any of these conditions:  heart disease  history of irregular heartbeat  liver disease  low blood counts, like white cells, platelets, or red blood cells  lung or breathing disease, like asthma  take medicines that treat or  prevent blood clots  tingling of the fingers or toes, or other nerve disorder  an unusual or allergic reaction to oxaliplatin, other chemotherapy, other medicines, foods, dyes, or preservatives  pregnant or trying to get pregnant  breast-feeding How should I use this medicine? This drug is given as an infusion into a vein. It is administered in a hospital or clinic by a specially trained health care professional. Talk to your pediatrician regarding the use of this medicine in children. Special care may be needed. Overdosage: If you think you have taken too much of this medicine contact a poison control center or emergency room at once. NOTE: This medicine is only for you. Do not share this medicine with others. What if I miss a dose? It is important not to miss a dose. Call your doctor or health care professional if you are unable to keep an appointment. What may interact with this medicine? Do not take this medicine with any of the following medications:  cisapride  dronedarone  pimozide  thioridazine This medicine may also interact with the following medications:  aspirin and aspirin-like medicines  certain medicines that treat or prevent blood clots like warfarin, apixaban, dabigatran, and rivaroxaban  cisplatin  cyclosporine  diuretics  medicines  for infection like acyclovir, adefovir, amphotericin B, bacitracin, cidofovir, foscarnet, ganciclovir, gentamicin, pentamidine, vancomycin  NSAIDs, medicines for pain and inflammation, like ibuprofen or naproxen  other medicines that prolong the QT interval (an abnormal heart rhythm)  pamidronate  zoledronic acid This list may not describe all possible interactions. Give your health care provider a list of all the medicines, herbs, non-prescription drugs, or dietary supplements you use. Also tell them if you smoke, drink alcohol, or use illegal drugs. Some items may interact with your medicine. What should I watch for  while using this medicine? Your condition will be monitored carefully while you are receiving this medicine. You may need blood work done while you are taking this medicine. This medicine may make you feel generally unwell. This is not uncommon as chemotherapy can affect healthy cells as well as cancer cells. Report any side effects. Continue your course of treatment even though you feel ill unless your healthcare professional tells you to stop. This medicine can make you more sensitive to cold. Do not drink cold drinks or use ice. Cover exposed skin before coming in contact with cold temperatures or cold objects. When out in cold weather wear warm clothing and cover your mouth and nose to warm the air that goes into your lungs. Tell your doctor if you get sensitive to the cold. Do not become pregnant while taking this medicine or for 9 months after stopping it. Women should inform their health care professional if they wish to become pregnant or think they might be pregnant. Men should not father a child while taking this medicine and for 6 months after stopping it. There is potential for serious side effects to an unborn child. Talk to your health care professional for more information. Do not breast-feed a child while taking this medicine or for 3 months after stopping it. This medicine has caused ovarian failure in some women. This medicine may make it more difficult to get pregnant. Talk to your health care professional if you are concerned about your fertility. This medicine has caused decreased sperm counts in some men. This may make it more difficult to father a child. Talk to your health care professional if you are concerned about your fertility. This medicine may increase your risk of getting an infection. Call your health care professional for advice if you get a fever, chills, or sore throat, or other symptoms of a cold or flu. Do not treat yourself. Try to avoid being around people who are  sick. Avoid taking medicines that contain aspirin, acetaminophen, ibuprofen, naproxen, or ketoprofen unless instructed by your health care professional. These medicines may hide a fever. Be careful brushing or flossing your teeth or using a toothpick because you may get an infection or bleed more easily. If you have any dental work done, tell your dentist you are receiving this medicine. What side effects may I notice from receiving this medicine? Side effects that you should report to your doctor or health care professional as soon as possible:  allergic reactions like skin rash, itching or hives, swelling of the face, lips, or tongue  breathing problems  cough  low blood counts - this medicine may decrease the number of white blood cells, red blood cells, and platelets. You may be at increased risk for infections and bleeding  nausea, vomiting  pain, redness, or irritation at site where injected  pain, tingling, numbness in the hands or feet  signs and symptoms of bleeding such as bloody or black,  tarry stools; red or dark brown urine; spitting up blood or brown material that looks like coffee grounds; red spots on the skin; unusual bruising or bleeding from the eyes, gums, or nose  signs and symptoms of a dangerous change in heartbeat or heart rhythm like chest pain; dizziness; fast, irregular heartbeat; palpitations; feeling faint or lightheaded; falls  signs and symptoms of infection like fever; chills; cough; sore throat; pain or trouble passing urine  signs and symptoms of liver injury like dark yellow or brown urine; general ill feeling or flu-like symptoms; light-colored stools; loss of appetite; nausea; right upper belly pain; unusually weak or tired; yellowing of the eyes or skin  signs and symptoms of low red blood cells or anemia such as unusually weak or tired; feeling faint or lightheaded; falls  signs and symptoms of muscle injury like dark urine; trouble passing urine  or change in the amount of urine; unusually weak or tired; muscle pain; back pain Side effects that usually do not require medical attention (report to your doctor or health care professional if they continue or are bothersome):  changes in taste  diarrhea  gas  hair loss  loss of appetite  mouth sores This list may not describe all possible side effects. Call your doctor for medical advice about side effects. You may report side effects to FDA at 1-800-FDA-1088. Where should I keep my medicine? This drug is given in a hospital or clinic and will not be stored at home. NOTE: This sheet is a summary. It may not cover all possible information. If you have questions about this medicine, talk to your doctor, pharmacist, or health care provider.  2021 [employer]/Gold Standard (2018-07-24 12:20:35) Fluorouracil, 5-FU injection What is this medicine? FLUOROURACIL, 5-FU (flure oh YOOR a sil) is a chemotherapy drug. It slows the growth of cancer cells. This medicine is used to treat many types of cancer like breast cancer, colon or rectal cancer, pancreatic cancer, and stomach cancer. This medicine may be used for other purposes; ask your health care provider or pharmacist if you have questions. COMMON BRAND NAME(S): Adrucil What should I tell my health care provider before I take this medicine? They need to know if you have any of these conditions:  blood disorders  dihydropyrimidine dehydrogenase (DPD) deficiency  infection (especially a virus infection such as chickenpox, cold sores, or herpes)  kidney disease  liver disease  malnourished, poor nutrition  recent or ongoing radiation therapy  an unusual or allergic reaction to fluorouracil, other chemotherapy, other medicines, foods, dyes, or preservatives  pregnant or trying to get pregnant  breast-feeding How should I use this medicine? This drug is given as an infusion or injection into a vein. It is administered in a  hospital or clinic by a specially trained health care professional. Talk to your pediatrician regarding the use of this medicine in children. Special care may be needed. Overdosage: If you think you have taken too much of this medicine contact a poison control center or emergency room at once. NOTE: This medicine is only for you. Do not share this medicine with others. What if I miss a dose? It is important not to miss your dose. Call your doctor or health care professional if you are unable to keep an appointment. What may interact with this medicine? Do not take this medicine with any of the following medications:  live virus vaccines This medicine may also interact with the following medications:  medicines that treat or prevent blood  clots like warfarin, enoxaparin, and dalteparin This list may not describe all possible interactions. Give your health care provider a list of all the medicines, herbs, non-prescription drugs, or dietary supplements you use. Also tell them if you smoke, drink alcohol, or use illegal drugs. Some items may interact with your medicine. What should I watch for while using this medicine? Visit your doctor for checks on your progress. This drug may make you feel generally unwell. This is not uncommon, as chemotherapy can affect healthy cells as well as cancer cells. Report any side effects. Continue your course of treatment even though you feel ill unless your doctor tells you to stop. In some cases, you may be given additional medicines to help with side effects. Follow all directions for their use. Call your doctor or health care professional for advice if you get a fever, chills or sore throat, or other symptoms of a cold or flu. Do not treat yourself. This drug decreases your body's ability to fight infections. Try to avoid being around people who are sick. This medicine may increase your risk to bruise or bleed. Call your doctor or health care professional if you  notice any unusual bleeding. Be careful brushing and flossing your teeth or using a toothpick because you may get an infection or bleed more easily. If you have any dental work done, tell your dentist you are receiving this medicine. Avoid taking products that contain aspirin, acetaminophen, ibuprofen, naproxen, or ketoprofen unless instructed by your doctor. These medicines may hide a fever. Do not become pregnant while taking this medicine. Women should inform their doctor if they wish to become pregnant or think they might be pregnant. There is a potential for serious side effects to an unborn child. Talk to your health care professional or pharmacist for more information. Do not breast-feed an infant while taking this medicine. Men should inform their doctor if they wish to father a child. This medicine may lower sperm counts. Do not treat diarrhea with over the counter products. Contact your doctor if you have diarrhea that lasts more than 2 days or if it is severe and watery. This medicine can make you more sensitive to the sun. Keep out of the sun. If you cannot avoid being in the sun, wear protective clothing and use sunscreen. Do not use sun lamps or tanning beds/booths. What side effects may I notice from receiving this medicine? Side effects that you should report to your doctor or health care professional as soon as possible:  allergic reactions like skin rash, itching or hives, swelling of the face, lips, or tongue  low blood counts - this medicine may decrease the number of white blood cells, red blood cells and platelets. You may be at increased risk for infections and bleeding.  signs of infection - fever or chills, cough, sore throat, pain or difficulty passing urine  signs of decreased platelets or bleeding - bruising, pinpoint red spots on the skin, black, tarry stools, blood in the urine  signs of decreased red blood cells - unusually weak or tired, fainting spells,  lightheadedness  breathing problems  changes in vision  chest pain  mouth sores  nausea and vomiting  pain, swelling, redness at site where injected  pain, tingling, numbness in the hands or feet  redness, swelling, or sores on hands or feet  stomach pain  unusual bleeding Side effects that usually do not require medical attention (report to your doctor or health care professional if they continue  or are bothersome):  changes in finger or toe nails  diarrhea  dry or itchy skin  hair loss  headache  loss of appetite  sensitivity of eyes to the light  stomach upset  unusually teary eyes This list may not describe all possible side effects. Call your doctor for medical advice about side effects. You may report side effects to FDA at 1-800-FDA-1088. Where should I keep my medicine? This drug is given in a hospital or clinic and will not be stored at home. NOTE: This sheet is a summary. It may not cover all possible information. If you have questions about this medicine, talk to your doctor, pharmacist, or health care provider.  2021 Elsevier/Gold Standard (2019-02-04 15:00:03) Leucovorin injection What is this medicine? LEUCOVORIN (loo koe VOR in) is used to prevent or treat the harmful effects of some medicines. This medicine is used to treat anemia caused by a low amount of folic acid in the body. It is also used with 5-fluorouracil (5-FU) to treat colon cancer. This medicine may be used for other purposes; ask your health care provider or pharmacist if you have questions. What should I tell my health care provider before I take this medicine? They need to know if you have any of these conditions:  anemia from low levels of vitamin B-12 in the blood  an unusual or allergic reaction to leucovorin, folic acid, other medicines, foods, dyes, or preservatives  pregnant or trying to get pregnant  breast-feeding How should I use this medicine? This medicine is for  injection into a muscle or into a vein. It is given by a health care professional in a hospital or clinic setting. Talk to your pediatrician regarding the use of this medicine in children. Special care may be needed. Overdosage: If you think you have taken too much of this medicine contact a poison control center or emergency room at once. NOTE: This medicine is only for you. Do not share this medicine with others. What if I miss a dose? This does not apply. What may interact with this medicine?  capecitabine  fluorouracil  phenobarbital  phenytoin  primidone  trimethoprim-sulfamethoxazole This list may not describe all possible interactions. Give your health care provider a list of all the medicines, herbs, non-prescription drugs, or dietary supplements you use. Also tell them if you smoke, drink alcohol, or use illegal drugs. Some items may interact with your medicine. What should I watch for while using this medicine? Your condition will be monitored carefully while you are receiving this medicine. This medicine may increase the side effects of 5-fluorouracil, 5-FU. Tell your doctor or health care professional if you have diarrhea or mouth sores that do not get better or that get worse. What side effects may I notice from receiving this medicine? Side effects that you should report to your doctor or health care professional as soon as possible:  allergic reactions like skin rash, itching or hives, swelling of the face, lips, or tongue  breathing problems  fever, infection  mouth sores  unusual bleeding or bruising  unusually weak or tired Side effects that usually do not require medical attention (report to your doctor or health care professional if they continue or are bothersome):  constipation or diarrhea  loss of appetite  nausea, vomiting This list may not describe all possible side effects. Call your doctor for medical advice about side effects. You may report  side effects to FDA at 1-800-FDA-1088. Where should I keep my medicine? This  drug is given in a hospital or clinic and will not be stored at home. NOTE: This sheet is a summary. It may not cover all possible information. If you have questions about this medicine, talk to your doctor, pharmacist, or health care provider.  2021 Elsevier/Gold Standard (2007-09-10 16:50:29)

## 2020-08-23 ENCOUNTER — Other Ambulatory Visit: Payer: Self-pay

## 2020-08-23 ENCOUNTER — Inpatient Hospital Stay: Payer: Medicare Other

## 2020-08-25 ENCOUNTER — Other Ambulatory Visit: Payer: Self-pay

## 2020-08-25 ENCOUNTER — Encounter
Admission: RE | Admit: 2020-08-25 | Discharge: 2020-08-25 | Disposition: A | Discharge status: Home / Self Care | Payer: Medicare Other | Source: Ambulatory Visit | Attending: Oncology | Admitting: Oncology

## 2020-08-25 DIAGNOSIS — Z79899 Other long term (current) drug therapy: Secondary | ICD-10-CM | POA: Insufficient documentation

## 2020-08-25 DIAGNOSIS — C189 Malignant neoplasm of colon, unspecified: Secondary | ICD-10-CM | POA: Diagnosis not present

## 2020-08-25 LAB — GLUCOSE, CAPILLARY: Glucose-Capillary: 101 mg/dL — ABNORMAL HIGH (ref 70–99)

## 2020-08-25 MED ORDER — FLUDEOXYGLUCOSE F - 18 (FDG) INJECTION
13.0000 | Freq: Once | INTRAVENOUS | Status: AC | PRN
  Administered 2020-08-25: 13.1 via INTRAVENOUS

## 2020-08-26 ENCOUNTER — Other Ambulatory Visit: Payer: Self-pay | Admitting: Student

## 2020-08-26 NOTE — Progress Notes (Signed)
Patient on schedule for Port placement 08/27/2020, called and spoke with patient on phone with pre procedure instructions given. Made aware to be here @ 1000, NPO after MN tonight and driver post procedure/recovery/discharge. Stated understanding.

## 2020-08-27 ENCOUNTER — Ambulatory Visit
Admission: RE | Admit: 2020-08-27 | Discharge: 2020-08-27 | Disposition: A | Discharge status: Home / Self Care | Payer: Medicare Other | Source: Ambulatory Visit | Attending: Oncology | Admitting: Oncology

## 2020-08-27 ENCOUNTER — Other Ambulatory Visit: Payer: Self-pay

## 2020-08-27 DIAGNOSIS — Z79899 Other long term (current) drug therapy: Secondary | ICD-10-CM | POA: Diagnosis not present

## 2020-08-27 DIAGNOSIS — I10 Essential (primary) hypertension: Secondary | ICD-10-CM | POA: Diagnosis not present

## 2020-08-27 DIAGNOSIS — Z79890 Hormone replacement therapy (postmenopausal): Secondary | ICD-10-CM | POA: Diagnosis not present

## 2020-08-27 DIAGNOSIS — E785 Hyperlipidemia, unspecified: Secondary | ICD-10-CM | POA: Insufficient documentation

## 2020-08-27 DIAGNOSIS — G4730 Sleep apnea, unspecified: Secondary | ICD-10-CM | POA: Insufficient documentation

## 2020-08-27 DIAGNOSIS — C184 Malignant neoplasm of transverse colon: Secondary | ICD-10-CM | POA: Diagnosis not present

## 2020-08-27 DIAGNOSIS — Z881 Allergy status to other antibiotic agents status: Secondary | ICD-10-CM | POA: Insufficient documentation

## 2020-08-27 DIAGNOSIS — C189 Malignant neoplasm of colon, unspecified: Secondary | ICD-10-CM

## 2020-08-27 HISTORY — PX: IR IMAGING GUIDED PORT INSERTION: IMG5740

## 2020-08-27 MED ORDER — SODIUM CHLORIDE 0.9 % IV SOLN: INTRAVENOUS | Status: DC

## 2020-08-27 MED ORDER — FENTANYL CITRATE (PF) 100 MCG/2ML IJ SOLN
INTRAMUSCULAR | Status: AC
  Filled 2020-08-27: qty 2

## 2020-08-27 MED ORDER — FENTANYL CITRATE (PF) 100 MCG/2ML IJ SOLN
INTRAMUSCULAR | Status: AC | PRN
  Administered 2020-08-27 (×2): 50 ug via INTRAVENOUS

## 2020-08-27 MED ORDER — MIDAZOLAM HCL 2 MG/2ML IJ SOLN
INTRAMUSCULAR | Status: AC | PRN
  Administered 2020-08-27 (×2): 1 mg via INTRAVENOUS

## 2020-08-27 MED ORDER — HEPARIN (PORCINE) IN NACL 1000-0.9 UT/500ML-% IV SOLN
INTRAVENOUS | Status: AC
  Filled 2020-08-27: qty 500

## 2020-08-27 MED ORDER — MIDAZOLAM HCL 2 MG/2ML IJ SOLN
INTRAMUSCULAR | Status: AC
  Filled 2020-08-27: qty 2

## 2020-08-27 NOTE — Progress Notes (Signed)
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  INTERVAL HISTORY: Patient returns to clinic today for further evaluation and to initiate cycle 1 of FOLFOX plus Avastin.  She is anxious, but otherwise feels well.  She does not complain of abdominal pain today.  She has no neurologic complaints.  She denies any recent fevers or illnesses.  She has a good appetite.  She has no chest pain, shortness of breath, cough, or hemoptysis.  She denies any nausea, vomiting, constipation, or diarrhea.  She has no melena or hematochezia.  She has no urinary complaints.  Patient offers no further specific complaints today.    REVIEW OF SYSTEMS:   Review of Systems  Constitutional: Negative.  Negative for fever, malaise/fatigue and weight loss.  Respiratory: Negative.  Negative for cough, hemoptysis and shortness of breath.   Cardiovascular: Negative.  Negative for chest pain and leg swelling.  Gastrointestinal: Negative.  Negative for abdominal pain, blood in stool, constipation, diarrhea, melena, nausea and vomiting.  Genitourinary: Negative.  Negative for dysuria.  Musculoskeletal: Negative.  Negative for back pain.  Skin: Negative.  Negative for rash.  Neurological: Negative.  Negative for dizziness, focal weakness, weakness and headaches.  Psychiatric/Behavioral:  The patient is nervous/anxious.    As per HPI. Otherwise, a complete review of systems is negative.  PAST MEDICAL HISTORY: Past Medical History:  Diagnosis Date   Arthritis    Chicken pox    Hyperlipidemia    Hypertension    Obesity    Sleep apnea    Thyroid disease     PAST SURGICAL HISTORY: Past Surgical History:   Procedure Laterality Date   ABDOMINAL HYSTERECTOMY  12/2009   total   COLONOSCOPY WITH PROPOFOL N/A 08/13/2020   Procedure: COLONOSCOPY WITH PROPOFOL;  Surgeon: Jonathon Bellows, MD;  Location: [facility] ENDOSCOPY;  Service: Gastroenterology;  Laterality: N/A;   ESOPHAGOGASTRODUODENOSCOPY (EGD) WITH PROPOFOL N/A 08/13/2020   Procedure: ESOPHAGOGASTRODUODENOSCOPY (EGD) WITH PROPOFOL;  Surgeon: Jonathon Bellows, MD;  Location: [facility] ENDOSCOPY;  Service: Gastroenterology;  Laterality: N/A;   IR IMAGING GUIDED PORT INSERTION  08/27/2020   supracervical abdominal hysterectomy and bilateral salpingo--oophorectomy 01-17-2010 for fibroids  01/17/2010   TOTAL THYROIDECTOMY  1991-92   TUBAL LIGATION      FAMILY HISTORY: Family History  Problem Relation Age of Onset   Stroke Father    Diabetes Maternal Grandmother    Hypertension Maternal Grandmother    Diabetes Maternal Uncle    Cancer Maternal Aunt        Breast    ADVANCED DIRECTIVES (Y/N):  N  HEALTH MAINTENANCE: Social History   Tobacco Use   Smoking status: Never   Smokeless tobacco: Never  Vaping Use   Vaping Use: Never used  Substance Use Topics   Alcohol use: No    Alcohol/week: 0.0 standard drinks   Drug use: No     Colonoscopy:  PAP:  Bone density:  Lipid panel:  Allergies  Allergen Reactions   Erythromycin Itching    Current Outpatient Medications  Medication Sig Dispense Refill   atorvastatin (LIPITOR) 10 MG tablet TAKE 1 TABLET(10 MG) BY MOUTH DAILY 90 tablet 0   benazepril (LOTENSIN) 20 MG tablet TAKE 1 TABLET BY MOUTH DAILY 90 tablet 2  clobetasol ointment (TEMOVATE) 0.05 % Apply to affected area every night for 4 weeks, then every other day for 4 weeks and then twice a week for 4 weeks or until resolution. 30 g 5   hydrochlorothiazide (HYDRODIURIL) 25 MG tablet Take 1 tablet (25 mg total) by mouth daily. 90 tablet 3   levothyroxine (SYNTHROID) 150 MCG tablet Take 1 tablet (150 mcg total) by mouth daily. 90 tablet 3    lidocaine-prilocaine (EMLA) cream Apply to affected area once 30 g 3   oxyCODONE-acetaminophen (PERCOCET/ROXICET) 5-325 MG tablet Take 1 tablet by mouth every 4 (four) hours as needed for severe pain. 30 tablet 0   dicyclomine (BENTYL) 10 MG capsule Take 1 capsule (10 mg total) by mouth in the morning, at noon, in the evening, and at bedtime. (Patient not taking: Reported on 08/31/2020) 90 capsule 3   Na Sulfate-K Sulfate-Mg Sulf 17.5-3.13-1.6 GM/177ML SOLN At 5 PM the day before procedure take 1 bottle and 5 hours before procedure take 1 bottle. (Patient not taking: No sig reported) 354 mL 0   ondansetron (ZOFRAN) 8 MG tablet Take 1 tablet (8 mg total) by mouth 2 (two) times daily as needed for refractory nausea / vomiting. (Patient not taking: No sig reported) 60 tablet 1   prochlorperazine (COMPAZINE) 10 MG tablet Take 1 tablet (10 mg total) by mouth every 6 (six) hours as needed (Nausea or vomiting). (Patient not taking: No sig reported) 60 tablet 1   No current facility-administered medications for this visit.   Facility-Administered Medications Ordered in Other Visits  Medication Dose Route Frequency Provider Last Rate Last Admin   bevacizumab-bvzr (ZIRABEV) 600 mg in sodium chloride 0.9 % 100 mL chemo infusion  5 mg/kg (Treatment Plan Recorded) Intravenous Once Lloyd Huger, MD       fluorouracil (ADRUCIL) 5,500 mg in sodium chloride 0.9 % 140 mL chemo infusion  2,400 mg/m2 (Treatment Plan Recorded) Intravenous 1 day or 1 dose Lloyd Huger, MD       fluorouracil (ADRUCIL) chemo injection 900 mg  400 mg/m2 (Treatment Plan Recorded) Intravenous Once Lloyd Huger, MD       sodium chloride flush (NS) 0.9 % injection 10 mL  10 mL Intravenous PRN Lloyd Huger, MD   10 mL at 08/31/20 0911    OBJECTIVE: Vitals:   08/31/20 0940  BP: 115/75  Pulse: (!) 101  Resp: 16  Temp: (!) 97.5 F (36.4 C)  SpO2: 98%     Body mass index is 39.06 kg/m.    ECOG FS:0 -  Asymptomatic  General: Well-developed, well-nourished, no acute distress. Eyes: Pink conjunctiva, anicteric sclera. HEENT: Normocephalic, moist mucous membranes. Lungs: No audible wheezing or coughing. Heart: Regular rate and rhythm. Abdomen: Soft, nontender, no obvious distention. Musculoskeletal: No edema, cyanosis, or clubbing. Neuro: Alert, answering all questions appropriately. Cranial nerves grossly intact. Skin: No rashes or petechiae noted. Psych: Normal affect.   LAB RESULTS:  Lab Results  Component Value Date   NA 136 08/31/2020   K 3.4 (L) 08/31/2020   CL 93 (L) 08/31/2020   CO2 30 08/31/2020   GLUCOSE 114 (H) 08/31/2020   BUN 19 08/31/2020   CREATININE 1.40 (H) 08/31/2020   CALCIUM 8.5 (L) 08/31/2020   PROT 8.2 (H) 08/31/2020   ALBUMIN 3.4 (L) 08/31/2020   AST 19 08/31/2020   ALT 10 08/31/2020   ALKPHOS 77 08/31/2020   BILITOT 0.8 08/31/2020   GFRNONAA 42 (L) 08/31/2020   GFRAA  01/10/2010    >  60        The eGFR has been calculated using the MDRD equation. This calculation has not been validated in all clinical situations. eGFR's persistently <60 mL/min signify possible Chronic Kidney Disease.    Lab Results  Component Value Date   WBC 9.9 08/31/2020   NEUTROABS 7.0 08/31/2020   HGB 11.9 (L) 08/31/2020   HCT 37.6 08/31/2020   MCV 77.8 (L) 08/31/2020   PLT 523 (H) 08/31/2020     STUDIES: CT Abdomen Pelvis W Contrast  Result Date: 08/03/2020 CLINICAL DATA:  Mid and lower abdominal pain for 2 months. Early satiety. EXAM: CT ABDOMEN AND PELVIS WITH CONTRAST TECHNIQUE: Multidetector CT imaging of the abdomen and pelvis was performed using the standard protocol following bolus administration of intravenous contrast. CONTRAST:  14mL OMNIPAQUE IOHEXOL 300 MG/ML  SOLN COMPARISON:  None. FINDINGS: Lower Chest: Tiny right pleural effusion noted. Hepatobiliary: No hepatic masses identified. Gallbladder is unremarkable. No evidence of biliary ductal  dilatation. Mild diffuse peritoneal thickening or minimal ascites throughout the perihepatic space. Pancreas:  No mass or inflammatory changes. Spleen: Within normal limits in size and appearance. Adrenals/Urinary Tract: No masses identified. No evidence of ureteral calculi or hydronephrosis. Stomach/Bowel: Short segment area of luminal narrowing and wall thickening is seen involving the transverse colon with adjacent omental carcinomatosis. This could represent the site of a primary colon carcinoma, versus metastatic disease causing extrinsic narrowing of the colon. Vascular/Lymphatic: No pathologically enlarged lymph nodes. No acute vascular findings. Reproductive: Patient has history of previous hysterectomy. A soft tissue density is seen in the central pelvic hysterectomy bed abutting the vaginal cuff which measures 5.8 x 3.4 cm on image 80/2. Mild ascites is seen in the pelvis and bilateral paracolic gutters. Diffuse omental caking is seen as well as soft tissue stranding and nodularity throughout the peritoneal fat, consistent with peritoneal carcinomatosis. Other:  None. Musculoskeletal:  No suspicious bone lesions identified. 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 6 cm central pelvic soft tissue mass in hysterectomy bed, consistent with recurrent/metastatic carcinoma. Short segment luminal narrowing and wall thickening involving the transverse colon. This could represent a primary colon carcinoma, versus extrinsic narrowing of the colon due to adjacent omental carcinomatosis. Tiny right pleural effusion. These results will be called to the ordering clinician or representative by the Radiologist Assistant, and communication documented in the PACS or [employer]. Electronically Signed   By: Marlaine Hind M.D.   On: 08/03/2020 09:40   NM PET Image Initial (PI) Skull Base To Thigh  Result Date: 08/25/2020 CLINICAL DATA:  Subsequent treatment  strategy for colorectal carcinoma. EXAM: NUCLEAR MEDICINE PET SKULL BASE TO THIGH TECHNIQUE: 13.1 mCi F-18 FDG was injected intravenously. Full-ring PET imaging was performed from the skull base to thigh after the radiotracer. CT data was obtained and used for attenuation correction and anatomic localization. Fasting blood glucose: 101 mg/dl COMPARISON:  CT 08/02/2020 FINDINGS: Mediastinal blood pool activity: SUV max 2.9 Liver activity: SUV max NA NECK: No hypermetabolic lymph nodes in the neck. Incidental CT findings: none CHEST: Large layering LEFT pleural effusion is new from comparison exam. No hypermetabolic mediastinal lymph nodes. No suspicious pulmonary nodules. Incidental CT findings: There is subareolar mass in the LEFT breast measuring 1.9 cm without metabolic activity. A similar mass in the RIGHT breast measuring 3.7 cm ABDOMEN/PELVIS: peritoneal thickening in the lesser omentum measures 4.4 by 3.0 cm on image 162 with intense metabolic activity (SUV max equal 15). The hypermetabolic activity extends  extends to involve a 7 cm segment of transverse colon which is also hypermetabolic with SUV max equal 20.7 (image 168) There is a rim metabolic activity surrounding the RIGHT hepatic lobe consistent with peritoneal metastasis (SUV max equal 6.2). Scattered omental metastasis along the ventral peritoneal surface. For example midline the level the umbilicus with SUV max equal 12.6. There is heterogeneous fat stranding at this level (image 189). There is hypermetabolic activity along the peritoneal reflections in the deep pelvis associated the small free fluid with SUV max equal 10.9. Lobular mass in the deep pelvis posterior to the bladder without significant metabolic activity is favored the uterus (4.5 cm image 225) Incidental CT findings: No bowel obstruction. No intraperitoneal free air SKELETON: No focal hypermetabolic activity to suggest skeletal metastasis. Incidental CT findings: none IMPRESSION: 1.  Extensive omental and peritoneal hypermetabolic metastasis in the abdomen pelvis. 2. Hypermetabolic nodular thickening in the greater and lesser omentum. 3. Hypermetabolic circumferential thickening through the mid transverse colon. 4. Rim of hypointensity surrounding the liver as well as the deep peritoneal pelvis. 5. Mass within the deep pelvis is favor the uterus. 6. New large LEFT pleural effusion. No evidence thoracic metastasis. 7. Soft tissue densities in the subareolar space on LEFT and RIGHT are favored benign breast lesions. Electronically Signed   By: Suzy Bouchard M.D.   On: 08/25/2020 17:04   IR IMAGING GUIDED PORT INSERTION  Result Date: 08/27/2020 INDICATION: Metastatic colorectal malignancy EXAM: IMPLANTED PORT A CATH PLACEMENT WITH ULTRASOUND AND FLUOROSCOPIC GUIDANCE MEDICATIONS: None ANESTHESIA/SEDATION: Moderate (conscious) sedation was employed during this procedure. A total of Versed 2 mg and Fentanyl 100 mcg was administered intravenously. Moderate Sedation Time: 15 minutes. The patient's level of consciousness and vital signs were monitored continuously by radiology nursing throughout the procedure under my direct supervision. FLUOROSCOPY TIME:  0.3 minutes, (8.01 mGy) COMPLICATIONS: None immediate. PROCEDURE: The procedure, risks, benefits, and alternatives were explained to the patient. Questions regarding the procedure were encouraged and answered. The patient understands and consents to the procedure. A timeout was performed prior to the initiation of the procedure. Patient positioned supine on the angiography table. Right neck and anterior upper chest prepped and draped in the usual sterile fashion. All elements of maximal sterile barrier were utilized including, cap, mask, sterile gown, sterile gloves, large sterile drape, hand scrubbing and 2% Chlorhexidine for skin cleaning. The right internal jugular vein was evaluated with ultrasound and shown to be patent. A permanent  ultrasound image was obtained and placed in the patient's medical record. Local anesthesia was provided with 1% lidocaine with epinephrine. Using sterile gel and a sterile probe cover, the right internal jugular vein was entered with a 21 ga needle during real time ultrasound guidance. 0.018 inch guidewire placed and 21 ga needle exchanged for transitional dilator set. Utilizing fluoroscopy, 0.035 inch guidewire advanced through the needle without difficulty. Attention then turned to the right anterior upper chest. Following local lidocaine administration, a port pocket was created. The catheter was connected to the port and brought from the pocket to the venotomy site through a subcutaneous tunnel. The catheter was cut to size and inserted through the peel-away sheath. The catheter tip was positioned at the cavoatrial junction using fluoroscopic guidance. The port aspirated and flushed well. The port pocket was closed with deep and superficial absorbable suture. The port pocket incision and venotomy sites were also sealed with Dermabond. IMPRESSION: Successful placement of a right internal jugular approach power injectable Port-A-Cath. The catheter is ready for  immediate use. Electronically Signed   By: Miachel Roux M.D.   On: 08/27/2020 13:25   CT ABDOMINAL MASS BIOPSY  Result Date: 08/09/2020 INDICATION: 65 year old with peritoneal disease of uncertain etiology. Tissue diagnosis is needed. EXAM: CT-GUIDED CORE BIOPSY OF OMENTAL/PERITONEAL NODULARITY MEDICATIONS: Moderate sedation ANESTHESIA/SEDATION: Moderate (conscious) sedation was employed during this procedure. A total of Versed 1.0 mg and Fentanyl 100 mcg was administered intravenously. Moderate Sedation Time: 12 minutes. The patient's level of consciousness and vital signs were monitored continuously by radiology nursing throughout the procedure under my direct supervision. FLUOROSCOPY TIME:  None COMPLICATIONS: None immediate. PROCEDURE: Informed  written consent was obtained from the patient after a thorough discussion of the procedural risks, benefits and alternatives. All questions were addressed. A timeout was performed prior to the initiation of the procedure. Patient was placed supine on the CT scanner. Images through the abdomen were obtained. The nodularity in the anterior upper abdomen was identified and targeted. The skin was prepped with chlorhexidine and sterile field was created. Skin and soft tissues were anesthetized using 1% lidocaine. A small incision was made. Using CT guidance, a 17 gauge coaxial needle was directed into the anterior peritoneal disease just left of midline. Needle position was confirmed within the anterior aspect of the nodularity with CT. Total of 4 core biopsies were obtained with an 18 gauge core device. Specimens placed in formalin. Needle was removed and follow up CT images were obtained. FINDINGS: Peritoneal thickening and nodularity along the anterior abdomen, just left of midline. Needle position confirmed within the disease. Adequate core specimens were obtained. IMPRESSION: CT-guided core biopsy of the peritoneal/omental disease. Electronically Signed   By: Markus Daft M.D.   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  PLAN:    1.  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  Biopsy confirmed adenocarcinoma likely of colon origin.  Colonoscopy on Aug 13, 2020 revealed transverse colon mass that was biopsied and although morphologically different, consistent with her metastatic disease. CEA is also elevated 323.  PET scan results from August 25, 2020 reviewed independently and reported as above confirming CT results and stable disease.  Patient has had port placement.  Proceed with cycle 1 of FOLFOX  plus Avastin today.  Return to clinic in 2 days for pump removal, in 1 week for laboratory work and further evaluation, and then in 2 weeks for further evaluation and consideration of cycle 2.   2.  Abdominal pain: Patient does not complain of this today.  Continue Percocet as needed. 3.  Renal insufficiency: Patient's creatinine is mildly elevated at 1.4, monitor. 4.  Hypokalemia: Mild, monitor.   Patient expressed understanding and was in agreement with this plan. She also understands that She can call clinic at any time with any questions, concerns, or complaints.   Cancer Staging Adenocarcinoma of transverse colon [facility]) Staging form: Colon and Rectum - Neuroendocine Tumors, AJCC 8th Edition - Clinical stage from 08/18/2020: Stage IV (cTX, cNX, pM1b) - Signed by Lloyd Huger, MD on 08/18/2020 Stage prefix: Initial diagnosis   Lloyd Huger, MD   08/31/2020 1:30 PM

## 2020-08-27 NOTE — Progress Notes (Signed)
Patient clinically stable post Port placement per Dr Dwaine Gale, tolerated well. Received Versed 2 mg along with Fentanyl 100 mcg IV for procedure. Vitals stable pre and post procedure. Report given to [facility] post procedure/specials.

## 2020-08-27 NOTE — Procedures (Signed)
Interventional Radiology Procedure Note  Procedure: Chest port  Indication: Colon ca  Findings: Please refer to procedural dictation for full description.  Complications: None  EBL: < 10 mL  Miachel Roux, MD [phone]

## 2020-08-27 NOTE — H&P (Signed)
Chief Complaint: Patient was seen in consultation today for port placement.  Referring Physician(s): Finnegan,Timothy J  Supervising Physician: Mir, Sharen Heck  Patient Status: [facility] - Out-pt  History of Present Illness: Judith Ross is a 65 y.o. female with a past medical history significant for sleep apnea, HTN, HLD and recently diagnosed metastatic colon cancer who presents today for port placement to begin chemotherapy. Judith Ross began having abdominal pain and weight loss earlier this year, CT abd/pelvis on 08/02/20 showed diffuse peritoneal carcinomatosis with mild ascites and a 6 cm central pelvic soft tissue mass in the hysterectomy bed consistent with recurrent/metastatic carcinoma as well as luminal narrowing and wall thickening of the transverse colon. She underwent peritoneal biopsy in IR on 5/23, pathology of which showed metastatic adenocarcinoma consistent with colon, pancreaticobiliary, upper GI or bladder primary. She underwent endoscopy/colonoscopy on 5/27 and pathology of the colon biopsy showed adenocarcinoma which was dissimilar from the omental carcinoma. 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 IR has been asked to place a port for durable venous access.  Past Medical History:  Diagnosis Date   Arthritis    Chicken pox    Hyperlipidemia    Hypertension    Obesity    Sleep apnea    Thyroid disease     Past Surgical History:  Procedure Laterality Date   ABDOMINAL HYSTERECTOMY  12/2009   total   COLONOSCOPY WITH PROPOFOL N/A 08/13/2020   Procedure: COLONOSCOPY WITH PROPOFOL;  Surgeon: Jonathon Bellows, MD;  Location: [facility] ENDOSCOPY;  Service: Gastroenterology;  Laterality: N/A;   ESOPHAGOGASTRODUODENOSCOPY (EGD) WITH PROPOFOL N/A 08/13/2020   Procedure: ESOPHAGOGASTRODUODENOSCOPY (EGD) WITH PROPOFOL;  Surgeon: Jonathon Bellows, MD;  Location: [facility] ENDOSCOPY;  Service:  Gastroenterology;  Laterality: N/A;   supracervical abdominal hysterectomy and bilateral salpingo--oophorectomy 01-17-2010 for fibroids  01/17/2010   TOTAL THYROIDECTOMY  1991-92   TUBAL LIGATION      Allergies: Erythromycin  Medications: Prior to Admission medications   Medication Sig Start Date End Date Taking? Authorizing Provider  atorvastatin (LIPITOR) 10 MG tablet TAKE 1 TABLET(10 MG) BY MOUTH DAILY 07/30/20  Yes Dutch Quint B, FNP  benazepril (LOTENSIN) 20 MG tablet TAKE 1 TABLET BY MOUTH DAILY 05/11/20  Yes Baity, Coralie Keens, NP  clobetasol ointment (TEMOVATE) 0.05 % Apply to affected area every night for 4 weeks, then every other day for 4 weeks and then twice a week for 4 weeks or until resolution. 05/14/17  Yes [name], MD  hydrochlorothiazide (HYDRODIURIL) 25 MG tablet Take 1 tablet (25 mg total) by mouth daily. 08/18/20  Yes Venia Carbon, MD  levothyroxine (SYNTHROID) 150 MCG tablet Take 1 tablet (150 mcg total) by mouth daily. 08/18/20  Yes Venia Carbon, MD  oxyCODONE-acetaminophen (PERCOCET/ROXICET) 5-325 MG tablet Take 1 tablet by mouth every 4 (four) hours as needed for severe pain. 08/18/20  Yes Lloyd Huger, MD  dicyclomine (BENTYL) 10 MG capsule Take 1 capsule (10 mg total) by mouth in the morning, at noon, in the evening, and at bedtime. Patient not taking: Reported on 08/27/2020 07/20/20   Jonathon Bellows, MD  lidocaine-prilocaine (EMLA) cream Apply to affected area once 08/18/20   Lloyd Huger, MD  Na Sulfate-K Sulfate-Mg Sulf 17.5-3.13-1.6 GM/177ML SOLN At 5 PM the day before procedure take 1 bottle and 5 hours before procedure take 1 bottle. Patient not taking: Reported on 08/27/2020 08/10/20   Jonathon Bellows, MD  ondansetron [facility])  8 MG tablet Take 1 tablet (8 mg total) by mouth 2 (two) times daily as needed for refractory nausea / vomiting. Patient not taking: Reported on 08/27/2020 08/18/20   Lloyd Huger, MD  prochlorperazine (COMPAZINE) 10 MG  tablet Take 1 tablet (10 mg total) by mouth every 6 (six) hours as needed (Nausea or vomiting). Patient not taking: Reported on 08/27/2020 08/18/20   Lloyd Huger, MD     Family History  Problem Relation Age of Onset   Stroke Father    Diabetes Maternal Grandmother    Hypertension Maternal Grandmother    Diabetes Maternal Uncle    Cancer Maternal Aunt        Breast    Social History   Socioeconomic History   Marital status: Divorced    Spouse name: Not on file   Number of children: Not on file   Years of education: Not on file   Highest education level: Not on file  Occupational History   Not on file  Tobacco Use   Smoking status: Never   Smokeless tobacco: Never  Vaping Use   Vaping Use: Never used  Substance and Sexual Activity   Alcohol use: No    Alcohol/week: 0.0 standard drinks   Drug use: No   Sexual activity: Yes  Other Topics Concern   Not on file  Social History Narrative   Not on file   Social Determinants of Health   Financial Resource Strain: Not on file  Food Insecurity: Not on file  Transportation Needs: Not on file  Physical Activity: Not on file  Stress: Not on file  Social Connections: Not on file     Review of Systems: A 12 point ROS discussed and pertinent positives are indicated in the HPI above.  All other systems are negative.  Review of Systems  Constitutional:  Negative for chills and fever.  Respiratory:  Negative for cough and shortness of breath.   Cardiovascular:  Negative for chest pain.  Gastrointestinal:  Negative for abdominal pain, diarrhea, nausea and vomiting.  Musculoskeletal:  Negative for back pain.  Neurological:  Negative for dizziness and headaches.   Vital Signs: BP 134/77   Pulse 91   Temp 98.9 F (37.2 C) (Oral)   Resp 18   Ht 5\' 6"  (1.676 m)   Wt 250 lb (113.4 kg)   SpO2 95%   BMI 40.35 kg/m   Physical Exam Vitals reviewed.  Constitutional:      General: She is not in acute distress. HENT:      Head: Normocephalic.     Mouth/Throat:     Mouth: Mucous membranes are moist.     Pharynx: Oropharynx is clear. No oropharyngeal exudate or posterior oropharyngeal erythema.  Cardiovascular:     Rate and Rhythm: Normal rate and regular rhythm.  Pulmonary:     Effort: Pulmonary effort is normal.     Breath sounds: Normal breath sounds.  Abdominal:     General: There is no distension.     Palpations: Abdomen is soft.     Tenderness: There is no abdominal tenderness.  Skin:    General: Skin is warm and dry.  Neurological:     Mental Status: She is alert and oriented to person, place, and time.  Psychiatric:        Mood and Affect: Mood normal.        Behavior: Behavior normal.        Thought Content: Thought content normal.  Judgment: Judgment normal.     MD Evaluation Airway: WNL Heart: WNL Abdomen: WNL Chest/ Lungs: WNL ASA  Classification: 3 Mallampati/Airway Score: Two   Imaging: CT Abdomen Pelvis W Contrast  Result Date: 08/03/2020 CLINICAL DATA:  Mid and lower abdominal pain for 2 months. Early satiety. EXAM: CT ABDOMEN AND PELVIS WITH CONTRAST TECHNIQUE: Multidetector CT imaging of the abdomen and pelvis was performed using the standard protocol following bolus administration of intravenous contrast. CONTRAST:  56mL OMNIPAQUE IOHEXOL 300 MG/ML  SOLN COMPARISON:  None. FINDINGS: Lower Chest: Tiny right pleural effusion noted. Hepatobiliary: No hepatic masses identified. Gallbladder is unremarkable. No evidence of biliary ductal dilatation. Mild diffuse peritoneal thickening or minimal ascites throughout the perihepatic space. Pancreas:  No mass or inflammatory changes. Spleen: Within normal limits in size and appearance. Adrenals/Urinary Tract: No masses identified. No evidence of ureteral calculi or hydronephrosis. Stomach/Bowel: Short segment area of luminal narrowing and wall thickening is seen involving the transverse colon with adjacent omental carcinomatosis.  This could represent the site of a primary colon carcinoma, versus metastatic disease causing extrinsic narrowing of the colon. Vascular/Lymphatic: No pathologically enlarged lymph nodes. No acute vascular findings. Reproductive: Patient has history of previous hysterectomy. A soft tissue density is seen in the central pelvic hysterectomy bed abutting the vaginal cuff which measures 5.8 x 3.4 cm on image 80/2. Mild ascites is seen in the pelvis and bilateral paracolic gutters. Diffuse omental caking is seen as well as soft tissue stranding and nodularity throughout the peritoneal fat, consistent with peritoneal carcinomatosis. Other:  None. Musculoskeletal:  No suspicious bone lesions identified. 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 6 cm central pelvic soft tissue mass in hysterectomy bed, consistent with recurrent/metastatic carcinoma. Short segment luminal narrowing and wall thickening involving the transverse colon. This could represent a primary colon carcinoma, versus extrinsic narrowing of the colon due to adjacent omental carcinomatosis. Tiny right pleural effusion. These results will be called to the ordering clinician or representative by the Radiologist Assistant, and communication documented in the PACS or [employer]. Electronically Signed   By: Marlaine Hind M.D.   On: 08/03/2020 09:40   NM PET Image Initial (PI) Skull Base To Thigh  Result Date: 08/25/2020 CLINICAL DATA:  Subsequent treatment strategy for colorectal carcinoma. EXAM: NUCLEAR MEDICINE PET SKULL BASE TO THIGH TECHNIQUE: 13.1 mCi F-18 FDG was injected intravenously. Full-ring PET imaging was performed from the skull base to thigh after the radiotracer. CT data was obtained and used for attenuation correction and anatomic localization. Fasting blood glucose: 101 mg/dl COMPARISON:  CT 08/02/2020 FINDINGS: Mediastinal blood pool activity: SUV max 2.9 Liver activity: SUV max NA  NECK: No hypermetabolic lymph nodes in the neck. Incidental CT findings: none CHEST: Large layering LEFT pleural effusion is new from comparison exam. No hypermetabolic mediastinal lymph nodes. No suspicious pulmonary nodules. Incidental CT findings: There is subareolar mass in the LEFT breast measuring 1.9 cm without metabolic activity. A similar mass in the RIGHT breast measuring 3.7 cm ABDOMEN/PELVIS: peritoneal thickening in the lesser omentum measures 4.4 by 3.0 cm on image 162 with intense metabolic activity (SUV max equal 15). The hypermetabolic activity extends extends to involve a 7 cm segment of transverse colon which is also hypermetabolic with SUV max equal 20.7 (image 168) There is a rim metabolic activity surrounding the RIGHT hepatic lobe consistent with peritoneal metastasis (SUV max equal 6.2). Scattered omental metastasis along the ventral peritoneal surface. For example midline the level the umbilicus  with SUV max equal 12.6. There is heterogeneous fat stranding at this level (image 189). There is hypermetabolic activity along the peritoneal reflections in the deep pelvis associated the small free fluid with SUV max equal 10.9. Lobular mass in the deep pelvis posterior to the bladder without significant metabolic activity is favored the uterus (4.5 cm image 225) Incidental CT findings: No bowel obstruction. No intraperitoneal free air SKELETON: No focal hypermetabolic activity to suggest skeletal metastasis. Incidental CT findings: none IMPRESSION: 1. Extensive omental and peritoneal hypermetabolic metastasis in the abdomen pelvis. 2. Hypermetabolic nodular thickening in the greater and lesser omentum. 3. Hypermetabolic circumferential thickening through the mid transverse colon. 4. Rim of hypointensity surrounding the liver as well as the deep peritoneal pelvis. 5. Mass within the deep pelvis is favor the uterus. 6. New large LEFT pleural effusion. No evidence thoracic metastasis. 7. Soft tissue  densities in the subareolar space on LEFT and RIGHT are favored benign breast lesions. Electronically Signed   By: Suzy Bouchard M.D.   On: 08/25/2020 17:04   CT ABDOMINAL MASS BIOPSY  Result Date: 08/09/2020 INDICATION: 65 year old with peritoneal disease of uncertain etiology. Tissue diagnosis is needed. EXAM: CT-GUIDED CORE BIOPSY OF OMENTAL/PERITONEAL NODULARITY MEDICATIONS: Moderate sedation ANESTHESIA/SEDATION: Moderate (conscious) sedation was employed during this procedure. A total of Versed 1.0 mg and Fentanyl 100 mcg was administered intravenously. Moderate Sedation Time: 12 minutes. The patient's level of consciousness and vital signs were monitored continuously by radiology nursing throughout the procedure under my direct supervision. FLUOROSCOPY TIME:  None COMPLICATIONS: None immediate. PROCEDURE: Informed written consent was obtained from the patient after a thorough discussion of the procedural risks, benefits and alternatives. All questions were addressed. A timeout was performed prior to the initiation of the procedure. Patient was placed supine on the CT scanner. Images through the abdomen were obtained. The nodularity in the anterior upper abdomen was identified and targeted. The skin was prepped with chlorhexidine and sterile field was created. Skin and soft tissues were anesthetized using 1% lidocaine. A small incision was made. Using CT guidance, a 17 gauge coaxial needle was directed into the anterior peritoneal disease just left of midline. Needle position was confirmed within the anterior aspect of the nodularity with CT. Total of 4 core biopsies were obtained with an 18 gauge core device. Specimens placed in formalin. Needle was removed and follow up CT images were obtained. FINDINGS: Peritoneal thickening and nodularity along the anterior abdomen, just left of midline. Needle position confirmed within the disease. Adequate core specimens were obtained. IMPRESSION: CT-guided core  biopsy of the peritoneal/omental disease. Electronically Signed   By: Markus Daft M.D.   On: 08/09/2020 11:14    Labs:  CBC: Recent Labs    04/20/20 1039 07/14/20 1121 07/30/20 1449 08/05/20 1007 08/09/20 0919  WBC 7.1 9.0  --  9.1 9.3  HGB 13.9 12.4  --  11.9* 11.5*  HCT 43.2 38.8  --  38.5 36.5  PLT 387.0 525.0* 554* 562* 555*    COAGS: Recent Labs    08/09/20 0919  INR 1.1    BMP: Recent Labs    04/20/20 1039 07/14/20 1121 07/27/20 1415 08/05/20 1007  NA 142 139 137 140  K 4.3 4.2 4.7 4.3  CL 102 98 98 100  CO2 33* 29 30 28   GLUCOSE 93 95 98 109*  BUN 14 19 27* 17  CALCIUM 9.1 9.1 8.9 8.4*  CREATININE 0.99 1.19 1.67* 1.15*  GFRNONAA  --   --   --  53*    LIVER FUNCTION TESTS: Recent Labs    04/20/20 1039 07/14/20 1121 07/27/20 1415 08/05/20 1007  BILITOT 0.3 0.4 0.5 0.7  AST 14 13 14 18   ALT 18 10 9 12   ALKPHOS 88 87 89 85  PROT 7.1 7.8 7.1 8.1  ALBUMIN 4.0 4.0 3.9 3.5    TUMOR MARKERS: No results for input(s): AFPTM, CEA, CA199, CHROMGRNA in the last 8760 hours.  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  Risks and benefits of image-guided Port-a-catheter placement were discussed with the patient including, but not limited to bleeding, infection, pneumothorax, or fibrin sheath development and need for additional procedures.  All of the patient's questions were answered, patient is agreeable to proceed.  Consent signed and in chart.  Thank you for this interesting consult.  I greatly enjoyed meeting Judith Ross and look forward to participating in their care.  A copy of this report was sent to the requesting provider on this date.  Electronically Signed: Joaquim Nam, PA-C 08/27/2020, 10:15 AM   I spent a total of  15 Minutes in face to face in clinical consultation, greater than 50% of  which was counseling/coordinating care for port placement.

## 2020-08-31 ENCOUNTER — Telehealth: Payer: Self-pay

## 2020-08-31 ENCOUNTER — Other Ambulatory Visit: Payer: Self-pay

## 2020-08-31 ENCOUNTER — Inpatient Hospital Stay (HOSPITAL_BASED_OUTPATIENT_CLINIC_OR_DEPARTMENT_OTHER): Payer: Medicare Other | Admitting: Oncology

## 2020-08-31 ENCOUNTER — Encounter: Payer: Self-pay | Admitting: Oncology

## 2020-08-31 ENCOUNTER — Inpatient Hospital Stay: Payer: Medicare Other

## 2020-08-31 VITALS — BP 115/75 | HR 101 | Temp 97.5°F | Resp 16 | Wt 242.0 lb

## 2020-08-31 DIAGNOSIS — C184 Malignant neoplasm of transverse colon: Secondary | ICD-10-CM

## 2020-08-31 DIAGNOSIS — A048 Other specified bacterial intestinal infections: Secondary | ICD-10-CM

## 2020-08-31 DIAGNOSIS — Z5111 Encounter for antineoplastic chemotherapy: Secondary | ICD-10-CM | POA: Diagnosis not present

## 2020-08-31 LAB — URINALYSIS, DIPSTICK ONLY
Glucose, UA: NEGATIVE mg/dL
Hgb urine dipstick: NEGATIVE
Ketones, ur: NEGATIVE mg/dL
Nitrite: NEGATIVE
Protein, ur: 100 mg/dL — AB
Specific Gravity, Urine: 1.029 (ref 1.005–1.030)
pH: 5 (ref 5.0–8.0)

## 2020-08-31 LAB — COMPREHENSIVE METABOLIC PANEL
ALT: 10 U/L (ref 0–44)
AST: 19 U/L (ref 15–41)
Albumin: 3.4 g/dL — ABNORMAL LOW (ref 3.5–5.0)
Alkaline Phosphatase: 77 U/L (ref 38–126)
Anion gap: 13 (ref 5–15)
BUN: 19 mg/dL (ref 8–23)
CO2: 30 mmol/L (ref 22–32)
Calcium: 8.5 mg/dL — ABNORMAL LOW (ref 8.9–10.3)
Chloride: 93 mmol/L — ABNORMAL LOW (ref 98–111)
Creatinine, Ser: 1.4 mg/dL — ABNORMAL HIGH (ref 0.44–1.00)
GFR, Estimated: 42 mL/min — ABNORMAL LOW (ref >60)
Glucose, Bld: 114 mg/dL — ABNORMAL HIGH (ref 70–99)
Potassium: 3.4 mmol/L — ABNORMAL LOW (ref 3.5–5.1)
Sodium: 136 mmol/L (ref 135–145)
Total Bilirubin: 0.8 mg/dL (ref 0.3–1.2)
Total Protein: 8.2 g/dL — ABNORMAL HIGH (ref 6.5–8.1)

## 2020-08-31 LAB — CBC WITH DIFFERENTIAL/PLATELET
Abs Immature Granulocytes: 0.03 10*3/uL (ref 0.00–0.07)
Basophils Absolute: 0.1 10*3/uL (ref 0.0–0.1)
Basophils Relative: 1 %
Eosinophils Absolute: 0.2 10*3/uL (ref 0.0–0.5)
Eosinophils Relative: 2 %
HCT: 37.6 % (ref 36.0–46.0)
Hemoglobin: 11.9 g/dL — ABNORMAL LOW (ref 12.0–15.0)
Immature Granulocytes: 0 %
Lymphocytes Relative: 19 %
Lymphs Abs: 1.8 10*3/uL (ref 0.7–4.0)
MCH: 24.6 pg — ABNORMAL LOW (ref 26.0–34.0)
MCHC: 31.6 g/dL (ref 30.0–36.0)
MCV: 77.8 fL — ABNORMAL LOW (ref 80.0–100.0)
Monocytes Absolute: 0.8 10*3/uL (ref 0.1–1.0)
Monocytes Relative: 8 %
Neutro Abs: 7 10*3/uL (ref 1.7–7.7)
Neutrophils Relative %: 70 %
Platelets: 523 10*3/uL — ABNORMAL HIGH (ref 150–400)
RBC: 4.83 MIL/uL (ref 3.87–5.11)
RDW: 14.4 % (ref 11.5–15.5)
WBC: 9.9 10*3/uL (ref 4.0–10.5)
nRBC: 0 % (ref 0.0–0.2)

## 2020-08-31 MED ORDER — SODIUM CHLORIDE 0.9 % IV SOLN
5.0000 mg/kg | Freq: Once | INTRAVENOUS | Status: AC
  Administered 2020-08-31: 600 mg via INTRAVENOUS
  Filled 2020-08-31: qty 16

## 2020-08-31 MED ORDER — FLUOROURACIL CHEMO INJECTION 2.5 GM/50ML
400.0000 mg/m2 | Freq: Once | INTRAVENOUS | Status: AC
  Administered 2020-08-31: 900 mg via INTRAVENOUS
  Filled 2020-08-31: qty 18

## 2020-08-31 MED ORDER — DOXYCYCLINE MONOHYDRATE 100 MG PO CAPS: 100.0000 mg | ORAL_CAPSULE | Freq: Two times a day (BID) | ORAL | 0 refills | Status: DC

## 2020-08-31 MED ORDER — SODIUM CHLORIDE 0.9 % IV SOLN
Freq: Once | INTRAVENOUS | Status: AC
  Filled 2020-08-31: qty 250

## 2020-08-31 MED ORDER — METRONIDAZOLE 500 MG PO TABS: 500.0000 mg | ORAL_TABLET | Freq: Two times a day (BID) | ORAL | 0 refills | Status: DC

## 2020-08-31 MED ORDER — LEUCOVORIN CALCIUM INJECTION 350 MG
391.0000 mg/m2 | Freq: Once | INTRAVENOUS | Status: AC
  Administered 2020-08-31: 900 mg via INTRAVENOUS
  Filled 2020-08-31: qty 45

## 2020-08-31 MED ORDER — SODIUM CHLORIDE 0.9 % IV SOLN
10.0000 mg | Freq: Once | INTRAVENOUS | Status: AC
  Administered 2020-08-31: 10 mg via INTRAVENOUS
  Filled 2020-08-31: qty 10

## 2020-08-31 MED ORDER — PALONOSETRON HCL INJECTION 0.25 MG/5ML
0.2500 mg | Freq: Once | INTRAVENOUS | Status: AC
  Administered 2020-08-31: 0.25 mg via INTRAVENOUS
  Filled 2020-08-31: qty 5

## 2020-08-31 MED ORDER — SODIUM CHLORIDE 0.9 % IV SOLN
2400.0000 mg/m2 | INTRAVENOUS | Status: DC
  Administered 2020-08-31: 5500 mg via INTRAVENOUS
  Filled 2020-08-31: qty 110

## 2020-08-31 MED ORDER — PEPTO-BISMOL 524 MG/30ML PO SUSP: 30.0000 mL | ORAL | 0 refills | Status: DC | PRN

## 2020-08-31 MED ORDER — OXALIPLATIN CHEMO INJECTION 100 MG/20ML
87.0000 mg/m2 | Freq: Once | INTRAVENOUS | Status: AC
  Administered 2020-08-31: 200 mg via INTRAVENOUS
  Filled 2020-08-31: qty 40

## 2020-08-31 MED ORDER — SODIUM CHLORIDE 0.9% FLUSH
10.0000 mL | INTRAVENOUS | Status: DC | PRN
  Administered 2020-08-31: 10 mL via INTRAVENOUS
  Filled 2020-08-31: qty 10

## 2020-08-31 MED ORDER — DEXTROSE 5 % IV SOLN
Freq: Once | INTRAVENOUS | Status: AC
Start: 2020-08-31 — End: 2020-08-31
  Filled 2020-08-31: qty 250

## 2020-08-31 MED ORDER — OMEPRAZOLE 20 MG PO CPDR: 20.0000 mg | DELAYED_RELEASE_CAPSULE | Freq: Two times a day (BID) | ORAL | 0 refills | Status: DC

## 2020-08-31 NOTE — Progress Notes (Signed)
HR 101 ok to proceed per MD 

## 2020-08-31 NOTE — Telephone Encounter (Signed)
Patient was contacted and informed her the below information. Patient agreed with taking the medications for the 14 days and that she is to come in 6 weeks to the clinic after she is done with her antibiotics.

## 2020-08-31 NOTE — Telephone Encounter (Signed)
-----   Message from Jonathon Bellows, MD sent at 08/31/2020 10:18 AM EDT ----- Ifnform    Biopsies show H pylori gastritis suggest    Bismuth subsalicylate  903 mgFour times daily Doxycycline 100 mg  BID Metronidazole 500 mg TID Omeprazole 20 mg BID  All for 14 days   Check for eradication with H pylori breath test 6 weeks after

## 2020-08-31 NOTE — Patient Instructions (Signed)
[facility]  Discharge Instructions: Thank you for choosing [facility] to provide your oncology and hematology care.  If you have a lab appointment with the [facility], please go directly to the [facility] and check in at the registration area.  Wear comfortable clothing and clothing appropriate for easy access to any Portacath or PICC line.   We strive to give you quality time with your provider. You may need to reschedule your appointment if you arrive late (15 or more minutes).  Arriving late affects you and other patients whose appointments are after yours.  Also, if you miss three or more appointments without notifying the office, you may be dismissed from the clinic at the provider's discretion.      For prescription refill requests, have your pharmacy contact our office and allow 72 hours for refills to be completed.    Today you received the following chemotherapy and/or immunotherapy agents : Avastin / Folfox   To help prevent nausea and vomiting after your treatment, we encourage you to take your nausea medication as directed.  BELOW ARE SYMPTOMS THAT SHOULD BE REPORTED IMMEDIATELY: *FEVER GREATER THAN 100.4 F (38 C) OR HIGHER *CHILLS OR SWEATING *NAUSEA AND VOMITING THAT IS NOT CONTROLLED WITH YOUR NAUSEA MEDICATION *UNUSUAL SHORTNESS OF BREATH *UNUSUAL BRUISING OR BLEEDING *URINARY PROBLEMS (pain or burning when urinating, or frequent urination) *BOWEL PROBLEMS (unusual diarrhea, constipation, pain near the anus) TENDERNESS IN MOUTH AND THROAT WITH OR WITHOUT PRESENCE OF ULCERS (sore throat, sores in mouth, or a toothache) UNUSUAL RASH, SWELLING OR PAIN  UNUSUAL VAGINAL DISCHARGE OR ITCHING   Items with * indicate a potential emergency and should be followed up as soon as possible or go to the Emergency Department if any problems should occur.  Please show the CHEMOTHERAPY ALERT CARD or IMMUNOTHERAPY ALERT CARD at  check-in to the Emergency Department and triage nurse.  Should you have questions after your visit or need to cancel or reschedule your appointment, please contact [facility]  [phone] and follow the prompts.  Office hours are 8:00 a.m. to 4:30 p.m. Monday - Friday. Please note that voicemails left after 4:00 p.m. may not be returned until the following business day.  We are closed weekends and major holidays. You have access to a nurse at all times for urgent questions. Please call the main number to the clinic [phone] and follow the prompts.  For any non-urgent questions, you may also contact your provider using MyChart. We now offer e-Visits for anyone 46 and older to request care online for non-urgent symptoms. For details visit [email or web address].   Also download the MyChart app! Go to the app store, search "MyChart", open the app, select [facility], and log in with your MyChart username and password.  Due to Covid, a mask is required upon entering the hospital/clinic. If you do not have a mask, one will be given to you upon arrival. For doctor visits, patients may have 1 support person aged 62 or older with them. For treatment visits, patients cannot have anyone with them due to current Covid guidelines and our immunocompromised population.

## 2020-08-31 NOTE — Progress Notes (Signed)
Pt reports decreased appetite, weight down  8 lbs since August 18, 2020.  Pt states short of breath on minimal exertion. Pt reports being nervous, today is her first treatment.

## 2020-09-01 ENCOUNTER — Telehealth: Payer: Self-pay

## 2020-09-01 NOTE — Telephone Encounter (Signed)
Telephone call to patient for follow up after receiving first infusion.   Patient states infusion went great.  States eating good and trying to drink plenty of fluids.   Denies any nausea or vomiting.  Encouraged patient to call for any concerns or questions.

## 2020-09-02 ENCOUNTER — Other Ambulatory Visit: Payer: Self-pay

## 2020-09-02 ENCOUNTER — Inpatient Hospital Stay: Payer: Medicare Other

## 2020-09-02 DIAGNOSIS — C184 Malignant neoplasm of transverse colon: Secondary | ICD-10-CM

## 2020-09-02 DIAGNOSIS — Z5111 Encounter for antineoplastic chemotherapy: Secondary | ICD-10-CM | POA: Diagnosis not present

## 2020-09-02 MED ORDER — SODIUM CHLORIDE 0.9% FLUSH
10.0000 mL | INTRAVENOUS | Status: DC | PRN
  Administered 2020-09-02: 10 mL
  Filled 2020-09-02: qty 10

## 2020-09-02 MED ORDER — HEPARIN SOD (PORK) LOCK FLUSH 100 UNIT/ML IV SOLN
INTRAVENOUS | Status: AC
  Filled 2020-09-02: qty 5

## 2020-09-02 MED ORDER — HEPARIN SOD (PORK) LOCK FLUSH 100 UNIT/ML IV SOLN
500.0000 [IU] | Freq: Once | INTRAVENOUS | Status: AC | PRN
Start: 2020-09-02 — End: 2020-09-02
  Administered 2020-09-02: 500 [IU]
  Filled 2020-09-02: qty 5

## 2020-09-07 ENCOUNTER — Inpatient Hospital Stay: Payer: Medicare Other

## 2020-09-07 ENCOUNTER — Inpatient Hospital Stay (HOSPITAL_BASED_OUTPATIENT_CLINIC_OR_DEPARTMENT_OTHER): Payer: Medicare Other | Admitting: Oncology

## 2020-09-07 VITALS — BP 100/66 | HR 92 | Temp 98.1°F | Resp 16 | Ht 66.0 in | Wt 234.0 lb

## 2020-09-07 DIAGNOSIS — C184 Malignant neoplasm of transverse colon: Secondary | ICD-10-CM

## 2020-09-07 DIAGNOSIS — Z5111 Encounter for antineoplastic chemotherapy: Secondary | ICD-10-CM | POA: Diagnosis not present

## 2020-09-07 LAB — CBC WITH DIFFERENTIAL/PLATELET
Abs Immature Granulocytes: 0.01 10*3/uL (ref 0.00–0.07)
Basophils Absolute: 0 10*3/uL (ref 0.0–0.1)
Basophils Relative: 1 %
Eosinophils Absolute: 0.2 10*3/uL (ref 0.0–0.5)
Eosinophils Relative: 3 %
HCT: 38.3 % (ref 36.0–46.0)
Hemoglobin: 11.9 g/dL — ABNORMAL LOW (ref 12.0–15.0)
Immature Granulocytes: 0 %
Lymphocytes Relative: 36 %
Lymphs Abs: 1.7 10*3/uL (ref 0.7–4.0)
MCH: 24.1 pg — ABNORMAL LOW (ref 26.0–34.0)
MCHC: 31.1 g/dL (ref 30.0–36.0)
MCV: 77.7 fL — ABNORMAL LOW (ref 80.0–100.0)
Monocytes Absolute: 0.2 10*3/uL (ref 0.1–1.0)
Monocytes Relative: 4 %
Neutro Abs: 2.6 10*3/uL (ref 1.7–7.7)
Neutrophils Relative %: 56 %
Platelets: 354 10*3/uL (ref 150–400)
RBC: 4.93 MIL/uL (ref 3.87–5.11)
RDW: 14.3 % (ref 11.5–15.5)
WBC: 4.6 10*3/uL (ref 4.0–10.5)
nRBC: 0 % (ref 0.0–0.2)

## 2020-09-07 LAB — URINALYSIS, DIPSTICK ONLY
Bilirubin Urine: NEGATIVE
Glucose, UA: NEGATIVE mg/dL
Hgb urine dipstick: NEGATIVE
Ketones, ur: 5 mg/dL — AB
Nitrite: NEGATIVE
Protein, ur: 30 mg/dL — AB
Specific Gravity, Urine: 1.026 (ref 1.005–1.030)
pH: 5 (ref 5.0–8.0)

## 2020-09-07 LAB — COMPREHENSIVE METABOLIC PANEL
ALT: 9 U/L (ref 0–44)
AST: 24 U/L (ref 15–41)
Albumin: 3.5 g/dL (ref 3.5–5.0)
Alkaline Phosphatase: 78 U/L (ref 38–126)
Anion gap: 15 (ref 5–15)
BUN: 29 mg/dL — ABNORMAL HIGH (ref 8–23)
CO2: 32 mmol/L (ref 22–32)
Calcium: 8.5 mg/dL — ABNORMAL LOW (ref 8.9–10.3)
Chloride: 88 mmol/L — ABNORMAL LOW (ref 98–111)
Creatinine, Ser: 1.45 mg/dL — ABNORMAL HIGH (ref 0.44–1.00)
GFR, Estimated: 40 mL/min — ABNORMAL LOW (ref >60)
Glucose, Bld: 93 mg/dL (ref 70–99)
Potassium: 3.8 mmol/L (ref 3.5–5.1)
Sodium: 135 mmol/L (ref 135–145)
Total Bilirubin: 1 mg/dL (ref 0.3–1.2)
Total Protein: 8.3 g/dL — ABNORMAL HIGH (ref 6.5–8.1)

## 2020-09-07 NOTE — Progress Notes (Signed)
Pt in for follow up, had first treatment last week.  Reports has had issues with nausea and vomiting, unrelieved by antiemetics.  Pt reports no appetite and drinking one ensure a day. Pt is has shortness of breath on minimal exertion.  Pt also reports unable to take flagyl, pill too large to swallow.

## 2020-09-07 NOTE — Progress Notes (Signed)
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  INTERVAL HISTORY: Patient returns to clinic today for repeat laboratory work and to assess her toleration of cycle 1 of FOLFOX plus Avastin.  Patient states she had increased weakness and fatigue and a poor appetite this past week.  She also had increased nausea and vomiting.  Her symptoms have not improved. She does not complain of abdominal pain today.  She has no neurologic complaints.  She denies any recent fevers or illnesses. She has no chest pain, shortness of breath, cough, or hemoptysis.  She denies any constipation or diarrhea.  She has no melena or hematochezia.  She has no urinary complaints.  Patient has no further specific complaints today.  REVIEW OF SYSTEMS:   Review of Systems  Constitutional:  Positive for malaise/fatigue. Negative for fever and weight loss.  Respiratory: Negative.  Negative for cough, hemoptysis and shortness of breath.   Cardiovascular: Negative.  Negative for chest pain and leg swelling.  Gastrointestinal:  Positive for nausea and vomiting. Negative for abdominal pain, blood in stool, constipation, diarrhea and melena.  Genitourinary: Negative.  Negative for dysuria.  Musculoskeletal: Negative.  Negative for back pain.  Skin: Negative.  Negative for rash.  Neurological:  Positive for weakness. Negative for dizziness, focal weakness and headaches.  Psychiatric/Behavioral:  The patient is nervous/anxious.    As per HPI. Otherwise, a complete review of systems is negative.  PAST MEDICAL HISTORY: Past Medical History:  Diagnosis Date   Arthritis    Chicken pox    Hyperlipidemia    Hypertension     Obesity    Sleep apnea    Thyroid disease     PAST SURGICAL HISTORY: Past Surgical History:  Procedure Laterality Date   ABDOMINAL HYSTERECTOMY  12/2009   total   COLONOSCOPY WITH PROPOFOL N/A 08/13/2020   Procedure: COLONOSCOPY WITH PROPOFOL;  Surgeon: Jonathon Bellows, MD;  Location: [facility] ENDOSCOPY;  Service: Gastroenterology;  Laterality: N/A;   ESOPHAGOGASTRODUODENOSCOPY (EGD) WITH PROPOFOL N/A 08/13/2020   Procedure: ESOPHAGOGASTRODUODENOSCOPY (EGD) WITH PROPOFOL;  Surgeon: Jonathon Bellows, MD;  Location: [facility] ENDOSCOPY;  Service: Gastroenterology;  Laterality: N/A;   IR IMAGING GUIDED PORT INSERTION  08/27/2020   supracervical abdominal hysterectomy and bilateral salpingo--oophorectomy 01-17-2010 for fibroids  01/17/2010   TOTAL THYROIDECTOMY  1991-92   TUBAL LIGATION      FAMILY HISTORY: Family History  Problem Relation Age of Onset   Stroke Father    Diabetes Maternal Grandmother    Hypertension Maternal Grandmother    Diabetes Maternal Uncle    Cancer Maternal Aunt        Breast    ADVANCED DIRECTIVES (Y/N):  N  HEALTH MAINTENANCE: Social History   Tobacco Use   Smoking status: Never   Smokeless tobacco: Never  Vaping Use   Vaping Use: Never used  Substance Use Topics   Alcohol use: No    Alcohol/week: 0.0 standard drinks   Drug use: No     Colonoscopy:  PAP:  Bone density:  Lipid panel:  Allergies  Allergen Reactions   Erythromycin Itching    Current Outpatient Medications  Medication Sig Dispense Refill   atorvastatin (LIPITOR) 10 MG tablet TAKE 1 TABLET(10 MG) BY MOUTH DAILY 90  tablet 0   benazepril (LOTENSIN) 20 MG tablet TAKE 1 TABLET BY MOUTH DAILY 90 tablet 2   bismuth subsalicylate (PEPTO-BISMOL) 262 MG/15ML suspension Take 30 mLs by mouth every 4 (four) hours as needed. Please provide enough ml to last her for 14 days. Thank you. 524 mL 0   clobetasol ointment (TEMOVATE) 0.05 % Apply to affected area every night for 4 weeks, then every other day  for 4 weeks and then twice a week for 4 weeks or until resolution. 30 g 5   dicyclomine (BENTYL) 10 MG capsule Take 1 capsule (10 mg total) by mouth in the morning, at noon, in the evening, and at bedtime. 90 capsule 3   doxycycline (MONODOX) 100 MG capsule Take 1 capsule (100 mg total) by mouth 2 (two) times daily. 28 capsule 0   hydrochlorothiazide (HYDRODIURIL) 25 MG tablet Take 1 tablet (25 mg total) by mouth daily. 90 tablet 3   levothyroxine (SYNTHROID) 150 MCG tablet Take 1 tablet (150 mcg total) by mouth daily. 90 tablet 3   lidocaine-prilocaine (EMLA) cream Apply to affected area once 30 g 3   omeprazole (PRILOSEC) 20 MG capsule Take 1 capsule (20 mg total) by mouth 2 (two) times daily before a meal. 28 capsule 0   ondansetron (ZOFRAN) 8 MG tablet Take 1 tablet (8 mg total) by mouth 2 (two) times daily as needed for refractory nausea / vomiting. 60 tablet 1   oxyCODONE-acetaminophen (PERCOCET/ROXICET) 5-325 MG tablet Take 1 tablet by mouth every 4 (four) hours as needed for severe pain. 30 tablet 0   prochlorperazine (COMPAZINE) 10 MG tablet Take 1 tablet (10 mg total) by mouth every 6 (six) hours as needed (Nausea or vomiting). 60 tablet 1   metroNIDAZOLE (FLAGYL) 500 MG tablet Take 1 tablet (500 mg total) by mouth 2 (two) times daily. (Patient not taking: Reported on 09/07/2020) 28 tablet 0   No current facility-administered medications for this visit.    OBJECTIVE: Vitals:   09/07/20 1346  BP: 100/66  Pulse: 92  Resp: 16  Temp: 98.1 F (36.7 C)  SpO2: 99%     Body mass index is 37.77 kg/m.    ECOG FS:1 - Symptomatic but completely ambulatory  General: Well-developed, well-nourished, no acute distress. Eyes: Pink conjunctiva, anicteric sclera. HEENT: Normocephalic, moist mucous membranes. Lungs: No audible wheezing or coughing. Heart: Regular rate and rhythm. Abdomen: Soft, nontender, no obvious distention. Musculoskeletal: No edema, cyanosis, or clubbing. Neuro: Alert,  answering all questions appropriately. Cranial nerves grossly intact. Skin: No rashes or petechiae noted. Psych: Normal affect.   LAB RESULTS:  Lab Results  Component Value Date   NA 135 09/07/2020   K 3.8 09/07/2020   CL 88 (L) 09/07/2020   CO2 32 09/07/2020   GLUCOSE 93 09/07/2020   BUN 29 (H) 09/07/2020   CREATININE 1.45 (H) 09/07/2020   CALCIUM 8.5 (L) 09/07/2020   PROT 8.3 (H) 09/07/2020   ALBUMIN 3.5 09/07/2020   AST 24 09/07/2020   ALT 9 09/07/2020   ALKPHOS 78 09/07/2020   BILITOT 1.0 09/07/2020   GFRNONAA 40 (L) 09/07/2020   GFRAA  01/10/2010    >60        The eGFR has been calculated using the MDRD equation. This calculation has not been validated in all clinical situations. eGFR's persistently <60 mL/min signify possible Chronic Kidney Disease.    Lab Results  Component Value Date   WBC 4.6 09/07/2020   NEUTROABS 2.6 09/07/2020   HGB 11.9 (  L) 09/07/2020   HCT 38.3 09/07/2020   MCV 77.7 (L) 09/07/2020   PLT 354 09/07/2020     STUDIES: NM PET Image Initial (PI) Skull Base To Thigh  Result Date: 08/25/2020 CLINICAL DATA:  Subsequent treatment strategy for colorectal carcinoma. EXAM: NUCLEAR MEDICINE PET SKULL BASE TO THIGH TECHNIQUE: 13.1 mCi F-18 FDG was injected intravenously. Full-ring PET imaging was performed from the skull base to thigh after the radiotracer. CT data was obtained and used for attenuation correction and anatomic localization. Fasting blood glucose: 101 mg/dl COMPARISON:  CT 08/02/2020 FINDINGS: Mediastinal blood pool activity: SUV max 2.9 Liver activity: SUV max NA NECK: No hypermetabolic lymph nodes in the neck. Incidental CT findings: none CHEST: Large layering LEFT pleural effusion is new from comparison exam. No hypermetabolic mediastinal lymph nodes. No suspicious pulmonary nodules. Incidental CT findings: There is subareolar mass in the LEFT breast measuring 1.9 cm without metabolic activity. A similar mass in the RIGHT breast  measuring 3.7 cm ABDOMEN/PELVIS: peritoneal thickening in the lesser omentum measures 4.4 by 3.0 cm on image 162 with intense metabolic activity (SUV max equal 15). The hypermetabolic activity extends extends to involve a 7 cm segment of transverse colon which is also hypermetabolic with SUV max equal 20.7 (image 168) There is a rim metabolic activity surrounding the RIGHT hepatic lobe consistent with peritoneal metastasis (SUV max equal 6.2). Scattered omental metastasis along the ventral peritoneal surface. For example midline the level the umbilicus with SUV max equal 12.6. There is heterogeneous fat stranding at this level (image 189). There is hypermetabolic activity along the peritoneal reflections in the deep pelvis associated the small free fluid with SUV max equal 10.9. Lobular mass in the deep pelvis posterior to the bladder without significant metabolic activity is favored the uterus (4.5 cm image 225) Incidental CT findings: No bowel obstruction. No intraperitoneal free air SKELETON: No focal hypermetabolic activity to suggest skeletal metastasis. Incidental CT findings: none IMPRESSION: 1. Extensive omental and peritoneal hypermetabolic metastasis in the abdomen pelvis. 2. Hypermetabolic nodular thickening in the greater and lesser omentum. 3. Hypermetabolic circumferential thickening through the mid transverse colon. 4. Rim of hypointensity surrounding the liver as well as the deep peritoneal pelvis. 5. Mass within the deep pelvis is favor the uterus. 6. New large LEFT pleural effusion. No evidence thoracic metastasis. 7. Soft tissue densities in the subareolar space on LEFT and RIGHT are favored benign breast lesions. Electronically Signed   By: Suzy Bouchard M.D.   On: 08/25/2020 17:04   IR IMAGING GUIDED PORT INSERTION  Result Date: 08/27/2020 INDICATION: Metastatic colorectal malignancy EXAM: IMPLANTED PORT A CATH PLACEMENT WITH ULTRASOUND AND FLUOROSCOPIC GUIDANCE MEDICATIONS: None  ANESTHESIA/SEDATION: Moderate (conscious) sedation was employed during this procedure. A total of Versed 2 mg and Fentanyl 100 mcg was administered intravenously. Moderate Sedation Time: 15 minutes. The patient's level of consciousness and vital signs were monitored continuously by radiology nursing throughout the procedure under my direct supervision. FLUOROSCOPY TIME:  0.3 minutes, (9.76 mGy) COMPLICATIONS: None immediate. PROCEDURE: The procedure, risks, benefits, and alternatives were explained to the patient. Questions regarding the procedure were encouraged and answered. The patient understands and consents to the procedure. A timeout was performed prior to the initiation of the procedure. Patient positioned supine on the angiography table. Right neck and anterior upper chest prepped and draped in the usual sterile fashion. All elements of maximal sterile barrier were utilized including, cap, mask, sterile gown, sterile gloves, large sterile drape, hand scrubbing and 2% Chlorhexidine for  skin cleaning. The right internal jugular vein was evaluated with ultrasound and shown to be patent. A permanent ultrasound image was obtained and placed in the patient's medical record. Local anesthesia was provided with 1% lidocaine with epinephrine. Using sterile gel and a sterile probe cover, the right internal jugular vein was entered with a 21 ga needle during real time ultrasound guidance. 0.018 inch guidewire placed and 21 ga needle exchanged for transitional dilator set. Utilizing fluoroscopy, 0.035 inch guidewire advanced through the needle without difficulty. Attention then turned to the right anterior upper chest. Following local lidocaine administration, a port pocket was created. The catheter was connected to the port and brought from the pocket to the venotomy site through a subcutaneous tunnel. The catheter was cut to size and inserted through the peel-away sheath. The catheter tip was positioned at the  cavoatrial junction using fluoroscopic guidance. The port aspirated and flushed well. The port pocket was closed with deep and superficial absorbable suture. The port pocket incision and venotomy sites were also sealed with Dermabond. IMPRESSION: Successful placement of a right internal jugular approach power injectable Port-A-Cath. The catheter is ready for immediate use. Electronically Signed   By: Miachel Roux M.D.   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  PLAN:    1.  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  Biopsy confirmed adenocarcinoma likely of colon origin.  Colonoscopy on Aug 13, 2020 revealed transverse colon mass that was biopsied and although morphologically different, consistent with her metastatic disease. CEA is also elevated 323.  PET scan results from August 25, 2020 reviewed independently and reported as above confirming CT results and stable disease.  Patient has had port placement.  Patient received cycle 1 of FOLFOX plus Avastin 1 week ago and tolerated treatment relatively well.  Return to clinic as previously scheduled in 1 week for further evaluation and consideration of cycle 2.  2.  Abdominal pain: Patient does not complain of this today.  Continue Percocet as needed. 3.  Renal insufficiency: Chronic and unchanged.  Patient's creatinine is 1.45 today. 4.  Hypokalemia: Resolved. 5.  Nausea and vomiting: Worse after chemo, but now resolved.  Continue antiemetics as prescribed.  Patient reports she can no longer take Flagyl and has been instructed to discuss with her primary GI physician at her clinic appointment tomorrow. 6.  Poor appetite: Improving.  Consider dietary referral in the future.  Patient expressed understanding and was in agreement with this  plan. She also understands that She can call clinic at any time with any questions, concerns, or complaints.   Cancer Staging Adenocarcinoma of transverse colon [facility]) Staging form: Colon and Rectum - Neuroendocine Tumors, AJCC 8th Edition - Clinical stage from 08/18/2020: Stage IV (cTX, cNX, pM1b) - Signed by Lloyd Huger, MD on 08/18/2020 Stage prefix: Initial diagnosis   Lloyd Huger, MD   09/09/2020 7:39 PM

## 2020-09-08 ENCOUNTER — Ambulatory Visit: Payer: Medicare Other | Admitting: Gastroenterology

## 2020-09-09 ENCOUNTER — Encounter: Payer: Self-pay | Admitting: Oncology

## 2020-09-11 NOTE — Progress Notes (Signed)
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  INTERVAL HISTORY: Patient returns to clinic today for further evaluation and consideration of cycle 2 of FOLFOX plus Avastin.  She continues to have a poor appetite.  She has chronic weakness and fatigue, but denies any further nausea or vomiting. She does not complain of abdominal pain today.  She has no neurologic complaints.  She denies any recent fevers or illnesses. She has no chest pain, shortness of breath, cough, or hemoptysis.  She denies any constipation or diarrhea.  She has no melena or hematochezia.  She has no urinary complaints.  Patient offers no further specific complaints today.  REVIEW OF SYSTEMS:   Review of Systems  Constitutional:  Positive for malaise/fatigue. Negative for fever and weight loss.  Respiratory: Negative.  Negative for cough, hemoptysis and shortness of breath.   Cardiovascular: Negative.  Negative for chest pain and leg swelling.  Gastrointestinal: Negative.  Negative for abdominal pain, blood in stool, constipation, diarrhea, melena, nausea and vomiting.  Genitourinary: Negative.  Negative for dysuria.  Musculoskeletal: Negative.  Negative for back pain.  Skin: Negative.  Negative for rash.  Neurological:  Positive for weakness. Negative for dizziness, focal weakness and headaches.  Psychiatric/Behavioral:  The patient is nervous/anxious.    As per HPI. Otherwise, a complete review of systems is negative.  PAST MEDICAL HISTORY: Past Medical History:  Diagnosis Date   Arthritis    Chicken pox    Hyperlipidemia    Hypertension    Obesity    Sleep apnea    Thyroid disease     PAST  SURGICAL HISTORY: Past Surgical History:  Procedure Laterality Date   ABDOMINAL HYSTERECTOMY  12/2009   total   COLONOSCOPY WITH PROPOFOL N/A 08/13/2020   Procedure: COLONOSCOPY WITH PROPOFOL;  Surgeon: Jonathon Bellows, MD;  Location: [facility] ENDOSCOPY;  Service: Gastroenterology;  Laterality: N/A;   ESOPHAGOGASTRODUODENOSCOPY (EGD) WITH PROPOFOL N/A 08/13/2020   Procedure: ESOPHAGOGASTRODUODENOSCOPY (EGD) WITH PROPOFOL;  Surgeon: Jonathon Bellows, MD;  Location: [facility] ENDOSCOPY;  Service: Gastroenterology;  Laterality: N/A;   IR IMAGING GUIDED PORT INSERTION  08/27/2020   supracervical abdominal hysterectomy and bilateral salpingo--oophorectomy 01-17-2010 for fibroids  01/17/2010   TOTAL THYROIDECTOMY  1991-92   TUBAL LIGATION      FAMILY HISTORY: Family History  Problem Relation Age of Onset   Stroke Father    Diabetes Maternal Grandmother    Hypertension Maternal Grandmother    Diabetes Maternal Uncle    Cancer Maternal Aunt        Breast    ADVANCED DIRECTIVES (Y/N):  N  HEALTH MAINTENANCE: Social History   Tobacco Use   Smoking status: Never   Smokeless tobacco: Never  Vaping Use   Vaping Use: Never used  Substance Use Topics   Alcohol use: No    Alcohol/week: 0.0 standard drinks   Drug use: No     Colonoscopy:  PAP:  Bone density:  Lipid panel:  Allergies  Allergen Reactions   Erythromycin Itching    Current Outpatient Medications  Medication Sig Dispense Refill   atorvastatin (LIPITOR) 10 MG tablet TAKE 1 TABLET(10 MG) BY MOUTH DAILY 90 tablet 0   benazepril (LOTENSIN) 20 MG tablet TAKE 1 TABLET BY MOUTH  DAILY 90 tablet 2   bismuth subsalicylate (PEPTO-BISMOL) 262 MG/15ML suspension Take 30 mLs by mouth every 4 (four) hours as needed. Please provide enough ml to last her for 14 days. Thank you. 524 mL 0   clobetasol ointment (TEMOVATE) 0.05 % Apply to affected area every night for 4 weeks, then every other day for 4 weeks and then twice a week for 4 weeks or until  resolution. 30 g 5   hydrochlorothiazide (HYDRODIURIL) 25 MG tablet Take 1 tablet (25 mg total) by mouth daily. 90 tablet 3   levothyroxine (SYNTHROID) 150 MCG tablet Take 1 tablet (150 mcg total) by mouth daily. 90 tablet 3   lidocaine-prilocaine (EMLA) cream Apply to affected area once 30 g 3   omeprazole (PRILOSEC) 20 MG capsule Take 1 capsule (20 mg total) by mouth 2 (two) times daily before a meal. 28 capsule 0   ondansetron (ZOFRAN) 8 MG tablet Take 1 tablet (8 mg total) by mouth 2 (two) times daily as needed for refractory nausea / vomiting. 60 tablet 1   oxyCODONE-acetaminophen (PERCOCET/ROXICET) 5-325 MG tablet Take 1 tablet by mouth every 4 (four) hours as needed for severe pain. 30 tablet 0   prochlorperazine (COMPAZINE) 10 MG tablet Take 1 tablet (10 mg total) by mouth every 6 (six) hours as needed (Nausea or vomiting). 60 tablet 1   dicyclomine (BENTYL) 10 MG capsule Take 1 capsule (10 mg total) by mouth in the morning, at noon, in the evening, and at bedtime. (Patient not taking: Reported on 09/14/2020) 90 capsule 3   doxycycline (MONODOX) 100 MG capsule Take 1 capsule (100 mg total) by mouth 2 (two) times daily. (Patient not taking: Reported on 09/14/2020) 28 capsule 0   metroNIDAZOLE (FLAGYL) 500 MG tablet Take 1 tablet (500 mg total) by mouth 2 (two) times daily. (Patient not taking: No sig reported) 28 tablet 0   No current facility-administered medications for this visit.    OBJECTIVE: Vitals:   09/14/20 0900  BP: 98/63  Pulse: 90  Resp: 16  Temp: (!) 96.7 F (35.9 C)  SpO2: 97%     Body mass index is 36.91 kg/m.    ECOG FS:1 - Symptomatic but completely ambulatory  General: Well-developed, well-nourished, no acute distress. Eyes: Pink conjunctiva, anicteric sclera. HEENT: Normocephalic, moist mucous membranes. Lungs: No audible wheezing or coughing. Heart: Regular rate and rhythm. Abdomen: Soft, nontender, no obvious distention. Musculoskeletal: No edema, cyanosis,  or clubbing. Neuro: Alert, answering all questions appropriately. Cranial nerves grossly intact. Skin: No rashes or petechiae noted. Psych: Normal affect.  LAB RESULTS:  Lab Results  Component Value Date   NA 140 09/14/2020   K 3.5 09/14/2020   CL 94 (L) 09/14/2020   CO2 30 09/14/2020   GLUCOSE 95 09/14/2020   BUN 25 (H) 09/14/2020   CREATININE 1.37 (H) 09/14/2020   CALCIUM 8.7 (L) 09/14/2020   PROT 8.2 (H) 09/14/2020   ALBUMIN 3.6 09/14/2020   AST 25 09/14/2020   ALT 9 09/14/2020   ALKPHOS 82 09/14/2020   BILITOT 1.0 09/14/2020   GFRNONAA 43 (L) 09/14/2020   GFRAA  01/10/2010    >60        The eGFR has been calculated using the MDRD equation. This calculation has not been validated in all clinical situations. eGFR's persistently <60 mL/min signify possible Chronic Kidney Disease.    Lab Results  Component Value Date   WBC 3.8 (L) 09/14/2020   NEUTROABS 1.6 (L) 09/14/2020   HGB 11.6 (  L) 09/14/2020   HCT 36.6 09/14/2020   MCV 78.0 (L) 09/14/2020   PLT 318 09/14/2020     STUDIES: NM PET Image Initial (PI) Skull Base To Thigh  Result Date: 08/25/2020 CLINICAL DATA:  Subsequent treatment strategy for colorectal carcinoma. EXAM: NUCLEAR MEDICINE PET SKULL BASE TO THIGH TECHNIQUE: 13.1 mCi F-18 FDG was injected intravenously. Full-ring PET imaging was performed from the skull base to thigh after the radiotracer. CT data was obtained and used for attenuation correction and anatomic localization. Fasting blood glucose: 101 mg/dl COMPARISON:  CT 08/02/2020 FINDINGS: Mediastinal blood pool activity: SUV max 2.9 Liver activity: SUV max NA NECK: No hypermetabolic lymph nodes in the neck. Incidental CT findings: none CHEST: Large layering LEFT pleural effusion is new from comparison exam. No hypermetabolic mediastinal lymph nodes. No suspicious pulmonary nodules. Incidental CT findings: There is subareolar mass in the LEFT breast measuring 1.9 cm without metabolic activity. A  similar mass in the RIGHT breast measuring 3.7 cm ABDOMEN/PELVIS: peritoneal thickening in the lesser omentum measures 4.4 by 3.0 cm on image 162 with intense metabolic activity (SUV max equal 15). The hypermetabolic activity extends extends to involve a 7 cm segment of transverse colon which is also hypermetabolic with SUV max equal 20.7 (image 168) There is a rim metabolic activity surrounding the RIGHT hepatic lobe consistent with peritoneal metastasis (SUV max equal 6.2). Scattered omental metastasis along the ventral peritoneal surface. For example midline the level the umbilicus with SUV max equal 12.6. There is heterogeneous fat stranding at this level (image 189). There is hypermetabolic activity along the peritoneal reflections in the deep pelvis associated the small free fluid with SUV max equal 10.9. Lobular mass in the deep pelvis posterior to the bladder without significant metabolic activity is favored the uterus (4.5 cm image 225) Incidental CT findings: No bowel obstruction. No intraperitoneal free air SKELETON: No focal hypermetabolic activity to suggest skeletal metastasis. Incidental CT findings: none IMPRESSION: 1. Extensive omental and peritoneal hypermetabolic metastasis in the abdomen pelvis. 2. Hypermetabolic nodular thickening in the greater and lesser omentum. 3. Hypermetabolic circumferential thickening through the mid transverse colon. 4. Rim of hypointensity surrounding the liver as well as the deep peritoneal pelvis. 5. Mass within the deep pelvis is favor the uterus. 6. New large LEFT pleural effusion. No evidence thoracic metastasis. 7. Soft tissue densities in the subareolar space on LEFT and RIGHT are favored benign breast lesions. Electronically Signed   By: Suzy Bouchard M.D.   On: 08/25/2020 17:04   IR IMAGING GUIDED PORT INSERTION  Result Date: 08/27/2020 INDICATION: Metastatic colorectal malignancy EXAM: IMPLANTED PORT A CATH PLACEMENT WITH ULTRASOUND AND FLUOROSCOPIC  GUIDANCE MEDICATIONS: None ANESTHESIA/SEDATION: Moderate (conscious) sedation was employed during this procedure. A total of Versed 2 mg and Fentanyl 100 mcg was administered intravenously. Moderate Sedation Time: 15 minutes. The patient's level of consciousness and vital signs were monitored continuously by radiology nursing throughout the procedure under my direct supervision. FLUOROSCOPY TIME:  0.3 minutes, (6.01 mGy) COMPLICATIONS: None immediate. PROCEDURE: The procedure, risks, benefits, and alternatives were explained to the patient. Questions regarding the procedure were encouraged and answered. The patient understands and consents to the procedure. A timeout was performed prior to the initiation of the procedure. Patient positioned supine on the angiography table. Right neck and anterior upper chest prepped and draped in the usual sterile fashion. All elements of maximal sterile barrier were utilized including, cap, mask, sterile gown, sterile gloves, large sterile drape, hand scrubbing and 2% Chlorhexidine for  skin cleaning. The right internal jugular vein was evaluated with ultrasound and shown to be patent. A permanent ultrasound image was obtained and placed in the patient's medical record. Local anesthesia was provided with 1% lidocaine with epinephrine. Using sterile gel and a sterile probe cover, the right internal jugular vein was entered with a 21 ga needle during real time ultrasound guidance. 0.018 inch guidewire placed and 21 ga needle exchanged for transitional dilator set. Utilizing fluoroscopy, 0.035 inch guidewire advanced through the needle without difficulty. Attention then turned to the right anterior upper chest. Following local lidocaine administration, a port pocket was created. The catheter was connected to the port and brought from the pocket to the venotomy site through a subcutaneous tunnel. The catheter was cut to size and inserted through the peel-away sheath. The catheter tip  was positioned at the cavoatrial junction using fluoroscopic guidance. The port aspirated and flushed well. The port pocket was closed with deep and superficial absorbable suture. The port pocket incision and venotomy sites were also sealed with Dermabond. IMPRESSION: Successful placement of a right internal jugular approach power injectable Port-A-Cath. The catheter is ready for immediate use. Electronically Signed   By: Miachel Roux M.D.   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  PLAN:    1.  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  Biopsy confirmed adenocarcinoma likely of colon origin.  Colonoscopy on Aug 13, 2020 revealed transverse colon mass that was biopsied and although morphologically different, consistent with her metastatic disease. CEA is also elevated 323.  PET scan results from August 25, 2020 reviewed independently and reported as above confirming CT results and stable disease.  Patient has had port placement.  Patient completed cycle 2 of FOLFOX plus Avastin, but then had a delayed reaction possibly secondary to oxaliplatin.  Patient fully recovered, but will discontinue oxaliplatin him and switch to irinotecan.  Return to clinic in 2 days for pump removal and then in 2 weeks for further evaluation and consideration of cycle 3 which will be FOLFIRI plus Avastin.  2.  Abdominal pain: Patient does not complain of this today.  Continue Percocet as needed. 3.  Renal insufficiency: Chronic and unchanged.  Patient's creatinine is 1.37 today. 4.  Hypokalemia: Resolved. 5.  Nausea and vomiting: Resolved.  Continue antiemetics as prescribed. 6.  Poor appetite: Appreciate dietary input. 7.  Leukopenia: Mild, monitor.  Proceed with treatment as above.  Patient expressed  understanding and was in agreement with this plan. She also understands that She can call clinic at any time with any questions, concerns, or complaints.   Cancer Staging Adenocarcinoma of transverse colon [facility]) Staging form: Colon and Rectum - Neuroendocine Tumors, AJCC 8th Edition - Clinical stage from 08/18/2020: Stage IV (cTX, cNX, pM1b) - Signed by Lloyd Huger, MD on 08/18/2020 Stage prefix: Initial diagnosis   Lloyd Huger, MD   09/15/2020 9:39 AM

## 2020-09-13 ENCOUNTER — Other Ambulatory Visit: Payer: Self-pay | Admitting: Gastroenterology

## 2020-09-14 ENCOUNTER — Inpatient Hospital Stay: Payer: Medicare Other

## 2020-09-14 ENCOUNTER — Encounter: Payer: Self-pay | Admitting: Oncology

## 2020-09-14 ENCOUNTER — Other Ambulatory Visit: Payer: Self-pay

## 2020-09-14 ENCOUNTER — Inpatient Hospital Stay (HOSPITAL_BASED_OUTPATIENT_CLINIC_OR_DEPARTMENT_OTHER): Payer: Medicare Other | Admitting: Oncology

## 2020-09-14 VITALS — BP 140/79 | HR 85 | Resp 18

## 2020-09-14 VITALS — BP 98/63 | HR 90 | Temp 96.7°F | Resp 16 | Wt 228.7 lb

## 2020-09-14 DIAGNOSIS — Z95828 Presence of other vascular implants and grafts: Secondary | ICD-10-CM

## 2020-09-14 DIAGNOSIS — C184 Malignant neoplasm of transverse colon: Secondary | ICD-10-CM

## 2020-09-14 DIAGNOSIS — Z5111 Encounter for antineoplastic chemotherapy: Secondary | ICD-10-CM | POA: Diagnosis not present

## 2020-09-14 LAB — CBC WITH DIFFERENTIAL/PLATELET
Abs Immature Granulocytes: 0.01 10*3/uL (ref 0.00–0.07)
Basophils Absolute: 0.1 10*3/uL (ref 0.0–0.1)
Basophils Relative: 1 %
Eosinophils Absolute: 0.1 10*3/uL (ref 0.0–0.5)
Eosinophils Relative: 2 %
HCT: 36.6 % (ref 36.0–46.0)
Hemoglobin: 11.6 g/dL — ABNORMAL LOW (ref 12.0–15.0)
Immature Granulocytes: 0 %
Lymphocytes Relative: 44 %
Lymphs Abs: 1.7 10*3/uL (ref 0.7–4.0)
MCH: 24.7 pg — ABNORMAL LOW (ref 26.0–34.0)
MCHC: 31.7 g/dL (ref 30.0–36.0)
MCV: 78 fL — ABNORMAL LOW (ref 80.0–100.0)
Monocytes Absolute: 0.5 10*3/uL (ref 0.1–1.0)
Monocytes Relative: 12 %
Neutro Abs: 1.6 10*3/uL — ABNORMAL LOW (ref 1.7–7.7)
Neutrophils Relative %: 41 %
Platelets: 318 10*3/uL (ref 150–400)
RBC: 4.69 MIL/uL (ref 3.87–5.11)
RDW: 15.5 % (ref 11.5–15.5)
WBC: 3.8 10*3/uL — ABNORMAL LOW (ref 4.0–10.5)
nRBC: 0 % (ref 0.0–0.2)

## 2020-09-14 LAB — COMPREHENSIVE METABOLIC PANEL
ALT: 9 U/L (ref 0–44)
AST: 25 U/L (ref 15–41)
Albumin: 3.6 g/dL (ref 3.5–5.0)
Alkaline Phosphatase: 82 U/L (ref 38–126)
Anion gap: 16 — ABNORMAL HIGH (ref 5–15)
BUN: 25 mg/dL — ABNORMAL HIGH (ref 8–23)
CO2: 30 mmol/L (ref 22–32)
Calcium: 8.7 mg/dL — ABNORMAL LOW (ref 8.9–10.3)
Chloride: 94 mmol/L — ABNORMAL LOW (ref 98–111)
Creatinine, Ser: 1.37 mg/dL — ABNORMAL HIGH (ref 0.44–1.00)
GFR, Estimated: 43 mL/min — ABNORMAL LOW (ref >60)
Glucose, Bld: 95 mg/dL (ref 70–99)
Potassium: 3.5 mmol/L (ref 3.5–5.1)
Sodium: 140 mmol/L (ref 135–145)
Total Bilirubin: 1 mg/dL (ref 0.3–1.2)
Total Protein: 8.2 g/dL — ABNORMAL HIGH (ref 6.5–8.1)

## 2020-09-14 LAB — URINALYSIS, DIPSTICK ONLY
Glucose, UA: NEGATIVE mg/dL
Hgb urine dipstick: NEGATIVE
Ketones, ur: 5 mg/dL — AB
Nitrite: NEGATIVE
Protein, ur: 30 mg/dL — AB
Specific Gravity, Urine: 1.028 (ref 1.005–1.030)
pH: 5 (ref 5.0–8.0)

## 2020-09-14 MED ORDER — FLUOROURACIL CHEMO INJECTION 2.5 GM/50ML
400.0000 mg/m2 | Freq: Once | INTRAVENOUS | Status: AC
  Administered 2020-09-14: 900 mg via INTRAVENOUS
  Filled 2020-09-14: qty 18

## 2020-09-14 MED ORDER — SODIUM CHLORIDE 0.9 % IV SOLN
2400.0000 mg/m2 | INTRAVENOUS | Status: DC
  Administered 2020-09-14: 5500 mg via INTRAVENOUS
  Filled 2020-09-14: qty 110

## 2020-09-14 MED ORDER — DIPHENHYDRAMINE HCL 50 MG/ML IJ SOLN
50.0000 mg | Freq: Once | INTRAMUSCULAR | Status: AC | PRN
  Administered 2020-09-14: 25 mg via INTRAVENOUS

## 2020-09-14 MED ORDER — DEXTROSE 5 % IV SOLN
87.0000 mg/m2 | Freq: Once | INTRAVENOUS | Status: AC
  Administered 2020-09-14: 200 mg via INTRAVENOUS
  Filled 2020-09-14: qty 40

## 2020-09-14 MED ORDER — FAMOTIDINE IN NACL 20-0.9 MG/50ML-% IV SOLN
20.0000 mg | Freq: Once | INTRAVENOUS | Status: DC
  Filled 2020-09-14 (×2): qty 50

## 2020-09-14 MED ORDER — SODIUM CHLORIDE 0.9 % IV SOLN
Freq: Once | INTRAVENOUS | Status: DC | PRN
  Filled 2020-09-14: qty 250

## 2020-09-14 MED ORDER — PALONOSETRON HCL INJECTION 0.25 MG/5ML
0.2500 mg | Freq: Once | INTRAVENOUS | Status: AC
Start: 2020-09-14 — End: 2020-09-14
  Administered 2020-09-14: 0.25 mg via INTRAVENOUS
  Filled 2020-09-14: qty 5

## 2020-09-14 MED ORDER — DEXTROSE 5 % IV SOLN
Freq: Once | INTRAVENOUS | Status: AC
  Filled 2020-09-14: qty 250

## 2020-09-14 MED ORDER — METHYLPREDNISOLONE SODIUM SUCC 125 MG IJ SOLR
125.0000 mg | Freq: Once | INTRAMUSCULAR | Status: AC | PRN
  Administered 2020-09-14: 125 mg via INTRAVENOUS

## 2020-09-14 MED ORDER — SODIUM CHLORIDE 0.9 % IV SOLN: 5.0000 mg/kg | Freq: Once | INTRAVENOUS | Status: DC

## 2020-09-14 MED ORDER — SODIUM CHLORIDE 0.9% FLUSH
10.0000 mL | Freq: Once | INTRAVENOUS | Status: AC
Start: 2020-09-14 — End: 2020-09-14
  Administered 2020-09-14: 10 mL via INTRAVENOUS
  Filled 2020-09-14: qty 10

## 2020-09-14 MED ORDER — FAMOTIDINE 20 MG IN NS 100 ML IVPB
20.0000 mg | Freq: Once | INTRAVENOUS | Status: DC | PRN
  Administered 2020-09-14: 20 mg via INTRAVENOUS

## 2020-09-14 MED ORDER — SODIUM CHLORIDE 0.9 % IV SOLN
Freq: Once | INTRAVENOUS | Status: AC
  Filled 2020-09-14: qty 250

## 2020-09-14 MED ORDER — LEUCOVORIN CALCIUM INJECTION 350 MG
391.0000 mg/m2 | Freq: Once | INTRAVENOUS | Status: AC
  Administered 2020-09-14: 900 mg via INTRAVENOUS
  Filled 2020-09-14: qty 10

## 2020-09-14 MED ORDER — SODIUM CHLORIDE 0.9 % IV SOLN
5.0000 mg/kg | Freq: Once | INTRAVENOUS | Status: AC
  Administered 2020-09-14: 500 mg via INTRAVENOUS
  Filled 2020-09-14: qty 16

## 2020-09-14 MED ORDER — SODIUM CHLORIDE 0.9 % IV SOLN
10.0000 mg | Freq: Once | INTRAVENOUS | Status: AC
  Administered 2020-09-14: 10 mg via INTRAVENOUS
  Filled 2020-09-14: qty 10

## 2020-09-14 NOTE — Patient Instructions (Signed)
[facility]  Discharge Instructions: Thank you for choosing [facility] to provide your oncology and hematology care.  If you have a lab appointment with the [facility], please go directly to the [facility] and check in at the registration area.  Wear comfortable clothing and clothing appropriate for easy access to any Portacath or PICC line.   We strive to give you quality time with your provider. You may need to reschedule your appointment if you arrive late (15 or more minutes).  Arriving late affects you and other patients whose appointments are after yours.  Also, if you miss three or more appointments without notifying the office, you may be dismissed from the clinic at the provider's discretion.      For prescription refill requests, have your pharmacy contact our office and allow 72 hours for refills to be completed.    Today you received the following chemotherapy and/or immunotherapy agents Zirabev, Oxaliplatin, Leucovorin, & 5FU.      To help prevent nausea and vomiting after your treatment, we encourage you to take your nausea medication as directed.  BELOW ARE SYMPTOMS THAT SHOULD BE REPORTED IMMEDIATELY: *FEVER GREATER THAN 100.4 F (38 C) OR HIGHER *CHILLS OR SWEATING *NAUSEA AND VOMITING THAT IS NOT CONTROLLED WITH YOUR NAUSEA MEDICATION *UNUSUAL SHORTNESS OF BREATH *UNUSUAL BRUISING OR BLEEDING *URINARY PROBLEMS (pain or burning when urinating, or frequent urination) *BOWEL PROBLEMS (unusual diarrhea, constipation, pain near the anus) TENDERNESS IN MOUTH AND THROAT WITH OR WITHOUT PRESENCE OF ULCERS (sore throat, sores in mouth, or a toothache) UNUSUAL RASH, SWELLING OR PAIN  UNUSUAL VAGINAL DISCHARGE OR ITCHING   Items with * indicate a potential emergency and should be followed up as soon as possible or go to the Emergency Department if any problems should occur.  Please show the CHEMOTHERAPY ALERT CARD or  IMMUNOTHERAPY ALERT CARD at check-in to the Emergency Department and triage nurse.  Should you have questions after your visit or need to cancel or reschedule your appointment, please contact [facility]  [phone] and follow the prompts.  Office hours are 8:00 a.m. to 4:30 p.m. Monday - Friday. Please note that voicemails left after 4:00 p.m. may not be returned until the following business day.  We are closed weekends and major holidays. You have access to a nurse at all times for urgent questions. Please call the main number to the clinic [phone] and follow the prompts.  For any non-urgent questions, you may also contact your provider using MyChart. We now offer e-Visits for anyone 71 and older to request care online for non-urgent symptoms. For details visit [email or web address].   Also download the MyChart app! Go to the app store, search "MyChart", open the app, select [facility], and log in with your MyChart username and password.  Due to Covid, a mask is required upon entering the hospital/clinic. If you do not have a mask, one will be given to you upon arrival. For doctor visits, patients may have 1 support person aged 18 or older with them. For treatment visits, patients cannot have anyone with them due to current Covid guidelines and our immunocompromised population.

## 2020-09-14 NOTE — Progress Notes (Signed)
Patient reports decline in appetite and has a 6 lb wt loss since last documented wt.  Also noticed SOBr on exertion for the past month.

## 2020-09-14 NOTE — Progress Notes (Signed)
Nutrition Assessment   Reason for Assessment:  Poor appetite, weight loss   ASSESSMENT:  65 year old female with stage IV adenocarcinoma of colon with peritoneal carcinomatosis.  Patient receiving folfox and zirabev.    Met with patient during infusion.  Patient reports decreased appetite, taste and smell alterations, constipation.  Patient reports nausea is better but had nausea for few days following last treatment.  Reports yesterday able to drink ensure original and couldn't remember anything else she was able to eat.  Says that she takes a few bites and feels full.  Has not had a bowel movement since Friday, 6/24.  Typically has bowel movement daily.  Reports poor appetite for the last month.     Medications: zofran, compazine, prilsec, bentyl   Labs: reviewed   Anthropometrics:   Height: 66 inches Weight: 228 lb 11.2 oz today 263 lb on 2/1 BMI: 36  13% weight loss in the last 4 months, significant  Estimated Energy Needs  Kcals: 2500-3000 Protein: 125-150 g Fluid: 2.5 L   NUTRITION DIAGNOSIS: Inadequate oral intake related to cancer and cancer related treatment side effects as evidenced by 13% weight loss and poor appetite for the last month   INTERVENTION:  Spoke with Dr Grayland Ormond regarding lack of bowel movement and wanted patient to try over the counter stool softner or miralax.  Written down and discussed with patient.   Encouraged increased fluid to help with constipation Discussed small frequent nibbles, setting reminder to eat Discussed taste and smell alterations. Handout given Discussed high calorie shakes and samples of ensure complete and boost plus given to patient along with coupons Contact information given   MONITORING, EVALUATION, GOAL: weight trends, intake   Next Visit: Tuesday, July 12th during infusion  Percilla Tweten B. Zenia Resides, [place], [place] Registered Dietitian [phone] (mobile)

## 2020-09-14 NOTE — Progress Notes (Signed)
1421 Patient completed treatment, stood to leave and felt unsteady on feet. Having cramping in legs and feels like throat is closing up. See VS flowsheet.  [place]  Dr Grayland Ormond notified, IVF started. Benadryl 25 mg IV given.  1423 Solu-Medrol 125 mg IV given.  9 Dr Grayland Ormond at chairside, possible delayed Oxaliplatin reaction, plan to change to irinotecan next treatment.  Ok to reconnect 5FU pump when symptoms improve.  1426 Pepcid 20 mg IV started, patient states symptoms are slightly better  1437 Patient states that symptoms have resolved and she feels back to normal.

## 2020-09-15 ENCOUNTER — Encounter: Payer: Self-pay | Admitting: Oncology

## 2020-09-16 ENCOUNTER — Inpatient Hospital Stay: Payer: Medicare Other

## 2020-09-16 ENCOUNTER — Other Ambulatory Visit: Payer: Self-pay

## 2020-09-16 VITALS — BP 146/76 | HR 91 | Temp 96.0°F | Resp 19

## 2020-09-16 DIAGNOSIS — C184 Malignant neoplasm of transverse colon: Secondary | ICD-10-CM

## 2020-09-16 DIAGNOSIS — C19 Malignant neoplasm of rectosigmoid junction: Secondary | ICD-10-CM | POA: Diagnosis not present

## 2020-09-16 DIAGNOSIS — R0602 Shortness of breath: Secondary | ICD-10-CM | POA: Diagnosis not present

## 2020-09-16 MED ORDER — HEPARIN SOD (PORK) LOCK FLUSH 100 UNIT/ML IV SOLN
INTRAVENOUS | Status: AC
  Filled 2020-09-16: qty 5

## 2020-09-16 MED ORDER — SODIUM CHLORIDE 0.9% FLUSH
10.0000 mL | INTRAVENOUS | Status: DC | PRN
  Administered 2020-09-16: 10 mL
  Filled 2020-09-16: qty 10

## 2020-09-16 MED ORDER — HEPARIN SOD (PORK) LOCK FLUSH 100 UNIT/ML IV SOLN
500.0000 [IU] | Freq: Once | INTRAVENOUS | Status: AC | PRN
  Administered 2020-09-16: 500 [IU]
  Filled 2020-09-16: qty 5

## 2020-09-16 NOTE — Progress Notes (Signed)
Per pt she has been having cramping in her hands/legs, shaky, and having a feeling of nervousness since her last treatment on 09/14/20 . VSS. MD and NP notified. New appointments made for pt to be seen by NP on 09/17/20. Pt updated of new appointments. Pt stable for discharge.   Ellianne Gowen [employer]

## 2020-09-16 NOTE — Patient Instructions (Signed)
[facility]  Discharge Instructions: Thank you for choosing [facility] to provide your oncology and hematology care.  If you have a lab appointment with the Cancer Center, please go directly to the Cancer Center and check in at the registration area.  Wear comfortable clothing and clothing appropriate for easy access to any Portacath or PICC line.   We strive to give you quality time with your provider. You may need to reschedule your appointment if you arrive late (15 or more minutes).  Arriving late affects you and other patients whose appointments are after yours.  Also, if you miss three or more appointments without notifying the office, you may be dismissed from the clinic at the provider's discretion.      For prescription refill requests, have your pharmacy contact our office and allow 72 hours for refills to be completed.      To help prevent nausea and vomiting after your treatment, we encourage you to take your nausea medication as directed.  BELOW ARE SYMPTOMS THAT SHOULD BE REPORTED IMMEDIATELY: *FEVER GREATER THAN 100.4 F (38 C) OR HIGHER *CHILLS OR SWEATING *NAUSEA AND VOMITING THAT IS NOT CONTROLLED WITH YOUR NAUSEA MEDICATION *UNUSUAL SHORTNESS OF BREATH *UNUSUAL BRUISING OR BLEEDING *URINARY PROBLEMS (pain or burning when urinating, or frequent urination) *BOWEL PROBLEMS (unusual diarrhea, constipation, pain near the anus) TENDERNESS IN MOUTH AND THROAT WITH OR WITHOUT PRESENCE OF ULCERS (sore throat, sores in mouth, or a toothache) UNUSUAL RASH, SWELLING OR PAIN  UNUSUAL VAGINAL DISCHARGE OR ITCHING   Items with * indicate a potential emergency and should be followed up as soon as possible or go to the Emergency Department if any problems should occur.  Please show the CHEMOTHERAPY ALERT CARD or IMMUNOTHERAPY ALERT CARD at check-in to the Emergency Department and triage nurse.  Should you have questions after your  visit or need to cancel or reschedule your appointment, please contact [facility]  [phone] and follow the prompts.  Office hours are 8:00 a.m. to 4:30 p.m. Monday - Friday. Please note that voicemails left after 4:00 p.m. may not be returned until the following business day.  We are closed weekends and major holidays. You have access to a nurse at all times for urgent questions. Please call the main number to the clinic [phone] and follow the prompts.  For any non-urgent questions, you may also contact your provider using MyChart. We now offer e-Visits for anyone 18 and older to request care online for non-urgent symptoms. For details visit [email or web address].   Also download the MyChart app! Go to the app store, search "MyChart", open the app, select [facility], and log in with your MyChart username and password.  Due to Covid, a mask is required upon entering the hospital/clinic. If you do not have a mask, one will be given to you upon arrival. For doctor visits, patients may have 1 support person aged 18 or older with them. For treatment visits, patients cannot have anyone with them due to current Covid guidelines and our immunocompromised population.  

## 2020-09-17 ENCOUNTER — Inpatient Hospital Stay (HOSPITAL_BASED_OUTPATIENT_CLINIC_OR_DEPARTMENT_OTHER): Payer: Medicare Other | Admitting: Nurse Practitioner

## 2020-09-17 ENCOUNTER — Inpatient Hospital Stay: Payer: Medicare Other

## 2020-09-17 ENCOUNTER — Inpatient Hospital Stay: Payer: Medicare Other | Attending: Oncology

## 2020-09-17 ENCOUNTER — Encounter: Payer: Self-pay | Admitting: Nurse Practitioner

## 2020-09-17 ENCOUNTER — Other Ambulatory Visit: Payer: Self-pay | Admitting: Oncology

## 2020-09-17 ENCOUNTER — Other Ambulatory Visit: Payer: Self-pay | Admitting: Nurse Practitioner

## 2020-09-17 VITALS — BP 122/68 | HR 93 | Temp 98.0°F | Resp 16 | Wt 229.9 lb

## 2020-09-17 DIAGNOSIS — R000 Tachycardia, unspecified: Secondary | ICD-10-CM | POA: Diagnosis not present

## 2020-09-17 DIAGNOSIS — E8809 Other disorders of plasma-protein metabolism, not elsewhere classified: Secondary | ICD-10-CM | POA: Diagnosis not present

## 2020-09-17 DIAGNOSIS — Z5111 Encounter for antineoplastic chemotherapy: Secondary | ICD-10-CM | POA: Diagnosis not present

## 2020-09-17 DIAGNOSIS — E876 Hypokalemia: Secondary | ICD-10-CM | POA: Diagnosis not present

## 2020-09-17 DIAGNOSIS — Z79899 Other long term (current) drug therapy: Secondary | ICD-10-CM | POA: Diagnosis not present

## 2020-09-17 DIAGNOSIS — C482 Malignant neoplasm of peritoneum, unspecified: Secondary | ICD-10-CM | POA: Diagnosis present

## 2020-09-17 DIAGNOSIS — C189 Malignant neoplasm of colon, unspecified: Secondary | ICD-10-CM

## 2020-09-17 DIAGNOSIS — R252 Cramp and spasm: Secondary | ICD-10-CM

## 2020-09-17 DIAGNOSIS — C785 Secondary malignant neoplasm of large intestine and rectum: Secondary | ICD-10-CM | POA: Insufficient documentation

## 2020-09-17 DIAGNOSIS — D72819 Decreased white blood cell count, unspecified: Secondary | ICD-10-CM | POA: Insufficient documentation

## 2020-09-17 DIAGNOSIS — C184 Malignant neoplasm of transverse colon: Secondary | ICD-10-CM

## 2020-09-17 LAB — COMPREHENSIVE METABOLIC PANEL
ALT: 14 U/L (ref 0–44)
AST: 39 U/L (ref 15–41)
Albumin: 3.4 g/dL — ABNORMAL LOW (ref 3.5–5.0)
Alkaline Phosphatase: 80 U/L (ref 38–126)
Anion gap: 11 (ref 5–15)
BUN: 28 mg/dL — ABNORMAL HIGH (ref 8–23)
CO2: 32 mmol/L (ref 22–32)
Calcium: 8.3 mg/dL — ABNORMAL LOW (ref 8.9–10.3)
Chloride: 93 mmol/L — ABNORMAL LOW (ref 98–111)
Creatinine, Ser: 1.37 mg/dL — ABNORMAL HIGH (ref 0.44–1.00)
GFR, Estimated: 43 mL/min — ABNORMAL LOW (ref >60)
Glucose, Bld: 130 mg/dL — ABNORMAL HIGH (ref 70–99)
Potassium: 3 mmol/L — ABNORMAL LOW (ref 3.5–5.1)
Sodium: 136 mmol/L (ref 135–145)
Total Bilirubin: 0.8 mg/dL (ref 0.3–1.2)
Total Protein: 7.8 g/dL (ref 6.5–8.1)

## 2020-09-17 LAB — CBC WITH DIFFERENTIAL/PLATELET
Abs Immature Granulocytes: 0 10*3/uL (ref 0.00–0.07)
Basophils Absolute: 0 10*3/uL (ref 0.0–0.1)
Basophils Relative: 1 %
Eosinophils Absolute: 0.1 10*3/uL (ref 0.0–0.5)
Eosinophils Relative: 3 %
HCT: 36.8 % (ref 36.0–46.0)
Hemoglobin: 11.5 g/dL — ABNORMAL LOW (ref 12.0–15.0)
Immature Granulocytes: 0 %
Lymphocytes Relative: 59 %
Lymphs Abs: 1.1 10*3/uL (ref 0.7–4.0)
MCH: 24.4 pg — ABNORMAL LOW (ref 26.0–34.0)
MCHC: 31.3 g/dL (ref 30.0–36.0)
MCV: 78.1 fL — ABNORMAL LOW (ref 80.0–100.0)
Monocytes Absolute: 0.2 10*3/uL (ref 0.1–1.0)
Monocytes Relative: 10 %
Neutro Abs: 0.5 10*3/uL — ABNORMAL LOW (ref 1.7–7.7)
Neutrophils Relative %: 27 %
Platelets: 225 10*3/uL (ref 150–400)
RBC: 4.71 MIL/uL (ref 3.87–5.11)
RDW: 15.7 % — ABNORMAL HIGH (ref 11.5–15.5)
WBC: 1.8 10*3/uL — ABNORMAL LOW (ref 4.0–10.5)
nRBC: 0 % (ref 0.0–0.2)

## 2020-09-17 LAB — IRON AND TIBC
Iron: 96 ug/dL (ref 28–170)
Saturation Ratios: 34 % — ABNORMAL HIGH (ref 10.4–31.8)
TIBC: 286 ug/dL (ref 250–450)
UIBC: 190 ug/dL

## 2020-09-17 LAB — FERRITIN: Ferritin: 399 ng/mL — ABNORMAL HIGH (ref 11–307)

## 2020-09-17 LAB — MAGNESIUM: Magnesium: 2.1 mg/dL (ref 1.7–2.4)

## 2020-09-17 MED ORDER — HEPARIN SOD (PORK) LOCK FLUSH 100 UNIT/ML IV SOLN
500.0000 [IU] | Freq: Once | INTRAVENOUS | Status: AC
  Administered 2020-09-17: 500 [IU] via INTRAVENOUS
  Filled 2020-09-17: qty 5

## 2020-09-17 MED ORDER — SODIUM CHLORIDE 0.9% FLUSH
10.0000 mL | Freq: Once | INTRAVENOUS | Status: AC
  Administered 2020-09-17: 10 mL via INTRAVENOUS
  Filled 2020-09-17: qty 10

## 2020-09-17 MED ORDER — POTASSIUM CHLORIDE CRYS ER 10 MEQ PO TBCR: 20.0000 meq | EXTENDED_RELEASE_TABLET | Freq: Every day | ORAL | 0 refills | Status: DC

## 2020-09-17 NOTE — Progress Notes (Signed)
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 Symptoms started two days ago and persisted through her treatment yesterday. Symptoms have now resolved. Otherwise feels tired but at baseline. Denies any neurologic complaints. Denies recent fevers or illnesses. Denies any easy bleeding or bruising. Reports good appetite and denies weight loss. Denies chest pain. Denies any nausea, vomiting, constipation, or diarrhea. Denies urinary complaints. Patient offers no further specific complaints today.  Review of systems- Review of Systems  Constitutional:  Positive for malaise/fatigue. Negative for chills, fever and weight loss.  HENT:  Negative for hearing loss, nosebleeds, sore throat and tinnitus.   Eyes:  Negative for blurred vision and double vision.  Respiratory:  Negative for cough,  hemoptysis, shortness of breath and wheezing.   Cardiovascular:  Negative for chest pain, palpitations and leg swelling.  Gastrointestinal:  Negative for abdominal pain, blood in stool, constipation, diarrhea, melena, nausea and vomiting.  Genitourinary:  Negative for dysuria and urgency.  Musculoskeletal:  Negative for back pain, falls, joint pain and myalgias.  Skin:  Negative for itching and rash.  Neurological:  Negative for dizziness, tingling, sensory change, loss of consciousness, weakness and headaches.  Endo/Heme/Allergies:  Negative for environmental allergies. Does not bruise/bleed easily.  Psychiatric/Behavioral:  Negative for depression. The patient is not nervous/anxious and does not have insomnia.      Allergies  Allergen Reactions   Erythromycin Itching    Past Medical History:  Diagnosis Date   Arthritis    Chicken pox    Hyperlipidemia    Hypertension    Obesity    Sleep apnea    Thyroid disease     Past Surgical History:  Procedure Laterality Date   ABDOMINAL HYSTERECTOMY  12/2009   total   COLONOSCOPY WITH PROPOFOL N/A 08/13/2020   Procedure: COLONOSCOPY WITH PROPOFOL;  Surgeon: Jonathon Bellows, MD;  Location: [facility] ENDOSCOPY;  Service: Gastroenterology;  Laterality: N/A;   ESOPHAGOGASTRODUODENOSCOPY (EGD) WITH PROPOFOL N/A 08/13/2020   Procedure: ESOPHAGOGASTRODUODENOSCOPY (EGD) WITH PROPOFOL;  Surgeon: Jonathon Bellows, MD;  Location: [facility] ENDOSCOPY;  Service: Gastroenterology;  Laterality: N/A;   IR IMAGING GUIDED PORT INSERTION  08/27/2020   supracervical abdominal hysterectomy and bilateral salpingo--oophorectomy 01-17-2010 for fibroids  01/17/2010   TOTAL THYROIDECTOMY  1991-92   TUBAL LIGATION      Social History   Socioeconomic History   Marital status: Divorced    Spouse name: Not on file  Number of children: Not on file   Years of education: Not on file   Highest education level: Not on file  Occupational History   Not on file  Tobacco Use    Smoking status: Never   Smokeless tobacco: Never  Vaping Use   Vaping Use: Never used  Substance and Sexual Activity   Alcohol use: No    Alcohol/week: 0.0 standard drinks   Drug use: No   Sexual activity: Yes  Other Topics Concern   Not on file  Social History Narrative   Not on file   Social Determinants of Health   Financial Resource Strain: Not on file  Food Insecurity: Not on file  Transportation Needs: Not on file  Physical Activity: Not on file  Stress: Not on file  Social Connections: Not on file  Intimate Partner Violence: Not on file    Family History  Problem Relation Age of Onset   Stroke Father    Diabetes Maternal Grandmother    Hypertension Maternal Grandmother    Diabetes Maternal Uncle    Cancer Maternal Aunt        Breast     Current Outpatient Medications:    atorvastatin (LIPITOR) 10 MG tablet, TAKE 1 TABLET(10 MG) BY MOUTH DAILY, Disp: 90 tablet, Rfl: 0   benazepril (LOTENSIN) 20 MG tablet, TAKE 1 TABLET BY MOUTH DAILY, Disp: 90 tablet, Rfl: 2   bismuth subsalicylate (PEPTO-BISMOL) 262 MG/15ML suspension, Take 30 mLs by mouth every 4 (four) hours as needed. Please provide enough ml to last her for 14 days. Thank you., Disp: 524 mL, Rfl: 0   clobetasol ointment (TEMOVATE) 0.05 %, Apply to affected area every night for 4 weeks, then every other day for 4 weeks and then twice a week for 4 weeks or until resolution., Disp: 30 g, Rfl: 5   hydrochlorothiazide (HYDRODIURIL) 25 MG tablet, Take 1 tablet (25 mg total) by mouth daily., Disp: 90 tablet, Rfl: 3   levothyroxine (SYNTHROID) 150 MCG tablet, Take 1 tablet (150 mcg total) by mouth daily., Disp: 90 tablet, Rfl: 3   lidocaine-prilocaine (EMLA) cream, Apply to affected area once, Disp: 30 g, Rfl: 3   omeprazole (PRILOSEC) 20 MG capsule, Take 1 capsule (20 mg total) by mouth 2 (two) times daily before a meal., Disp: 28 capsule, Rfl: 0   ondansetron (ZOFRAN) 8 MG tablet, Take 1 tablet (8 mg total) by mouth  2 (two) times daily as needed for refractory nausea / vomiting., Disp: 60 tablet, Rfl: 1   oxyCODONE-acetaminophen (PERCOCET/ROXICET) 5-325 MG tablet, Take 1 tablet by mouth every 4 (four) hours as needed for severe pain., Disp: 30 tablet, Rfl: 0   dicyclomine (BENTYL) 10 MG capsule, Take 1 capsule (10 mg total) by mouth in the morning, at noon, in the evening, and at bedtime. (Patient not taking: No sig reported), Disp: 90 capsule, Rfl: 3   doxycycline (MONODOX) 100 MG capsule, Take 1 capsule (100 mg total) by mouth 2 (two) times daily. (Patient not taking: No sig reported), Disp: 28 capsule, Rfl: 0   metroNIDAZOLE (FLAGYL) 500 MG tablet, Take 1 tablet (500 mg total) by mouth 2 (two) times daily. (Patient not taking: No sig reported), Disp: 28 tablet, Rfl: 0   prochlorperazine (COMPAZINE) 10 MG tablet, Take 1 tablet (10 mg total) by mouth every 6 (six) hours as needed (Nausea or vomiting)., Disp: 60 tablet, Rfl: 1 No current facility-administered medications for this visit.  Facility-Administered Medications Ordered in Other Visits:  heparin lock flush 100 unit/mL, 500 Units, Intravenous, Once, Verlon Au, NP  Physical exam:  Vitals:   09/17/20 0915  BP: 122/68  Pulse: 93  Resp: 16  Temp: 98 F (36.7 C)  Weight: 229 lb 14.4 oz (104.3 kg)   General: Well-developed, well-nourished, no acute distress. Eyes: Pink conjunctiva, anicteric sclera. Lungs: Clear to auscultation bilaterally.  No audible wheezing or coughing Heart: Regular rate and rhythm.  Abdomen: Soft, nontender, nondistended.  Musculoskeletal: No edema, cyanosis, or clubbing. Neuro: Alert, answering all questions appropriately. Cranial nerves grossly intact. Skin: No rashes or petechiae noted. Psych: Normal affect.    CMP Latest Ref Rng & Units 09/17/2020  Glucose 70 - 99 mg/dL 130(H)  BUN 8 - 23 mg/dL 28(H)  Creatinine 0.44 - 1.00 mg/dL 1.37(H)  Sodium 135 - 145 mmol/L 136  Potassium 3.5 - 5.1 mmol/L 3.0(L)   Chloride 98 - 111 mmol/L 93(L)  CO2 22 - 32 mmol/L 32  Calcium 8.9 - 10.3 mg/dL 8.3(L)  Total Protein 6.5 - 8.1 g/dL 7.8  Total Bilirubin 0.3 - 1.2 mg/dL 0.8  Alkaline Phos 38 - 126 U/L 80  AST 15 - 41 U/L 39  ALT 0 - 44 U/L 14   CBC Latest Ref Rng & Units 09/17/2020  WBC 4.0 - 10.5 K/uL 1.8(L)  Hemoglobin 12.0 - 15.0 g/dL 11.5(L)  Hematocrit 36.0 - 46.0 % 36.8  Platelets 150 - 400 K/uL 225    No images are attached to the encounter.  NM PET Image Initial (PI) Skull Base To Thigh  Result Date: 08/25/2020 CLINICAL DATA:  Subsequent treatment strategy for colorectal carcinoma. EXAM: NUCLEAR MEDICINE PET SKULL BASE TO THIGH TECHNIQUE: 13.1 mCi F-18 FDG was injected intravenously. Full-ring PET imaging was performed from the skull base to thigh after the radiotracer. CT data was obtained and used for attenuation correction and anatomic localization. Fasting blood glucose: 101 mg/dl COMPARISON:  CT 08/02/2020 FINDINGS: Mediastinal blood pool activity: SUV max 2.9 Liver activity: SUV max NA NECK: No hypermetabolic lymph nodes in the neck. Incidental CT findings: none CHEST: Large layering LEFT pleural effusion is new from comparison exam. No hypermetabolic mediastinal lymph nodes. No suspicious pulmonary nodules. Incidental CT findings: There is subareolar mass in the LEFT breast measuring 1.9 cm without metabolic activity. A similar mass in the RIGHT breast measuring 3.7 cm ABDOMEN/PELVIS: peritoneal thickening in the lesser omentum measures 4.4 by 3.0 cm on image 162 with intense metabolic activity (SUV max equal 15). The hypermetabolic activity extends extends to involve a 7 cm segment of transverse colon which is also hypermetabolic with SUV max equal 20.7 (image 168) There is a rim metabolic activity surrounding the RIGHT hepatic lobe consistent with peritoneal metastasis (SUV max equal 6.2). Scattered omental metastasis along the ventral peritoneal surface. For example midline the level the  umbilicus with SUV max equal 12.6. There is heterogeneous fat stranding at this level (image 189). There is hypermetabolic activity along the peritoneal reflections in the deep pelvis associated the small free fluid with SUV max equal 10.9. Lobular mass in the deep pelvis posterior to the bladder without significant metabolic activity is favored the uterus (4.5 cm image 225) Incidental CT findings: No bowel obstruction. No intraperitoneal free air SKELETON: No focal hypermetabolic activity to suggest skeletal metastasis. Incidental CT findings: none IMPRESSION: 1. Extensive omental and peritoneal hypermetabolic metastasis in the abdomen pelvis. 2. Hypermetabolic nodular thickening in the greater and lesser omentum. 3. Hypermetabolic circumferential thickening through the mid transverse colon. 4.  Rim of hypointensity surrounding the liver as well as the deep peritoneal pelvis. 5. Mass within the deep pelvis is favor the uterus. 6. New large LEFT pleural effusion. No evidence thoracic metastasis. 7. Soft tissue densities in the subareolar space on LEFT and RIGHT are favored benign breast lesions. Electronically Signed   By: Suzy Bouchard M.D.   On: 08/25/2020 17:04   IR IMAGING GUIDED PORT INSERTION  Result Date: 08/27/2020 INDICATION: Metastatic colorectal malignancy EXAM: IMPLANTED PORT A CATH PLACEMENT WITH ULTRASOUND AND FLUOROSCOPIC GUIDANCE MEDICATIONS: None ANESTHESIA/SEDATION: Moderate (conscious) sedation was employed during this procedure. A total of Versed 2 mg and Fentanyl 100 mcg was administered intravenously. Moderate Sedation Time: 15 minutes. The patient's level of consciousness and vital signs were monitored continuously by radiology nursing throughout the procedure under my direct supervision. FLUOROSCOPY TIME:  0.3 minutes, (9.14 mGy) COMPLICATIONS: None immediate. PROCEDURE: The procedure, risks, benefits, and alternatives were explained to the patient. Questions regarding the procedure  were encouraged and answered. The patient understands and consents to the procedure. A timeout was performed prior to the initiation of the procedure. Patient positioned supine on the angiography table. Right neck and anterior upper chest prepped and draped in the usual sterile fashion. All elements of maximal sterile barrier were utilized including, cap, mask, sterile gown, sterile gloves, large sterile drape, hand scrubbing and 2% Chlorhexidine for skin cleaning. The right internal jugular vein was evaluated with ultrasound and shown to be patent. A permanent ultrasound image was obtained and placed in the patient's medical record. Local anesthesia was provided with 1% lidocaine with epinephrine. Using sterile gel and a sterile probe cover, the right internal jugular vein was entered with a 21 ga needle during real time ultrasound guidance. 0.018 inch guidewire placed and 21 ga needle exchanged for transitional dilator set. Utilizing fluoroscopy, 0.035 inch guidewire advanced through the needle without difficulty. Attention then turned to the right anterior upper chest. Following local lidocaine administration, a port pocket was created. The catheter was connected to the port and brought from the pocket to the venotomy site through a subcutaneous tunnel. The catheter was cut to size and inserted through the peel-away sheath. The catheter tip was positioned at the cavoatrial junction using fluoroscopic guidance. The port aspirated and flushed well. The port pocket was closed with deep and superficial absorbable suture. The port pocket incision and venotomy sites were also sealed with Dermabond. IMPRESSION: Successful placement of a right internal jugular approach power injectable Port-A-Cath. The catheter is ready for immediate use. Electronically Signed   By: Miachel Roux M.D.   On: 08/27/2020 13:25    Assessment and plan- Patient is a 65 y.o. 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 Etiology unclear but symptoms have now resolved. Discussed most likely etiologies are electrolyte disarrangements vs iron deficiency. Labs pending today but cmp has resulted. Potassium is decreased at 3.0. No GI losses. Start KCl 20 meq daily. Will send 10 meq tablets due to difficulty swallowing larger pills/patient preference.   RTC prn. Follow up with Dr. Grayland Ormond as scheduled.    Visit Diagnosis 1. Metastatic colon cancer in female [facility])     Patient expressed understanding and was in agreement with this plan. She also understands that She can call clinic at any time with any questions, concerns, or complaints.   Thank you for allowing me to participate in  the care of this very pleasant patient.   Beckey Rutter, DNP, AGNP-C [facility]

## 2020-09-17 NOTE — Progress Notes (Signed)
Yesterday patient was having periodical cramping in her legs and arms that has improved today.  Appetite has decreased.

## 2020-09-17 NOTE — Progress Notes (Signed)
DISCONTINUE ON PATHWAY REGIMEN - Colorectal     A cycle is every 14 days:     Bevacizumab-xxxx      Oxaliplatin      Leucovorin      Fluorouracil      Fluorouracil   **Always confirm dose/schedule in your pharmacy ordering system**  REASON: Toxicities / Adverse Event PRIOR TREATMENT: [id number]: mFOLFOX6 + Bevacizumab q14 Days TREATMENT RESPONSE: Unable to Evaluate  START ON PATHWAY REGIMEN - Colorectal     A cycle is every 14 days:     Bevacizumab-xxxx      Irinotecan      Leucovorin      Fluorouracil      Fluorouracil   **Always confirm dose/schedule in your pharmacy ordering system**  Patient Characteristics: Distant Metastases, Nonsurgical Candidate, KRAS/NRAS Mutation Positive/Unknown (BRAF V600 Wild-Type/Unknown), Standard Cytotoxic Therapy, First Line Standard Cytotoxic Therapy, Bevacizumab Eligible, PS = 0,1 Tumor Location: Colon Therapeutic Status: Distant Metastases Microsatellite/Mismatch Repair Status: Unknown BRAF Mutation Status: Awaiting Test Results KRAS/NRAS Mutation Status: Awaiting Test Results Standard Cytotoxic Line of Therapy: First Line Standard Cytotoxic Therapy ECOG Performance Status: 0 Bevacizumab Eligibility: Eligible Intent of Therapy: Non-Curative / Palliative Intent, Discussed with Patient

## 2020-09-18 ENCOUNTER — Inpatient Hospital Stay
Admission: EM | Admit: 2020-09-18 | Discharge: 2020-09-22 | DRG: 374 | Disposition: A | Discharge status: Home / Self Care | Payer: Medicare Other | Attending: Internal Medicine | Admitting: Internal Medicine

## 2020-09-18 ENCOUNTER — Emergency Department: Payer: Medicare Other

## 2020-09-18 ENCOUNTER — Other Ambulatory Visit: Payer: Self-pay

## 2020-09-18 ENCOUNTER — Encounter: Payer: Self-pay | Admitting: Pulmonary Disease

## 2020-09-18 DIAGNOSIS — E876 Hypokalemia: Secondary | ICD-10-CM | POA: Diagnosis not present

## 2020-09-18 DIAGNOSIS — Z79899 Other long term (current) drug therapy: Secondary | ICD-10-CM

## 2020-09-18 DIAGNOSIS — Z7901 Long term (current) use of anticoagulants: Secondary | ICD-10-CM | POA: Diagnosis not present

## 2020-09-18 DIAGNOSIS — D630 Anemia in neoplastic disease: Secondary | ICD-10-CM | POA: Diagnosis present

## 2020-09-18 DIAGNOSIS — Z8249 Family history of ischemic heart disease and other diseases of the circulatory system: Secondary | ICD-10-CM

## 2020-09-18 DIAGNOSIS — Z20822 Contact with and (suspected) exposure to covid-19: Secondary | ICD-10-CM | POA: Diagnosis present

## 2020-09-18 DIAGNOSIS — Z90710 Acquired absence of both cervix and uterus: Secondary | ICD-10-CM | POA: Diagnosis not present

## 2020-09-18 DIAGNOSIS — Z79890 Hormone replacement therapy (postmenopausal): Secondary | ICD-10-CM

## 2020-09-18 DIAGNOSIS — E890 Postprocedural hypothyroidism: Secondary | ICD-10-CM | POA: Diagnosis present

## 2020-09-18 DIAGNOSIS — Z823 Family history of stroke: Secondary | ICD-10-CM

## 2020-09-18 DIAGNOSIS — N179 Acute kidney failure, unspecified: Secondary | ICD-10-CM | POA: Diagnosis not present

## 2020-09-18 DIAGNOSIS — R509 Fever, unspecified: Secondary | ICD-10-CM

## 2020-09-18 DIAGNOSIS — Z881 Allergy status to other antibiotic agents status: Secondary | ICD-10-CM

## 2020-09-18 DIAGNOSIS — T451X5A Adverse effect of antineoplastic and immunosuppressive drugs, initial encounter: Secondary | ICD-10-CM | POA: Diagnosis present

## 2020-09-18 DIAGNOSIS — C786 Secondary malignant neoplasm of retroperitoneum and peritoneum: Secondary | ICD-10-CM | POA: Diagnosis present

## 2020-09-18 DIAGNOSIS — E669 Obesity, unspecified: Secondary | ICD-10-CM | POA: Diagnosis present

## 2020-09-18 DIAGNOSIS — D701 Agranulocytosis secondary to cancer chemotherapy: Secondary | ICD-10-CM | POA: Diagnosis present

## 2020-09-18 DIAGNOSIS — Z833 Family history of diabetes mellitus: Secondary | ICD-10-CM | POA: Diagnosis not present

## 2020-09-18 DIAGNOSIS — C19 Malignant neoplasm of rectosigmoid junction: Principal | ICD-10-CM | POA: Diagnosis present

## 2020-09-18 DIAGNOSIS — G4733 Obstructive sleep apnea (adult) (pediatric): Secondary | ICD-10-CM | POA: Diagnosis present

## 2020-09-18 DIAGNOSIS — I2692 Saddle embolus of pulmonary artery without acute cor pulmonale: Secondary | ICD-10-CM | POA: Diagnosis present

## 2020-09-18 DIAGNOSIS — C482 Malignant neoplasm of peritoneum, unspecified: Secondary | ICD-10-CM | POA: Diagnosis not present

## 2020-09-18 DIAGNOSIS — D696 Thrombocytopenia, unspecified: Secondary | ICD-10-CM | POA: Diagnosis not present

## 2020-09-18 DIAGNOSIS — I10 Essential (primary) hypertension: Secondary | ICD-10-CM | POA: Diagnosis present

## 2020-09-18 DIAGNOSIS — J9601 Acute respiratory failure with hypoxia: Secondary | ICD-10-CM | POA: Diagnosis present

## 2020-09-18 DIAGNOSIS — C7989 Secondary malignant neoplasm of other specified sites: Secondary | ICD-10-CM | POA: Diagnosis present

## 2020-09-18 DIAGNOSIS — E785 Hyperlipidemia, unspecified: Secondary | ICD-10-CM | POA: Diagnosis present

## 2020-09-18 DIAGNOSIS — C785 Secondary malignant neoplasm of large intestine and rectum: Secondary | ICD-10-CM | POA: Diagnosis not present

## 2020-09-18 DIAGNOSIS — D72818 Other decreased white blood cell count: Secondary | ICD-10-CM | POA: Diagnosis not present

## 2020-09-18 DIAGNOSIS — J90 Pleural effusion, not elsewhere classified: Secondary | ICD-10-CM | POA: Diagnosis present

## 2020-09-18 DIAGNOSIS — I824Y2 Acute embolism and thrombosis of unspecified deep veins of left proximal lower extremity: Secondary | ICD-10-CM | POA: Diagnosis not present

## 2020-09-18 DIAGNOSIS — I824Y9 Acute embolism and thrombosis of unspecified deep veins of unspecified proximal lower extremity: Secondary | ICD-10-CM | POA: Diagnosis not present

## 2020-09-18 DIAGNOSIS — I2699 Other pulmonary embolism without acute cor pulmonale: Secondary | ICD-10-CM | POA: Diagnosis present

## 2020-09-18 DIAGNOSIS — R0602 Shortness of breath: Secondary | ICD-10-CM | POA: Diagnosis present

## 2020-09-18 DIAGNOSIS — K2970 Gastritis, unspecified, without bleeding: Secondary | ICD-10-CM | POA: Diagnosis present

## 2020-09-18 DIAGNOSIS — Z809 Family history of malignant neoplasm, unspecified: Secondary | ICD-10-CM

## 2020-09-18 DIAGNOSIS — B9681 Helicobacter pylori [H. pylori] as the cause of diseases classified elsewhere: Secondary | ICD-10-CM | POA: Diagnosis present

## 2020-09-18 DIAGNOSIS — D649 Anemia, unspecified: Secondary | ICD-10-CM | POA: Diagnosis not present

## 2020-09-18 DIAGNOSIS — I2602 Saddle embolus of pulmonary artery with acute cor pulmonale: Secondary | ICD-10-CM | POA: Diagnosis not present

## 2020-09-18 HISTORY — DX: Malignant neoplasm of colon, unspecified: C18.9

## 2020-09-18 HISTORY — DX: Helicobacter pylori (H. pylori) as the cause of diseases classified elsewhere: B96.81

## 2020-09-18 LAB — COMPREHENSIVE METABOLIC PANEL
ALT: 13 U/L (ref 0–44)
AST: 35 U/L (ref 15–41)
Albumin: 3.6 g/dL (ref 3.5–5.0)
Alkaline Phosphatase: 84 U/L (ref 38–126)
Anion gap: 12 (ref 5–15)
BUN: 29 mg/dL — ABNORMAL HIGH (ref 8–23)
CO2: 31 mmol/L (ref 22–32)
Calcium: 8.4 mg/dL — ABNORMAL LOW (ref 8.9–10.3)
Chloride: 93 mmol/L — ABNORMAL LOW (ref 98–111)
Creatinine, Ser: 1.57 mg/dL — ABNORMAL HIGH (ref 0.44–1.00)
GFR, Estimated: 36 mL/min — ABNORMAL LOW (ref >60)
Glucose, Bld: 142 mg/dL — ABNORMAL HIGH (ref 70–99)
Potassium: 3.9 mmol/L (ref 3.5–5.1)
Sodium: 136 mmol/L (ref 135–145)
Total Bilirubin: 0.9 mg/dL (ref 0.3–1.2)
Total Protein: 8.3 g/dL — ABNORMAL HIGH (ref 6.5–8.1)

## 2020-09-18 LAB — APTT: aPTT: 30 seconds (ref 24–36)

## 2020-09-18 LAB — BRAIN NATRIURETIC PEPTIDE: B Natriuretic Peptide: 360 pg/mL — ABNORMAL HIGH (ref 0.0–100.0)

## 2020-09-18 LAB — TROPONIN I (HIGH SENSITIVITY): Troponin I (High Sensitivity): 496 ng/L (ref <18)

## 2020-09-18 LAB — PROTIME-INR
INR: 1.3 — ABNORMAL HIGH (ref 0.8–1.2)
Prothrombin Time: 16.2 seconds — ABNORMAL HIGH (ref 11.4–15.2)

## 2020-09-18 MED ORDER — HEPARIN BOLUS VIA INFUSION
5000.0000 [IU] | Freq: Once | INTRAVENOUS | Status: AC
  Administered 2020-09-18: 5000 [IU] via INTRAVENOUS
  Filled 2020-09-18: qty 5000

## 2020-09-18 MED ORDER — DOCUSATE SODIUM 100 MG PO CAPS
100.0000 mg | ORAL_CAPSULE | Freq: Two times a day (BID) | ORAL | Status: DC | PRN
  Administered 2020-09-22: 100 mg via ORAL
  Filled 2020-09-18: qty 1

## 2020-09-18 MED ORDER — IOHEXOL 350 MG/ML SOLN
75.0000 mL | Freq: Once | INTRAVENOUS | Status: AC | PRN
  Administered 2020-09-18: 75 mL via INTRAVENOUS

## 2020-09-18 MED ORDER — HEPARIN (PORCINE) 25000 UT/250ML-% IV SOLN
1200.0000 [IU]/h | INTRAVENOUS | Status: DC
  Administered 2020-09-18: 1350 [IU]/h via INTRAVENOUS
  Filled 2020-09-18: qty 250

## 2020-09-18 MED ORDER — POLYETHYLENE GLYCOL 3350 17 G PO PACK: 17.0000 g | PACK | Freq: Every day | ORAL | Status: DC | PRN

## 2020-09-18 MED ORDER — ALBUTEROL SULFATE HFA 108 (90 BASE) MCG/ACT IN AERS
2.0000 | INHALATION_SPRAY | RESPIRATORY_TRACT | Status: DC | PRN
  Filled 2020-09-18: qty 6.7

## 2020-09-18 NOTE — H&P (Signed)
NAME:  Judith Ross, MRN:  [id number], DOB:  [birth year], LOS: 0 ADMISSION DATE:  09/18/2020, CONSULTATION DATE:  09/18/20 REFERRING MD:  Dr. Kerman Passey, CHIEF COMPLAINT:  Shortness of Breath  History of Present Illness:  65 yo F presenting to [facility] ED with complaints of worsening shortness of breath over the last 2 weeks. She describes being unable to walk across the room earlier this evening without getting winded and her sister, bedside, encouraged her to come to the hospital. She also describes some intermittent bilateral leg cramping for the last few weeks as well. ED course: Patient found to be hypoxic with SpO2 in the 70's on room air, she is not normally on oxygen at baseline. Patient denied chest pain, sustained leg pain/swelling, abdominal pain/vomiting/diarrhea. Patient was placed on 4 L [place] with improvement of SpO2 into mid 90's. Initial vitals: afebrile at 98.7, Tachypneic at 26, mildly tachycardic at 106, BP 109/71 with SpO2 improved to 94% on 4 L [place]. Significant labs: BUN/Cr elevated, but appear close to baseline at 29/1.57, BNP elevated 360, troponin 496, neutropenic- 1.7, PT/INR- 16.2/ 1.3. CT angio positive for submassive saddle PE with R heart strain. Dr. Kerman Passey spoke with [facility] regarding patient's eligibility for direct TPA/thrombectomy. The patient is not considered a candidate for either intervention. Patient placed on a heparin drip and PCCM consulted for admission.  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  Interim History / Subjective:  Patient resting comfortably on ED stretcher with sister  bedside. She still endorses some dyspnea on 3 L [place], but is able to speak in complete sentences, stating the dyspnea is noticeable on exertion.  Objective   Blood pressure 129/86, pulse 89, temperature 98.7 F (37.1 C), temperature source Oral, resp. rate 20, SpO2 96 %.       No intake or output data in the 24 hours ending 09/18/20 2311 There were no vitals filed for this visit.  Examination: General: Adult female, critically ill, lying in bed, NAD HEENT: MM pink/moist, anicteric, atraumatic, neck supple, glasses in place Neuro: A&O x 4, able to follow commands, PERRL +3, MAE CV: s1s2 RRR, NSR on monitor, no r/m/g Pulm: Regular, mild dyspnea, non labored on 3 L [place] , breath sounds clear-BUL & diminished-BLL GI: soft, rounded, non tender, bs x 4 Skin: limited exam- patient still fully clothed, no rashes/lesions noted Extremities: warm/dry, pulses + 2 R/P, no edema noted  Resolved Hospital Problem list     Assessment & Plan:  Submassive Saddle Pulmonary embolisms with Right heart strain in the setting of Malignancy Hypoxia PMHx: Stage IV metastatic colon cancer CTa chest: moderate to large clot burden including thin saddle embolus straddling the main R & L arteries. Thrombus in the distal main pulmonary arteries extending into all lobar & many segmental & subsegmental branches, +R heart strain with RV/LV ratio: 1.89, also showed moderate L pleural effusion with associated compr PESI score 115, Wells score 4.0 I, Domingo Pulse [place], AGACNP-BC, personally viewed and interpreted this ECG. EKG Interpretation>>Date: 09/18/20 EKG Time: 18:13, Rate: 109 Rhythm: Sinus tachycardia QRS Axis: normal Intervals: normal ST/T Wave abnormalities: none  Narrative Interpretation: Sinus tachycardia - Systemic Heparin drip per  pharmacy protocol - Supplemental O2 to maintain SpO2 > 90% - Goal INR >2, w/ 5 days minimal  Overlap. - Follow up BLE dopplers to assess for DVT - Obtain ECHO  - consider  Vascular consult if IVC filter is warranted  Hyperlipidemia - continue home Lipitor  Hypothyroidism - continue home synthroid  Hypertension SBP 100's- 110's on arrival to ED, will monitor - home regimen on hold: benazepril, hydrochlorothiazide > consider restarting as patient stabilizes  Stage IV Adenocarcinoma of the colon with peritoneal carcinomatosis s/p cycle 3 of FOLFIRI plus Avastin chemotherapy Last cycle of palliative chemo completed 09/14/20. - neutropenic precautions - supportive care, patient has no complaints of chronic pain and reports not taking any pain medication outpatient   Best Practice (right click and "Reselect all SmartList Selections" daily)  Diet/type: NPO DVT prophylaxis: systemic heparin GI prophylaxis: PPI Lines: N/A Foley:  N/A Code Status:  limited Last date of multidisciplinary goals of care discussion: 09/19/20, patient stated she would not want to be intubated/mechanically ventilated but confirmed that she would want CPR/ACLS performed in the event of an arrest  Labs   CBC: Recent Labs  Lab 09/14/20 0834 09/17/20 0858 09/18/20 1819  WBC 3.8* 1.8* 1.7*  NEUTROABS 1.6* 0.5* 0.3*  HGB 11.6* 11.5* 12.6  HCT 36.6 36.8 39.5  MCV 78.0* 78.1* 76.7*  PLT 318 225 235    Basic Metabolic Panel: Recent Labs  Lab 09/14/20 0834 09/17/20 0858 09/18/20 1819  NA 140 136 136  K 3.5 3.0* 3.9  CL 94* 93* 93*  CO2 30 32 31  GLUCOSE 95 130* 142*  BUN 25* 28* 29*  CREATININE 1.37* 1.37* 1.57*  CALCIUM 8.7* 8.3* 8.4*  MG  --  2.1  --    GFR: Estimated Creatinine Clearance: 43.6 mL/min (A) (by C-G formula based on SCr of 1.57 mg/dL (H)). Recent Labs  Lab 09/14/20 0834 09/17/20 0858 09/18/20 1819  WBC 3.8* 1.8* 1.7*    Liver Function Tests: Recent Labs  Lab 09/14/20 0834 09/17/20 0858 09/18/20 1819  AST 25 39 35  ALT 9 14 13   ALKPHOS 82 80 84  BILITOT 1.0 0.8 0.9  PROT 8.2* 7.8 8.3*  ALBUMIN 3.6 3.4* 3.6   No results for input(s):  LIPASE, AMYLASE in the last 168 hours. No results for input(s): AMMONIA in the last 168 hours.  ABG No results found for: PHART, PCO2ART, PO2ART, HCO3, TCO2, ACIDBASEDEF, O2SAT   Coagulation Profile: Recent Labs  Lab 09/18/20 2209  INR 1.3*    Cardiac Enzymes: No results for input(s): CKTOTAL, CKMB, CKMBINDEX, TROPONINI in the last 168 hours.  HbA1C: Hgb A1c MFr Bld  Date/Time Value Ref Range Status  04/20/2020 10:39 AM 6.0 4.6 - 6.5 % Final    Comment:    Glycemic Control Guidelines for People with Diabetes:Non Diabetic:  <6%Goal of Therapy: <7%Additional Action Suggested:  >8%   01/13/2019 10:04 AM 6.0 4.6 - 6.5 % Final    Comment:    Glycemic Control Guidelines for People with Diabetes:Non Diabetic:  <6%Goal of Therapy: <7%Additional Action Suggested:  >8%     CBG: No results for input(s): GLUCAP in the last 168 hours.  Review of Systems: Positives in BOLD  Gen: Denies fever, chills, weight change, fatigue, night sweats, leg cramps HEENT: Denies blurred vision, double vision, hearing loss, tinnitus, sinus congestion, rhinorrhea, sore throat, neck stiffness, dysphagia PULM: Denies shortness of breath, cough, sputum production, hemoptysis, wheezing CV: Denies chest pain, edema, orthopnea, paroxysmal nocturnal dyspnea, palpitations GI:  Denies abdominal pain, nausea, vomiting, diarrhea, hematochezia, melena, constipation, change in bowel habits GU: Denies dysuria, hematuria, polyuria, oliguria, urethral discharge Endocrine: Denies hot or cold intolerance, polyuria, polyphagia or appetite change Derm: Denies rash, dry skin, scaling or peeling skin change Heme: Denies easy bruising, bleeding, bleeding gums Neuro: Denies headache, numbness, weakness, slurred speech, loss of memory or consciousness  Past Medical History:  She,  has a past medical history of Arthritis, Chicken pox, Hyperlipidemia, Hypertension, Obesity, Sleep apnea, and Thyroid disease.   Surgical History:    Past Surgical History:  Procedure Laterality Date   ABDOMINAL HYSTERECTOMY  12/2009   total   COLONOSCOPY WITH PROPOFOL N/A 08/13/2020   Procedure: COLONOSCOPY WITH PROPOFOL;  Surgeon: Jonathon Bellows, MD;  Location: [facility] ENDOSCOPY;  Service: Gastroenterology;  Laterality: N/A;   ESOPHAGOGASTRODUODENOSCOPY (EGD) WITH PROPOFOL N/A 08/13/2020   Procedure: ESOPHAGOGASTRODUODENOSCOPY (EGD) WITH PROPOFOL;  Surgeon: Jonathon Bellows, MD;  Location: [facility] ENDOSCOPY;  Service: Gastroenterology;  Laterality: N/A;   IR IMAGING GUIDED PORT INSERTION  08/27/2020   supracervical abdominal hysterectomy and bilateral salpingo--oophorectomy 01-17-2010 for fibroids  01/17/2010   TOTAL THYROIDECTOMY  1991-92   TUBAL LIGATION       Social History:   reports that she has never smoked. She has never used smokeless tobacco. She reports that she does not drink alcohol and does not use drugs.   Family History:  Her family history includes Cancer in her maternal aunt; Diabetes in her maternal grandmother and maternal uncle; Hypertension in her maternal grandmother; Stroke in her father.   Allergies Allergies  Allergen Reactions   Erythromycin Itching     Home Medications  Prior to Admission medications   Medication Sig Start Date End Date Taking? Authorizing Provider  atorvastatin (LIPITOR) 10 MG tablet TAKE 1 TABLET(10 MG) BY MOUTH DAILY 07/30/20   Dutch Quint B, FNP  benazepril (LOTENSIN) 20 MG tablet TAKE 1 TABLET BY MOUTH DAILY 05/11/20   Jearld Fenton, NP  bismuth subsalicylate (PEPTO-BISMOL) 262 MG/15ML suspension Take 30 mLs by mouth every 4 (four) hours as needed. Please provide enough ml to last her for 14 days. Thank you. 08/31/20   Jonathon Bellows, MD  clobetasol ointment (TEMOVATE) 0.05 % Apply to affected area every night for 4 weeks, then every other day for 4 weeks and then twice a week for 4 weeks or until resolution. 05/14/17   [name], MD  dicyclomine (BENTYL) 10 MG capsule Take 1 capsule (10  mg total) by mouth in the morning, at noon, in the evening, and at bedtime. Patient not taking: No sig reported 07/20/20   Jonathon Bellows, MD  doxycycline (MONODOX) 100 MG capsule Take 1 capsule (100 mg total) by mouth 2 (two) times daily. Patient not taking: No sig reported 08/31/20   Jonathon Bellows, MD  hydrochlorothiazide (HYDRODIURIL) 25 MG tablet Take 1 tablet (25 mg total) by mouth daily. 08/18/20   Venia Carbon, MD  levothyroxine (SYNTHROID) 150 MCG tablet Take 1 tablet (150 mcg total) by mouth daily. 08/18/20   Venia Carbon, MD  metroNIDAZOLE (FLAGYL) 500 MG tablet Take 1 tablet (500 mg total) by mouth 2 (two) times daily. Patient not taking: No sig reported 08/31/20   Jonathon Bellows, MD  omeprazole (PRILOSEC) 20 MG capsule Take 1 capsule (20 mg total) by mouth 2 (two) times daily before a meal. 08/31/20   Jonathon Bellows, MD  oxyCODONE-acetaminophen (PERCOCET/ROXICET) 5-325 MG tablet Take 1 tablet by mouth every 4 (four) hours as needed for severe pain.  08/18/20   Lloyd Huger, MD  potassium chloride SA (KLOR-CON) 10 MEQ tablet Take 2 tablets (20 mEq total) by mouth daily. 09/17/20 10/17/20  Verlon Au, NP  prochlorperazine (COMPAZINE) 10 MG tablet Take 1 tablet (10 mg total) by mouth every 6 (six) hours as needed (Nausea or vomiting). 08/18/20 09/17/20  Lloyd Huger, MD     Critical care time: 50 minutes       Venetia Night, AGACNP-BC Acute Care Nurse Practitioner [facility]   [phone] / [phone] Please see Amion for pager details.

## 2020-09-18 NOTE — ED Triage Notes (Signed)
Pt presents via POV c/o SOB x2 weeks. Pt has hx colon cancer. Last chemo on last Tuesday per pt report. Also c/o weakness.

## 2020-09-18 NOTE — ED Provider Notes (Signed)
[facility] Emergency Department Provider Note  Time seen: 6:32 PM  I have reviewed the triage vital signs and the nursing notes.   HISTORY  Chief Complaint Shortness of Breath   HPI Judith Ross is a 65 y.o. female with a past medical history of arthritis, hypertension, hyperlipidemia, colon cancer on chemotherapy who presents to the emergency department for shortness of breath.  According to the patient she has had worsening shortness of breath over the past several weeks.  Patient found to be satting in the 70s on room air with no baseline O2 requirement.  Patient denies any chest pain.  Satting in the mid 90s on 4 L.  Patient denies any leg pain or swelling.  No abdominal pain, no vomiting or diarrhea.  Last chemotherapy was this past Tuesday (4 days ago).  Past Medical History:  Diagnosis Date   Arthritis    Chicken pox    Hyperlipidemia    Hypertension    Obesity    Sleep apnea    Thyroid disease     Patient Active Problem List   Diagnosis Date Noted   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[place]) 08/18/2020   Abdominal pain 07/14/2020   Prediabetes 10/08/2014   Essential hypertension 05/11/2014   HLD (hyperlipidemia) 05/11/2014   Hypothyroidism 05/11/2014   Arthritis 05/11/2014   Obstructive sleep apnea 05/11/2014    Past Surgical History:  Procedure Laterality Date   ABDOMINAL HYSTERECTOMY  12/2009   total   COLONOSCOPY WITH PROPOFOL N/A 08/13/2020   Procedure: COLONOSCOPY WITH PROPOFOL;  Surgeon: Jonathon Bellows, MD;  Location: [facility] ENDOSCOPY;  Service: Gastroenterology;  Laterality: N/A;   ESOPHAGOGASTRODUODENOSCOPY (EGD) WITH PROPOFOL N/A 08/13/2020   Procedure: ESOPHAGOGASTRODUODENOSCOPY (EGD) WITH PROPOFOL;  Surgeon: Jonathon Bellows, MD;  Location: [facility] ENDOSCOPY;  Service: Gastroenterology;  Laterality: N/A;   IR IMAGING GUIDED PORT INSERTION  08/27/2020   supracervical abdominal hysterectomy and bilateral salpingo--oophorectomy 01-17-2010 for  fibroids  01/17/2010   TOTAL THYROIDECTOMY  1991-92   TUBAL LIGATION      Prior to Admission medications   Medication Sig Start Date End Date Taking? Authorizing Provider  atorvastatin (LIPITOR) 10 MG tablet TAKE 1 TABLET(10 MG) BY MOUTH DAILY 07/30/20   Dutch Quint B, FNP  benazepril (LOTENSIN) 20 MG tablet TAKE 1 TABLET BY MOUTH DAILY 05/11/20   Jearld Fenton, NP  bismuth subsalicylate (PEPTO-BISMOL) 262 MG/15ML suspension Take 30 mLs by mouth every 4 (four) hours as needed. Please provide enough ml to last her for 14 days. Thank you. 08/31/20   Jonathon Bellows, MD  clobetasol ointment (TEMOVATE) 0.05 % Apply to affected area every night for 4 weeks, then every other day for 4 weeks and then twice a week for 4 weeks or until resolution. 05/14/17   [name], MD  dicyclomine (BENTYL) 10 MG capsule Take 1 capsule (10 mg total) by mouth in the morning, at noon, in the evening, and at bedtime. Patient not taking: No sig reported 07/20/20   Jonathon Bellows, MD  doxycycline (MONODOX) 100 MG capsule Take 1 capsule (100 mg total) by mouth 2 (two) times daily. Patient not taking: No sig reported 08/31/20   Jonathon Bellows, MD  hydrochlorothiazide (HYDRODIURIL) 25 MG tablet Take 1 tablet (25 mg total) by mouth daily. 08/18/20   Venia Carbon, MD  levothyroxine (SYNTHROID) 150 MCG tablet Take 1 tablet (150 mcg total) by mouth daily. 08/18/20   Venia Carbon, MD  metroNIDAZOLE (FLAGYL) 500 MG tablet Take 1 tablet (500 mg  total) by mouth 2 (two) times daily. Patient not taking: No sig reported 08/31/20   Jonathon Bellows, MD  omeprazole (PRILOSEC) 20 MG capsule Take 1 capsule (20 mg total) by mouth 2 (two) times daily before a meal. 08/31/20   Jonathon Bellows, MD  oxyCODONE-acetaminophen (PERCOCET/ROXICET) 5-325 MG tablet Take 1 tablet by mouth every 4 (four) hours as needed for severe pain. 08/18/20   Lloyd Huger, MD  potassium chloride SA (KLOR-CON) 10 MEQ tablet Take 2 tablets (20 mEq total) by mouth daily.  09/17/20 10/17/20  Verlon Au, NP  prochlorperazine (COMPAZINE) 10 MG tablet Take 1 tablet (10 mg total) by mouth every 6 (six) hours as needed (Nausea or vomiting). 08/18/20 09/17/20  Lloyd Huger, MD    Allergies  Allergen Reactions   Erythromycin Itching    Family History  Problem Relation Age of Onset   Stroke Father    Diabetes Maternal Grandmother    Hypertension Maternal Grandmother    Diabetes Maternal Uncle    Cancer Maternal Aunt        Breast    Social History Social History   Tobacco Use   Smoking status: Never   Smokeless tobacco: Never  Vaping Use   Vaping Use: Never used  Substance Use Topics   Alcohol use: No    Alcohol/week: 0.0 standard drinks   Drug use: No    Review of Systems Constitutional: Negative for fever. Cardiovascular: Negative for chest pain. Respiratory: Significant shortness of breath. Gastrointestinal: Negative for abdominal pain, vomiting and diarrhea. Genitourinary: Negative for urinary compaints Musculoskeletal: Negative for musculoskeletal complaints Neurological: Negative for headache All other ROS negative  ____________________________________________   PHYSICAL EXAM:  VITAL SIGNS: ED Triage Vitals  Enc Vitals Group     BP 09/18/20 1807 109/71     Pulse Rate 09/18/20 1807 (!) 106     Resp 09/18/20 1807 (!) 26     Temp 09/18/20 1807 98.7 F (37.1 C)     Temp Source 09/18/20 1807 Oral     SpO2 09/18/20 1807 (!) 77 %     Weight --      Height --      Head Circumference --      Peak Flow --      Pain Score 09/18/20 1808 0     Pain Loc --      Pain Edu? --      Excl. in [place]? --    Constitutional: Alert and oriented. Well appearing and in no distress. Eyes: Normal exam ENT      Head: Normocephalic and atraumatic.      Mouth/Throat: Mucous membranes are moist. Cardiovascular: Normal rate, regular rhythm.  Respiratory: Mild tachypnea with no significant respiratory effort.  No wheeze rales or  rhonchi. Gastrointestinal: Soft and nontender. No distention.   Musculoskeletal: Nontender with normal range of motion in all extremities. No lower extremity tenderness or edema. Neurologic:  Normal speech and language. No gross focal neurologic deficits  Skin:  Skin is warm, dry and intact.  Psychiatric: Mood and affect are normal.   ____________________________________________    EKG  EKG viewed and interpreted by myself shows sinus tachycardia 109 bpm with a narrow QRS, normal axis, normal intervals, no concerning ST changes.  ____________________________________________    RADIOLOGY  IMPRESSION:  1. Examination is positive for pulmonary embolus with moderate to  large clot burden including thin saddle embolus straddling the main  right and left pulmonary arteries. There is thrombus in the distal  main pulmonary arteries extending into all lobar and many segmental  and subsegmental branches. Positive for right heart strain with RV  to LV ratio of 1.89.  2. Moderate left pleural effusion with compressive atelectasis,  similar to recent PET. Peripheral opacities in the left lower lobe  may represent pulmonary infarct given occlusive emboli in this  region.  3. Pulmonary nodule in the lingula measuring 7 mm, also seen on  prior PET. Recommend attention at follow-up.  4. Subareolar left breast mass is only partially included in the  field of view, similar to recent PET were it was not hypermetabolic.   Chest x-ray shows atelectasis and pulmonary vascular congestion.  ____________________________________________   INITIAL IMPRESSION / ASSESSMENT AND PLAN / ED COURSE  Pertinent labs & imaging results that were available during my care of the patient were reviewed by me and considered in my medical decision making (see chart for details).   Patient presents emergency department for shortness of breath found to be satting in the 70s on room air.  Patient states that has been  progressing over the past several weeks but much worse today.  No chest pain.  No leg pain or swelling.  Patient is not on any blood thinners, no history of blood clots.  Given the patient's history of cancer, differential is quite broad but would include pneumonia, pleural effusion, pulmonary edema, pulmonary embolism.  We will check labs, chest x-ray and continue to closely monitor.  Currently the patient appears well on 4 L nasal cannula, no respiratory distress but mild tachypnea.  Patient's labs are showing an elevated troponin.  Given the elevated troponin hypoxia I ordered a CTA of the chest.  CTA of the chest showed significant pulmonary embolus burden including a small section of saddle embolus.  I spoke to the [place] intensive care doctor who recommended that I speak to [facility] intensivist.  I spoke to Dr. Marchelle Gearing regarding the patient who reviewed the CT images.  Does not believe the patient would necessarily benefit from thrombectomy or directed tPA, recommends admitting to our hospital with heparin infusion and possible systemic tPA if the patient were to decompensate.  I spoke to our ICU PA who will be admitting the patient.  CHAUNDA VANDERGRIFF was evaluated in Emergency Department on 09/18/2020 for the symptoms described in the history of present illness. She was evaluated in the context of the global COVID-19 pandemic, which necessitated consideration that the patient might be at risk for infection with the SARS-CoV-2 virus that causes COVID-19. Institutional protocols and algorithms that pertain to the evaluation of patients at risk for COVID-19 are in a state of rapid change based on information released by regulatory bodies including the CDC and federal and state organizations. These policies and algorithms were followed during the patient's care in the ED.  CRITICAL CARE Performed by: Harvest Dark   Total critical care time: 45 minutes  Critical care time was exclusive of  separately billable procedures and treating other patients.  Critical care was necessary to treat or prevent imminent or life-threatening deterioration.  Critical care was time spent personally by me on the following activities: development of treatment plan with patient and/or surrogate as well as nursing, discussions with consultants, evaluation of patient's response to treatment, examination of patient, obtaining history from patient or surrogate, ordering and performing treatments and interventions, ordering and review of laboratory studies, ordering and review of radiographic studies, pulse oximetry and re-evaluation of patient's condition.  ____________________________________________  FINAL CLINICAL IMPRESSION(S) / ED DIAGNOSES  Dyspnea   Harvest Dark, MD 09/18/20 2303

## 2020-09-18 NOTE — Progress Notes (Signed)
ANTICOAGULATION CONSULT NOTE - Initial Consult  Pharmacy Consult for Heparin Infusion  Indication: pulmonary embolus  Allergies  Allergen Reactions   Erythromycin Itching    Patient Measurements:   Heparin Dosing Weight: 83kg  Vital Signs: Temp: 98.7 F (37.1 C) (07/02 1807) Temp Source: Oral (07/02 1807) BP: 112/70 (07/02 2137) Pulse Rate: 97 (07/02 2137)  Labs: Recent Labs    09/17/20 0858 09/18/20 1819  HGB 11.5* 12.6  HCT 36.8 39.5  PLT 225 182  CREATININE 1.37* 1.57*  TROPONINIHS  --  496*    Estimated Creatinine Clearance: 43.6 mL/min (A) (by C-G formula based on SCr of 1.57 mg/dL (H)).   Medical History: Past Medical History:  Diagnosis Date   Arthritis    Chicken pox    Hyperlipidemia    Hypertension    Obesity    Sleep apnea    Thyroid disease     Assessment: 65 yo female admitted with shortness of breath and weakness. Patient positive for pulmonary embolism. Pharmacy consulted for heparin infusion dosing and monitoring.  Goal of Therapy:  Heparin level 0.3-0.7 units/ml Monitor platelets by anticoagulation protocol: Yes   Plan:  Give 5000 units bolus x 1 Start heparin infusion at 1350 units/hr Check anti-Xa level in 6 hours and daily while on heparin Continue to monitor H&H and platelets  Pernell Dupre, PharmD, BCPS Clinical Pharmacist 09/18/2020 10:20 PM

## 2020-09-19 ENCOUNTER — Inpatient Hospital Stay
Admit: 2020-09-19 | Discharge: 2020-09-19 | Disposition: A | Discharge status: Home / Self Care | Payer: Medicare Other | Attending: Pulmonary Disease | Admitting: Pulmonary Disease

## 2020-09-19 ENCOUNTER — Inpatient Hospital Stay: Payer: Medicare Other

## 2020-09-19 ENCOUNTER — Encounter: Payer: Self-pay | Admitting: Pulmonary Disease

## 2020-09-19 DIAGNOSIS — I2602 Saddle embolus of pulmonary artery with acute cor pulmonale: Secondary | ICD-10-CM

## 2020-09-19 LAB — PHOSPHORUS
Phosphorus: 2.6 mg/dL (ref 2.5–4.6)
Phosphorus: 3.2 mg/dL (ref 2.5–4.6)

## 2020-09-19 LAB — MRSA NEXT GEN BY PCR, NASAL: MRSA by PCR Next Gen: NOT DETECTED

## 2020-09-19 LAB — ECHOCARDIOGRAM COMPLETE
AR max vel: 3.06 cm2
AV Area VTI: 3.22 cm2
AV Area mean vel: 2.51 cm2
AV Mean grad: 5 mmHg
AV Peak grad: 6.6 mmHg
Ao pk vel: 1.28 m/s
Area-P 1/2: 5.13 cm2
Height: 66 in
S' Lateral: 1.67 cm
Weight: 3643.76 oz

## 2020-09-19 LAB — CBC
HCT: 35 % — ABNORMAL LOW (ref 36.0–46.0)
Hemoglobin: 11.2 g/dL — ABNORMAL LOW (ref 12.0–15.0)
MCH: 24.8 pg — ABNORMAL LOW (ref 26.0–34.0)
MCHC: 32 g/dL (ref 30.0–36.0)
MCV: 77.4 fL — ABNORMAL LOW (ref 80.0–100.0)
Platelets: 154 10*3/uL (ref 150–400)
RBC: 4.52 MIL/uL (ref 3.87–5.11)
RDW: 15.9 % — ABNORMAL HIGH (ref 11.5–15.5)
WBC: 2.5 10*3/uL — ABNORMAL LOW (ref 4.0–10.5)
nRBC: 0 % (ref 0.0–0.2)

## 2020-09-19 LAB — CBC WITH DIFFERENTIAL/PLATELET
Abs Immature Granulocytes: 0.04 10*3/uL (ref 0.00–0.07)
Basophils Absolute: 0 10*3/uL (ref 0.0–0.1)
Basophils Relative: 1 %
Eosinophils Absolute: 0 10*3/uL (ref 0.0–0.5)
Eosinophils Relative: 1 %
HCT: 39.5 % (ref 36.0–46.0)
Hemoglobin: 12.6 g/dL (ref 12.0–15.0)
Immature Granulocytes: 3 %
Lymphocytes Relative: 73 %
Lymphs Abs: 1.2 10*3/uL (ref 0.7–4.0)
MCH: 24.5 pg — ABNORMAL LOW (ref 26.0–34.0)
MCHC: 31.9 g/dL (ref 30.0–36.0)
MCV: 76.7 fL — ABNORMAL LOW (ref 80.0–100.0)
Monocytes Absolute: 0.1 10*3/uL (ref 0.1–1.0)
Monocytes Relative: 6 %
Neutro Abs: 0.3 10*3/uL — CL (ref 1.7–7.7)
Neutrophils Relative %: 16 %
Platelets: 182 10*3/uL (ref 150–400)
RBC: 5.15 MIL/uL — ABNORMAL HIGH (ref 3.87–5.11)
RDW: 16.1 % — ABNORMAL HIGH (ref 11.5–15.5)
WBC: 1.7 10*3/uL — ABNORMAL LOW (ref 4.0–10.5)
nRBC: 3 % — ABNORMAL HIGH (ref 0.0–0.2)

## 2020-09-19 LAB — RESP PANEL BY RT-PCR (FLU A&B, COVID) ARPGX2
Influenza A by PCR: NEGATIVE
Influenza B by PCR: NEGATIVE
SARS Coronavirus 2 by RT PCR: NEGATIVE

## 2020-09-19 LAB — COMPREHENSIVE METABOLIC PANEL
ALT: 10 U/L (ref 0–44)
AST: 24 U/L (ref 15–41)
Albumin: 3.1 g/dL — ABNORMAL LOW (ref 3.5–5.0)
Alkaline Phosphatase: 69 U/L (ref 38–126)
Anion gap: 11 (ref 5–15)
BUN: 25 mg/dL — ABNORMAL HIGH (ref 8–23)
CO2: 32 mmol/L (ref 22–32)
Calcium: 8.1 mg/dL — ABNORMAL LOW (ref 8.9–10.3)
Chloride: 95 mmol/L — ABNORMAL LOW (ref 98–111)
Creatinine, Ser: 1.39 mg/dL — ABNORMAL HIGH (ref 0.44–1.00)
GFR, Estimated: 42 mL/min — ABNORMAL LOW (ref >60)
Glucose, Bld: 100 mg/dL — ABNORMAL HIGH (ref 70–99)
Potassium: 3.5 mmol/L (ref 3.5–5.1)
Sodium: 138 mmol/L (ref 135–145)
Total Bilirubin: 0.8 mg/dL (ref 0.3–1.2)
Total Protein: 7.1 g/dL (ref 6.5–8.1)

## 2020-09-19 LAB — HEPARIN LEVEL (UNFRACTIONATED): Heparin Unfractionated: 0.79 IU/mL — ABNORMAL HIGH (ref 0.30–0.70)

## 2020-09-19 LAB — MAGNESIUM: Magnesium: 2.1 mg/dL (ref 1.7–2.4)

## 2020-09-19 MED ORDER — APIXABAN 5 MG PO TABS
10.0000 mg | ORAL_TABLET | Freq: Two times a day (BID) | ORAL | Status: DC
  Administered 2020-09-19 – 2020-09-22 (×7): 10 mg via ORAL
  Filled 2020-09-19 (×7): qty 2

## 2020-09-19 MED ORDER — CHLORHEXIDINE GLUCONATE CLOTH 2 % EX PADS
6.0000 | MEDICATED_PAD | Freq: Every day | CUTANEOUS | Status: DC
  Administered 2020-09-20 – 2020-09-22 (×3): 6 via TOPICAL

## 2020-09-19 MED ORDER — PANTOPRAZOLE SODIUM 40 MG IV SOLR
40.0000 mg | Freq: Two times a day (BID) | INTRAVENOUS | Status: DC
  Administered 2020-09-19 – 2020-09-20 (×4): 40 mg via INTRAVENOUS
  Filled 2020-09-19 (×4): qty 40

## 2020-09-19 MED ORDER — POTASSIUM CHLORIDE CRYS ER 20 MEQ PO TBCR
20.0000 meq | EXTENDED_RELEASE_TABLET | Freq: Once | ORAL | Status: DC
  Filled 2020-09-19: qty 1

## 2020-09-19 MED ORDER — APIXABAN 5 MG PO TABS: 5.0000 mg | ORAL_TABLET | Freq: Two times a day (BID) | ORAL | Status: DC

## 2020-09-19 MED ORDER — ATORVASTATIN CALCIUM 20 MG PO TABS
10.0000 mg | ORAL_TABLET | Freq: Every day | ORAL | Status: DC
  Administered 2020-09-19 – 2020-09-22 (×4): 10 mg via ORAL
  Filled 2020-09-19 (×4): qty 1

## 2020-09-19 MED ORDER — POTASSIUM CHLORIDE 20 MEQ PO PACK
20.0000 meq | PACK | Freq: Once | ORAL | Status: AC
  Administered 2020-09-19: 20 meq via ORAL

## 2020-09-19 MED ORDER — LEVOTHYROXINE SODIUM 50 MCG PO TABS
150.0000 ug | ORAL_TABLET | Freq: Every day | ORAL | Status: DC
  Administered 2020-09-19 – 2020-09-22 (×4): 150 ug via ORAL
  Filled 2020-09-19: qty 3
  Filled 2020-09-19 (×2): qty 1
  Filled 2020-09-19 (×2): qty 3

## 2020-09-19 NOTE — Plan of Care (Signed)

## 2020-09-19 NOTE — Progress Notes (Signed)
ANTICOAGULATION CONSULT NOTE - Initial Consult  Pharmacy Consult for Heparin Infusion  Indication: pulmonary embolus  Allergies  Allergen Reactions   Erythromycin Itching    Patient Measurements: Height: 5\' 6"  (167.6 cm) Weight: 103.3 kg (227 lb 11.8 oz) IBW/kg (Calculated) : 59.3 Heparin Dosing Weight: 83kg  Vital Signs: Temp: 96.4 F (35.8 C) (07/03 0500) Temp Source: Oral (07/03 0500) BP: 131/78 (07/03 0600) Pulse Rate: 88 (07/03 0600)  Labs: Recent Labs    09/17/20 0858 09/18/20 1819 09/18/20 2209 09/19/20 0544  HGB 11.5* 12.6  --  11.2*  HCT 36.8 39.5  --  35.0*  PLT 225 182  --  154  APTT  --   --  30  --   LABPROT  --   --  16.2*  --   INR  --   --  1.3*  --   HEPARINUNFRC  --   --   --  0.79*  CREATININE 1.37* 1.57*  --  1.39*  TROPONINIHS  --  496*  --   --      Estimated Creatinine Clearance: 49 mL/min (A) (by C-G formula based on SCr of 1.39 mg/dL (H)).   Medical History: Past Medical History:  Diagnosis Date   Arthritis    Chicken pox    Helicobacter pylori gastritis    Hyperlipidemia    Hypertension    Metastatic colon cancer in female [facility])    Obesity    Sleep apnea    Thyroid disease     Assessment: 65 yo female admitted with shortness of breath and weakness. Patient positive for pulmonary embolism. Pharmacy consulted for heparin infusion dosing and monitoring.  Goal of Therapy:  Heparin level 0.3-0.7 units/ml Monitor platelets by anticoagulation protocol: Yes  07/03 0544 HL = 0.79, supratherapeutic   Plan:  Decrease heparin infusion to 1200 units/hr. Recheck HL in 6 hours after rate change. Daily CBC while on heparin.  Renda Rolls, PharmD, [facility] 09/19/2020 6:38 AM

## 2020-09-19 NOTE — Progress Notes (Signed)
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  Patient receiving folfox and zirabev.  CT angio positive for submassive saddle PE with R heart strain. Pharmacy is asked to follow and replace electrolytes while the patient is in the CCU  Goal of Therapy:  Potassium 4.0 - 5.1 mmol/L Magnesium 2.0 - 2.4 mg/dL All Other Electrolytes WNL  Plan:  20 mEq po KCl x 1 Re-check electrolytes in am  Dallie Piles ,PharmD Clinical Pharmacist 09/19/2020 8:25 AM

## 2020-09-20 DIAGNOSIS — I2699 Other pulmonary embolism without acute cor pulmonale: Secondary | ICD-10-CM

## 2020-09-20 LAB — BASIC METABOLIC PANEL
Anion gap: 14 (ref 5–15)
BUN: 23 mg/dL (ref 8–23)
CO2: 29 mmol/L (ref 22–32)
Calcium: 7.7 mg/dL — ABNORMAL LOW (ref 8.9–10.3)
Chloride: 94 mmol/L — ABNORMAL LOW (ref 98–111)
Creatinine, Ser: 1.25 mg/dL — ABNORMAL HIGH (ref 0.44–1.00)
GFR, Estimated: 48 mL/min — ABNORMAL LOW (ref >60)
Glucose, Bld: 101 mg/dL — ABNORMAL HIGH (ref 70–99)
Potassium: 3.4 mmol/L — ABNORMAL LOW (ref 3.5–5.1)
Sodium: 137 mmol/L (ref 135–145)

## 2020-09-20 LAB — CBC
HCT: 34 % — ABNORMAL LOW (ref 36.0–46.0)
Hemoglobin: 10.9 g/dL — ABNORMAL LOW (ref 12.0–15.0)
MCH: 24.8 pg — ABNORMAL LOW (ref 26.0–34.0)
MCHC: 32.1 g/dL (ref 30.0–36.0)
MCV: 77.4 fL — ABNORMAL LOW (ref 80.0–100.0)
Platelets: 134 10*3/uL — ABNORMAL LOW (ref 150–400)
RBC: 4.39 MIL/uL (ref 3.87–5.11)
RDW: 15.8 % — ABNORMAL HIGH (ref 11.5–15.5)
WBC: 1.7 10*3/uL — ABNORMAL LOW (ref 4.0–10.5)
nRBC: 0 % (ref 0.0–0.2)

## 2020-09-20 LAB — MAGNESIUM: Magnesium: 2 mg/dL (ref 1.7–2.4)

## 2020-09-20 LAB — HIV ANTIBODY (ROUTINE TESTING W REFLEX): HIV Screen 4th Generation wRfx: NONREACTIVE

## 2020-09-20 LAB — PHOSPHORUS: Phosphorus: 2.8 mg/dL (ref 2.5–4.6)

## 2020-09-20 MED ORDER — PROSOURCE PLUS PO LIQD
30.0000 mL | Freq: Three times a day (TID) | ORAL | Status: DC
  Administered 2020-09-20 – 2020-09-21 (×2): 30 mL via ORAL
  Filled 2020-09-20 (×9): qty 30

## 2020-09-20 MED ORDER — ENSURE ENLIVE PO LIQD
237.0000 mL | Freq: Three times a day (TID) | ORAL | Status: DC
  Administered 2020-09-20 – 2020-09-21 (×3): 237 mL via ORAL

## 2020-09-20 MED ORDER — ADULT MULTIVITAMIN W/MINERALS CH
1.0000 | ORAL_TABLET | Freq: Every day | ORAL | Status: DC
  Administered 2020-09-20 – 2020-09-21 (×2): 1 via ORAL
  Filled 2020-09-20 (×3): qty 1

## 2020-09-20 MED ORDER — PANTOPRAZOLE SODIUM 40 MG PO TBEC
40.0000 mg | DELAYED_RELEASE_TABLET | Freq: Two times a day (BID) | ORAL | Status: DC
  Administered 2020-09-20 – 2020-09-22 (×4): 40 mg via ORAL
  Filled 2020-09-20 (×4): qty 1

## 2020-09-20 NOTE — Progress Notes (Signed)
Transferred to the 1C via wheelchair. Maintained on 2L [place], vitals remain stable. No complaints verbalized. Report given to [facility].

## 2020-09-20 NOTE — Progress Notes (Signed)
ICU pickup for 7/5  h/o colon cancer submassive PE doing well, alert and awake, on oral AC, needsoxygen for now, can transfer to floor and TRH service  TRH to assume care on 7/5  Ralene Muskrat MD

## 2020-09-20 NOTE — Progress Notes (Signed)
Initial Nutrition Assessment  DOCUMENTATION CODES:  Obesity unspecified  INTERVENTION:  Add Ensure Enlive po TID, each supplement provides 350 kcal and 20 grams of protein.  Add 30 ml ProSource Plus po TID, each supplement provides 100 kcal and 15 grams of protein.    Add MVI with minerals daily.  NUTRITION DIAGNOSIS:  Unintentional weight loss related to cancer and cancer related treatments as evidenced by per patient/family report, percent weight loss.  GOAL:  Patient will meet greater than or equal to 90% of their needs  MONITOR:  PO intake, Supplement acceptance, Labs, Weight trends, I & O's  REASON FOR ASSESSMENT:  Malnutrition Screening Tool    ASSESSMENT:  65 yo female with a PMH of HLD, HTN, thyroid disease, arthritis, and stage 4 metastatic colon cancer (currently on chemotherapy - s/p cycle 3 of FOLFIRI plus Avastin) who presents with submissive saddle pulmonary embolisms with R heart strain ISO malignancy and hypoxia.  RD working remotely.  Unable to reach patient by phone. Per MST report, pt endorses decreased appetite.  Per Epic, pt has lost 24 lbs (9.6%) in the past 1.5 months, which is significant and severe for the time frame.  Unable to conduct NFPE at this time.  Of note, outpatient cancer center RD saw patient 09/14/20. Per RD note, "Patient reports decreased appetite, taste and smell alterations, constipation. Patient reports nausea is better but had nausea for few days following last treatment.  Reports yesterday able to drink ensure original and couldn't remember anything else she was able to eat.  Says that she takes a few bites and feels full. Reports poor appetite for the last month."  RD suspects pt is malnourished but unable to definitively diagnose at this time.  Recommend adding Ensure Enlive TID, ProSource TID, and MVI with minerals daily to aid in intake.  Medications: reviewed; Synthroid, Protonix  Labs: reviewed; K 3.4 (L), Glucose 101  (H)  NUTRITION - FOCUSED PHYSICAL EXAM: Unable to conduct  Diet Order:   Diet Order             Diet regular Room service appropriate? Yes; Fluid consistency: Thin  Diet effective now                  EDUCATION NEEDS:  Education needs have been addressed  Skin:  Skin Assessment: Reviewed RN Assessment  Last BM:  PTA/unknown  Height:  Ht Readings from Last 1 Encounters:  09/19/20 5\' 6"  (1.676 m)   Weight:  Wt Readings from Last 1 Encounters:  09/20/20 105.6 kg   Ideal Body Weight:  59.1 kg  BMI:  Body mass index is 37.58 kg/m.  Estimated Nutritional Needs:  Kcal:  2600-2800 Protein:  135-150 grams Fluid:  >2.5 L  Derrel Nip, RD, LDN (she/her/hers) Registered Dietitian I After-Hours/Weekend Pager # in [place]

## 2020-09-20 NOTE — Progress Notes (Signed)
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  Patient receiving folfox and zirabev.  CT angio positive for submassive saddle PE with R heart strain. Pharmacy is asked to follow and replace electrolytes while the patient is in the CCU  Goal of Therapy:  Potassium 4.0 - 5.1 mmol/L Magnesium 2.0 - 2.4 mg/dL All Other Electrolytes WNL  Plan:  No electrolyte replacement indicated at this time Re-check electrolytes in AM  Judith Ross 09/20/2020 7:22 AM

## 2020-09-20 NOTE — H&P (Signed)
NAME:  Judith Ross, MRN:  [id number], DOB:  15-Oct-1955, LOS: 2 ADMISSION DATE:  09/18/2020, CONSULTATION DATE:  09/18/20 REFERRING MD:  Dr. Kerman Passey, CHIEF COMPLAINT:  Shortness of Breath  History of Present Illness:  65 yo F presenting to [facility] ED with complaints of worsening shortness of breath over the last 2 weeks. She describes being unable to walk across the room earlier this evening without getting winded and her sister, bedside, encouraged her to come to the hospital. She also describes some intermittent bilateral leg cramping for the last few weeks as well. ED course: Patient found to be hypoxic with SpO2 in the 70's on room air, she is not normally on oxygen at baseline. Patient denied chest pain, sustained leg pain/swelling, abdominal pain/vomiting/diarrhea. Patient was placed on 4 L [place] with improvement of SpO2 into mid 90's. Initial vitals: afebrile at 98.7, Tachypneic at 26, mildly tachycardic at 106, BP 109/71 with SpO2 improved to 94% on 4 L [place]. Significant labs: BUN/Cr elevated, but appear close to baseline at 29/1.57, BNP elevated 360, troponin 496, neutropenic- 1.7, PT/INR- 16.2/ 1.3. CT angio positive for submassive saddle PE with R heart strain. Dr. Kerman Passey spoke with [facility] regarding patient's eligibility for direct TPA/thrombectomy. The patient is not considered a candidate for either intervention. Patient placed on a heparin drip and PCCM consulted for admission.  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 7/4 no acute ICU issues Interim History / Subjective:  Resting comfortably Does not need  ICU or SD Transfer to [facility] alert and awake  Objective   Blood pressure 114/71, pulse 78, temperature 98 F (36.7 C), temperature source Oral, resp. rate 19, height 5\' 6"  (1.676 m), weight 105.6 kg, SpO2 98 %.        Intake/Output Summary (Last 24 hours) at 09/20/2020 0723 Last data filed at 09/20/2020 0607 Gross per 24 hour  Intake --  Output 200 ml  Net -200 ml   Filed Weights   09/19/20 0149 09/20/20 0500  Weight: 103.3 kg 105.6 kg    Review of Systems:  Gen:  Denies  fever, sweats, chills weight loss  HEENT: Denies blurred vision, double vision, ear pain, eye pain, hearing loss, nose bleeds, sore throat Cardiac:  No dizziness, chest pain or heaviness, chest tightness,edema, No JVD Resp:   No cough, -sputum production, -shortness of breath,-wheezing, -hemoptysis,  Gi: Denies swallowing difficulty, stomach pain, nausea or vomiting, diarrhea, constipation, bowel incontinence Other:  All other systems negative  Physical Examination:   General Appearance: No distress  Neuro:without focal findings,  speech normal,  HEENT: PERRLA, EOM intact.   Pulmonary: normal breath sounds, No wheezing.  CardiovascularNormal S1,S2.  No m/r/g.   Abdomen: Benign, Soft, non-tender. Renal:  No costovertebral tenderness  GU:  Not performed at this time. Endoc: No evident thyromegaly Skin:   warm, no rashes, no ecchymosis  Extremities: normal, no cyanosis, clubbing. PSYCHIATRIC: Mood, affect within normal limits.   ALL OTHER ROS ARE NEGATIVE    Assessment & Plan:  Submassive Saddle Pulmonary embolisms with Right heart strain in the setting of Malignancy Hypoxia PMHx: Stage IV metastatic colon cancer No resp distress, continue anticoagulation CTa chest:  moderate to large clot burden including thin saddle embolus straddling the main R & L arteries. Thrombus in the distal main pulmonary arteries extending into all lobar & many segmental & subsegmental branches, +R heart strain with RV/LV  ratio: 1.89, also showed moderate L pleural effusion with associated compr - Systemic Heparin drip per pharmacy protocol - Supplemental O2 to maintain SpO2 > 90% - Goal INR >2, w/ 5 days minimal  Overlap.  Stage IV Adenocarcinoma of the colon with peritoneal carcinomatosis s/p cycle 3 of FOLFIRI plus Avastin chemotherapy Follow up oncology  Best Practice (right click and "Reselect all SmartList Selections" daily)  Diet/type: NPO DVT prophylaxis: systemic heparin GI prophylaxis: PPI Lines: N/A Foley:  N/A Code Status:  limited Last date of multidisciplinary goals of care discussion: 09/19/20, patient stated she would not want to be intubated/mechanically ventilated but confirmed that she would want CPR/ACLS performed in the event of an arrest    Labs   CBC: Recent Labs  Lab 09/14/20 0834 09/17/20 0858 09/18/20 1819 09/19/20 0544  WBC 3.8* 1.8* 1.7* 2.5*  NEUTROABS 1.6* 0.5* 0.3*  --   HGB 11.6* 11.5* 12.6 11.2*  HCT 36.6 36.8 39.5 35.0*  MCV 78.0* 78.1* 76.7* 77.4*  PLT 318 225 182 154     Basic Metabolic Panel: Recent Labs  Lab 09/14/20 0834 09/17/20 0858 09/18/20 1819 09/18/20 2359 09/19/20 0544  NA 140 136 136  --  138  K 3.5 3.0* 3.9  --  3.5  CL 94* 93* 93*  --  95*  CO2 30 32 31  --  32  GLUCOSE 95 130* 142*  --  100*  BUN 25* 28* 29*  --  25*  CREATININE 1.37* 1.37* 1.57*  --  1.39*  CALCIUM 8.7* 8.3* 8.4*  --  8.1*  MG  --  2.1  --   --  2.1  PHOS  --   --   --  2.6 3.2    GFR: Estimated Creatinine Clearance: 49.6 mL/min (A) (by C-G formula based on SCr of 1.39 mg/dL (H)). Recent Labs  Lab 09/14/20 0834 09/17/20 0858 09/18/20 1819 09/19/20 0544  WBC 3.8* 1.8* 1.7* 2.5*     Liver Function Tests: Recent Labs  Lab 09/14/20 0834 09/17/20 0858 09/18/20 1819 09/19/20 0544  AST 25 39 35 24  ALT 9 14 13 10   ALKPHOS 82 80 84 69  BILITOT 1.0 0.8 0.9 0.8  PROT 8.2* 7.8 8.3* 7.1  ALBUMIN 3.6 3.4* 3.6 3.1*   Coagulation Profile: HbA1C: Hgb  A1c MFr Bld  Date/Time Value Ref Range Status  04/20/2020 10:39 AM 6.0 4.6 - 6.5 % Final    Comment:    Glycemic Control Guidelines for People with Diabetes:Non Diabetic:  <6%Goal of Therapy: <7%Additional Action Suggested:  >8%   01/13/2019 10:04 AM 6.0 4.6 - 6.5 % Final    Comment:    Glycemic Control Guidelines for People with Diabetes:Non Diabetic:  <6%Goal of Therapy: <7%Additional Action Suggested:  >8%     TRANSFER TO TRH SERVICE TRANSFER TO GEN MED FLOOR  Corrin Parker, M.D.  [facility]  Medical Director [place] Director [place] Department

## 2020-09-20 NOTE — Progress Notes (Signed)
PHARMACIST - PHYSICIAN COMMUNICATION  CONCERNING: IV to Oral Route Change Policy  RECOMMENDATION: This patient is receiving pantoprazole by the intravenous route.  Based on criteria approved by the Pharmacy and Therapeutics Committee, the intravenous medication(s) is/are being converted to the equivalent oral dose form(s).   DESCRIPTION: These criteria include: The patient is eating (either orally or via tube) and/or has been taking other orally administered medications for a least 24 hours The patient has no evidence of active gastrointestinal bleeding or impaired GI absorption (gastrectomy, short bowel, patient on TNA or NPO).  If you have questions about this conversion, please contact the [place], [facility] 09/20/2020 9:20 AM

## 2020-09-21 DIAGNOSIS — J9601 Acute respiratory failure with hypoxia: Secondary | ICD-10-CM

## 2020-09-21 DIAGNOSIS — D72818 Other decreased white blood cell count: Secondary | ICD-10-CM

## 2020-09-21 DIAGNOSIS — I824Y2 Acute embolism and thrombosis of unspecified deep veins of left proximal lower extremity: Secondary | ICD-10-CM

## 2020-09-21 DIAGNOSIS — D649 Anemia, unspecified: Secondary | ICD-10-CM

## 2020-09-21 DIAGNOSIS — C482 Malignant neoplasm of peritoneum, unspecified: Secondary | ICD-10-CM

## 2020-09-21 DIAGNOSIS — C785 Secondary malignant neoplasm of large intestine and rectum: Secondary | ICD-10-CM

## 2020-09-21 DIAGNOSIS — D696 Thrombocytopenia, unspecified: Secondary | ICD-10-CM

## 2020-09-21 LAB — BASIC METABOLIC PANEL
Anion gap: 9 (ref 5–15)
BUN: 21 mg/dL (ref 8–23)
CO2: 33 mmol/L — ABNORMAL HIGH (ref 22–32)
Calcium: 7.5 mg/dL — ABNORMAL LOW (ref 8.9–10.3)
Chloride: 94 mmol/L — ABNORMAL LOW (ref 98–111)
Creatinine, Ser: 1.1 mg/dL — ABNORMAL HIGH (ref 0.44–1.00)
GFR, Estimated: 56 mL/min — ABNORMAL LOW (ref >60)
Glucose, Bld: 96 mg/dL (ref 70–99)
Potassium: 3.1 mmol/L — ABNORMAL LOW (ref 3.5–5.1)
Sodium: 136 mmol/L (ref 135–145)

## 2020-09-21 LAB — GLUCOSE, CAPILLARY: Glucose-Capillary: 110 mg/dL — ABNORMAL HIGH (ref 70–99)

## 2020-09-21 MED ORDER — POTASSIUM CHLORIDE 20 MEQ PO PACK
40.0000 meq | PACK | Freq: Once | ORAL | Status: AC
  Administered 2020-09-21: 13:00:00 40 meq via ORAL
  Filled 2020-09-21: qty 2

## 2020-09-21 MED ORDER — POTASSIUM CHLORIDE CRYS ER 20 MEQ PO TBCR
40.0000 meq | EXTENDED_RELEASE_TABLET | Freq: Once | ORAL | Status: DC
  Filled 2020-09-21: qty 2

## 2020-09-21 NOTE — TOC Initial Note (Signed)
Transition of Care [facility]) - Initial/Assessment Note    Patient Details  Name: Judith Ross MRN: [id number] Date of Birth: 1955/12/14  Transition of [facility] CM/SW Contact:    Shelbie Hutching, RN Phone Number: 09/21/2020, 12:40 PM  Clinical Narrative:                 Patient admitted to the hospital with pulmonary embolus.  RNCM met with patient at the bedside.  Patient reports that she lives alone and is independent and drives at home.  Patient reports having a good family support system.  Patient is on acute O2 at 2L and hopes to be able to discharge from the hospital without oxygen.  If oxygen is needed at discharge [facility] will arrange the delivery.     Expected Discharge Plan: Home/Self Care Barriers to Discharge: Continued Medical Work up   Patient Goals and CMS Choice Patient states their goals for this hospitalization and ongoing recovery are:: Patient wants to breath well without oxygen      Expected Discharge Plan and Services Expected Discharge Plan: Home/Self Care   Discharge Planning Services: CM Consult   Living arrangements for the past 2 months: Single Family Home                 DME Arranged: N/A DME Agency: NA       HH Arranged: NA          Prior Living Arrangements/Services Living arrangements for the past 2 months: Single Family Home Lives with:: Self Patient language and need for interpreter reviewed:: Yes Do you feel safe going back to the place where you live?: Yes      Need for Family Participation in Patient Care: Yes (Comment) (pulmonary embolus) Care giver support system in place?: Yes (comment) (son and daughter in Sports coach)   Criminal Activity/Legal Involvement Pertinent to Current Situation/Hospitalization: No - Comment as needed  Activities of Daily Living Home Assistive Devices/Equipment: None ADL Screening (condition at time of admission) Patient's cognitive ability adequate to safely complete daily activities?: Yes Is the patient deaf  or have difficulty hearing?: No Does the patient have difficulty seeing, even when wearing glasses/contacts?: No Does the patient have difficulty concentrating, remembering, or making decisions?: No Patient able to express need for assistance with ADLs?: Yes Does the patient have difficulty dressing or bathing?: No Independently performs ADLs?: Yes (appropriate for developmental age) Does the patient have difficulty walking or climbing stairs?: Yes Weakness of Legs: None Weakness of Arms/Hands: None  Permission Sought/Granted Permission sought to share information with : Case Manager, Family Supports Permission granted to share information with : Yes, Verbal Permission Granted              Emotional Assessment Appearance:: Appears stated age Attitude/Demeanor/Rapport: Engaged Affect (typically observed): Accepting Orientation: : Oriented to Self, Oriented to Place, Oriented to  Time, Oriented to Situation Alcohol / Substance Use: Not Applicable Psych Involvement: No (comment)  Admission diagnosis:  Acute pulmonary embolism (HCC) [I26.99] Acute saddle pulmonary embolism with acute cor pulmonale (HCC) [I26.02] Patient Active Problem List   Diagnosis Date Noted   Acute pulmonary embolism ([place]) 09/18/2020   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[place]) 08/18/2020   Abdominal pain 07/14/2020   Prediabetes 10/08/2014   Essential hypertension 05/11/2014   HLD (hyperlipidemia) 05/11/2014   Hypothyroidism 05/11/2014   Arthritis 05/11/2014   Obstructive sleep apnea 05/11/2014   PCP:  Venia Carbon, MD Pharmacy:   [id number] Lorina Rabon, [place] -  [place] [place] [address] Phone: [phone] Fax: [phone]     Social Determinants of Health (SDOH) Interventions    Readmission Risk Interventions No flowsheet data found.

## 2020-09-21 NOTE — Consult Note (Signed)
[place]  [phone] [phone]  ID: Judith Ross OB: September 03, 1955  MR#: [id number]  [id number]#:[id number]  Patient Care Team: Venia Carbon, MD as PCP - General (Internal Medicine) Clent Jacks, RN as Oncology Nurse Navigator Grayland Ormond, Kathlene November, MD as Consulting Physician (Hematology and Oncology)  CHIEF COMPLAINT: Stage IV colon cancer, pulmonary embolus.  INTERVAL HISTORY: Patient is a 65 year old female actively receiving chemotherapy for stage IV colon cancer was recently admitted to the hospital with worsening shortness of breath and found to have a submassive pulmonary embolism.  She still has an oxygen requirement, but feels significantly improved since admission.  She has no neurologic complaints.  She denies any recent fevers illnesses.  She has a fair appetite, but denies weight loss.  She has no chest pain,, hemoptysis, cough.  She denies any nausea, vomiting, constipation, or diarrhea.  She has no urinary complaints.  Patient otherwise feels well and offers no further specific complaints today.  REVIEW OF SYSTEMS:   Review of Systems  Constitutional: Negative.  Negative for fever, malaise/fatigue and weight loss.  Respiratory:  Positive for shortness of breath. Negative for cough and hemoptysis.   Cardiovascular: Negative.  Negative for chest pain and leg swelling.  Gastrointestinal:  Negative for abdominal pain.  Genitourinary: Negative.  Negative for dysuria.  Musculoskeletal: Negative.  Negative for back pain.  Skin: Negative.  Negative for rash.  Neurological: Negative.  Negative for dizziness, focal weakness, weakness and headaches.  Psychiatric/Behavioral:  The patient is not nervous/anxious.    As per HPI. Otherwise, a complete review of systems is negative.  PAST MEDICAL HISTORY: Past Medical History:  Diagnosis Date   Arthritis    Chicken pox    Helicobacter pylori gastritis    Hyperlipidemia    Hypertension     Metastatic colon cancer in female [facility])    Obesity    Sleep apnea    Thyroid disease     PAST SURGICAL HISTORY: Past Surgical History:  Procedure Laterality Date   ABDOMINAL HYSTERECTOMY  12/2009   total   COLONOSCOPY WITH PROPOFOL N/A 08/13/2020   Procedure: COLONOSCOPY WITH PROPOFOL;  Surgeon: Jonathon Bellows, MD;  Location: [facility] ENDOSCOPY;  Service: Gastroenterology;  Laterality: N/A;   ESOPHAGOGASTRODUODENOSCOPY (EGD) WITH PROPOFOL N/A 08/13/2020   Procedure: ESOPHAGOGASTRODUODENOSCOPY (EGD) WITH PROPOFOL;  Surgeon: Jonathon Bellows, MD;  Location: [facility] ENDOSCOPY;  Service: Gastroenterology;  Laterality: N/A;   IR IMAGING GUIDED PORT INSERTION  08/27/2020   supracervical abdominal hysterectomy and bilateral salpingo--oophorectomy 01-17-2010 for fibroids  01/17/2010   TOTAL THYROIDECTOMY  1991-92   TUBAL LIGATION      FAMILY HISTORY: Family History  Problem Relation Age of Onset   Stroke Father    Diabetes Maternal Grandmother    Hypertension Maternal Grandmother    Diabetes Maternal Uncle    Cancer Maternal Aunt        Breast    ADVANCED DIRECTIVES (Y/N):  $Remov'@[id number]'[id number]$ @  HEALTH MAINTENANCE: Social History   Tobacco Use   Smoking status: Never   Smokeless tobacco: Never  Vaping Use   Vaping Use: Never used  Substance Use Topics   Alcohol use: No    Alcohol/week: 0.0 standard drinks   Drug use: No     Colonoscopy:  PAP:  Bone density:  Lipid panel:  Allergies  Allergen Reactions   Erythromycin Itching    Current Facility-Administered Medications  Medication Dose Route Frequency Provider Last Rate Last Admin   (feeding supplement) PROSource Plus liquid 30 mL  30 mL Oral TID BM Flora Lipps, MD   30 mL at 09/21/20 0855   albuterol (VENTOLIN HFA) 108 (90 Base) MCG/ACT inhaler 2 puff  2 puff Inhalation Q2H PRN Harvest Dark, MD       apixaban Arne Cleveland) tablet 10 mg  10 mg Oral BID Tyler Pita, MD   10 mg at 09/21/20 7106   Followed by   Derrill Memo ON 09/26/2020]  apixaban (ELIQUIS) tablet 5 mg  5 mg Oral BID Tyler Pita, MD       atorvastatin (LIPITOR) tablet 10 mg  10 mg Oral QAC lunch Rust-Chester, Huel Cote, NP   10 mg at 09/21/20 1249   Chlorhexidine Gluconate Cloth 2 % PADS 6 each  6 each Topical Daily Tyler Pita, MD   6 each at 09/21/20 0900   docusate sodium (COLACE) capsule 100 mg  100 mg Oral BID PRN Rust-Chester, Huel Cote, NP       feeding supplement (ENSURE ENLIVE / ENSURE PLUS) liquid 237 mL  237 mL Oral TID BM Flora Lipps, MD   237 mL at 09/21/20 0900   levothyroxine (SYNTHROID) tablet 150 mcg  150 mcg Oral Q0600 Rust-Chester, Huel Cote, NP   150 mcg at 09/21/20 0554   multivitamin with minerals tablet 1 tablet  1 tablet Oral Daily Flora Lipps, MD   1 tablet at 09/21/20 0855   pantoprazole (PROTONIX) EC tablet 40 mg  40 mg Oral BID Benita Gutter, RPH   40 mg at 09/21/20 0854   polyethylene glycol (MIRALAX / GLYCOLAX) packet 17 g  17 g Oral Daily PRN Rust-Chester, Britton L, NP        OBJECTIVE: Vitals:   09/21/20 1153 09/21/20 1616  BP: 136/77 128/76  Pulse: 72 72  Resp: 20 16  Temp: 98 F (36.7 C) 98.5 F (36.9 C)  SpO2: 100% 99%     Body mass index is 37.68 kg/m.    ECOG FS:1 - Symptomatic but completely ambulatory  General: Well-developed, well-nourished, no acute distress. Eyes: Pink conjunctiva, anicteric sclera. HEENT: Normocephalic, moist mucous membranes. Lungs: No audible wheezing or coughing. Heart: Regular rate and rhythm. Abdomen: Soft, nontender, no obvious distention. Musculoskeletal: No edema, cyanosis, or clubbing. Neuro: Alert, answering all questions appropriately. Cranial nerves grossly intact. Skin: No rashes or petechiae noted. Psych: Normal affect. Lymphatics: No cervical, calvicular, axillary or inguinal LAD.   LAB RESULTS:  Lab Results  Component Value Date   NA 136 09/21/2020   K 3.1 (L) 09/21/2020   CL 94 (L) 09/21/2020   CO2 33 (H) 09/21/2020   GLUCOSE 96 09/21/2020    BUN 21 09/21/2020   CREATININE 1.10 (H) 09/21/2020   CALCIUM 7.5 (L) 09/21/2020   PROT 7.1 09/19/2020   ALBUMIN 3.1 (L) 09/19/2020   AST 24 09/19/2020   ALT 10 09/19/2020   ALKPHOS 69 09/19/2020   BILITOT 0.8 09/19/2020   GFRNONAA 56 (L) 09/21/2020   GFRAA  01/10/2010    >60        The eGFR has been calculated using the MDRD equation. This calculation has not been validated in all clinical situations. eGFR's persistently <60 mL/min signify possible Chronic Kidney Disease.    Lab Results  Component Value Date   WBC 1.7 (L) 09/20/2020   NEUTROABS 0.3 (LL) 09/18/2020   HGB 10.9 (L) 09/20/2020   HCT 34.0 (L) 09/20/2020   MCV 77.4 (L) 09/20/2020   PLT 134 (L) 09/20/2020     STUDIES: DG Chest 2  View  Result Date: 09/18/2020 CLINICAL DATA:  Shortness of breath. EXAM: CHEST - 2 VIEW COMPARISON:  CT AP 08/02/2020 FINDINGS: There is a right chest wall port catheter with tip in the SVC. Normal heart size. Streaky opacities within the left base may represent atelectasis and or airspace disease. Pulmonary vascular congestion. Right lung appears clear. The visualized osseous structures are unremarkable. IMPRESSION: 1. Left base atelectasis and/or airspace disease. 2. Pulmonary vascular congestion. Electronically Signed   By: Kerby Moors M.D.   On: 09/18/2020 18:32   CT Angio Chest PE W and/or Wo Contrast  Result Date: 09/18/2020 CLINICAL DATA:  PE suspected, high prob Shortness of breath for 2 weeks. Colon cancer with last chemotherapy last week. Weakness. EXAM: CT ANGIOGRAPHY CHEST WITH CONTRAST TECHNIQUE: Multidetector CT imaging of the chest was performed using the standard protocol during bolus administration of intravenous contrast. Multiplanar CT image reconstructions and MIPs were obtained to evaluate the vascular anatomy. CONTRAST:  41mL OMNIPAQUE IOHEXOL 350 MG/ML SOLN COMPARISON:  Radiograph earlier today.  PET CT 08/25/2020 FINDINGS: Cardiovascular: Examination is positive for  pulmonary embolus. There is a thin saddle embolus straddling the main right and left pulmonary arteries, with clot in the distal main extending into all lobar and many segmental and subsegmental branches. Thromboembolic burden is moderate to large. There is dilatation of the main pulmonary artery at 3.4 cm. Multi chamber cardiomegaly. There is right heart strain with RV to LV ratio of 1.89. Trace pericardial fluid. Right chest port is in place, the tip is obscured by dense IV contrast in the SVC. Mediastinum/Nodes: No enlarged mediastinal or hilar lymph nodes. No esophageal wall thickening. Thyroidectomy without soft tissue density in the thyroid bed. Lungs/Pleura: Moderate size left pleural effusion is similar to recent PET. Associated compressive atelectasis. There is no pleural nodularity or enhancement. There also peripheral opacities in the left lower lobe that may represent pulmonary infarct given occlusive emboli in this region. 7 mm lingular nodule was also seen on prior PET, series 6, image 47. Upper Abdomen: Previous ascites adjacent to the left lobe of the liver has resolved in the interim. Slight capsular nodularity of the liver. No definite acute upper abdominal findings. Musculoskeletal: There are no acute or suspicious osseous abnormalities. Mild degenerative disc disease at T9-T10. No focal bone lesion. Subareolar lobulated left breast mass is only partially included in the field of view, similar to recent PET were was not hypermetabolic. Review of the MIP images confirms the above findings. IMPRESSION: 1. Examination is positive for pulmonary embolus with moderate to large clot burden including thin saddle embolus straddling the main right and left pulmonary arteries. There is thrombus in the distal main pulmonary arteries extending into all lobar and many segmental and subsegmental branches. Positive for right heart strain with RV to LV ratio of 1.89. 2. Moderate left pleural effusion with  compressive atelectasis, similar to recent PET. Peripheral opacities in the left lower lobe may represent pulmonary infarct given occlusive emboli in this region. 3. Pulmonary nodule in the lingula measuring 7 mm, also seen on prior PET. Recommend attention at follow-up. 4. Subareolar left breast mass is only partially included in the field of view, similar to recent PET were it was not hypermetabolic. Critical Value/emergent results were called by telephone at the time of interpretation on 09/18/2020 at 10:02 pm to provider [facility] , who verbally acknowledged these results. Electronically Signed   By: Keith Rake M.D.   On: 09/18/2020 22:03   NM PET Image  Initial (PI) Skull Base To Thigh  Result Date: 08/25/2020 CLINICAL DATA:  Subsequent treatment strategy for colorectal carcinoma. EXAM: NUCLEAR MEDICINE PET SKULL BASE TO THIGH TECHNIQUE: 13.1 mCi F-18 FDG was injected intravenously. Full-ring PET imaging was performed from the skull base to thigh after the radiotracer. CT data was obtained and used for attenuation correction and anatomic localization. Fasting blood glucose: 101 mg/dl COMPARISON:  CT 08/02/2020 FINDINGS: Mediastinal blood pool activity: SUV max 2.9 Liver activity: SUV max NA NECK: No hypermetabolic lymph nodes in the neck. Incidental CT findings: none CHEST: Large layering LEFT pleural effusion is new from comparison exam. No hypermetabolic mediastinal lymph nodes. No suspicious pulmonary nodules. Incidental CT findings: There is subareolar mass in the LEFT breast measuring 1.9 cm without metabolic activity. A similar mass in the RIGHT breast measuring 3.7 cm ABDOMEN/PELVIS: peritoneal thickening in the lesser omentum measures 4.4 by 3.0 cm on image 162 with intense metabolic activity (SUV max equal 15). The hypermetabolic activity extends extends to involve a 7 cm segment of transverse colon which is also hypermetabolic with SUV max equal 20.7 (image 168) There is a rim metabolic  activity surrounding the RIGHT hepatic lobe consistent with peritoneal metastasis (SUV max equal 6.2). Scattered omental metastasis along the ventral peritoneal surface. For example midline the level the umbilicus with SUV max equal 12.6. There is heterogeneous fat stranding at this level (image 189). There is hypermetabolic activity along the peritoneal reflections in the deep pelvis associated the small free fluid with SUV max equal 10.9. Lobular mass in the deep pelvis posterior to the bladder without significant metabolic activity is favored the uterus (4.5 cm image 225) Incidental CT findings: No bowel obstruction. No intraperitoneal free air SKELETON: No focal hypermetabolic activity to suggest skeletal metastasis. Incidental CT findings: none IMPRESSION: 1. Extensive omental and peritoneal hypermetabolic metastasis in the abdomen pelvis. 2. Hypermetabolic nodular thickening in the greater and lesser omentum. 3. Hypermetabolic circumferential thickening through the mid transverse colon. 4. Rim of hypointensity surrounding the liver as well as the deep peritoneal pelvis. 5. Mass within the deep pelvis is favor the uterus. 6. New large LEFT pleural effusion. No evidence thoracic metastasis. 7. Soft tissue densities in the subareolar space on LEFT and RIGHT are favored benign breast lesions. Electronically Signed   By: Suzy Bouchard M.D.   On: 08/25/2020 17:04   US Venous Img Lower Bilateral (DVT)  Result Date: 09/19/2020 CLINICAL DATA:  ACUTE PULMONARY EMBOLISM. EXAM: BILATERAL LOWER EXTREMITY VENOUS DOPPLER ULTRASOUND TECHNIQUE: Gray-scale sonography with graded compression, as well as color Doppler and duplex ultrasound were performed to evaluate the lower extremity deep venous systems from the level of the common femoral vein and including the common femoral, femoral, profunda femoral, popliteal and calf veins including the posterior tibial, peroneal and gastrocnemius veins when visible. The superficial  great saphenous vein was also interrogated. Spectral Doppler was utilized to evaluate flow at rest and with distal augmentation maneuvers in the common femoral, femoral and popliteal veins. COMPARISON:  December 28, 2008. FINDINGS: RIGHT LOWER EXTREMITY Common Femoral Vein: No evidence of thrombus. Normal compressibility, respiratory phasicity and response to augmentation. Saphenofemoral Junction: No evidence of thrombus. Normal compressibility and flow on color Doppler imaging. Profunda Femoral Vein: No evidence of thrombus. Normal compressibility and flow on color Doppler imaging. Femoral Vein: No evidence of thrombus. Normal compressibility, respiratory phasicity and response to augmentation. Popliteal Vein: No evidence of thrombus. Normal compressibility, respiratory phasicity and response to augmentation. Calf Veins: No evidence of thrombus.  Normal compressibility and flow on color Doppler imaging. Venous Reflux:  None. Other Findings:  None. LEFT LOWER EXTREMITY Common Femoral Vein: Nonocclusive thrombus is noted. Saphenofemoral Junction: No evidence of thrombus. Normal compressibility and flow on color Doppler imaging. Profunda Femoral Vein: Nonocclusive thrombus is noted. Femoral Vein: Nonocclusive thrombus is noted. Popliteal Vein: Nonocclusive thrombus is noted. Calf Veins: No evidence of thrombus. Normal compressibility and flow on color Doppler imaging. Superficial Great Saphenous Vein: No evidence of thrombus. Normal compressibility. Venous Reflux:  None. Other Findings:  None. IMPRESSION: Nonocclusive deep venous thrombosis is noted in the left common femoral, profunda femoral, superficial femoral and popliteal veins. These results will be called to the ordering clinician or representative by the Radiologist Assistant, and communication documented in the PACS or zVision Dashboard. Electronically Signed   By: Marijo Conception M.D.   On: 09/19/2020 09:42   ECHOCARDIOGRAM COMPLETE  Result Date:  09/19/2020    ECHOCARDIOGRAM REPORT   Patient Name:   MYRIKAL MESSMER Date of Exam: 09/19/2020 Medical Rec #:  [id number]       Height:       66.0 in Accession #:    [id number]      Weight:       227.7 lb Date of Birth:  06/24/1955      BSA:          2.113 m Patient Age:    39 years        BP:           131/78 mmHg Patient Gender: F               HR:           88 bpm. Exam Location:  [facility] Procedure: 2D Echo Indications:     Pulmonary Embolus  History:         Patient has no prior history of Echocardiogram examinations.                  Cancer; Risk Factors:Hypertension, Dyslipidemia and Obesity.  Sonographer:     Wallace Keller Thornton-Maynard Referring Phys:  [id number] BRITTON L RUST-CHESTER Diagnosing Phys: Bartholome Bill MD IMPRESSIONS  1. Left ventricular ejection fraction, by estimation, is 70 to 75%. The left ventricle has hyperdynamic function. The left ventricle has no regional wall motion abnormalities. There is moderate left ventricular hypertrophy. Left ventricular diastolic parameters are consistent with Grade I diastolic dysfunction (impaired relaxation).  2. Right ventricular systolic function is normal. The right ventricular size is mildly enlarged. There is moderately elevated pulmonary artery systolic pressure.  3. Right atrial size was mildly dilated.  4. The mitral valve was not well visualized. Trivial mitral valve regurgitation.  5. The aortic valve is calcified. Aortic valve regurgitation is trivial. Conclusion(s)/Recommendation(s): No intracardiac source of embolism detected on this transthoracic study. A transesophageal echocardiogram is recommended to exclude cardiac source of embolism if clinically indicated. FINDINGS  Left Ventricle: Left ventricular ejection fraction, by estimation, is 70 to 75%. The left ventricle has hyperdynamic function. The left ventricle has no regional wall motion abnormalities. The left ventricular internal cavity size was normal in size. There is moderate left ventricular  hypertrophy. Left ventricular diastolic parameters are consistent with Grade I diastolic dysfunction (impaired relaxation). Right Ventricle: The right ventricular size is mildly enlarged. No increase in right ventricular wall thickness. Right ventricular systolic function is normal. There is moderately elevated pulmonary artery systolic pressure. The tricuspid regurgitant velocity is 3.05 m/s, and with an assumed right atrial  pressure of 10 mmHg, the estimated right ventricular systolic pressure is 53.6 mmHg. Left Atrium: Left atrial size was normal in size. Right Atrium: Right atrial size was mildly dilated. Pericardium: There is no evidence of pericardial effusion. Mitral Valve: The mitral valve was not well visualized. Trivial mitral valve regurgitation. Tricuspid Valve: The tricuspid valve is not well visualized. Tricuspid valve regurgitation is mild. Aortic Valve: The aortic valve is calcified. Aortic valve regurgitation is trivial. Aortic valve mean gradient measures 5.0 mmHg. Aortic valve peak gradient measures 6.6 mmHg. Aortic valve area, by VTI measures 3.22 cm. Pulmonic Valve: The pulmonic valve was not well visualized. Pulmonic valve regurgitation is trivial. Aorta: The aortic root is normal in size and structure. IAS/Shunts: The atrial septum is grossly normal.  LEFT VENTRICLE PLAX 2D LVIDd:         3.18 cm  Diastology LVIDs:         1.67 cm  LV e' medial:    5.33 cm/s LV PW:         1.57 cm  LV E/e' medial:  7.5 LV IVS:        1.74 cm  LV e' lateral:   10.40 cm/s LVOT diam:     2.20 cm  LV E/e' lateral: 3.8 LV SV:         57 LV SV Index:   27 LVOT Area:     3.80 cm  RIGHT VENTRICLE RV S prime:     9.46 cm/s TAPSE (M-mode): 1.2 cm LEFT ATRIUM             Index      RIGHT ATRIUM           Index LA diam:        3.50 cm 1.66 cm/m RA Area:     16.30 cm LA Vol (A2C):   17.8 ml 8.42 ml/m RA Volume:   44.50 ml  21.06 ml/m LA Vol (A4C):   20.0 ml 9.46 ml/m LA Biplane Vol: 19.1 ml 9.04 ml/m  AORTIC VALVE  AV Area (Vmax):    3.06 cm AV Area (Vmean):   2.51 cm AV Area (VTI):     3.22 cm AV Vmax:           128.00 cm/s AV Vmean:          106.000 cm/s AV VTI:            0.177 m AV Peak Grad:      6.6 mmHg AV Mean Grad:      5.0 mmHg LVOT Vmax:         103.00 cm/s LVOT Vmean:        70.000 cm/s LVOT VTI:          0.150 m LVOT/AV VTI ratio: 0.85  AORTA Ao Root diam: 3.40 cm MITRAL VALVE               TRICUSPID VALVE MV Area (PHT): 5.13 cm    TR Peak grad:   37.2 mmHg MV Decel Time: 148 msec    TR Vmax:        305.00 cm/s MV E velocity: 39.80 cm/s MV A velocity: 60.80 cm/s  SHUNTS MV E/A ratio:  0.65        Systemic VTI:  0.15 m                            Systemic Diam: 2.20 cm Bartholome Bill MD Electronically  signed by Bartholome Bill MD Signature Date/Time: 09/19/2020/10:05:21 AM    Final    IR IMAGING GUIDED PORT INSERTION  Result Date: 08/27/2020 INDICATION: Metastatic colorectal malignancy EXAM: IMPLANTED PORT A CATH PLACEMENT WITH ULTRASOUND AND FLUOROSCOPIC GUIDANCE MEDICATIONS: None ANESTHESIA/SEDATION: Moderate (conscious) sedation was employed during this procedure. A total of Versed 2 mg and Fentanyl 100 mcg was administered intravenously. Moderate Sedation Time: 15 minutes. The patient's level of consciousness and vital signs were monitored continuously by radiology nursing throughout the procedure under my direct supervision. FLUOROSCOPY TIME:  0.3 minutes, (9.83 mGy) COMPLICATIONS: None immediate. PROCEDURE: The procedure, risks, benefits, and alternatives were explained to the patient. Questions regarding the procedure were encouraged and answered. The patient understands and consents to the procedure. A timeout was performed prior to the initiation of the procedure. Patient positioned supine on the angiography table. Right neck and anterior upper chest prepped and draped in the usual sterile fashion. All elements of maximal sterile barrier were utilized including, cap, mask, sterile gown, sterile gloves,  large sterile drape, hand scrubbing and 2% Chlorhexidine for skin cleaning. The right internal jugular vein was evaluated with ultrasound and shown to be patent. A permanent ultrasound image was obtained and placed in the patient's medical record. Local anesthesia was provided with 1% lidocaine with epinephrine. Using sterile gel and a sterile probe cover, the right internal jugular vein was entered with a 21 ga needle during real time ultrasound guidance. 0.018 inch guidewire placed and 21 ga needle exchanged for transitional dilator set. Utilizing fluoroscopy, 0.035 inch guidewire advanced through the needle without difficulty. Attention then turned to the right anterior upper chest. Following local lidocaine administration, a port pocket was created. The catheter was connected to the port and brought from the pocket to the venotomy site through a subcutaneous tunnel. The catheter was cut to size and inserted through the peel-away sheath. The catheter tip was positioned at the cavoatrial junction using fluoroscopic guidance. The port aspirated and flushed well. The port pocket was closed with deep and superficial absorbable suture. The port pocket incision and venotomy sites were also sealed with Dermabond. IMPRESSION: Successful placement of a right internal jugular approach power injectable Port-A-Cath. The catheter is ready for immediate use. Electronically Signed   By: Miachel Roux M.D.   On: 08/27/2020 13:25    ASSESSMENT: Stage IV colon cancer, pulmonary embolus.  PLAN:    1.  Stage IV colon cancer: Patient last received chemo on September 14, 2020.  She has been instructed to keep her previously scheduled follow-up appointment on September 28, 2020 for further evaluation and consideration of her next treatment. 2.  Pulmonary embolus: Agree with Eliquis.  Patient will require long-term anticoagulation, up to 1 year.  Follow-up in the cancer center as above. 3.  Leukopenia: Secondary to recent chemotherapy,  monitor. 4.  Anemia: Mild, monitor. 5.  Thrombocytopenia: Mild, monitor.  Appreciate consult, will follow.   Lloyd Huger, MD   09/21/2020 4:20 PM

## 2020-09-21 NOTE — Care Management Important Message (Signed)
Important Message  Patient Details  Name: Judith Ross MRN: [id number] Date of Birth: Sep 16, 1955   Medicare Important Message Given:  Yes     Dannette Barbara 09/21/2020, 11:36 AM

## 2020-09-21 NOTE — Progress Notes (Signed)
PROGRESS NOTE    Judith Ross  GQQ:[id number] DOB: 12/13/1955 DOA: 09/18/2020 PCP: Venia Carbon, MD    HPI was taken from Dr. Mortimer Fries: 65 yo F presenting to [facility] ED with complaints of worsening shortness of breath over the last 2 weeks. She describes being unable to walk across the room earlier this evening without getting winded and her sister, bedside, encouraged her to come to the hospital. She also describes some intermittent bilateral leg cramping for the last few weeks as well. ED course: Patient found to be hypoxic with SpO2 in the 70's on room air, she is not normally on oxygen at baseline. Patient denied chest pain, sustained leg pain/swelling, abdominal pain/vomiting/diarrhea. Patient was placed on 4 L [place] with improvement of SpO2 into mid 90's. Initial vitals: afebrile at 98.7, Tachypneic at 26, mildly tachycardic at 106, BP 109/71 with SpO2 improved to 94% on 4 L [place]. Significant labs: BUN/Cr elevated, but appear close to baseline at 29/1.57, BNP elevated 360, troponin 496, neutropenic- 1.7, PT/INR- 16.2/ 1.3. CT angio positive for submassive saddle PE with R heart strain. Dr. Kerman Passey spoke with [facility] regarding patient's eligibility for direct TPA/thrombectomy. The patient is not considered a candidate for either intervention. Patient placed on a heparin drip and PCCM consulted for admission.  Assessment & Plan:   Active Problems:   Acute pulmonary embolism (HCC)   Submassive saddle pulmonary embolism: w/ right heart strain in setting of stage IV colon cancer. Continue on eliquis  DVT: nonocclusive in the left common femoral, profunda femoral, superficial & popliteal veins as per b/l LE [place]  Acute hypoxic respiratory failure: likely secondary to above. Continue on supplemental oxygen and wean as tolerated. Encourage incentive spirometry   Stage IV colon cancer: s/p chemo. Management per onco outpatient   Hypokalemia: KCl repleated  Likely AKI: Cr is  trending down from day prior.   Leukopenia: likely secondary to recent chemo. Will check ANC tomorrow. Will continue to monitor     DVT prophylaxis: eliquis  Code Status:  full  Family Communication:  Disposition Plan: likely d/c back home  Level of care: Med-Surg  Status is: Inpatient  Remains inpatient appropriate because:Inpatient level of care appropriate due to severity of illness, still requiring supplemental oxygen & shortness of breath w/ ambulation/exertion   Dispo: The patient is from: Home              Anticipated d/c is to: Home              Patient currently is not medically stable to d/c.   Difficult to place patient: unclear    Consultants:  ICU  Procedures:   Antimicrobials:    Subjective: Pt c/o shortness of breath w/ exertion/ambulating  Objective: Vitals:   09/20/20 2233 09/21/20 0204 09/21/20 0500 09/21/20 0646  BP: 137/82 139/87  127/65  Pulse: 83 73  79  Resp: 18 18  18   Temp: 98.2 F (36.8 C) 97.9 F (36.6 C)  98.5 F (36.9 C)  TempSrc:      SpO2: 97% 98%  98%  Weight:   105.9 kg   Height:        Intake/Output Summary (Last 24 hours) at 09/21/2020 0719 Last data filed at 09/20/2020 1700 Gross per 24 hour  Intake 480 ml  Output 350 ml  Net 130 ml   Filed Weights   09/19/20 0149 09/20/20 0500 09/21/20 0500  Weight: 103.3 kg 105.6 kg 105.9 kg    Examination:  General exam: Appears calm and comfortable. Obese Respiratory system: diminished breath sounds  Cardiovascular system: S1 & S2 +. No rubs, gallops or clicks.  Gastrointestinal system: Abdomen is obese, soft and nontender. Normal bowel sounds heard. Central nervous system: Alert and oriented. Moves all extremities  Psychiatry: Judgement and insight appear normal. Flat mood and affect    Data Reviewed: I have personally reviewed following labs and imaging studies  CBC: Recent Labs  Lab 09/14/20 0834 09/17/20 0858 09/18/20 1819 09/19/20 0544 09/20/20 0713  WBC 3.8*  1.8* 1.7* 2.5* 1.7*  NEUTROABS 1.6* 0.5* 0.3*  --   --   HGB 11.6* 11.5* 12.6 11.2* 10.9*  HCT 36.6 36.8 39.5 35.0* 34.0*  MCV 78.0* 78.1* 76.7* 77.4* 77.4*  PLT 318 225 182 154 664*   Basic Metabolic Panel: Recent Labs  Lab 09/17/20 0858 09/18/20 1819 09/18/20 2359 09/19/20 0544 09/20/20 0713 09/21/20 0550  NA 136 136  --  138 137 136  K 3.0* 3.9  --  3.5 3.4* 3.1*  CL 93* 93*  --  95* 94* 94*  CO2 32 31  --  32 29 33*  GLUCOSE 130* 142*  --  100* 101* 96  BUN 28* 29*  --  25* 23 21  CREATININE 1.37* 1.57*  --  1.39* 1.25* 1.10*  CALCIUM 8.3* 8.4*  --  8.1* 7.7* 7.5*  MG 2.1  --   --  2.1 2.0  --   PHOS  --   --  2.6 3.2 2.8  --    GFR: Estimated Creatinine Clearance: 62.7 mL/min (A) (by C-G formula based on SCr of 1.1 mg/dL (H)). Liver Function Tests: Recent Labs  Lab 09/14/20 0834 09/17/20 0858 09/18/20 1819 09/19/20 0544  AST 25 39 35 24  ALT 9 14 13 10   ALKPHOS 82 80 84 69  BILITOT 1.0 0.8 0.9 0.8  PROT 8.2* 7.8 8.3* 7.1  ALBUMIN 3.6 3.4* 3.6 3.1*   No results for input(s): LIPASE, AMYLASE in the last 168 hours. No results for input(s): AMMONIA in the last 168 hours. Coagulation Profile: Recent Labs  Lab 09/18/20 2209  INR 1.3*   Cardiac Enzymes: No results for input(s): CKTOTAL, CKMB, CKMBINDEX, TROPONINI in the last 168 hours. BNP (last 3 results) No results for input(s): PROBNP in the last 8760 hours. HbA1C: No results for input(s): HGBA1C in the last 72 hours. CBG: No results for input(s): GLUCAP in the last 168 hours. Lipid Profile: No results for input(s): CHOL, HDL, LDLCALC, TRIG, CHOLHDL, LDLDIRECT in the last 72 hours. Thyroid Function Tests: No results for input(s): TSH, T4TOTAL, FREET4, T3FREE, THYROIDAB in the last 72 hours. Anemia Panel: No results for input(s): VITAMINB12, FOLATE, FERRITIN, TIBC, IRON, RETICCTPCT in the last 72 hours. Sepsis Labs: No results for input(s): PROCALCITON, LATICACIDVEN in the last 168 hours.  Recent  Results (from the past 240 hour(s))  Resp Panel by RT-PCR (Flu A&B, Covid) Nasopharyngeal Swab     Status: None   Collection Time: 09/18/20 11:50 PM   Specimen: Nasopharyngeal Swab; Nasopharyngeal(NP) swabs in vial transport medium  Result Value Ref Range Status   SARS Coronavirus 2 by RT PCR NEGATIVE NEGATIVE Final    Comment: (NOTE) SARS-CoV-2 target nucleic acids are NOT DETECTED.  The SARS-CoV-2 RNA is generally detectable in upper respiratory specimens during the acute phase of infection. The lowest concentration of SARS-CoV-2 viral copies this assay can detect is 138 copies/mL. A negative result does not preclude SARS-Cov-2 infection and should not be used  as the sole basis for treatment or other patient management decisions. A negative result may occur with  improper specimen collection/handling, submission of specimen other than nasopharyngeal swab, presence of viral mutation(s) within the areas targeted by this assay, and inadequate number of viral copies(<138 copies/mL). A negative result must be combined with clinical observations, patient history, and epidemiological information. The expected result is Negative.  Fact Sheet for Patients:  EntrepreneurPulse.com.au  Fact Sheet for Healthcare Providers:  IncredibleEmployment.be  This test is no t yet approved or cleared by the [place] FDA and  has been authorized for detection and/or diagnosis of SARS-CoV-2 by FDA under an Emergency Use Authorization (EUA). This EUA will remain  in effect (meaning this test can be used) for the duration of the COVID-19 declaration under Section 564(b)(1) of the Act, 21 U.S.C.section 360bbb-3(b)(1), unless the authorization is terminated  or revoked sooner.       Influenza A by PCR NEGATIVE NEGATIVE Final   Influenza B by PCR NEGATIVE NEGATIVE Final    Comment: (NOTE) The Xpert Xpress SARS-CoV-2/FLU/RSV plus assay is intended as an aid in the  diagnosis of influenza from Nasopharyngeal swab specimens and should not be used as a sole basis for treatment. Nasal washings and aspirates are unacceptable for Xpert Xpress SARS-CoV-2/FLU/RSV testing.  Fact Sheet for Patients: EntrepreneurPulse.com.au  Fact Sheet for Healthcare Providers: IncredibleEmployment.be  This test is not yet approved or cleared by the [place] FDA and has been authorized for detection and/or diagnosis of SARS-CoV-2 by FDA under an Emergency Use Authorization (EUA). This EUA will remain in effect (meaning this test can be used) for the duration of the COVID-19 declaration under Section 564(b)(1) of the Act, 21 U.S.C. section 360bbb-3(b)(1), unless the authorization is terminated or revoked.  Performed at [facility], [address] [place], [place] [address]   MRSA Next Gen by PCR, Nasal     Status: None   Collection Time: 09/19/20  2:00 AM   Specimen: Nasal Mucosa; Nasal Swab  Result Value Ref Range Status   MRSA by PCR Next Gen NOT DETECTED NOT DETECTED Final    Comment: (NOTE) The GeneXpert MRSA Assay (FDA approved for NASAL specimens only), is one component of a comprehensive MRSA colonization surveillance program. It is not intended to diagnose MRSA infection nor to guide or monitor treatment for MRSA infections. Test performance is not FDA approved in patients less than 51 years old. Performed at [facility], [address], [place], [place] [address]          Radiology Studies: US Venous Img Lower Bilateral (DVT)  Result Date: 09/19/2020 CLINICAL DATA:  ACUTE PULMONARY EMBOLISM. EXAM: BILATERAL LOWER EXTREMITY VENOUS DOPPLER ULTRASOUND TECHNIQUE: Gray-scale sonography with graded compression, as well as color Doppler and duplex ultrasound were performed to evaluate the lower extremity deep venous systems from the level of the common femoral vein and including the common  femoral, femoral, profunda femoral, popliteal and calf veins including the posterior tibial, peroneal and gastrocnemius veins when visible. The superficial great saphenous vein was also interrogated. Spectral Doppler was utilized to evaluate flow at rest and with distal augmentation maneuvers in the common femoral, femoral and popliteal veins. COMPARISON:  December 28, 2008. FINDINGS: RIGHT LOWER EXTREMITY Common Femoral Vein: No evidence of thrombus. Normal compressibility, respiratory phasicity and response to augmentation. Saphenofemoral Junction: No evidence of thrombus. Normal compressibility and flow on color Doppler imaging. Profunda Femoral Vein: No evidence of thrombus. Normal compressibility and flow on color  Doppler imaging. Femoral Vein: No evidence of thrombus. Normal compressibility, respiratory phasicity and response to augmentation. Popliteal Vein: No evidence of thrombus. Normal compressibility, respiratory phasicity and response to augmentation. Calf Veins: No evidence of thrombus. Normal compressibility and flow on color Doppler imaging. Venous Reflux:  None. Other Findings:  None. LEFT LOWER EXTREMITY Common Femoral Vein: Nonocclusive thrombus is noted. Saphenofemoral Junction: No evidence of thrombus. Normal compressibility and flow on color Doppler imaging. Profunda Femoral Vein: Nonocclusive thrombus is noted. Femoral Vein: Nonocclusive thrombus is noted. Popliteal Vein: Nonocclusive thrombus is noted. Calf Veins: No evidence of thrombus. Normal compressibility and flow on color Doppler imaging. Superficial Great Saphenous Vein: No evidence of thrombus. Normal compressibility. Venous Reflux:  None. Other Findings:  None. IMPRESSION: Nonocclusive deep venous thrombosis is noted in the left common femoral, profunda femoral, superficial femoral and popliteal veins. These results will be called to the ordering clinician or representative by the Radiologist Assistant, and communication documented in  the PACS or zVision Dashboard. Electronically Signed   By: Marijo Conception M.D.   On: 09/19/2020 09:42   ECHOCARDIOGRAM COMPLETE  Result Date: 09/19/2020    ECHOCARDIOGRAM REPORT   Patient Name:   Judith Ross Date of Exam: 09/19/2020 Medical Rec #:  [id number]       Height:       66.0 in Accession #:    [id number]      Weight:       227.7 lb Date of Birth:  07/01/1955      BSA:          2.113 m Patient Age:    39 years        BP:           131/78 mmHg Patient Gender: F               HR:           88 bpm. Exam Location:  [facility] Procedure: 2D Echo Indications:     Pulmonary Embolus  History:         Patient has no prior history of Echocardiogram examinations.                  Cancer; Risk Factors:Hypertension, Dyslipidemia and Obesity.  Sonographer:     Wallace Keller Thornton-Maynard Referring Phys:  [id number] BRITTON L RUST-CHESTER Diagnosing Phys: Bartholome Bill MD IMPRESSIONS  1. Left ventricular ejection fraction, by estimation, is 70 to 75%. The left ventricle has hyperdynamic function. The left ventricle has no regional wall motion abnormalities. There is moderate left ventricular hypertrophy. Left ventricular diastolic parameters are consistent with Grade I diastolic dysfunction (impaired relaxation).  2. Right ventricular systolic function is normal. The right ventricular size is mildly enlarged. There is moderately elevated pulmonary artery systolic pressure.  3. Right atrial size was mildly dilated.  4. The mitral valve was not well visualized. Trivial mitral valve regurgitation.  5. The aortic valve is calcified. Aortic valve regurgitation is trivial. Conclusion(s)/Recommendation(s): No intracardiac source of embolism detected on this transthoracic study. A transesophageal echocardiogram is recommended to exclude cardiac source of embolism if clinically indicated. FINDINGS  Left Ventricle: Left ventricular ejection fraction, by estimation, is 70 to 75%. The left ventricle has hyperdynamic function. The left  ventricle has no regional wall motion abnormalities. The left ventricular internal cavity size was normal in size. There is moderate left ventricular hypertrophy. Left ventricular diastolic parameters are consistent with Grade I diastolic dysfunction (impaired relaxation). Right Ventricle: The right ventricular size is  mildly enlarged. No increase in right ventricular wall thickness. Right ventricular systolic function is normal. There is moderately elevated pulmonary artery systolic pressure. The tricuspid regurgitant velocity is 3.05 m/s, and with an assumed right atrial pressure of 10 mmHg, the estimated right ventricular systolic pressure is 70.3 mmHg. Left Atrium: Left atrial size was normal in size. Right Atrium: Right atrial size was mildly dilated. Pericardium: There is no evidence of pericardial effusion. Mitral Valve: The mitral valve was not well visualized. Trivial mitral valve regurgitation. Tricuspid Valve: The tricuspid valve is not well visualized. Tricuspid valve regurgitation is mild. Aortic Valve: The aortic valve is calcified. Aortic valve regurgitation is trivial. Aortic valve mean gradient measures 5.0 mmHg. Aortic valve peak gradient measures 6.6 mmHg. Aortic valve area, by VTI measures 3.22 cm. Pulmonic Valve: The pulmonic valve was not well visualized. Pulmonic valve regurgitation is trivial. Aorta: The aortic root is normal in size and structure. IAS/Shunts: The atrial septum is grossly normal.  LEFT VENTRICLE PLAX 2D LVIDd:         3.18 cm  Diastology LVIDs:         1.67 cm  LV e' medial:    5.33 cm/s LV PW:         1.57 cm  LV E/e' medial:  7.5 LV IVS:        1.74 cm  LV e' lateral:   10.40 cm/s LVOT diam:     2.20 cm  LV E/e' lateral: 3.8 LV SV:         57 LV SV Index:   27 LVOT Area:     3.80 cm  RIGHT VENTRICLE RV S prime:     9.46 cm/s TAPSE (M-mode): 1.2 cm LEFT ATRIUM             Index      RIGHT ATRIUM           Index LA diam:        3.50 cm 1.66 cm/m RA Area:     16.30 cm LA  Vol (A2C):   17.8 ml 8.42 ml/m RA Volume:   44.50 ml  21.06 ml/m LA Vol (A4C):   20.0 ml 9.46 ml/m LA Biplane Vol: 19.1 ml 9.04 ml/m  AORTIC VALVE AV Area (Vmax):    3.06 cm AV Area (Vmean):   2.51 cm AV Area (VTI):     3.22 cm AV Vmax:           128.00 cm/s AV Vmean:          106.000 cm/s AV VTI:            0.177 m AV Peak Grad:      6.6 mmHg AV Mean Grad:      5.0 mmHg LVOT Vmax:         103.00 cm/s LVOT Vmean:        70.000 cm/s LVOT VTI:          0.150 m LVOT/AV VTI ratio: 0.85  AORTA Ao Root diam: 3.40 cm MITRAL VALVE               TRICUSPID VALVE MV Area (PHT): 5.13 cm    TR Peak grad:   37.2 mmHg MV Decel Time: 148 msec    TR Vmax:        305.00 cm/s MV E velocity: 39.80 cm/s MV A velocity: 60.80 cm/s  SHUNTS MV E/A ratio:  0.65        Systemic VTI:  0.15  m                            Systemic Diam: 2.20 cm Bartholome Bill MD Electronically signed by Bartholome Bill MD Signature Date/Time: 09/19/2020/10:05:21 AM    Final         Scheduled Meds:  (feeding supplement) PROSource Plus  30 mL Oral TID BM   apixaban  10 mg Oral BID   Followed by   Derrill Memo ON 09/26/2020] apixaban  5 mg Oral BID   atorvastatin  10 mg Oral QAC lunch   Chlorhexidine Gluconate Cloth  6 each Topical Daily   feeding supplement  237 mL Oral TID BM   levothyroxine  150 mcg Oral Q0600   multivitamin with minerals  1 tablet Oral Daily   pantoprazole  40 mg Oral BID   Continuous Infusions:   LOS: 3 days    Time spent: 31 mins    Wyvonnia Dusky, MD [facility] Pager [phone]  If 7PM-7AM, please contact night-coverage 09/21/2020, 7:19 AM

## 2020-09-22 ENCOUNTER — Inpatient Hospital Stay: Payer: Medicare Other

## 2020-09-22 DIAGNOSIS — I2692 Saddle embolus of pulmonary artery without acute cor pulmonale: Secondary | ICD-10-CM

## 2020-09-22 DIAGNOSIS — I824Y9 Acute embolism and thrombosis of unspecified deep veins of unspecified proximal lower extremity: Secondary | ICD-10-CM

## 2020-09-22 DIAGNOSIS — T451X5A Adverse effect of antineoplastic and immunosuppressive drugs, initial encounter: Secondary | ICD-10-CM

## 2020-09-22 DIAGNOSIS — D701 Agranulocytosis secondary to cancer chemotherapy: Secondary | ICD-10-CM

## 2020-09-22 LAB — BASIC METABOLIC PANEL
Anion gap: 11 (ref 5–15)
BUN: 18 mg/dL (ref 8–23)
CO2: 31 mmol/L (ref 22–32)
Calcium: 7.7 mg/dL — ABNORMAL LOW (ref 8.9–10.3)
Chloride: 95 mmol/L — ABNORMAL LOW (ref 98–111)
Creatinine, Ser: 0.99 mg/dL (ref 0.44–1.00)
GFR, Estimated: >60 mL/min (ref >60)
Glucose, Bld: 99 mg/dL (ref 70–99)
Potassium: 3.1 mmol/L — ABNORMAL LOW (ref 3.5–5.1)
Sodium: 137 mmol/L (ref 135–145)

## 2020-09-22 LAB — CBC WITH DIFFERENTIAL/PLATELET
Abs Immature Granulocytes: 0 10*3/uL (ref 0.00–0.07)
Basophils Absolute: 0 10*3/uL (ref 0.0–0.1)
Basophils Relative: 1 %
Eosinophils Absolute: 0.1 10*3/uL (ref 0.0–0.5)
Eosinophils Relative: 4 %
HCT: 34.5 % — ABNORMAL LOW (ref 36.0–46.0)
Hemoglobin: 10.9 g/dL — ABNORMAL LOW (ref 12.0–15.0)
Immature Granulocytes: 0 %
Lymphocytes Relative: 54 %
Lymphs Abs: 1.4 10*3/uL (ref 0.7–4.0)
MCH: 24.8 pg — ABNORMAL LOW (ref 26.0–34.0)
MCHC: 31.6 g/dL (ref 30.0–36.0)
MCV: 78.6 fL — ABNORMAL LOW (ref 80.0–100.0)
Monocytes Absolute: 0.2 10*3/uL (ref 0.1–1.0)
Monocytes Relative: 6 %
Neutro Abs: 0.9 10*3/uL — ABNORMAL LOW (ref 1.7–7.7)
Neutrophils Relative %: 35 %
Platelets: 136 10*3/uL — ABNORMAL LOW (ref 150–400)
RBC: 4.39 MIL/uL (ref 3.87–5.11)
RDW: 16.1 % — ABNORMAL HIGH (ref 11.5–15.5)
WBC: 2.6 10*3/uL — ABNORMAL LOW (ref 4.0–10.5)
nRBC: 0 % (ref 0.0–0.2)

## 2020-09-22 MED ORDER — POTASSIUM CHLORIDE CRYS ER 20 MEQ PO TBCR
40.0000 meq | EXTENDED_RELEASE_TABLET | Freq: Once | ORAL | Status: DC
  Filled 2020-09-22: qty 2

## 2020-09-22 MED ORDER — APIXABAN 5 MG PO TABS: 5.0000 mg | ORAL_TABLET | Freq: Two times a day (BID) | ORAL | 0 refills | Status: DC

## 2020-09-22 MED ORDER — ACETAMINOPHEN 325 MG PO TABS: 650.0000 mg | ORAL_TABLET | Freq: Four times a day (QID) | ORAL | Status: DC | PRN

## 2020-09-22 MED ORDER — PANTOPRAZOLE SODIUM 40 MG PO TBEC: 40.0000 mg | DELAYED_RELEASE_TABLET | Freq: Two times a day (BID) | ORAL | 0 refills | Status: AC

## 2020-09-22 MED ORDER — ADULT MULTIVITAMIN W/MINERALS CH: 1.0000 | ORAL_TABLET | Freq: Every day | ORAL | 0 refills | Status: AC

## 2020-09-22 MED ORDER — APIXABAN 5 MG PO TABS: 10.0000 mg | ORAL_TABLET | Freq: Two times a day (BID) | ORAL | 0 refills | Status: DC

## 2020-09-22 MED ORDER — ENSURE ENLIVE PO LIQD: 237.0000 mL | Freq: Three times a day (TID) | ORAL | 12 refills | Status: AC

## 2020-09-22 NOTE — Progress Notes (Signed)
Patient is being discharged home to self care. IVs have been removed, discharge instruction explained and a copy has been given to patient. Waiting on her transportation home to arrive.

## 2020-09-22 NOTE — Discharge Summary (Signed)
Judith Ross HQP:[id number] DOB: April 16, 1955 DOA: 09/18/2020  PCP: Venia Carbon, MD  Admit date: 09/18/2020 Discharge date: 09/22/2020  Admitted From: home Disposition:  home  Recommendations for Outpatient Follow-up:  Follow up with PCP in 1 week Please obtain BMP/CBC in one week Please follow up Dr. Grayland Ormond July 12th as scheduled.     Discharge Condition:Stable CODE STATUS:partial  Diet recommendation: Heart Healthy  Brief/Interim Summary: Per HPI: 65 yo F presenting to [facility] ED with complaints of worsening shortness of breath over the last 2 weeks. She describes being unable to walk across the room earlier this evening without getting winded and her sister, bedside, encouraged her to come to the hospital. She also describes some intermittent bilateral leg cramping for the last few weeks as well. ED course: Patient found to be hypoxic with SpO2 in the 70's on room air, she is not normally on oxygen at baseline.patient was placed on 4LNC .  CT angio positive for submassive saddle PE with R heart strain.Per documentation patient did not receipt TPA/Thrombectomy due to not being a candidate for either intervention.   Submassive saddle pulmonary embolism: w/ right heart strain in setting of stage IV colon cancer. Started on Eliquis loading with maintenance for discharge Patient Ambulating Oxygen Test:  At rest room air =100% Ambulating room air = 98% Collagen was consulted.  They agreed with long-term anticoagulation up to 1 year.  Follow-up with cancer center on July 12.   DVT: nonocclusive in the left common femoral, profunda femoral, superficial & popliteal veins as per b/l LE [place]   Acute hypoxic respiratory failure: likely secondary to above.  Weaned off oxygen and satting 100% on room air at rest and ambulation 98% on room air.    Stage IV colon cancer: s/p chemo. Management per onco outpatient Oncology was consulted-Patient last received chemo on September 14, 2020.  She has been 65 instructed to keep her previously scheduled follow-up appointment on September 28, 2020 for further evaluation and consideration of her next treatment.    Hypokalemia: KCl repleated   Likely AKI: Improved.   Leukopenia: likely secondary to recent chemo.  Follow-up with oncology for monitoring and management.  Discharge Diagnoses:  Active Problems:   Acute pulmonary embolism [facility])    Discharge Instructions  Discharge Instructions     Call MD for:  difficulty breathing, headache or visual disturbances   Complete by: As directed    Diet - low sodium heart healthy   Complete by: As directed    Discharge instructions   Complete by: As directed    Follow up Dr. Grayland Ormond as scheduled. Take Eliquis loading dose 10mg  bid, first dose tonight at 22:00, then when you finish 10mg  course , start maintenance 5mg  twice daily to start on 09/26/20.   Increase activity slowly   Complete by: As directed       Allergies as of 09/22/2020       Reactions   Erythromycin Itching        Medication List     STOP taking these medications    benazepril 20 MG tablet Commonly known as: LOTENSIN   dicyclomine 10 MG capsule Commonly known as: BENTYL   doxycycline 100 MG capsule Commonly known as: MONODOX   hydrochlorothiazide 25 MG tablet Commonly known as: HYDRODIURIL   metroNIDAZOLE 500 MG tablet Commonly known as: FLAGYL   omeprazole 20 MG capsule Commonly known as: PRILOSEC   Pepto-Bismol 262 MG/15ML suspension Generic drug: bismuth subsalicylate   potassium chloride  10 MEQ tablet Commonly known as: KLOR-CON       TAKE these medications    apixaban 5 MG Tabs tablet Commonly known as: ELIQUIS Take 2 tablets (10 mg total) by mouth 2 (two) times daily for 14 doses.   apixaban 5 MG Tabs tablet Commonly known as: ELIQUIS Take 1 tablet (5 mg total) by mouth 2 (two) times daily. Start taking on: September 26, 2020   atorvastatin 10 MG tablet Commonly known as: LIPITOR TAKE 1  TABLET(10 MG) BY MOUTH DAILY What changed: See the new instructions.   feeding supplement Liqd Take 237 mLs by mouth 3 (three) times daily between meals.   levothyroxine 150 MCG tablet Commonly known as: SYNTHROID Take 1 tablet (150 mcg total) by mouth daily.   multivitamin with minerals Tabs tablet Take 1 tablet by mouth daily. Start taking on: September 23, 2020   pantoprazole 40 MG tablet Commonly known as: PROTONIX Take 1 tablet (40 mg total) by mouth 2 (two) times daily.   prochlorperazine 10 MG tablet Commonly known as: COMPAZINE Take 10 mg by mouth 2 (two) times daily as needed for nausea or vomiting.       ASK your doctor about these medications    clobetasol ointment 0.05 % Commonly known as: TEMOVATE Apply to affected area every night for 4 weeks, then every other day for 4 weeks and then twice a week for 4 weeks or until resolution.   oxyCODONE-acetaminophen 5-325 MG tablet Commonly known as: PERCOCET/ROXICET Take 1 tablet by mouth every 4 (four) hours as needed for severe pain.        Follow-up Information     Viviana Simpler I, MD Follow up in 1 week(s).   Specialties: Internal Medicine, Pediatrics Contact information: [place] [address] [phone]                Allergies  Allergen Reactions   Erythromycin Itching    Consultations: Oncology   Procedures/Studies: DG Chest 2 View  Result Date: 09/18/2020 CLINICAL DATA:  Shortness of breath. EXAM: CHEST - 2 VIEW COMPARISON:  CT AP 08/02/2020 FINDINGS: There is a right chest wall port catheter with tip in the SVC. Normal heart size. Streaky opacities within the left base may represent atelectasis and or airspace disease. Pulmonary vascular congestion. Right lung appears clear. The visualized osseous structures are unremarkable. IMPRESSION: 1. Left base atelectasis and/or airspace disease. 2. Pulmonary vascular congestion. Electronically Signed   By: Kerby Moors M.D.    On: 09/18/2020 18:32   CT Angio Chest PE W and/or Wo Contrast  Result Date: 09/18/2020 CLINICAL DATA:  PE suspected, high prob Shortness of breath for 2 weeks. Colon cancer with last chemotherapy last week. Weakness. EXAM: CT ANGIOGRAPHY CHEST WITH CONTRAST TECHNIQUE: Multidetector CT imaging of the chest was performed using the standard protocol during bolus administration of intravenous contrast. Multiplanar CT image reconstructions and MIPs were obtained to evaluate the vascular anatomy. CONTRAST:  25mL OMNIPAQUE IOHEXOL 350 MG/ML SOLN COMPARISON:  Radiograph earlier today.  PET CT 08/25/2020 FINDINGS: Cardiovascular: Examination is positive for pulmonary embolus. There is a thin saddle embolus straddling the main right and left pulmonary arteries, with clot in the distal main extending into all lobar and many segmental and subsegmental branches. Thromboembolic burden is moderate to large. There is dilatation of the main pulmonary artery at 3.4 cm. Multi chamber cardiomegaly. There is right heart strain with RV to LV ratio of 1.89. Trace pericardial fluid. Right chest  port is in place, the tip is obscured by dense IV contrast in the SVC. Mediastinum/Nodes: No enlarged mediastinal or hilar lymph nodes. No esophageal wall thickening. Thyroidectomy without soft tissue density in the thyroid bed. Lungs/Pleura: Moderate size left pleural effusion is similar to recent PET. Associated compressive atelectasis. There is no pleural nodularity or enhancement. There also peripheral opacities in the left lower lobe that may represent pulmonary infarct given occlusive emboli in this region. 7 mm lingular nodule was also seen on prior PET, series 6, image 47. Upper Abdomen: Previous ascites adjacent to the left lobe of the liver has resolved in the interim. Slight capsular nodularity of the liver. No definite acute upper abdominal findings. Musculoskeletal: There are no acute or suspicious osseous abnormalities. Mild  degenerative disc disease at T9-T10. No focal bone lesion. Subareolar lobulated left breast mass is only partially included in the field of view, similar to recent PET were was not hypermetabolic. Review of the MIP images confirms the above findings. IMPRESSION: 1. Examination is positive for pulmonary embolus with moderate to large clot burden including thin saddle embolus straddling the main right and left pulmonary arteries. There is thrombus in the distal main pulmonary arteries extending into all lobar and many segmental and subsegmental branches. Positive for right heart strain with RV to LV ratio of 1.89. 2. Moderate left pleural effusion with compressive atelectasis, similar to recent PET. Peripheral opacities in the left lower lobe may represent pulmonary infarct given occlusive emboli in this region. 3. Pulmonary nodule in the lingula measuring 7 mm, also seen on prior PET. Recommend attention at follow-up. 4. Subareolar left breast mass is only partially included in the field of view, similar to recent PET were it was not hypermetabolic. Critical Value/emergent results were called by telephone at the time of interpretation on 09/18/2020 at 10:02 pm to provider [facility] , who verbally acknowledged these results. Electronically Signed   By: Keith Rake M.D.   On: 09/18/2020 22:03   NM PET Image Initial (PI) Skull Base To Thigh  Result Date: 08/25/2020 CLINICAL DATA:  Subsequent treatment strategy for colorectal carcinoma. EXAM: NUCLEAR MEDICINE PET SKULL BASE TO THIGH TECHNIQUE: 13.1 mCi F-18 FDG was injected intravenously. Full-ring PET imaging was performed from the skull base to thigh after the radiotracer. CT data was obtained and used for attenuation correction and anatomic localization. Fasting blood glucose: 101 mg/dl COMPARISON:  CT 08/02/2020 FINDINGS: Mediastinal blood pool activity: SUV max 2.9 Liver activity: SUV max NA NECK: No hypermetabolic lymph nodes in the neck. Incidental  CT findings: none CHEST: Large layering LEFT pleural effusion is new from comparison exam. No hypermetabolic mediastinal lymph nodes. No suspicious pulmonary nodules. Incidental CT findings: There is subareolar mass in the LEFT breast measuring 1.9 cm without metabolic activity. A similar mass in the RIGHT breast measuring 3.7 cm ABDOMEN/PELVIS: peritoneal thickening in the lesser omentum measures 4.4 by 3.0 cm on image 162 with intense metabolic activity (SUV max equal 15). The hypermetabolic activity extends extends to involve a 7 cm segment of transverse colon which is also hypermetabolic with SUV max equal 20.7 (image 168) There is a rim metabolic activity surrounding the RIGHT hepatic lobe consistent with peritoneal metastasis (SUV max equal 6.2). Scattered omental metastasis along the ventral peritoneal surface. For example midline the level the umbilicus with SUV max equal 12.6. There is heterogeneous fat stranding at this level (image 189). There is hypermetabolic activity along the peritoneal reflections in the deep pelvis associated the small free  fluid with SUV max equal 10.9. Lobular mass in the deep pelvis posterior to the bladder without significant metabolic activity is favored the uterus (4.5 cm image 225) Incidental CT findings: No bowel obstruction. No intraperitoneal free air SKELETON: No focal hypermetabolic activity to suggest skeletal metastasis. Incidental CT findings: none IMPRESSION: 1. Extensive omental and peritoneal hypermetabolic metastasis in the abdomen pelvis. 2. Hypermetabolic nodular thickening in the greater and lesser omentum. 3. Hypermetabolic circumferential thickening through the mid transverse colon. 4. Rim of hypointensity surrounding the liver as well as the deep peritoneal pelvis. 5. Mass within the deep pelvis is favor the uterus. 6. New large LEFT pleural effusion. No evidence thoracic metastasis. 7. Soft tissue densities in the subareolar space on LEFT and RIGHT are  favored benign breast lesions. Electronically Signed   By: Suzy Bouchard M.D.   On: 08/25/2020 17:04   US Venous Img Lower Bilateral (DVT)  Result Date: 09/19/2020 CLINICAL DATA:  ACUTE PULMONARY EMBOLISM. EXAM: BILATERAL LOWER EXTREMITY VENOUS DOPPLER ULTRASOUND TECHNIQUE: Gray-scale sonography with graded compression, as well as color Doppler and duplex ultrasound were performed to evaluate the lower extremity deep venous systems from the level of the common femoral vein and including the common femoral, femoral, profunda femoral, popliteal and calf veins including the posterior tibial, peroneal and gastrocnemius veins when visible. The superficial great saphenous vein was also interrogated. Spectral Doppler was utilized to evaluate flow at rest and with distal augmentation maneuvers in the common femoral, femoral and popliteal veins. COMPARISON:  December 28, 2008. FINDINGS: RIGHT LOWER EXTREMITY Common Femoral Vein: No evidence of thrombus. Normal compressibility, respiratory phasicity and response to augmentation. Saphenofemoral Junction: No evidence of thrombus. Normal compressibility and flow on color Doppler imaging. Profunda Femoral Vein: No evidence of thrombus. Normal compressibility and flow on color Doppler imaging. Femoral Vein: No evidence of thrombus. Normal compressibility, respiratory phasicity and response to augmentation. Popliteal Vein: No evidence of thrombus. Normal compressibility, respiratory phasicity and response to augmentation. Calf Veins: No evidence of thrombus. Normal compressibility and flow on color Doppler imaging. Venous Reflux:  None. Other Findings:  None. LEFT LOWER EXTREMITY Common Femoral Vein: Nonocclusive thrombus is noted. Saphenofemoral Junction: No evidence of thrombus. Normal compressibility and flow on color Doppler imaging. Profunda Femoral Vein: Nonocclusive thrombus is noted. Femoral Vein: Nonocclusive thrombus is noted. Popliteal Vein: Nonocclusive thrombus is  noted. Calf Veins: No evidence of thrombus. Normal compressibility and flow on color Doppler imaging. Superficial Great Saphenous Vein: No evidence of thrombus. Normal compressibility. Venous Reflux:  None. Other Findings:  None. IMPRESSION: Nonocclusive deep venous thrombosis is noted in the left common femoral, profunda femoral, superficial femoral and popliteal veins. These results will be called to the ordering clinician or representative by the Radiologist Assistant, and communication documented in the PACS or zVision Dashboard. Electronically Signed   By: Marijo Conception M.D.   On: 09/19/2020 09:42   DG Chest Port 1 View  Result Date: 09/22/2020 CLINICAL DATA:  Shortness of breath, hypertension, metastatic colon cancer EXAM: PORTABLE CHEST 1 VIEW COMPARISON:  Portable exam 0816 hours compared to 09/18/2020 FINDINGS: RIGHT jugular Port-A-Cath with tip projecting over SVC at the level of the azygos confluence unchanged. Slightly rotated to the RIGHT. Normal heart size, mediastinal contours, and pulmonary vascularity. Persistent subsegmental atelectasis at LEFT base. Remaining lungs clear. No pulmonary infiltrate, pleural effusion, or pneumothorax. Osseous structures unremarkable. IMPRESSION: Persistent subsegmental atelectasis LEFT lung base. Electronically Signed   By: Lavonia Dana M.D.   On: 09/22/2020 10:53  ECHOCARDIOGRAM COMPLETE  Result Date: 09/19/2020    ECHOCARDIOGRAM REPORT   Patient Name:   Judith Ross Date of Exam: 09/19/2020 Medical Rec #:  [id number]       Height:       66.0 in Accession #:    [id number]      Weight:       227.7 lb Date of Birth:  Jan 24, 1956      BSA:          2.113 m Patient Age:    33 years        BP:           131/78 mmHg Patient Gender: F               HR:           88 bpm. Exam Location:  [facility] Procedure: 2D Echo Indications:     Pulmonary Embolus  History:         Patient has no prior history of Echocardiogram examinations.                  Cancer; Risk  Factors:Hypertension, Dyslipidemia and Obesity.  Sonographer:     Wallace Keller Thornton-Maynard Referring Phys:  [id number] BRITTON L RUST-CHESTER Diagnosing Phys: Bartholome Bill MD IMPRESSIONS  1. Left ventricular ejection fraction, by estimation, is 70 to 75%. The left ventricle has hyperdynamic function. The left ventricle has no regional wall motion abnormalities. There is moderate left ventricular hypertrophy. Left ventricular diastolic parameters are consistent with Grade I diastolic dysfunction (impaired relaxation).  2. Right ventricular systolic function is normal. The right ventricular size is mildly enlarged. There is moderately elevated pulmonary artery systolic pressure.  3. Right atrial size was mildly dilated.  4. The mitral valve was not well visualized. Trivial mitral valve regurgitation.  5. The aortic valve is calcified. Aortic valve regurgitation is trivial. Conclusion(s)/Recommendation(s): No intracardiac source of embolism detected on this transthoracic study. A transesophageal echocardiogram is recommended to exclude cardiac source of embolism if clinically indicated. FINDINGS  Left Ventricle: Left ventricular ejection fraction, by estimation, is 70 to 75%. The left ventricle has hyperdynamic function. The left ventricle has no regional wall motion abnormalities. The left ventricular internal cavity size was normal in size. There is moderate left ventricular hypertrophy. Left ventricular diastolic parameters are consistent with Grade I diastolic dysfunction (impaired relaxation). Right Ventricle: The right ventricular size is mildly enlarged. No increase in right ventricular wall thickness. Right ventricular systolic function is normal. There is moderately elevated pulmonary artery systolic pressure. The tricuspid regurgitant velocity is 3.05 m/s, and with an assumed right atrial pressure of 10 mmHg, the estimated right ventricular systolic pressure is 05.3 mmHg. Left Atrium: Left atrial size was  normal in size. Right Atrium: Right atrial size was mildly dilated. Pericardium: There is no evidence of pericardial effusion. Mitral Valve: The mitral valve was not well visualized. Trivial mitral valve regurgitation. Tricuspid Valve: The tricuspid valve is not well visualized. Tricuspid valve regurgitation is mild. Aortic Valve: The aortic valve is calcified. Aortic valve regurgitation is trivial. Aortic valve mean gradient measures 5.0 mmHg. Aortic valve peak gradient measures 6.6 mmHg. Aortic valve area, by VTI measures 3.22 cm. Pulmonic Valve: The pulmonic valve was not well visualized. Pulmonic valve regurgitation is trivial. Aorta: The aortic root is normal in size and structure. IAS/Shunts: The atrial septum is grossly normal.  LEFT VENTRICLE PLAX 2D LVIDd:         3.18 cm  Diastology LVIDs:  1.67 cm  LV e' medial:    5.33 cm/s LV PW:         1.57 cm  LV E/e' medial:  7.5 LV IVS:        1.74 cm  LV e' lateral:   10.40 cm/s LVOT diam:     2.20 cm  LV E/e' lateral: 3.8 LV SV:         57 LV SV Index:   27 LVOT Area:     3.80 cm  RIGHT VENTRICLE RV S prime:     9.46 cm/s TAPSE (M-mode): 1.2 cm LEFT ATRIUM             Index      RIGHT ATRIUM           Index LA diam:        3.50 cm 1.66 cm/m RA Area:     16.30 cm LA Vol (A2C):   17.8 ml 8.42 ml/m RA Volume:   44.50 ml  21.06 ml/m LA Vol (A4C):   20.0 ml 9.46 ml/m LA Biplane Vol: 19.1 ml 9.04 ml/m  AORTIC VALVE AV Area (Vmax):    3.06 cm AV Area (Vmean):   2.51 cm AV Area (VTI):     3.22 cm AV Vmax:           128.00 cm/s AV Vmean:          106.000 cm/s AV VTI:            0.177 m AV Peak Grad:      6.6 mmHg AV Mean Grad:      5.0 mmHg LVOT Vmax:         103.00 cm/s LVOT Vmean:        70.000 cm/s LVOT VTI:          0.150 m LVOT/AV VTI ratio: 0.85  AORTA Ao Root diam: 3.40 cm MITRAL VALVE               TRICUSPID VALVE MV Area (PHT): 5.13 cm    TR Peak grad:   37.2 mmHg MV Decel Time: 148 msec    TR Vmax:        305.00 cm/s MV E velocity: 39.80 cm/s  MV A velocity: 60.80 cm/s  SHUNTS MV E/A ratio:  0.65        Systemic VTI:  0.15 m                            Systemic Diam: 2.20 cm Bartholome Bill MD Electronically signed by Bartholome Bill MD Signature Date/Time: 09/19/2020/10:05:21 AM    Final    IR IMAGING GUIDED PORT INSERTION  Result Date: 08/27/2020 INDICATION: Metastatic colorectal malignancy EXAM: IMPLANTED PORT A CATH PLACEMENT WITH ULTRASOUND AND FLUOROSCOPIC GUIDANCE MEDICATIONS: None ANESTHESIA/SEDATION: Moderate (conscious) sedation was employed during this procedure. A total of Versed 2 mg and Fentanyl 100 mcg was administered intravenously. Moderate Sedation Time: 15 minutes. The patient's level of consciousness and vital signs were monitored continuously by radiology nursing throughout the procedure under my direct supervision. FLUOROSCOPY TIME:  0.3 minutes, (7.10 mGy) COMPLICATIONS: None immediate. PROCEDURE: The procedure, risks, benefits, and alternatives were explained to the patient. Questions regarding the procedure were encouraged and answered. The patient understands and consents to the procedure. A timeout was performed prior to the initiation of the procedure. Patient positioned supine on the angiography table. Right neck and anterior upper chest prepped and draped in the  usual sterile fashion. All elements of maximal sterile barrier were utilized including, cap, mask, sterile gown, sterile gloves, large sterile drape, hand scrubbing and 2% Chlorhexidine for skin cleaning. The right internal jugular vein was evaluated with ultrasound and shown to be patent. A permanent ultrasound image was obtained and placed in the patient's medical record. Local anesthesia was provided with 1% lidocaine with epinephrine. Using sterile gel and a sterile probe cover, the right internal jugular vein was entered with a 21 ga needle during real time ultrasound guidance. 0.018 inch guidewire placed and 21 ga needle exchanged for transitional dilator set.  Utilizing fluoroscopy, 0.035 inch guidewire advanced through the needle without difficulty. Attention then turned to the right anterior upper chest. Following local lidocaine administration, a port pocket was created. The catheter was connected to the port and brought from the pocket to the venotomy site through a subcutaneous tunnel. The catheter was cut to size and inserted through the peel-away sheath. The catheter tip was positioned at the cavoatrial junction using fluoroscopic guidance. The port aspirated and flushed well. The port pocket was closed with deep and superficial absorbable suture. The port pocket incision and venotomy sites were also sealed with Dermabond. IMPRESSION: Successful placement of a right internal jugular approach power injectable Port-A-Cath. The catheter is ready for immediate use. Electronically Signed   By: Miachel Roux M.D.   On: 08/27/2020 13:25      Subjective: Denies chest pain or shortness of breath.  Discharge Exam: Vitals:   09/22/20 1200 09/22/20 1205  BP:    Pulse:    Resp:    Temp:    SpO2: 100% 98%   Vitals:   09/22/20 1100 09/22/20 1144 09/22/20 1200 09/22/20 1205  BP:  131/80    Pulse:  78    Resp:  15    Temp:  98.2 F (36.8 C)    TempSrc:      SpO2: 97% 96% 100% 98%  Weight:      Height:        General: Pt is alert, awake, not in acute distress Cardiovascular: RRR, S1/S2 +, no rubs, no gallops Respiratory: CTA bilaterally, no wheezing, no rhonchi Abdominal: Soft, NT, ND, bowel sounds + Extremities: no edema    The results of significant diagnostics from this hospitalization (including imaging, microbiology, ancillary and laboratory) are listed below for reference.     Microbiology: Recent Results (from the past 240 hour(s))  Resp Panel by RT-PCR (Flu A&B, Covid) Nasopharyngeal Swab     Status: None   Collection Time: 09/18/20 11:50 PM   Specimen: Nasopharyngeal Swab; Nasopharyngeal(NP) swabs in vial transport medium  Result  Value Ref Range Status   SARS Coronavirus 2 by RT PCR NEGATIVE NEGATIVE Final    Comment: (NOTE) SARS-CoV-2 target nucleic acids are NOT DETECTED.  The SARS-CoV-2 RNA is generally detectable in upper respiratory specimens during the acute phase of infection. The lowest concentration of SARS-CoV-2 viral copies this assay can detect is 138 copies/mL. A negative result does not preclude SARS-Cov-2 infection and should not be used as the sole basis for treatment or other patient management decisions. A negative result may occur with  improper specimen collection/handling, submission of specimen other than nasopharyngeal swab, presence of viral mutation(s) within the areas targeted by this assay, and inadequate number of viral copies(<138 copies/mL). A negative result must be combined with clinical observations, patient history, and epidemiological information. The expected result is Negative.  Fact Sheet for Patients:  EntrepreneurPulse.com.au  Fact Sheet  for Healthcare Providers:  IncredibleEmployment.be  This test is no t yet approved or cleared by the [place] and  has been authorized for detection and/or diagnosis of SARS-CoV-2 by FDA under an Emergency Use Authorization (EUA). This EUA will remain  in effect (meaning this test can be used) for the duration of the COVID-19 declaration under Section 564(b)(1) of the Act, 21 U.S.C.section 360bbb-3(b)(1), unless the authorization is terminated  or revoked sooner.       Influenza A by PCR NEGATIVE NEGATIVE Final   Influenza B by PCR NEGATIVE NEGATIVE Final    Comment: (NOTE) The Xpert Xpress SARS-CoV-2/FLU/RSV plus assay is intended as an aid in the diagnosis of influenza from Nasopharyngeal swab specimens and should not be used as a sole basis for treatment. Nasal washings and aspirates are unacceptable for Xpert Xpress SARS-CoV-2/FLU/RSV testing.  Fact Sheet for  Patients: EntrepreneurPulse.com.au  Fact Sheet for Healthcare Providers: IncredibleEmployment.be  This test is not yet approved or cleared by the [place] FDA and has been authorized for detection and/or diagnosis of SARS-CoV-2 by FDA under an Emergency Use Authorization (EUA). This EUA will remain in effect (meaning this test can be used) for the duration of the COVID-19 declaration under Section 564(b)(1) of the Act, 21 U.S.C. section 360bbb-3(b)(1), unless the authorization is terminated or revoked.  Performed at [facility], [address] [place], [place] [address]   MRSA Next Gen by PCR, Nasal     Status: None   Collection Time: 09/19/20  2:00 AM   Specimen: Nasal Mucosa; Nasal Swab  Result Value Ref Range Status   MRSA by PCR Next Gen NOT DETECTED NOT DETECTED Final    Comment: (NOTE) The GeneXpert MRSA Assay (FDA approved for NASAL specimens only), is one component of a comprehensive MRSA colonization surveillance program. It is not intended to diagnose MRSA infection nor to guide or monitor treatment for MRSA infections. Test performance is not FDA approved in patients less than 53 years old. Performed at [facility], [address] [place], [place] [address]      Labs: BNP (last 3 results) Recent Labs    09/18/20 1819  BNP 983.3*   Basic Metabolic Panel: Recent Labs  Lab 09/17/20 0858 09/18/20 1819 09/18/20 2359 09/19/20 0544 09/20/20 0713 09/21/20 0550 09/22/20 0633  NA 136 136  --  138 137 136 137  K 3.0* 3.9  --  3.5 3.4* 3.1* 3.1*  CL 93* 93*  --  95* 94* 94* 95*  CO2 32 31  --  32 29 33* 31  GLUCOSE 130* 142*  --  100* 101* 96 99  BUN 28* 29*  --  25* 23 21 18   CREATININE 1.37* 1.57*  --  1.39* 1.25* 1.10* 0.99  CALCIUM 8.3* 8.4*  --  8.1* 7.7* 7.5* 7.7*  MG 2.1  --   --  2.1 2.0  --   --   PHOS  --   --  2.6 3.2 2.8  --   --    Liver Function Tests: Recent Labs  Lab  09/17/20 0858 09/18/20 1819 09/19/20 0544  AST 39 35 24  ALT 14 13 10   ALKPHOS 80 84 69  BILITOT 0.8 0.9 0.8  PROT 7.8 8.3* 7.1  ALBUMIN 3.4* 3.6 3.1*   No results for input(s): LIPASE, AMYLASE in the last 168 hours. No results for input(s): AMMONIA in the last 168 hours. CBC: Recent Labs  Lab 09/17/20 0858 09/18/20 1819 09/19/20 0544  09/20/20 0713 09/22/20 0633  WBC 1.8* 1.7* 2.5* 1.7* 2.6*  NEUTROABS 0.5* 0.3*  --   --  0.9*  HGB 11.5* 12.6 11.2* 10.9* 10.9*  HCT 36.8 39.5 35.0* 34.0* 34.5*  MCV 78.1* 76.7* 77.4* 77.4* 78.6*  PLT 225 182 154 134* 136*   Cardiac Enzymes: No results for input(s): CKTOTAL, CKMB, CKMBINDEX, TROPONINI in the last 168 hours. BNP: Invalid input(s): POCBNP CBG: Recent Labs  Lab 09/19/20 0115  GLUCAP 110*   D-Dimer No results for input(s): DDIMER in the last 72 hours. Hgb A1c No results for input(s): HGBA1C in the last 72 hours. Lipid Profile No results for input(s): CHOL, HDL, LDLCALC, TRIG, CHOLHDL, LDLDIRECT in the last 72 hours. Thyroid function studies No results for input(s): TSH, T4TOTAL, T3FREE, THYROIDAB in the last 72 hours.  Invalid input(s): FREET3 Anemia work up No results for input(s): VITAMINB12, FOLATE, FERRITIN, TIBC, IRON, RETICCTPCT in the last 72 hours. Urinalysis    Component Value Date/Time   COLORURINE AMBER (A) 09/14/2020 0834   APPEARANCEUR CLOUDY (A) 09/14/2020 0834   LABSPEC 1.028 09/14/2020 0834   PHURINE 5.0 09/14/2020 0834   GLUCOSEU NEGATIVE 09/14/2020 0834   HGBUR NEGATIVE 09/14/2020 0834   BILIRUBINUR SMALL (A) 09/14/2020 0834   BILIRUBINUR trace 07/14/2020 1141   KETONESUR 5 (A) 09/14/2020 0834   PROTEINUR 30 (A) 09/14/2020 0834   UROBILINOGEN 0.2 07/14/2020 1141   UROBILINOGEN 1.0 08/03/2011 0856   NITRITE NEGATIVE 09/14/2020 0834   LEUKOCYTESUR TRACE (A) 09/14/2020 0834   Sepsis Labs Invalid input(s): PROCALCITONIN,  WBC,  LACTICIDVEN Microbiology Recent Results (from the past 240  hour(s))  Resp Panel by RT-PCR (Flu A&B, Covid) Nasopharyngeal Swab     Status: None   Collection Time: 09/18/20 11:50 PM   Specimen: Nasopharyngeal Swab; Nasopharyngeal(NP) swabs in vial transport medium  Result Value Ref Range Status   SARS Coronavirus 2 by RT PCR NEGATIVE NEGATIVE Final    Comment: (NOTE) SARS-CoV-2 target nucleic acids are NOT DETECTED.  The SARS-CoV-2 RNA is generally detectable in upper respiratory specimens during the acute phase of infection. The lowest concentration of SARS-CoV-2 viral copies this assay can detect is 138 copies/mL. A negative result does not preclude SARS-Cov-2 infection and should not be used as the sole basis for treatment or other patient management decisions. A negative result may occur with  improper specimen collection/handling, submission of specimen other than nasopharyngeal swab, presence of viral mutation(s) within the areas targeted by this assay, and inadequate number of viral copies(<138 copies/mL). A negative result must be combined with clinical observations, patient history, and epidemiological information. The expected result is Negative.  Fact Sheet for Patients:  EntrepreneurPulse.com.au  Fact Sheet for Healthcare Providers:  IncredibleEmployment.be  This test is no t yet approved or cleared by the [place] FDA and  has been authorized for detection and/or diagnosis of SARS-CoV-2 by FDA under an Emergency Use Authorization (EUA). This EUA will remain  in effect (meaning this test can be used) for the duration of the COVID-19 declaration under Section 564(b)(1) of the Act, 21 U.S.C.section 360bbb-3(b)(1), unless the authorization is terminated  or revoked sooner.       Influenza A by PCR NEGATIVE NEGATIVE Final   Influenza B by PCR NEGATIVE NEGATIVE Final    Comment: (NOTE) The Xpert Xpress SARS-CoV-2/FLU/RSV plus assay is intended as an aid in the diagnosis of influenza from  Nasopharyngeal swab specimens and should not be used as a sole basis for treatment. Nasal washings and aspirates  are unacceptable for Xpert Xpress SARS-CoV-2/FLU/RSV testing.  Fact Sheet for Patients: EntrepreneurPulse.com.au  Fact Sheet for Healthcare Providers: IncredibleEmployment.be  This test is not yet approved or cleared by the [place] FDA and has been authorized for detection and/or diagnosis of SARS-CoV-2 by FDA under an Emergency Use Authorization (EUA). This EUA will remain in effect (meaning this test can be used) for the duration of the COVID-19 declaration under Section 564(b)(1) of the Act, 21 U.S.C. section 360bbb-3(b)(1), unless the authorization is terminated or revoked.  Performed at [facility], [address] [place], [place] [address]   MRSA Next Gen by PCR, Nasal     Status: None   Collection Time: 09/19/20  2:00 AM   Specimen: Nasal Mucosa; Nasal Swab  Result Value Ref Range Status   MRSA by PCR Next Gen NOT DETECTED NOT DETECTED Final    Comment: (NOTE) The GeneXpert MRSA Assay (FDA approved for NASAL specimens only), is one component of a comprehensive MRSA colonization surveillance program. It is not intended to diagnose MRSA infection nor to guide or monitor treatment for MRSA infections. Test performance is not FDA approved in patients less than 63 years old. Performed at [facility], [address] [place],  [address]      Time coordinating discharge: Over 30 minutes  SIGNED:   Nolberto Hanlon, MD  [facility] 09/22/2020, 2:00 PM Pager   If 7PM-7AM, please contact night-coverage [email or web address] Password [email or web address]

## 2020-09-22 NOTE — Progress Notes (Signed)
Patient Ambulating Oxygen Test  At rest room air =100% Ambulating room air = 98%

## 2020-09-23 ENCOUNTER — Telehealth: Payer: Self-pay

## 2020-09-23 NOTE — Telephone Encounter (Signed)
Transition Care Management Follow-up Telephone Call Date of discharge and from where: 09/22/2020, [facility] How have you been since you were released from the hospital? Patient is doing much better she states. At home resting comfortably with no complaints at this time.  Any questions or concerns? No  Items Reviewed: Did the pt receive and understand the discharge instructions provided? Yes  Medications obtained and verified? Yes  Other? No  Any new allergies since your discharge? No  Dietary orders reviewed? Yes Do you have support at home? Yes   Home Care and Equipment/Supplies: Were home health services ordered? not applicable If so, what is the name of the agency? N/A  Has the agency set up a time to come to the patient's home? not applicable Were any new equipment or medical supplies ordered?  No What is the name of the medical supply agency? N/A Were you able to get the supplies/equipment? not applicable Do you have any questions related to the use of the equipment or supplies? No  Functional Questionnaire: (I = Independent and D = Dependent) ADLs: I  Bathing/Dressing- I  Meal Prep- I  Eating- I  Maintaining continence- I  Transferring/Ambulation- I  Managing Meds- I  Follow up appointments reviewed:  PCP Hospital f/u appt confirmed? No  Patient is following up with oncology at the moment. [facility] f/u appt confirmed? Yes  Scheduled to see oncology. Are transportation arrangements needed? No  If their condition worsens, is the pt aware to call PCP or go to the Emergency Dept.? Yes Was the patient provided with contact information for the PCP's office or ED? Yes Was to pt encouraged to call back with questions or concerns? Yes

## 2020-09-24 ENCOUNTER — Other Ambulatory Visit: Payer: Self-pay | Admitting: Nurse Practitioner

## 2020-09-24 MED ORDER — POTASSIUM CHLORIDE ER 10 MEQ PO CPCR
10.0000 meq | ORAL_CAPSULE | Freq: Every day | ORAL | 0 refills | Status: DC
Start: 2020-09-24 — End: 2020-10-25

## 2020-09-27 LAB — CULTURE, BLOOD (ROUTINE X 2)
Culture: NO GROWTH
Culture: NO GROWTH
Special Requests: ADEQUATE
Special Requests: ADEQUATE

## 2020-09-28 ENCOUNTER — Telehealth: Payer: Self-pay | Admitting: *Deleted

## 2020-09-28 ENCOUNTER — Other Ambulatory Visit: Payer: Self-pay

## 2020-09-28 ENCOUNTER — Inpatient Hospital Stay (HOSPITAL_BASED_OUTPATIENT_CLINIC_OR_DEPARTMENT_OTHER): Payer: Medicare Other | Admitting: Nurse Practitioner

## 2020-09-28 ENCOUNTER — Inpatient Hospital Stay: Payer: Medicare Other

## 2020-09-28 ENCOUNTER — Encounter: Payer: Self-pay | Admitting: Nurse Practitioner

## 2020-09-28 ENCOUNTER — Ambulatory Visit
Admission: RE | Admit: 2020-09-28 | Discharge: 2020-09-28 | Disposition: A | Discharge status: Home / Self Care | Payer: Medicare Other | Source: Ambulatory Visit | Attending: Nurse Practitioner | Admitting: Nurse Practitioner

## 2020-09-28 VITALS — BP 97/68 | HR 106 | Temp 99.5°F | Resp 16 | Wt 225.8 lb

## 2020-09-28 DIAGNOSIS — T451X5A Adverse effect of antineoplastic and immunosuppressive drugs, initial encounter: Secondary | ICD-10-CM

## 2020-09-28 DIAGNOSIS — C184 Malignant neoplasm of transverse colon: Secondary | ICD-10-CM | POA: Diagnosis not present

## 2020-09-28 DIAGNOSIS — E876 Hypokalemia: Secondary | ICD-10-CM | POA: Diagnosis not present

## 2020-09-28 DIAGNOSIS — C482 Malignant neoplasm of peritoneum, unspecified: Secondary | ICD-10-CM | POA: Diagnosis present

## 2020-09-28 DIAGNOSIS — I82451 Acute embolism and thrombosis of right peroneal vein: Secondary | ICD-10-CM | POA: Diagnosis not present

## 2020-09-28 DIAGNOSIS — Z5111 Encounter for antineoplastic chemotherapy: Secondary | ICD-10-CM | POA: Diagnosis not present

## 2020-09-28 DIAGNOSIS — D701 Agranulocytosis secondary to cancer chemotherapy: Secondary | ICD-10-CM

## 2020-09-28 DIAGNOSIS — D72819 Decreased white blood cell count, unspecified: Secondary | ICD-10-CM | POA: Diagnosis not present

## 2020-09-28 DIAGNOSIS — E8809 Other disorders of plasma-protein metabolism, not elsewhere classified: Secondary | ICD-10-CM | POA: Diagnosis not present

## 2020-09-28 DIAGNOSIS — M79671 Pain in right foot: Secondary | ICD-10-CM | POA: Insufficient documentation

## 2020-09-28 DIAGNOSIS — Z79899 Other long term (current) drug therapy: Secondary | ICD-10-CM | POA: Diagnosis not present

## 2020-09-28 DIAGNOSIS — R000 Tachycardia, unspecified: Secondary | ICD-10-CM | POA: Diagnosis not present

## 2020-09-28 DIAGNOSIS — C785 Secondary malignant neoplasm of large intestine and rectum: Secondary | ICD-10-CM | POA: Diagnosis not present

## 2020-09-28 LAB — CBC WITH DIFFERENTIAL/PLATELET
Abs Immature Granulocytes: 0.03 10*3/uL (ref 0.00–0.07)
Basophils Absolute: 0 10*3/uL (ref 0.0–0.1)
Basophils Relative: 1 %
Eosinophils Absolute: 0 10*3/uL (ref 0.0–0.5)
Eosinophils Relative: 0 %
HCT: 34.6 % — ABNORMAL LOW (ref 36.0–46.0)
Hemoglobin: 10.8 g/dL — ABNORMAL LOW (ref 12.0–15.0)
Immature Granulocytes: 1 %
Lymphocytes Relative: 51 %
Lymphs Abs: 1.9 10*3/uL (ref 0.7–4.0)
MCH: 24.3 pg — ABNORMAL LOW (ref 26.0–34.0)
MCHC: 31.2 g/dL (ref 30.0–36.0)
MCV: 77.9 fL — ABNORMAL LOW (ref 80.0–100.0)
Monocytes Absolute: 1.2 10*3/uL — ABNORMAL HIGH (ref 0.1–1.0)
Monocytes Relative: 32 %
Neutro Abs: 0.5 10*3/uL — ABNORMAL LOW (ref 1.7–7.7)
Neutrophils Relative %: 15 %
Platelets: 400 10*3/uL (ref 150–400)
RBC: 4.44 MIL/uL (ref 3.87–5.11)
RDW: 17.6 % — ABNORMAL HIGH (ref 11.5–15.5)
WBC: 3.6 10*3/uL — ABNORMAL LOW (ref 4.0–10.5)
nRBC: 0 % (ref 0.0–0.2)

## 2020-09-28 LAB — COMPREHENSIVE METABOLIC PANEL
ALT: 14 U/L (ref 0–44)
AST: 28 U/L (ref 15–41)
Albumin: 2.9 g/dL — ABNORMAL LOW (ref 3.5–5.0)
Alkaline Phosphatase: 73 U/L (ref 38–126)
Anion gap: 14 (ref 5–15)
BUN: 14 mg/dL (ref 8–23)
CO2: 27 mmol/L (ref 22–32)
Calcium: 7.8 mg/dL — ABNORMAL LOW (ref 8.9–10.3)
Chloride: 93 mmol/L — ABNORMAL LOW (ref 98–111)
Creatinine, Ser: 1.25 mg/dL — ABNORMAL HIGH (ref 0.44–1.00)
GFR, Estimated: 48 mL/min — ABNORMAL LOW (ref >60)
Glucose, Bld: 122 mg/dL — ABNORMAL HIGH (ref 70–99)
Potassium: 3.2 mmol/L — ABNORMAL LOW (ref 3.5–5.1)
Sodium: 134 mmol/L — ABNORMAL LOW (ref 135–145)
Total Bilirubin: 1.2 mg/dL (ref 0.3–1.2)
Total Protein: 7.3 g/dL (ref 6.5–8.1)

## 2020-09-28 MED ORDER — HEPARIN SOD (PORK) LOCK FLUSH 100 UNIT/ML IV SOLN
500.0000 [IU] | Freq: Once | INTRAVENOUS | Status: AC
Start: 2020-09-28 — End: 2020-09-28
  Administered 2020-09-28: 500 [IU] via INTRAVENOUS
  Filled 2020-09-28: qty 5

## 2020-09-28 MED ORDER — ENOXAPARIN SODIUM 100 MG/ML IJ SOSY: 100.0000 mg | PREFILLED_SYRINGE | Freq: Two times a day (BID) | INTRAMUSCULAR | 0 refills | Status: DC

## 2020-09-28 MED ORDER — SODIUM CHLORIDE 0.9% FLUSH
10.0000 mL | Freq: Once | INTRAVENOUS | Status: AC
Start: 2020-09-28 — End: 2020-09-28
  Administered 2020-09-28: 10 mL via INTRAVENOUS
  Filled 2020-09-28: qty 10

## 2020-09-28 MED ORDER — POTASSIUM CHLORIDE IN NACL 20-0.9 MEQ/L-% IV SOLN
Freq: Once | INTRAVENOUS | Status: AC
Start: 2020-09-28 — End: 2020-09-28
  Filled 2020-09-28: qty 1000

## 2020-09-28 NOTE — Patient Instructions (Signed)
Hypokalemia Hypokalemia means that the amount of potassium in the blood is lower than normal. Potassium is a chemical (electrolyte) that helps regulate the amount of fluid in the body. It also stimulates muscle tightening (contraction) and helps nerves work properly. Normally, most of the body's potassium is inside cells, and only a very small amount is in the blood. Because the amount in the blood is so small, minorchanges to potassium levels in the blood can be life-threatening. What are the causes? This condition may be caused by: Antibiotic medicine. Diarrhea or vomiting. Taking too much of a medicine that helps you have a bowel movement (laxative) can cause diarrhea and lead to hypokalemia. Chronic kidney disease (CKD). Medicines that help the body get rid of excess fluid (diuretics). Eating disorders, such as bulimia. Low magnesium levels in the body. Sweating a lot. What are the signs or symptoms? Symptoms of this condition include: Weakness. Constipation. Fatigue. Muscle cramps. Mental confusion. Skipped heartbeats or irregular heartbeat (palpitations). Tingling or numbness. How is this diagnosed? This condition is diagnosed with a blood test. How is this treated? This condition may be treated by: Taking potassium supplements by mouth. Adjusting the medicines that you take. Eating more foods that contain a lot of potassium. If your potassium level is very low, you may need to get potassium through anIV and be monitored in the hospital. Follow these instructions at home:  Take over-the-counter and prescription medicines only as told by your health care provider. This includes vitamins and supplements. Eat a healthy diet. A healthy diet includes fresh fruits and vegetables, whole grains, healthy fats, and lean proteins. If instructed, eat more foods that contain a lot of potassium. This includes: Nuts, such as peanuts and pistachios. Seeds, such as sunflower seeds and pumpkin  seeds. Peas, lentils, and lima beans. Whole grain and bran cereals and breads. Fresh fruits and vegetables, such as apricots, avocado, bananas, cantaloupe, kiwi, oranges, tomatoes, asparagus, and potatoes. Orange juice. Tomato juice. Red meats. Yogurt. Keep all follow-up visits as told by your health care provider. This is important. Contact a health care provider if you: Have weakness that gets worse. Feel your heart pounding or racing. Vomit. Have diarrhea. Have diabetes (diabetes mellitus) and you have trouble keeping your blood sugar (glucose) in your target range. Get help right away if you: Have chest pain. Have shortness of breath. Have vomiting or diarrhea that lasts for more than 2 days. Faint. Summary Hypokalemia means that the amount of potassium in the blood is lower than normal. This condition is diagnosed with a blood test. Hypokalemia may be treated by taking potassium supplements, adjusting the medicines that you take, or eating more foods that are high in potassium. If your potassium level is very low, you may need to get potassium through an IV and be monitored in the hospital. This information is not intended to replace advice given to you by your health care provider. Make sure you discuss any questions you have with your healthcare provider. Document Revised: 10/17/2017 Document Reviewed: 10/17/2017 Elsevier Patient Education  [place].

## 2020-09-28 NOTE — Telephone Encounter (Signed)
Pt [facility] to cancer center for training/education for self administration of Lovenox injections. Pt was given written instructions as well as hands on teaching. She will be giving herself 100mg /61ml of lovenox every 12 hours. Stressed not to miss any doses. Unfortunately her pharmacy only had 1 dose to give her tonight. The other 19 injections should arrive to [facility] by 12 noon tomm. Instructed to take injection immediately upon getting the medication. RN called [facility] to discuss why patient was only given 1 syringe at pick up this afternoon.

## 2020-09-28 NOTE — Progress Notes (Signed)
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  Patient receiving folfox an zirabev.    Met with patient during infusion.  Patient reports appetite is still decreased.  Reports that she is drinking ensure (thinks it is original) mixed with banana.  Liked boost plus and ensure complete, samples given by RD.  Patient reports shortness of breath when up moving around.  Says she had a bowel movement yesterday but can't remember last movement prior to yesterday ("It has been awhile."). Did not start miralax.  Says that she has been able to eat tomato soup.  Says that she has not been nauseated.      Medications: reviewed  Labs: K 3.2, glucose 122 BUN 14, creatinine 1.25, Calcium 7.8, albumin 2.9  Anthropometrics:   Weight 225 lb today decreased  228 lb 11.2 oz on 6/28 263 lb on 2/1   NUTRITION DIAGNOSIS: Inadequate oral intake continues   INTERVENTION:  Encouraged taking miralax as recommended by MD on last visit. Recommend high calorie shake and reviewed options Provided recipes to patient for smoothies/shakes.  Patient has help with meal preparation.  Also discussed easy to prepare meals (frozen meals, soups, etc) with shortness of breath.     MONITORING, EVALUATION, GOAL: weight trends, intake   NEXT VISIT: Tuesday, July 19th during infusion  Ralynn San B. Zenia Resides, [place], [place] Registered Dietitian [phone] (mobile)

## 2020-09-28 NOTE — Progress Notes (Signed)
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  INTERVAL HISTORY: Patient returns to clinic today for further evaluation and consideration of cycle 3 of FOLFOX plus Avastin.  She had a reaction to oxaliplatin at last treatment and plan to change to irinotecan.  In the interim, she was seen in the hospital for hypoxia and diagnosed with saddle PE with right heart strain and DVT of the left lower extremity.  Not a candidate for thrombectomy, now on Eliquis.  She feels tired and weak.  Right foot pain that is new.  Eating very little.  Shortness of breath with exertion.  Denies nausea, vomiting.  No abdominal pain.  Has not been taking pain medicine.  No dizziness or falls.  No recent fevers or illness.  No chest pain.  No cough or hemoptysis.  No constipation or diarrhea.  No melena or hematochezia.  No urinary complaints.  No further specific complaints today.  REVIEW OF SYSTEMS:   Review of Systems  Constitutional:  Positive for malaise/fatigue and weight loss. Negative for fever.  HENT: Negative.    Respiratory: Negative.  Negative for cough, hemoptysis and shortness of breath.   Cardiovascular: Negative.  Negative for chest pain and leg swelling.  Gastrointestinal: Negative.  Negative for abdominal pain, blood in stool, constipation, diarrhea, melena, nausea and vomiting.  Genitourinary: Negative.  Negative for dysuria.  Musculoskeletal: Negative.  Negative for back pain and myalgias.       Right foot pain and swelling  Skin: Negative.  Negative for rash.  Neurological:  Positive for weakness. Negative for  dizziness, focal weakness and headaches.  Psychiatric/Behavioral:  Negative for depression. The patient is nervous/anxious. The patient does not have insomnia.   As per HPI. Otherwise, a complete review of systems is negative.  PAST MEDICAL HISTORY: Past Medical History:  Diagnosis Date   Arthritis    Chicken pox    Helicobacter pylori gastritis    Hyperlipidemia    Hypertension    Metastatic colon cancer in female [facility]    Obesity    Sleep apnea    Thyroid disease     PAST SURGICAL HISTORY: Past Surgical History:  Procedure Laterality Date   ABDOMINAL HYSTERECTOMY  12/2009   total   COLONOSCOPY WITH PROPOFOL N/A 08/13/2020   Procedure: COLONOSCOPY WITH PROPOFOL;  Surgeon: Jonathon Bellows, MD;  Location: [facility] ENDOSCOPY;  Service: Gastroenterology;  Laterality: N/A;   ESOPHAGOGASTRODUODENOSCOPY (EGD) WITH PROPOFOL N/A 08/13/2020   Procedure: ESOPHAGOGASTRODUODENOSCOPY (EGD) WITH PROPOFOL;  Surgeon: Jonathon Bellows, MD;  Location: [facility] ENDOSCOPY;  Service: Gastroenterology;  Laterality: N/A;   IR IMAGING GUIDED PORT INSERTION  08/27/2020   supracervical abdominal hysterectomy and bilateral salpingo--oophorectomy 01-17-2010 for fibroids  01/17/2010   TOTAL THYROIDECTOMY  1991-92   TUBAL LIGATION      FAMILY HISTORY: Family History  Problem Relation Age of Onset   Stroke Father    Diabetes Maternal Grandmother    Hypertension Maternal Grandmother    Diabetes Maternal Uncle    Cancer Maternal Aunt        Breast    ADVANCED DIRECTIVES (Y/N):  N  HEALTH MAINTENANCE: Social History   Tobacco Use   Smoking  status: Never   Smokeless tobacco: Never  Vaping Use   Vaping Use: Never used  Substance Use Topics   Alcohol use: No    Alcohol/week: 0.0 standard drinks   Drug use: No    Colonoscopy:  PAP:  Bone density:  Lipid panel:  Allergies  Allergen Reactions   Erythromycin Itching    Current Outpatient Medications  Medication Sig Dispense Refill   apixaban (ELIQUIS) 5 MG  TABS tablet Take 1 tablet (5 mg total) by mouth 2 (two) times daily. 60 tablet 0   atorvastatin (LIPITOR) 10 MG tablet TAKE 1 TABLET(10 MG) BY MOUTH DAILY 90 tablet 0   feeding supplement (ENSURE ENLIVE / ENSURE PLUS) LIQD Take 237 mLs by mouth 3 (three) times daily between meals. 237 mL 12   levothyroxine (SYNTHROID) 150 MCG tablet Take 1 tablet (150 mcg total) by mouth daily. 90 tablet 3   pantoprazole (PROTONIX) 40 MG tablet Take 1 tablet (40 mg total) by mouth 2 (two) times daily. 60 tablet 0   prochlorperazine (COMPAZINE) 10 MG tablet Take 10 mg by mouth 2 (two) times daily as needed for nausea or vomiting.     apixaban (ELIQUIS) 5 MG TABS tablet Take 2 tablets (10 mg total) by mouth 2 (two) times daily for 14 doses. (Patient not taking: Reported on 09/28/2020) 14 tablet 0   clobetasol ointment (TEMOVATE) 0.05 % Apply to affected area every night for 4 weeks, then every other day for 4 weeks and then twice a week for 4 weeks or until resolution. (Patient not taking: No sig reported) 30 g 5   Multiple Vitamin (MULTIVITAMIN WITH MINERALS) TABS tablet Take 1 tablet by mouth daily. (Patient not taking: Reported on 09/28/2020) 30 tablet 0   oxyCODONE-acetaminophen (PERCOCET/ROXICET) 5-325 MG tablet Take 1 tablet by mouth every 4 (four) hours as needed for severe pain. (Patient not taking: No sig reported) 30 tablet 0   potassium chloride (MICRO-K) 10 MEQ CR capsule Take 1 capsule (10 mEq total) by mouth daily. (Patient not taking: Reported on 09/28/2020) 30 capsule 0   No current facility-administered medications for this visit.    OBJECTIVE: Vitals:   09/28/20 0905  BP: 97/68  Pulse: (!) 106  Resp: 16  Temp: 99.5 F (37.5 C)  SpO2: 97%     Body mass index is 36.45 kg/m.    ECOG FS:1 - Symptomatic but completely ambulatory  General: fatigued appearing. In wheelchair. Accompanied.  Eyes: Pink conjunctiva, anicteric sclera. Lungs: Clear to auscultation bilaterally. Diminished  bilaterally Heart: tachycardia Abdomen: Soft, nontender, nondistended.  Musculoskeletal: right foot tender to palpation, mild edema, mild redness. Positive homans sign Neuro: Alert, answering all questions appropriately. Cranial nerves grossly intact. Skin: No rashes or petechiae noted. Psych: flat affect   LAB RESULTS:  Lab Results  Component Value Date   NA 134 (L) 09/28/2020   K 3.2 (L) 09/28/2020   CL 93 (L) 09/28/2020   CO2 27 09/28/2020   GLUCOSE 122 (H) 09/28/2020   BUN 14 09/28/2020   CREATININE 1.25 (H) 09/28/2020   CALCIUM 7.8 (L) 09/28/2020   PROT 7.3 09/28/2020   ALBUMIN 2.9 (L) 09/28/2020   AST 28 09/28/2020   ALT 14 09/28/2020   ALKPHOS 73 09/28/2020   BILITOT 1.2 09/28/2020   GFRNONAA 48 (L) 09/28/2020   GFRAA  01/10/2010    >60        The eGFR has been calculated using the MDRD equation. This calculation has not been validated in all clinical  situations. eGFR's persistently <60 mL/min signify possible Chronic Kidney Disease.    Lab Results  Component Value Date   WBC 3.6 (L) 09/28/2020   NEUTROABS 0.5 (L) 09/28/2020   HGB 10.8 (L) 09/28/2020   HCT 34.6 (L) 09/28/2020   MCV 77.9 (L) 09/28/2020   PLT 400 09/28/2020    STUDIES: DG Chest 2 View  Result Date: 09/18/2020 CLINICAL DATA:  Shortness of breath. EXAM: CHEST - 2 VIEW COMPARISON:  CT AP 08/02/2020 FINDINGS: There is a right chest wall port catheter with tip in the SVC. Normal heart size. Streaky opacities within the left base may represent atelectasis and or airspace disease. Pulmonary vascular congestion. Right lung appears clear. The visualized osseous structures are unremarkable. IMPRESSION: 1. Left base atelectasis and/or airspace disease. 2. Pulmonary vascular congestion. Electronically Signed   By: Kerby Moors M.D.   On: 09/18/2020 18:32   CT Angio Chest PE W and/or Wo Contrast  Result Date: 09/18/2020 CLINICAL DATA:  PE suspected, high prob Shortness of breath for 2 weeks. Colon  cancer with last chemotherapy last week. Weakness. EXAM: CT ANGIOGRAPHY CHEST WITH CONTRAST TECHNIQUE: Multidetector CT imaging of the chest was performed using the standard protocol during bolus administration of intravenous contrast. Multiplanar CT image reconstructions and MIPs were obtained to evaluate the vascular anatomy. CONTRAST:  87mL OMNIPAQUE IOHEXOL 350 MG/ML SOLN COMPARISON:  Radiograph earlier today.  PET CT 08/25/2020 FINDINGS: Cardiovascular: Examination is positive for pulmonary embolus. There is a thin saddle embolus straddling the main right and left pulmonary arteries, with clot in the distal main extending into all lobar and many segmental and subsegmental branches. Thromboembolic burden is moderate to large. There is dilatation of the main pulmonary artery at 3.4 cm. Multi chamber cardiomegaly. There is right heart strain with RV to LV ratio of 1.89. Trace pericardial fluid. Right chest port is in place, the tip is obscured by dense IV contrast in the SVC. Mediastinum/Nodes: No enlarged mediastinal or hilar lymph nodes. No esophageal wall thickening. Thyroidectomy without soft tissue density in the thyroid bed. Lungs/Pleura: Moderate size left pleural effusion is similar to recent PET. Associated compressive atelectasis. There is no pleural nodularity or enhancement. There also peripheral opacities in the left lower lobe that may represent pulmonary infarct given occlusive emboli in this region. 7 mm lingular nodule was also seen on prior PET, series 6, image 47. Upper Abdomen: Previous ascites adjacent to the left lobe of the liver has resolved in the interim. Slight capsular nodularity of the liver. No definite acute upper abdominal findings. Musculoskeletal: There are no acute or suspicious osseous abnormalities. Mild degenerative disc disease at T9-T10. No focal bone lesion. Subareolar lobulated left breast mass is only partially included in the field of view, similar to recent PET were was  not hypermetabolic. Review of the MIP images confirms the above findings. IMPRESSION: 1. Examination is positive for pulmonary embolus with moderate to large clot burden including thin saddle embolus straddling the main right and left pulmonary arteries. There is thrombus in the distal main pulmonary arteries extending into all lobar and many segmental and subsegmental branches. Positive for right heart strain with RV to LV ratio of 1.89. 2. Moderate left pleural effusion with compressive atelectasis, similar to recent PET. Peripheral opacities in the left lower lobe may represent pulmonary infarct given occlusive emboli in this region. 3. Pulmonary nodule in the lingula measuring 7 mm, also seen on prior PET. Recommend attention at follow-up. 4. Subareolar left breast mass is only partially  included in the field of view, similar to recent PET were it was not hypermetabolic. Critical Value/emergent results were called by telephone at the time of interpretation on 09/18/2020 at 10:02 pm to provider [facility] , who verbally acknowledged these results. Electronically Signed   By: Narda Rutherford M.D.   On: 09/18/2020 22:03   US Venous Img Lower Bilateral (DVT)  Result Date: 09/19/2020 CLINICAL DATA:  ACUTE PULMONARY EMBOLISM. EXAM: BILATERAL LOWER EXTREMITY VENOUS DOPPLER ULTRASOUND TECHNIQUE: Gray-scale sonography with graded compression, as well as color Doppler and duplex ultrasound were performed to evaluate the lower extremity deep venous systems from the level of the common femoral vein and including the common femoral, femoral, profunda femoral, popliteal and calf veins including the posterior tibial, peroneal and gastrocnemius veins when visible. The superficial great saphenous vein was also interrogated. Spectral Doppler was utilized to evaluate flow at rest and with distal augmentation maneuvers in the common femoral, femoral and popliteal veins. COMPARISON:  December 28, 2008. FINDINGS: RIGHT LOWER  EXTREMITY Common Femoral Vein: No evidence of thrombus. Normal compressibility, respiratory phasicity and response to augmentation. Saphenofemoral Junction: No evidence of thrombus. Normal compressibility and flow on color Doppler imaging. Profunda Femoral Vein: No evidence of thrombus. Normal compressibility and flow on color Doppler imaging. Femoral Vein: No evidence of thrombus. Normal compressibility, respiratory phasicity and response to augmentation. Popliteal Vein: No evidence of thrombus. Normal compressibility, respiratory phasicity and response to augmentation. Calf Veins: No evidence of thrombus. Normal compressibility and flow on color Doppler imaging. Venous Reflux:  None. Other Findings:  None. LEFT LOWER EXTREMITY Common Femoral Vein: Nonocclusive thrombus is noted. Saphenofemoral Junction: No evidence of thrombus. Normal compressibility and flow on color Doppler imaging. Profunda Femoral Vein: Nonocclusive thrombus is noted. Femoral Vein: Nonocclusive thrombus is noted. Popliteal Vein: Nonocclusive thrombus is noted. Calf Veins: No evidence of thrombus. Normal compressibility and flow on color Doppler imaging. Superficial Great Saphenous Vein: No evidence of thrombus. Normal compressibility. Venous Reflux:  None. Other Findings:  None. IMPRESSION: Nonocclusive deep venous thrombosis is noted in the left common femoral, profunda femoral, superficial femoral and popliteal veins. These results will be called to the ordering clinician or representative by the Radiologist Assistant, and communication documented in the PACS or zVision Dashboard. Electronically Signed   By: Lupita Raider M.D.   On: 09/19/2020 09:42   DG Chest Port 1 View  Result Date: 09/22/2020 CLINICAL DATA:  Shortness of breath, hypertension, metastatic colon cancer EXAM: PORTABLE CHEST 1 VIEW COMPARISON:  Portable exam 0816 hours compared to 09/18/2020 FINDINGS: RIGHT jugular Port-A-Cath with tip projecting over SVC at the level of  the azygos confluence unchanged. Slightly rotated to the RIGHT. Normal heart size, mediastinal contours, and pulmonary vascularity. Persistent subsegmental atelectasis at LEFT base. Remaining lungs clear. No pulmonary infiltrate, pleural effusion, or pneumothorax. Osseous structures unremarkable. IMPRESSION: Persistent subsegmental atelectasis LEFT lung base. Electronically Signed   By: Ulyses Southward M.D.   On: 09/22/2020 10:53   ECHOCARDIOGRAM COMPLETE  Result Date: 09/19/2020    ECHOCARDIOGRAM REPORT   Patient Name:   RAYNELL UPTON Date of Exam: 09/19/2020 Medical Rec #:  [id number]       Height:       66.0 in Accession #:    [id number]      Weight:       227.7 lb Date of Birth:  Jan 03, 1956      BSA:          2.113 m Patient Age:  64 years        BP:           131/78 mmHg Patient Gender: F               HR:           88 bpm. Exam Location:  [facility] Procedure: 2D Echo Indications:     Pulmonary Embolus  History:         Patient has no prior history of Echocardiogram examinations.                  Cancer; Risk Factors:Hypertension, Dyslipidemia and Obesity.  Sonographer:     Wallace Keller Thornton-Maynard Referring Phys:  [id number] BRITTON L RUST-CHESTER Diagnosing Phys: Bartholome Bill MD IMPRESSIONS  1. Left ventricular ejection fraction, by estimation, is 70 to 75%. The left ventricle has hyperdynamic function. The left ventricle has no regional wall motion abnormalities. There is moderate left ventricular hypertrophy. Left ventricular diastolic parameters are consistent with Grade I diastolic dysfunction (impaired relaxation).  2. Right ventricular systolic function is normal. The right ventricular size is mildly enlarged. There is moderately elevated pulmonary artery systolic pressure.  3. Right atrial size was mildly dilated.  4. The mitral valve was not well visualized. Trivial mitral valve regurgitation.  5. The aortic valve is calcified. Aortic valve regurgitation is trivial. Conclusion(s)/Recommendation(s): No  intracardiac source of embolism detected on this transthoracic study. A transesophageal echocardiogram is recommended to exclude cardiac source of embolism if clinically indicated. FINDINGS  Left Ventricle: Left ventricular ejection fraction, by estimation, is 70 to 75%. The left ventricle has hyperdynamic function. The left ventricle has no regional wall motion abnormalities. The left ventricular internal cavity size was normal in size. There is moderate left ventricular hypertrophy. Left ventricular diastolic parameters are consistent with Grade I diastolic dysfunction (impaired relaxation). Right Ventricle: The right ventricular size is mildly enlarged. No increase in right ventricular wall thickness. Right ventricular systolic function is normal. There is moderately elevated pulmonary artery systolic pressure. The tricuspid regurgitant velocity is 3.05 m/s, and with an assumed right atrial pressure of 10 mmHg, the estimated right ventricular systolic pressure is 59.9 mmHg. Left Atrium: Left atrial size was normal in size. Right Atrium: Right atrial size was mildly dilated. Pericardium: There is no evidence of pericardial effusion. Mitral Valve: The mitral valve was not well visualized. Trivial mitral valve regurgitation. Tricuspid Valve: The tricuspid valve is not well visualized. Tricuspid valve regurgitation is mild. Aortic Valve: The aortic valve is calcified. Aortic valve regurgitation is trivial. Aortic valve mean gradient measures 5.0 mmHg. Aortic valve peak gradient measures 6.6 mmHg. Aortic valve area, by VTI measures 3.22 cm. Pulmonic Valve: The pulmonic valve was not well visualized. Pulmonic valve regurgitation is trivial. Aorta: The aortic root is normal in size and structure. IAS/Shunts: The atrial septum is grossly normal.  LEFT VENTRICLE PLAX 2D LVIDd:         3.18 cm  Diastology LVIDs:         1.67 cm  LV e' medial:    5.33 cm/s LV PW:         1.57 cm  LV E/e' medial:  7.5 LV IVS:        1.74 cm   LV e' lateral:   10.40 cm/s LVOT diam:     2.20 cm  LV E/e' lateral: 3.8 LV SV:         57 LV SV Index:   27 LVOT Area:     3.80  cm  RIGHT VENTRICLE RV S prime:     9.46 cm/s TAPSE (M-mode): 1.2 cm LEFT ATRIUM             Index      RIGHT ATRIUM           Index LA diam:        3.50 cm 1.66 cm/m RA Area:     16.30 cm LA Vol (A2C):   17.8 ml 8.42 ml/m RA Volume:   44.50 ml  21.06 ml/m LA Vol (A4C):   20.0 ml 9.46 ml/m LA Biplane Vol: 19.1 ml 9.04 ml/m  AORTIC VALVE AV Area (Vmax):    3.06 cm AV Area (Vmean):   2.51 cm AV Area (VTI):     3.22 cm AV Vmax:           128.00 cm/s AV Vmean:          106.000 cm/s AV VTI:            0.177 m AV Peak Grad:      6.6 mmHg AV Mean Grad:      5.0 mmHg LVOT Vmax:         103.00 cm/s LVOT Vmean:        70.000 cm/s LVOT VTI:          0.150 m LVOT/AV VTI ratio: 0.85  AORTA Ao Root diam: 3.40 cm MITRAL VALVE               TRICUSPID VALVE MV Area (PHT): 5.13 cm    TR Peak grad:   37.2 mmHg MV Decel Time: 148 msec    TR Vmax:        305.00 cm/s MV E velocity: 39.80 cm/s MV A velocity: 60.80 cm/s  SHUNTS MV E/A ratio:  0.65        Systemic VTI:  0.15 m                            Systemic Diam: 2.20 cm Bartholome Bill MD Electronically signed by Bartholome Bill MD Signature Date/Time: 09/19/2020/10:05:21 AM    Final      ASSESSMENT: 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  1.  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  Biopsy confirmed adenocarcinoma likely of colon origin.  Colonoscopy on 08/13/2020 revealed transverse colon mass that was biopsied and although morphologically different, consistent with metastatic disease.  CEA elevated at 323.  PET scan from 08/25/2020 confirmed CT results.  Patient elected to proceed with palliative FOLFOX plus Avastin chemotherapy.  Port was placed for administration of chemotherapy. Possible delayed reaction to oxaliplatin  with cycle 2. Plan to switch to irinotecan with cycle 3. Labs reviewed today. Hold treatment based on ANC and acute complaints.   2. Pulmonary Embolism- recent submassive saddle PE w/ right heart strain. Not a candidate for TPA/thrombectomy. On eliquis  3. Left DVT- nonocculsive dvt in the left common femoral, profunda femoral, superficial and popliteal veins. On eliquis as above  4. Right foot pain/Right DVT- positive homans sign, acute pain and swelling. Ultrasound positive for new dvt in right leg, nonocclusive dvt w/I peroneal veins and left common femoral vein. Previously negative imaging of right leg in hospital. DVT while on eliquis. Discussed with DR. Brahmanday who recommends lovenox 1mg /kg twice a day.  Stop eliquis. Patient to return to cancer center for administration  teaching by RN.   5. Neutropenia- ANC 0.5. Hold treatment today. Neutropenic precautions.   6. Anemia- hemoglobin 10.8. Downtrending. Likely related to chemotherapy. Monitor.  7. Hypokalemia- K 3.2 today. Continue oral potassium. Will give 20 meq KCl in clinic today  8. Renal insufficiency: chronic. Creatinine 1.37 today. Monitor.   9. Abdominal pain: related to malignancy. Does not complain of this today. Continue percocet with bowel prophylaxis as needed  10. Nausea and vomiting- related to chemotherapy. Continue antiemetics as prescribed  11. Hypoalbuminemia- related to malnutrition and performance decline. Followed by RD. Encdouraged protein intake.   12. Tachycardia - pressures soft. Likely related to poor fluid intake. Will give IV fluids in clinic today.   Plan:  Right leg u/s stat IV fluids and IV potassium today RTC in 1 week for labs, MD/Finnegan, +/- folfiri+avastin   Patient expressed understanding and was in agreement with this plan. She also understands that She can call clinic at any time with any questions, concerns, or complaints.   Cancer Staging Adenocarcinoma of transverse colon  [facility]) Staging form: Colon and Rectum - Neuroendocine Tumors, AJCC 8th Edition - Clinical stage from 08/18/2020: Stage IV (cTX, cNX, pM1b) - Signed by Lloyd Huger, MD on 08/18/2020 Stage prefix: Initial diagnosis   Verlon Au, NP   09/28/2020 2:15 PM

## 2020-09-28 NOTE — Progress Notes (Signed)
Patient is having an increase in SOBr.  Also has new right foot swelling with pain 10/10 on pain scale that started yesterday.

## 2020-09-30 ENCOUNTER — Inpatient Hospital Stay: Payer: Medicare Other

## 2020-10-05 ENCOUNTER — Inpatient Hospital Stay: Payer: Medicare Other

## 2020-10-05 ENCOUNTER — Inpatient Hospital Stay (HOSPITAL_BASED_OUTPATIENT_CLINIC_OR_DEPARTMENT_OTHER): Payer: Medicare Other | Admitting: Oncology

## 2020-10-05 ENCOUNTER — Other Ambulatory Visit: Payer: Self-pay

## 2020-10-05 ENCOUNTER — Encounter: Payer: Self-pay | Admitting: Oncology

## 2020-10-05 ENCOUNTER — Other Ambulatory Visit: Payer: Self-pay | Admitting: *Deleted

## 2020-10-05 VITALS — BP 120/88 | HR 90 | Temp 97.4°F | Resp 18 | Wt 226.3 lb

## 2020-10-05 DIAGNOSIS — C184 Malignant neoplasm of transverse colon: Secondary | ICD-10-CM

## 2020-10-05 DIAGNOSIS — Z5111 Encounter for antineoplastic chemotherapy: Secondary | ICD-10-CM | POA: Diagnosis not present

## 2020-10-05 LAB — COMPREHENSIVE METABOLIC PANEL
ALT: 14 U/L (ref 0–44)
AST: 30 U/L (ref 15–41)
Albumin: 2.9 g/dL — ABNORMAL LOW (ref 3.5–5.0)
Alkaline Phosphatase: 73 U/L (ref 38–126)
Anion gap: 12 (ref 5–15)
BUN: 9 mg/dL (ref 8–23)
CO2: 27 mmol/L (ref 22–32)
Calcium: 8.1 mg/dL — ABNORMAL LOW (ref 8.9–10.3)
Chloride: 97 mmol/L — ABNORMAL LOW (ref 98–111)
Creatinine, Ser: 1.25 mg/dL — ABNORMAL HIGH (ref 0.44–1.00)
GFR, Estimated: 48 mL/min — ABNORMAL LOW (ref >60)
Glucose, Bld: 92 mg/dL (ref 70–99)
Potassium: 3.3 mmol/L — ABNORMAL LOW (ref 3.5–5.1)
Sodium: 136 mmol/L (ref 135–145)
Total Bilirubin: 0.4 mg/dL (ref 0.3–1.2)
Total Protein: 7.1 g/dL (ref 6.5–8.1)

## 2020-10-05 LAB — CBC WITH DIFFERENTIAL/PLATELET
Abs Immature Granulocytes: 0.48 10*3/uL — ABNORMAL HIGH (ref 0.00–0.07)
Basophils Absolute: 0.1 10*3/uL (ref 0.0–0.1)
Basophils Relative: 1 %
Eosinophils Absolute: 0 10*3/uL (ref 0.0–0.5)
Eosinophils Relative: 1 %
HCT: 35.2 % — ABNORMAL LOW (ref 36.0–46.0)
Hemoglobin: 10.8 g/dL — ABNORMAL LOW (ref 12.0–15.0)
Immature Granulocytes: 6 %
Lymphocytes Relative: 37 %
Lymphs Abs: 2.9 10*3/uL (ref 0.7–4.0)
MCH: 24.3 pg — ABNORMAL LOW (ref 26.0–34.0)
MCHC: 30.7 g/dL (ref 30.0–36.0)
MCV: 79.1 fL — ABNORMAL LOW (ref 80.0–100.0)
Monocytes Absolute: 1.2 10*3/uL — ABNORMAL HIGH (ref 0.1–1.0)
Monocytes Relative: 15 %
Neutro Abs: 3.2 10*3/uL (ref 1.7–7.7)
Neutrophils Relative %: 40 %
Platelets: 495 10*3/uL — ABNORMAL HIGH (ref 150–400)
RBC: 4.45 MIL/uL (ref 3.87–5.11)
RDW: 20.2 % — ABNORMAL HIGH (ref 11.5–15.5)
Smear Review: NORMAL
WBC: 7.9 10*3/uL (ref 4.0–10.5)
nRBC: 0.3 % — ABNORMAL HIGH (ref 0.0–0.2)

## 2020-10-05 LAB — URINALYSIS, DIPSTICK ONLY
Glucose, UA: NEGATIVE mg/dL
Hgb urine dipstick: NEGATIVE
Ketones, ur: 5 mg/dL — AB
Leukocytes,Ua: NEGATIVE
Nitrite: NEGATIVE
Protein, ur: 100 mg/dL — AB
Specific Gravity, Urine: 1.027 (ref 1.005–1.030)
pH: 5 (ref 5.0–8.0)

## 2020-10-05 MED ORDER — ENOXAPARIN SODIUM 100 MG/ML IJ SOSY: 100.0000 mg | PREFILLED_SYRINGE | Freq: Two times a day (BID) | INTRAMUSCULAR | 0 refills | Status: DC

## 2020-10-05 MED ORDER — FLUOROURACIL CHEMO INJECTION 2.5 GM/50ML
400.0000 mg/m2 | Freq: Once | INTRAVENOUS | Status: AC
  Administered 2020-10-05: 900 mg via INTRAVENOUS
  Filled 2020-10-05: qty 18

## 2020-10-05 MED ORDER — DEXAMETHASONE SODIUM PHOSPHATE 100 MG/10ML IJ SOLN
10.0000 mg | Freq: Once | INTRAMUSCULAR | Status: AC
  Administered 2020-10-05: 10 mg via INTRAVENOUS
  Filled 2020-10-05: qty 10

## 2020-10-05 MED ORDER — SODIUM CHLORIDE 0.9 % IV SOLN
2400.0000 mg/m2 | INTRAVENOUS | Status: DC
  Administered 2020-10-05: 5300 mg via INTRAVENOUS
  Filled 2020-10-05: qty 106

## 2020-10-05 MED ORDER — SODIUM CHLORIDE 0.9 % IV SOLN
5.0000 mg/kg | Freq: Once | INTRAVENOUS | Status: AC
  Administered 2020-10-05: 500 mg via INTRAVENOUS
  Filled 2020-10-05: qty 16

## 2020-10-05 MED ORDER — SODIUM CHLORIDE 0.9 % IV SOLN
Freq: Once | INTRAVENOUS | Status: AC
  Filled 2020-10-05: qty 250

## 2020-10-05 MED ORDER — ATROPINE SULFATE 1 MG/ML IJ SOLN
0.5000 mg | Freq: Once | INTRAMUSCULAR | Status: AC | PRN
  Administered 2020-10-05: 0.5 mg via INTRAVENOUS
  Filled 2020-10-05: qty 1

## 2020-10-05 MED ORDER — PALONOSETRON HCL INJECTION 0.25 MG/5ML
0.2500 mg | Freq: Once | INTRAVENOUS | Status: AC
Start: 2020-10-05 — End: 2020-10-05
  Administered 2020-10-05: 0.25 mg via INTRAVENOUS
  Filled 2020-10-05: qty 5

## 2020-10-05 MED ORDER — SODIUM CHLORIDE 0.9 % IV SOLN
409.0000 mg/m2 | Freq: Once | INTRAVENOUS | Status: AC
  Administered 2020-10-05: 900 mg via INTRAVENOUS
  Filled 2020-10-05: qty 45

## 2020-10-05 MED ORDER — SODIUM CHLORIDE 0.9 % IV SOLN
180.0000 mg/m2 | Freq: Once | INTRAVENOUS | Status: AC
  Administered 2020-10-05: 400 mg via INTRAVENOUS
  Filled 2020-10-05: qty 15

## 2020-10-05 NOTE — Progress Notes (Signed)
Follow up today. Pt's dyspnea is much improved. Denies pain from her DVTs. Skin discoloration in her hands from chemotherapy. Denies neuropathy other then sometimes her feet get cold. Appetite is down.Drinking ensure. Will need a refill on lovenox if she is to continue taking.

## 2020-10-05 NOTE — Progress Notes (Signed)
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  Patient receiving folfiri and zirabev.    Met with patient during infusion.  Patient reports that her appetite is about the same.  Taste is the biggest thing effecting appetite.  Denies nausea and shortness of breath has improved.  Drinking 1 ensure shake per day. Forgot to get boost plus.  Yesterday ate TV dinner of corn and mashed potatoes. Had jello and applesauce as well yesterday. Tried a few turnip greens but just drank the juice, due to taste.  Sunday was able to eat 2 chicken legs.  Yesterday ate some popcorn that tasted good.     Medications: reviewed  Labs: reviewed  Anthropometrics:   Weight 226 lb 4.8 oz today  225 lb on 7/12 228 lb 11.2 oz on 6/28 263 lb on 2/1   NUTRITION DIAGNOSIS: Inadequate oral intake continues    INTERVENTION:  Encouraged 1-2 high calorie shakes per day Discussed ways to flavor foods to help with taste.  Encouraged good oral hygiene.      MONITORING, EVALUATION, GOAL: weight trends, intake   NEXT VISIT: to be determined with treatment  Joli B. Allen, RD, LDN Registered Dietitian [phone] (mobile)   

## 2020-10-05 NOTE — Progress Notes (Signed)
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  INTERVAL HISTORY: Patient returns to clinic today for reconsideration of cycle 3 of treatment which is now FOLFIRI plus Avastin.  Patient recently admitted with bilateral pulmonary embolism and DVT.  She continues to have chronic weakness and fatigue, but otherwise feels nearly back to her baseline. She has no neurologic complaints.  She denies any recent fevers or illnesses.  She denies any further chest pain or shortness of breath.  She denies any nausea, vomiting, constipation, or diarrhea.  She has no melena or hematochezia.  She has no urinary complaints.  Patient offers no further specific complaints today.  REVIEW OF SYSTEMS:   Review of Systems  Constitutional:  Positive for malaise/fatigue. Negative for fever and weight loss.  Respiratory: Negative.  Negative for cough, hemoptysis and shortness of breath.   Cardiovascular: Negative.  Negative for chest pain and leg swelling.  Gastrointestinal: Negative.  Negative for abdominal pain, blood in stool, constipation, diarrhea, melena, nausea and vomiting.  Genitourinary: Negative.  Negative for dysuria.  Musculoskeletal: Negative.  Negative for back pain.  Skin: Negative.  Negative for rash.  Neurological:  Positive for weakness. Negative for dizziness, focal weakness and headaches.  Psychiatric/Behavioral: Negative.  The patient is not nervous/anxious.    As per HPI. Otherwise, a complete review of systems is negative.  PAST MEDICAL HISTORY: Past Medical History:  Diagnosis Date   Arthritis     Chicken pox    Helicobacter pylori gastritis    Hyperlipidemia    Hypertension    Metastatic colon cancer in female [facility]    Obesity    Sleep apnea    Thyroid disease     PAST SURGICAL HISTORY: Past Surgical History:  Procedure Laterality Date   ABDOMINAL HYSTERECTOMY  12/2009   total   COLONOSCOPY WITH PROPOFOL N/A 08/13/2020   Procedure: COLONOSCOPY WITH PROPOFOL;  Surgeon: Jonathon Bellows, MD;  Location: [facility] ENDOSCOPY;  Service: Gastroenterology;  Laterality: N/A;   ESOPHAGOGASTRODUODENOSCOPY (EGD) WITH PROPOFOL N/A 08/13/2020   Procedure: ESOPHAGOGASTRODUODENOSCOPY (EGD) WITH PROPOFOL;  Surgeon: Jonathon Bellows, MD;  Location: [facility] ENDOSCOPY;  Service: Gastroenterology;  Laterality: N/A;   IR IMAGING GUIDED PORT INSERTION  08/27/2020   supracervical abdominal hysterectomy and bilateral salpingo--oophorectomy 01-17-2010 for fibroids  01/17/2010   TOTAL THYROIDECTOMY  1991-92   TUBAL LIGATION      FAMILY HISTORY: Family History  Problem Relation Age of Onset   Stroke Father    Diabetes Maternal Grandmother    Hypertension Maternal Grandmother    Diabetes Maternal Uncle    Cancer Maternal Aunt        Breast    ADVANCED DIRECTIVES (Y/N):  N  HEALTH MAINTENANCE: Social History   Tobacco Use   Smoking status: Never   Smokeless tobacco: Never  Vaping Use   Vaping Use: Never used  Substance Use Topics   Alcohol use: No    Alcohol/week: 0.0 standard drinks   Drug use: No     Colonoscopy:  PAP:  Bone density:  Lipid panel:  Allergies  Allergen Reactions   Erythromycin Itching    Current Outpatient Medications  Medication Sig Dispense Refill   atorvastatin (  LIPITOR) 10 MG tablet TAKE 1 TABLET(10 MG) BY MOUTH DAILY 90 tablet 0   clobetasol ointment (TEMOVATE) 0.05 % Apply to affected area every night for 4 weeks, then every other day for 4 weeks and then twice a week for 4 weeks or until resolution. 30 g 5   feeding supplement (ENSURE ENLIVE / ENSURE PLUS) LIQD Take  237 mLs by mouth 3 (three) times daily between meals. 237 mL 12   levothyroxine (SYNTHROID) 150 MCG tablet Take 1 tablet (150 mcg total) by mouth daily. 90 tablet 3   pantoprazole (PROTONIX) 40 MG tablet Take 1 tablet (40 mg total) by mouth 2 (two) times daily. 60 tablet 0   potassium chloride (MICRO-K) 10 MEQ CR capsule Take 1 capsule (10 mEq total) by mouth daily. 30 capsule 0   prochlorperazine (COMPAZINE) 10 MG tablet Take 10 mg by mouth 2 (two) times daily as needed for nausea or vomiting.     enoxaparin (LOVENOX) 100 MG/ML injection Inject 1 mL (100 mg total) into the skin every 12 (twelve) hours. 20 mL 0   Multiple Vitamin (MULTIVITAMIN WITH MINERALS) TABS tablet Take 1 tablet by mouth daily. (Patient not taking: No sig reported) 30 tablet 0   oxyCODONE-acetaminophen (PERCOCET/ROXICET) 5-325 MG tablet Take 1 tablet by mouth every 4 (four) hours as needed for severe pain. (Patient not taking: No sig reported) 30 tablet 0   No current facility-administered medications for this visit.    OBJECTIVE: Vitals:   10/05/20 1151  BP: 120/88  Pulse: 90  Resp: 18  Temp: (!) 97.4 F (36.3 C)  SpO2: 100%     Body mass index is 36.53 kg/m.    ECOG FS:1 - Symptomatic but completely ambulatory  General: Well-developed, well-nourished, no acute distress. Eyes: Pink conjunctiva, anicteric sclera. HEENT: Normocephalic, moist mucous membranes. Lungs: No audible wheezing or coughing. Heart: Regular rate and rhythm. Abdomen: Soft, nontender, no obvious distention. Musculoskeletal: No edema, cyanosis, or clubbing. Neuro: Alert, answering all questions appropriately. Cranial nerves grossly intact. Skin: No rashes or petechiae noted. Psych: Normal affect.   LAB RESULTS:  Lab Results  Component Value Date   NA 136 10/05/2020   K 3.3 (L) 10/05/2020   CL 97 (L) 10/05/2020   CO2 27 10/05/2020   GLUCOSE 92 10/05/2020   BUN 9 10/05/2020   CREATININE 1.25 (H) 10/05/2020   CALCIUM 8.1 (L)  10/05/2020   PROT 7.1 10/05/2020   ALBUMIN 2.9 (L) 10/05/2020   AST 30 10/05/2020   ALT 14 10/05/2020   ALKPHOS 73 10/05/2020   BILITOT 0.4 10/05/2020   GFRNONAA 48 (L) 10/05/2020   GFRAA  01/10/2010    >60        The eGFR has been calculated using the MDRD equation. This calculation has not been validated in all clinical situations. eGFR's persistently <60 mL/min signify possible Chronic Kidney Disease.    Lab Results  Component Value Date   WBC 7.9 10/05/2020   NEUTROABS 3.2 10/05/2020   HGB 10.8 (L) 10/05/2020   HCT 35.2 (L) 10/05/2020   MCV 79.1 (L) 10/05/2020   PLT 495 (H) 10/05/2020     STUDIES: DG Chest 2 View  Result Date: 09/18/2020 CLINICAL DATA:  Shortness of breath. EXAM: CHEST - 2 VIEW COMPARISON:  CT AP 08/02/2020 FINDINGS: There is a right chest wall port catheter with tip in the SVC. Normal heart size. Streaky opacities within the left base may represent atelectasis and or airspace disease. Pulmonary vascular congestion. Right lung appears  clear. The visualized osseous structures are unremarkable. IMPRESSION: 1. Left base atelectasis and/or airspace disease. 2. Pulmonary vascular congestion. Electronically Signed   By: Kerby Moors M.D.   On: 09/18/2020 18:32   CT Angio Chest PE W and/or Wo Contrast  Result Date: 09/18/2020 CLINICAL DATA:  PE suspected, high prob Shortness of breath for 2 weeks. Colon cancer with last chemotherapy last week. Weakness. EXAM: CT ANGIOGRAPHY CHEST WITH CONTRAST TECHNIQUE: Multidetector CT imaging of the chest was performed using the standard protocol during bolus administration of intravenous contrast. Multiplanar CT image reconstructions and MIPs were obtained to evaluate the vascular anatomy. CONTRAST:  34m OMNIPAQUE IOHEXOL 350 MG/ML SOLN COMPARISON:  Radiograph earlier today.  PET CT 08/25/2020 FINDINGS: Cardiovascular: Examination is positive for pulmonary embolus. There is a thin saddle embolus straddling the main right and  left pulmonary arteries, with clot in the distal main extending into all lobar and many segmental and subsegmental branches. Thromboembolic burden is moderate to large. There is dilatation of the main pulmonary artery at 3.4 cm. Multi chamber cardiomegaly. There is right heart strain with RV to LV ratio of 1.89. Trace pericardial fluid. Right chest port is in place, the tip is obscured by dense IV contrast in the SVC. Mediastinum/Nodes: No enlarged mediastinal or hilar lymph nodes. No esophageal wall thickening. Thyroidectomy without soft tissue density in the thyroid bed. Lungs/Pleura: Moderate size left pleural effusion is similar to recent PET. Associated compressive atelectasis. There is no pleural nodularity or enhancement. There also peripheral opacities in the left lower lobe that may represent pulmonary infarct given occlusive emboli in this region. 7 mm lingular nodule was also seen on prior PET, series 6, image 47. Upper Abdomen: Previous ascites adjacent to the left lobe of the liver has resolved in the interim. Slight capsular nodularity of the liver. No definite acute upper abdominal findings. Musculoskeletal: There are no acute or suspicious osseous abnormalities. Mild degenerative disc disease at T9-T10. No focal bone lesion. Subareolar lobulated left breast mass is only partially included in the field of view, similar to recent PET were was not hypermetabolic. Review of the MIP images confirms the above findings. IMPRESSION: 1. Examination is positive for pulmonary embolus with moderate to large clot burden including thin saddle embolus straddling the main right and left pulmonary arteries. There is thrombus in the distal main pulmonary arteries extending into all lobar and many segmental and subsegmental branches. Positive for right heart strain with RV to LV ratio of 1.89. 2. Moderate left pleural effusion with compressive atelectasis, similar to recent PET. Peripheral opacities in the left lower  lobe may represent pulmonary infarct given occlusive emboli in this region. 3. Pulmonary nodule in the lingula measuring 7 mm, also seen on prior PET. Recommend attention at follow-up. 4. Subareolar left breast mass is only partially included in the field of view, similar to recent PET were it was not hypermetabolic. Critical Value/emergent results were called by telephone at the time of interpretation on 09/18/2020 at 10:02 pm [facility] who verbally acknowledged these results. Electronically Signed   By: MKeith RakeM.D.   On: 09/18/2020 22:03   [place]Venous Img Lower Bilateral (DVT)  Result Date: 09/19/2020 CLINICAL DATA:  ACUTE PULMONARY EMBOLISM. EXAM: BILATERAL LOWER EXTREMITY VENOUS DOPPLER ULTRASOUND TECHNIQUE: Gray-scale sonography with graded compression, as well as color Doppler and duplex ultrasound were performed to evaluate the lower extremity deep venous systems from the level of the common femoral vein and including the common femoral,  femoral, profunda femoral, popliteal and calf veins including the posterior tibial, peroneal and gastrocnemius veins when visible. The superficial great saphenous vein was also interrogated. Spectral Doppler was utilized to evaluate flow at rest and with distal augmentation maneuvers in the common femoral, femoral and popliteal veins. COMPARISON:  December 28, 2008. FINDINGS: RIGHT LOWER EXTREMITY Common Femoral Vein: No evidence of thrombus. Normal compressibility, respiratory phasicity and response to augmentation. Saphenofemoral Junction: No evidence of thrombus. Normal compressibility and flow on color Doppler imaging. Profunda Femoral Vein: No evidence of thrombus. Normal compressibility and flow on color Doppler imaging. Femoral Vein: No evidence of thrombus. Normal compressibility, respiratory phasicity and response to augmentation. Popliteal Vein: No evidence of thrombus. Normal compressibility, respiratory phasicity and response to  augmentation. Calf Veins: No evidence of thrombus. Normal compressibility and flow on color Doppler imaging. Venous Reflux:  None. Other Findings:  None. LEFT LOWER EXTREMITY Common Femoral Vein: Nonocclusive thrombus is noted. Saphenofemoral Junction: No evidence of thrombus. Normal compressibility and flow on color Doppler imaging. Profunda Femoral Vein: Nonocclusive thrombus is noted. Femoral Vein: Nonocclusive thrombus is noted. Popliteal Vein: Nonocclusive thrombus is noted. Calf Veins: No evidence of thrombus. Normal compressibility and flow on color Doppler imaging. Superficial Great Saphenous Vein: No evidence of thrombus. Normal compressibility. Venous Reflux:  None. Other Findings:  None. IMPRESSION: Nonocclusive deep venous thrombosis is noted in the left common femoral, profunda femoral, superficial femoral and popliteal veins. These results will be called to the ordering clinician or representative by the Radiologist Assistant, and communication documented in the PACS or zVision Dashboard. Electronically Signed   By: Marijo Conception M.D.   On: 09/19/2020 09:42   US Venous Img Lower Unilateral Right  Result Date: 09/28/2020 CLINICAL DATA:  Right foot pain and swelling EXAM: RIGHT LOWER EXTREMITY VENOUS DOPPLER ULTRASOUND TECHNIQUE: Gray-scale sonography with graded compression, as well as color Doppler and duplex ultrasound were performed to evaluate the lower extremity deep venous systems from the level of the common femoral vein and including the common femoral, femoral, profunda femoral, popliteal and calf veins including the posterior tibial, peroneal and gastrocnemius veins when visible. The superficial great saphenous vein was also interrogated. Spectral Doppler was utilized to evaluate flow at rest and with distal augmentation maneuvers in the common femoral, femoral and popliteal veins. COMPARISON:  Recent prior bilateral duplex venous ultrasound 09/19/2020 FINDINGS: Contralateral Common  Femoral Vein: Nonocclusive DVT visualized within the contralateral left common femoral vein. The vessel is only partially compressible. Common Femoral Vein: No evidence of thrombus. Normal compressibility, respiratory phasicity and response to augmentation. Saphenofemoral Junction: No evidence of thrombus. Normal compressibility and flow on color Doppler imaging. Profunda Femoral Vein: No evidence of thrombus. Normal compressibility and flow on color Doppler imaging. Femoral Vein: No evidence of thrombus. Normal compressibility, respiratory phasicity and response to augmentation. Popliteal Vein: No evidence of thrombus. Normal compressibility, respiratory phasicity and response to augmentation. Calf Veins: Eccentric wall thickening is present in the peroneal veins. There is positive flow on color Doppler imaging. Findings are consistent with nonocclusive versus subacute DVT. Superficial Great Saphenous Vein: No evidence of thrombus. Normal compressibility. Venous Reflux:  None. Other Findings:  None. IMPRESSION: 1. Positive for nonocclusive DVT within the peroneal veins of the right calf. 2. Persistent nonocclusive DVT in the contralateral left common femoral vein as seen on recent prior duplex ultrasound imaging. Electronically Signed   By: Jacqulynn Cadet M.D.   On: 09/28/2020 12:37   DG Chest Port 1 View  Result Date:  09/22/2020 CLINICAL DATA:  Shortness of breath, hypertension, metastatic colon cancer EXAM: PORTABLE CHEST 1 VIEW COMPARISON:  Portable exam 0816 hours compared to 09/18/2020 FINDINGS: RIGHT jugular Port-A-Cath with tip projecting over SVC at the level of the azygos confluence unchanged. Slightly rotated to the RIGHT. Normal heart size, mediastinal contours, and pulmonary vascularity. Persistent subsegmental atelectasis at LEFT base. Remaining lungs clear. No pulmonary infiltrate, pleural effusion, or pneumothorax. Osseous structures unremarkable. IMPRESSION: Persistent subsegmental atelectasis  LEFT lung base. Electronically Signed   By: Lavonia Dana M.D.   On: 09/22/2020 10:53   ECHOCARDIOGRAM COMPLETE  Result Date: 09/19/2020    ECHOCARDIOGRAM REPORT   Patient Name:   MORGANN WOODBURN Date of Exam: 09/19/2020 Medical Rec #:  [id number]       Height:       66.0 in Accession #:    [id number]      Weight:       227.7 lb Date of Birth:  1956-01-20      BSA:          2.113 m Patient Age:    65 years        BP:           131/78 mmHg Patient Gender: F               HR:           88 bpm. Exam Location:  [facility] Procedure: 2D Echo Indications:     Pulmonary Embolus  History:         Patient has no prior history of Echocardiogram examinations.                  Cancer; Risk Factors:Hypertension, Dyslipidemia and Obesity.  Sonographer:     Wallace Keller Thornton-Maynard Referring Phys:  [id number] BRITTON L RUST-CHESTER Diagnosing Phys: Bartholome Bill MD IMPRESSIONS  1. Left ventricular ejection fraction, by estimation, is 70 to 75%. The left ventricle has hyperdynamic function. The left ventricle has no regional wall motion abnormalities. There is moderate left ventricular hypertrophy. Left ventricular diastolic parameters are consistent with Grade I diastolic dysfunction (impaired relaxation).  2. Right ventricular systolic function is normal. The right ventricular size is mildly enlarged. There is moderately elevated pulmonary artery systolic pressure.  3. Right atrial size was mildly dilated.  4. The mitral valve was not well visualized. Trivial mitral valve regurgitation.  5. The aortic valve is calcified. Aortic valve regurgitation is trivial. Conclusion(s)/Recommendation(s): No intracardiac source of embolism detected on this transthoracic study. A transesophageal echocardiogram is recommended to exclude cardiac source of embolism if clinically indicated. FINDINGS  Left Ventricle: Left ventricular ejection fraction, by estimation, is 70 to 75%. The left ventricle has hyperdynamic function. The left ventricle has no  regional wall motion abnormalities. The left ventricular internal cavity size was normal in size. There is moderate left ventricular hypertrophy. Left ventricular diastolic parameters are consistent with Grade I diastolic dysfunction (impaired relaxation). Right Ventricle: The right ventricular size is mildly enlarged. No increase in right ventricular wall thickness. Right ventricular systolic function is normal. There is moderately elevated pulmonary artery systolic pressure. The tricuspid regurgitant velocity is 3.05 m/s, and with an assumed right atrial pressure of 10 mmHg, the estimated right ventricular systolic pressure is 86.7 mmHg. Left Atrium: Left atrial size was normal in size. Right Atrium: Right atrial size was mildly dilated. Pericardium: There is no evidence of pericardial effusion. Mitral Valve: The mitral valve was not well visualized. Trivial mitral valve regurgitation. Tricuspid Valve: The  tricuspid valve is not well visualized. Tricuspid valve regurgitation is mild. Aortic Valve: The aortic valve is calcified. Aortic valve regurgitation is trivial. Aortic valve mean gradient measures 5.0 mmHg. Aortic valve peak gradient measures 6.6 mmHg. Aortic valve area, by VTI measures 3.22 cm. Pulmonic Valve: The pulmonic valve was not well visualized. Pulmonic valve regurgitation is trivial. Aorta: The aortic root is normal in size and structure. IAS/Shunts: The atrial septum is grossly normal.  LEFT VENTRICLE PLAX 2D LVIDd:         3.18 cm  Diastology LVIDs:         1.67 cm  LV e' medial:    5.33 cm/s LV PW:         1.57 cm  LV E/e' medial:  7.5 LV IVS:        1.74 cm  LV e' lateral:   10.40 cm/s LVOT diam:     2.20 cm  LV E/e' lateral: 3.8 LV SV:         57 LV SV Index:   27 LVOT Area:     3.80 cm  RIGHT VENTRICLE RV S prime:     9.46 cm/s TAPSE (M-mode): 1.2 cm LEFT ATRIUM             Index      RIGHT ATRIUM           Index LA diam:        3.50 cm 1.66 cm/m RA Area:     16.30 cm LA Vol (A2C):   17.8  ml 8.42 ml/m RA Volume:   44.50 ml  21.06 ml/m LA Vol (A4C):   20.0 ml 9.46 ml/m LA Biplane Vol: 19.1 ml 9.04 ml/m  AORTIC VALVE AV Area (Vmax):    3.06 cm AV Area (Vmean):   2.51 cm AV Area (VTI):     3.22 cm AV Vmax:           128.00 cm/s AV Vmean:          106.000 cm/s AV VTI:            0.177 m AV Peak Grad:      6.6 mmHg AV Mean Grad:      5.0 mmHg LVOT Vmax:         103.00 cm/s LVOT Vmean:        70.000 cm/s LVOT VTI:          0.150 m LVOT/AV VTI ratio: 0.85  AORTA Ao Root diam: 3.40 cm MITRAL VALVE               TRICUSPID VALVE MV Area (PHT): 5.13 cm    TR Peak grad:   37.2 mmHg MV Decel Time: 148 msec    TR Vmax:        305.00 cm/s MV E velocity: 39.80 cm/s MV A velocity: 60.80 cm/s  SHUNTS MV E/A ratio:  0.65        Systemic VTI:  0.15 m                            Systemic Diam: 2.20 cm Bartholome Bill MD Electronically signed by Bartholome Bill MD Signature Date/Time: 09/19/2020/10:05:21 AM    Final      ASSESSMENT: 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  PLAN:    1.  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  Biopsy confirmed adenocarcinoma likely of colon origin.  Colonoscopy on Aug 13, 2020 revealed transverse colon mass that was biopsied and although morphologically different, consistent with her metastatic disease. CEA is also elevated 323.  PET scan results from August 25, 2020 reviewed independently and reported as above confirming CT results and stable disease.  Patient has had port placement.  Patient completed cycle 2 of FOLFOX plus Avastin, but then had a delayed reaction possibly secondary to oxaliplatin.  Patient fully recovered, but will discontinue oxaliplatin him and switch to irinotecan.  Proceed with cycle 3 of treatment which is FOLFIRI plus Avastin today.  Return to clinic in 2 days for pump removal,  1 week for further evaluation, and then in 2 weeks for further evaluation and continuation of treatment with cycle 4.   2.  Abdominal pain: Patient does not complain of this today.  Continue Percocet as needed. 3.  Renal insufficiency: Creatinine slightly improved to 1.25 today. 4.  Hypokalemia: Mild, monitor. 5.  Nausea and vomiting: Resolved.  Continue antiemetics as prescribed. 6.  Poor appetite: Appreciate dietary input. 7.  Leukopenia: Resolved. 8.  PE/DVT: Patient noted to have new onset symptoms in her right lower extremity which revealed a large DVT while taking Eliquis.  Patient states he has not missed any doses.  She is currently taking subcutaneous Lovenox.  Plan is to remain on Lovenox for 1 month and then transition patient to [facility].  Return to clinic as above.  Patient expressed understanding and was in agreement with this plan. She also understands that She can call clinic at any time with any questions, concerns, or complaints.   Cancer Staging Adenocarcinoma of transverse colon [facility]) Staging form: Colon and Rectum - Neuroendocine Tumors, AJCC 8th Edition - Clinical stage from 08/18/2020: Stage IV (cTX, cNX, pM1b) - Signed by Lloyd Huger, MD on 08/18/2020 Stage prefix: Initial diagnosis   Lloyd Huger, MD   10/08/2020 7:29 AM

## 2020-10-05 NOTE — Patient Instructions (Signed)
[facility]  Discharge Instructions: Thank you for choosing [facility] to provide your oncology and hematology care.  If you have a lab appointment with the [facility], please go directly to the [facility] and check in at the registration area.  Wear comfortable clothing and clothing appropriate for easy access to any Portacath or PICC line.   We strive to give you quality time with your provider. You may need to reschedule your appointment if you arrive late (15 or more minutes).  Arriving late affects you and other patients whose appointments are after yours.  Also, if you miss three or more appointments without notifying the office, you may be dismissed from the clinic at the provider's discretion.      For prescription refill requests, have your pharmacy contact our office and allow 72 hours for refills to be completed.    Today you received the following chemotherapy and/or immunotherapy agents: Avastin / Folfiri    To help prevent nausea and vomiting after your treatment, we encourage you to take your nausea medication as directed.  BELOW ARE SYMPTOMS THAT SHOULD BE REPORTED IMMEDIATELY: *FEVER GREATER THAN 100.4 F (38 C) OR HIGHER *CHILLS OR SWEATING *NAUSEA AND VOMITING THAT IS NOT CONTROLLED WITH YOUR NAUSEA MEDICATION *UNUSUAL SHORTNESS OF BREATH *UNUSUAL BRUISING OR BLEEDING *URINARY PROBLEMS (pain or burning when urinating, or frequent urination) *BOWEL PROBLEMS (unusual diarrhea, constipation, pain near the anus) TENDERNESS IN MOUTH AND THROAT WITH OR WITHOUT PRESENCE OF ULCERS (sore throat, sores in mouth, or a toothache) UNUSUAL RASH, SWELLING OR PAIN  UNUSUAL VAGINAL DISCHARGE OR ITCHING   Items with * indicate a potential emergency and should be followed up as soon as possible or go to the Emergency Department if any problems should occur.  Please show the CHEMOTHERAPY ALERT CARD or IMMUNOTHERAPY ALERT CARD at  check-in to the Emergency Department and triage nurse.  Should you have questions after your visit or need to cancel or reschedule your appointment, please contact [facility]  [phone] and follow the prompts.  Office hours are 8:00 a.m. to 4:30 p.m. Monday - Friday. Please note that voicemails left after 4:00 p.m. may not be returned until the following business day.  We are closed weekends and major holidays. You have access to a nurse at all times for urgent questions. Please call the main number to the clinic [phone] and follow the prompts.  For any non-urgent questions, you may also contact your provider using MyChart. We now offer e-Visits for anyone 73 and older to request care online for non-urgent symptoms. For details visit [email or web address].   Also download the MyChart app! Go to the app store, search "MyChart", open the app, select [facility], and log in with your MyChart username and password.  Due to Covid, a mask is required upon entering the hospital/clinic. If you do not have a mask, one will be given to you upon arrival. For doctor visits, patients may have 1 support person aged 24 or older with them. For treatment visits, patients cannot have anyone with them due to current Covid guidelines and our immunocompromised population.

## 2020-10-07 ENCOUNTER — Inpatient Hospital Stay: Payer: Medicare Other

## 2020-10-07 DIAGNOSIS — C184 Malignant neoplasm of transverse colon: Secondary | ICD-10-CM

## 2020-10-07 DIAGNOSIS — Z5111 Encounter for antineoplastic chemotherapy: Secondary | ICD-10-CM | POA: Diagnosis not present

## 2020-10-07 MED ORDER — HEPARIN SOD (PORK) LOCK FLUSH 100 UNIT/ML IV SOLN
500.0000 [IU] | Freq: Once | INTRAVENOUS | Status: AC | PRN
  Administered 2020-10-07: 500 [IU]
  Filled 2020-10-07: qty 5

## 2020-10-07 MED ORDER — SODIUM CHLORIDE 0.9% FLUSH
10.0000 mL | INTRAVENOUS | Status: DC | PRN
  Administered 2020-10-07: 10 mL
  Filled 2020-10-07: qty 10

## 2020-10-08 ENCOUNTER — Encounter: Payer: Self-pay | Admitting: Nurse Practitioner

## 2020-10-08 ENCOUNTER — Encounter: Payer: Self-pay | Admitting: Oncology

## 2020-10-11 NOTE — Progress Notes (Signed)
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  INTERVAL HISTORY: Patient returns to clinic today for further evaluation and assess her toleration of FOLFIRI plus Avastin.  She tolerated treatment well without significant side effects.  She continues to have chronic weakness and fatigue, but states this is mildly improved.  She otherwise feels well and is asymptomatic.  She has no neurologic complaints.  She denies any recent fevers or illnesses.  She denies any chest pain, shortness of breath, cough, hemoptysis.  She denies any nausea, vomiting, constipation, or diarrhea.  She has no melena or hematochezia.  She has no urinary complaints.  Patient offers no further specific complaints today.  REVIEW OF SYSTEMS:   Review of Systems  Constitutional:  Positive for malaise/fatigue. Negative for fever and weight loss.  Respiratory: Negative.  Negative for cough, hemoptysis and shortness of breath.   Cardiovascular: Negative.  Negative for chest pain and leg swelling.  Gastrointestinal: Negative.  Negative for abdominal pain, blood in stool, constipation, diarrhea, melena, nausea and vomiting.  Genitourinary: Negative.  Negative for dysuria.  Musculoskeletal: Negative.  Negative for back pain.  Skin: Negative.  Negative for rash.  Neurological:  Positive for weakness. Negative for dizziness, focal weakness and headaches.  Psychiatric/Behavioral: Negative.  The patient is not nervous/anxious.    As per HPI. Otherwise, a complete review of systems is negative.  PAST MEDICAL HISTORY: Past Medical History:   Diagnosis Date   Arthritis    Chicken pox    Helicobacter pylori gastritis    Hyperlipidemia    Hypertension    Metastatic colon cancer in female [facility])    Obesity    Sleep apnea    Thyroid disease     PAST SURGICAL HISTORY: Past Surgical History:  Procedure Laterality Date   ABDOMINAL HYSTERECTOMY  12/2009   total   COLONOSCOPY WITH PROPOFOL N/A 08/13/2020   Procedure: COLONOSCOPY WITH PROPOFOL;  Surgeon: Jonathon Bellows, MD;  Location: [facility] ENDOSCOPY;  Service: Gastroenterology;  Laterality: N/A;   ESOPHAGOGASTRODUODENOSCOPY (EGD) WITH PROPOFOL N/A 08/13/2020   Procedure: ESOPHAGOGASTRODUODENOSCOPY (EGD) WITH PROPOFOL;  Surgeon: Jonathon Bellows, MD;  Location: [facility] ENDOSCOPY;  Service: Gastroenterology;  Laterality: N/A;   IR IMAGING GUIDED PORT INSERTION  08/27/2020   supracervical abdominal hysterectomy and bilateral salpingo--oophorectomy 01-17-2010 for fibroids  01/17/2010   TOTAL THYROIDECTOMY  1991-92   TUBAL LIGATION      FAMILY HISTORY: Family History  Problem Relation Age of Onset   Stroke Father    Diabetes Maternal Grandmother    Hypertension Maternal Grandmother    Diabetes Maternal Uncle    Cancer Maternal Aunt        Breast    ADVANCED DIRECTIVES (Y/N):  N  HEALTH MAINTENANCE: Social History   Tobacco Use   Smoking status: Never   Smokeless tobacco: Never  Vaping Use   Vaping Use: Never used  Substance Use Topics   Alcohol use: No    Alcohol/week: 0.0 standard drinks   Drug use: No     Colonoscopy:  PAP:  Bone density:  Lipid panel:  Allergies  Allergen Reactions   Erythromycin Itching    Current Outpatient Medications  Medication Sig Dispense  Refill   atorvastatin (LIPITOR) 10 MG tablet TAKE 1 TABLET(10 MG) BY MOUTH DAILY 90 tablet 0   clobetasol ointment (TEMOVATE) 0.05 % Apply to affected area every night for 4 weeks, then every other day for 4 weeks and then twice a week for 4 weeks or until resolution. 30 g 5   enoxaparin (LOVENOX) 100  MG/ML injection Inject 1 mL (100 mg total) into the skin every 12 (twelve) hours. 20 mL 0   feeding supplement (ENSURE ENLIVE / ENSURE PLUS) LIQD Take 237 mLs by mouth 3 (three) times daily between meals. 237 mL 12   levothyroxine (SYNTHROID) 150 MCG tablet Take 1 tablet (150 mcg total) by mouth daily. 90 tablet 3   pantoprazole (PROTONIX) 40 MG tablet Take 1 tablet (40 mg total) by mouth 2 (two) times daily. 60 tablet 0   potassium chloride (MICRO-K) 10 MEQ CR capsule Take 1 capsule (10 mEq total) by mouth daily. 30 capsule 0   Multiple Vitamin (MULTIVITAMIN WITH MINERALS) TABS tablet Take 1 tablet by mouth daily. (Patient not taking: No sig reported) 30 tablet 0   oxyCODONE-acetaminophen (PERCOCET/ROXICET) 5-325 MG tablet Take 1 tablet by mouth every 4 (four) hours as needed for severe pain. (Patient not taking: No sig reported) 30 tablet 0   prochlorperazine (COMPAZINE) 10 MG tablet Take 10 mg by mouth 2 (two) times daily as needed for nausea or vomiting.     No current facility-administered medications for this visit.    OBJECTIVE: Vitals:   10/13/20 1048  BP: (!) 147/82  Pulse: 83  Resp: 20  Temp: 97.9 F (36.6 C)  SpO2: 99%     Body mass index is 35.67 kg/m.    ECOG FS:0 - Asymptomatic  General: Well-developed, well-nourished, no acute distress. Eyes: Pink conjunctiva, anicteric sclera. HEENT: Normocephalic, moist mucous membranes. Lungs: No audible wheezing or coughing. Heart: Regular rate and rhythm. Abdomen: Soft, nontender, no obvious distention. Musculoskeletal: No edema, cyanosis, or clubbing. Neuro: Alert, answering all questions appropriately. Cranial nerves grossly intact. Skin: No rashes or petechiae noted. Psych: Normal affect.   LAB RESULTS:  Lab Results  Component Value Date   NA 138 10/13/2020   K 3.7 10/13/2020   CL 103 10/13/2020   CO2 28 10/13/2020   GLUCOSE 107 (H) 10/13/2020   BUN 9 10/13/2020   CREATININE 1.06 (H) 10/13/2020   CALCIUM 7.8 (L)  10/13/2020   PROT 6.9 10/13/2020   ALBUMIN 2.9 (L) 10/13/2020   AST 30 10/13/2020   ALT 16 10/13/2020   ALKPHOS 55 10/13/2020   BILITOT 0.3 10/13/2020   GFRNONAA 58 (L) 10/13/2020   GFRAA  01/10/2010    >60        The eGFR has been calculated using the MDRD equation. This calculation has not been validated in all clinical situations. eGFR's persistently <60 mL/min signify possible Chronic Kidney Disease.    Lab Results  Component Value Date   WBC 3.5 (L) 10/13/2020   NEUTROABS 1.3 (L) 10/13/2020   HGB 10.6 (L) 10/13/2020   HCT 34.0 (L) 10/13/2020   MCV 79.6 (L) 10/13/2020   PLT 295 10/13/2020     STUDIES: DG Chest 2 View  Result Date: 09/18/2020 CLINICAL DATA:  Shortness of breath. EXAM: CHEST - 2 VIEW COMPARISON:  CT AP 08/02/2020 FINDINGS: There is a right chest wall port catheter with tip in the SVC. Normal heart size. Streaky opacities within the left base may represent atelectasis and or airspace disease. Pulmonary vascular congestion. Right lung  appears clear. The visualized osseous structures are unremarkable. IMPRESSION: 1. Left base atelectasis and/or airspace disease. 2. Pulmonary vascular congestion. Electronically Signed   By: Kerby Moors M.D.   On: 09/18/2020 18:32   CT Angio Chest PE W and/or Wo Contrast  Result Date: 09/18/2020 CLINICAL DATA:  PE suspected, high prob Shortness of breath for 2 weeks. Colon cancer with last chemotherapy last week. Weakness. EXAM: CT ANGIOGRAPHY CHEST WITH CONTRAST TECHNIQUE: Multidetector CT imaging of the chest was performed using the standard protocol during bolus administration of intravenous contrast. Multiplanar CT image reconstructions and MIPs were obtained to evaluate the vascular anatomy. CONTRAST:  74mL OMNIPAQUE IOHEXOL 350 MG/ML SOLN COMPARISON:  Radiograph earlier today.  PET CT 08/25/2020 FINDINGS: Cardiovascular: Examination is positive for pulmonary embolus. There is a thin saddle embolus straddling the main right  and left pulmonary arteries, with clot in the distal main extending into all lobar and many segmental and subsegmental branches. Thromboembolic burden is moderate to large. There is dilatation of the main pulmonary artery at 3.4 cm. Multi chamber cardiomegaly. There is right heart strain with RV to LV ratio of 1.89. Trace pericardial fluid. Right chest port is in place, the tip is obscured by dense IV contrast in the SVC. Mediastinum/Nodes: No enlarged mediastinal or hilar lymph nodes. No esophageal wall thickening. Thyroidectomy without soft tissue density in the thyroid bed. Lungs/Pleura: Moderate size left pleural effusion is similar to recent PET. Associated compressive atelectasis. There is no pleural nodularity or enhancement. There also peripheral opacities in the left lower lobe that may represent pulmonary infarct given occlusive emboli in this region. 7 mm lingular nodule was also seen on prior PET, series 6, image 47. Upper Abdomen: Previous ascites adjacent to the left lobe of the liver has resolved in the interim. Slight capsular nodularity of the liver. No definite acute upper abdominal findings. Musculoskeletal: There are no acute or suspicious osseous abnormalities. Mild degenerative disc disease at T9-T10. No focal bone lesion. Subareolar lobulated left breast mass is only partially included in the field of view, similar to recent PET were was not hypermetabolic. Review of the MIP images confirms the above findings. IMPRESSION: 1. Examination is positive for pulmonary embolus with moderate to large clot burden including thin saddle embolus straddling the main right and left pulmonary arteries. There is thrombus in the distal main pulmonary arteries extending into all lobar and many segmental and subsegmental branches. Positive for right heart strain with RV to LV ratio of 1.89. 2. Moderate left pleural effusion with compressive atelectasis, similar to recent PET. Peripheral opacities in the left  lower lobe may represent pulmonary infarct given occlusive emboli in this region. 3. Pulmonary nodule in the lingula measuring 7 mm, also seen on prior PET. Recommend attention at follow-up. 4. Subareolar left breast mass is only partially included in the field of view, similar to recent PET were it was not hypermetabolic. Critical Value/emergent results were called by telephone at the time of interpretation on 09/18/2020 at 10:02 pm to provider [facility] , who verbally acknowledged these results. Electronically Signed   By: Keith Rake M.D.   On: 09/18/2020 22:03   US Venous Img Lower Bilateral (DVT)  Result Date: 09/19/2020 CLINICAL DATA:  ACUTE PULMONARY EMBOLISM. EXAM: BILATERAL LOWER EXTREMITY VENOUS DOPPLER ULTRASOUND TECHNIQUE: Gray-scale sonography with graded compression, as well as color Doppler and duplex ultrasound were performed to evaluate the lower extremity deep venous systems from the level of the common femoral vein and including the common  femoral, femoral, profunda femoral, popliteal and calf veins including the posterior tibial, peroneal and gastrocnemius veins when visible. The superficial great saphenous vein was also interrogated. Spectral Doppler was utilized to evaluate flow at rest and with distal augmentation maneuvers in the common femoral, femoral and popliteal veins. COMPARISON:  December 28, 2008. FINDINGS: RIGHT LOWER EXTREMITY Common Femoral Vein: No evidence of thrombus. Normal compressibility, respiratory phasicity and response to augmentation. Saphenofemoral Junction: No evidence of thrombus. Normal compressibility and flow on color Doppler imaging. Profunda Femoral Vein: No evidence of thrombus. Normal compressibility and flow on color Doppler imaging. Femoral Vein: No evidence of thrombus. Normal compressibility, respiratory phasicity and response to augmentation. Popliteal Vein: No evidence of thrombus. Normal compressibility, respiratory phasicity and response to  augmentation. Calf Veins: No evidence of thrombus. Normal compressibility and flow on color Doppler imaging. Venous Reflux:  None. Other Findings:  None. LEFT LOWER EXTREMITY Common Femoral Vein: Nonocclusive thrombus is noted. Saphenofemoral Junction: No evidence of thrombus. Normal compressibility and flow on color Doppler imaging. Profunda Femoral Vein: Nonocclusive thrombus is noted. Femoral Vein: Nonocclusive thrombus is noted. Popliteal Vein: Nonocclusive thrombus is noted. Calf Veins: No evidence of thrombus. Normal compressibility and flow on color Doppler imaging. Superficial Great Saphenous Vein: No evidence of thrombus. Normal compressibility. Venous Reflux:  None. Other Findings:  None. IMPRESSION: Nonocclusive deep venous thrombosis is noted in the left common femoral, profunda femoral, superficial femoral and popliteal veins. These results will be called to the ordering clinician or representative by the Radiologist Assistant, and communication documented in the PACS or zVision Dashboard. Electronically Signed   By: Marijo Conception M.D.   On: 09/19/2020 09:42   US Venous Img Lower Unilateral Right  Result Date: 09/28/2020 CLINICAL DATA:  Right foot pain and swelling EXAM: RIGHT LOWER EXTREMITY VENOUS DOPPLER ULTRASOUND TECHNIQUE: Gray-scale sonography with graded compression, as well as color Doppler and duplex ultrasound were performed to evaluate the lower extremity deep venous systems from the level of the common femoral vein and including the common femoral, femoral, profunda femoral, popliteal and calf veins including the posterior tibial, peroneal and gastrocnemius veins when visible. The superficial great saphenous vein was also interrogated. Spectral Doppler was utilized to evaluate flow at rest and with distal augmentation maneuvers in the common femoral, femoral and popliteal veins. COMPARISON:  Recent prior bilateral duplex venous ultrasound 09/19/2020 FINDINGS: Contralateral Common  Femoral Vein: Nonocclusive DVT visualized within the contralateral left common femoral vein. The vessel is only partially compressible. Common Femoral Vein: No evidence of thrombus. Normal compressibility, respiratory phasicity and response to augmentation. Saphenofemoral Junction: No evidence of thrombus. Normal compressibility and flow on color Doppler imaging. Profunda Femoral Vein: No evidence of thrombus. Normal compressibility and flow on color Doppler imaging. Femoral Vein: No evidence of thrombus. Normal compressibility, respiratory phasicity and response to augmentation. Popliteal Vein: No evidence of thrombus. Normal compressibility, respiratory phasicity and response to augmentation. Calf Veins: Eccentric wall thickening is present in the peroneal veins. There is positive flow on color Doppler imaging. Findings are consistent with nonocclusive versus subacute DVT. Superficial Great Saphenous Vein: No evidence of thrombus. Normal compressibility. Venous Reflux:  None. Other Findings:  None. IMPRESSION: 1. Positive for nonocclusive DVT within the peroneal veins of the right calf. 2. Persistent nonocclusive DVT in the contralateral left common femoral vein as seen on recent prior duplex ultrasound imaging. Electronically Signed   By: Jacqulynn Cadet M.D.   On: 09/28/2020 12:37   DG Chest [facility] 1 View  Result  Date: 09/22/2020 CLINICAL DATA:  Shortness of breath, hypertension, metastatic colon cancer EXAM: PORTABLE CHEST 1 VIEW COMPARISON:  Portable exam 0816 hours compared to 09/18/2020 FINDINGS: RIGHT jugular Port-A-Cath with tip projecting over SVC at the level of the azygos confluence unchanged. Slightly rotated to the RIGHT. Normal heart size, mediastinal contours, and pulmonary vascularity. Persistent subsegmental atelectasis at LEFT base. Remaining lungs clear. No pulmonary infiltrate, pleural effusion, or pneumothorax. Osseous structures unremarkable. IMPRESSION: Persistent subsegmental atelectasis  LEFT lung base. Electronically Signed   By: Lavonia Dana M.D.   On: 09/22/2020 10:53   ECHOCARDIOGRAM COMPLETE  Result Date: 09/19/2020    ECHOCARDIOGRAM REPORT   Patient Name:   Judith Ross Date of Exam: 09/19/2020 Medical Rec #:  [id number]       Height:       66.0 in Accession #:    [id number]      Weight:       227.7 lb Date of Birth:  1955/09/23      BSA:          2.113 m Patient Age:    65 years        BP:           131/78 mmHg Patient Gender: F               HR:           88 bpm. Exam Location:  [facility] Procedure: 2D Echo Indications:     Pulmonary Embolus  History:         Patient has no prior history of Echocardiogram examinations.                  Cancer; Risk Factors:Hypertension, Dyslipidemia and Obesity.  Sonographer:     Wallace Keller Thornton-Maynard Referring Phys:  [id number] BRITTON L RUST-CHESTER Diagnosing Phys: Bartholome Bill MD IMPRESSIONS  1. Left ventricular ejection fraction, by estimation, is 70 to 75%. The left ventricle has hyperdynamic function. The left ventricle has no regional wall motion abnormalities. There is moderate left ventricular hypertrophy. Left ventricular diastolic parameters are consistent with Grade I diastolic dysfunction (impaired relaxation).  2. Right ventricular systolic function is normal. The right ventricular size is mildly enlarged. There is moderately elevated pulmonary artery systolic pressure.  3. Right atrial size was mildly dilated.  4. The mitral valve was not well visualized. Trivial mitral valve regurgitation.  5. The aortic valve is calcified. Aortic valve regurgitation is trivial. Conclusion(s)/Recommendation(s): No intracardiac source of embolism detected on this transthoracic study. A transesophageal echocardiogram is recommended to exclude cardiac source of embolism if clinically indicated. FINDINGS  Left Ventricle: Left ventricular ejection fraction, by estimation, is 70 to 75%. The left ventricle has hyperdynamic function. The left ventricle has no  regional wall motion abnormalities. The left ventricular internal cavity size was normal in size. There is moderate left ventricular hypertrophy. Left ventricular diastolic parameters are consistent with Grade I diastolic dysfunction (impaired relaxation). Right Ventricle: The right ventricular size is mildly enlarged. No increase in right ventricular wall thickness. Right ventricular systolic function is normal. There is moderately elevated pulmonary artery systolic pressure. The tricuspid regurgitant velocity is 3.05 m/s, and with an assumed right atrial pressure of 10 mmHg, the estimated right ventricular systolic pressure is 37.0 mmHg. Left Atrium: Left atrial size was normal in size. Right Atrium: Right atrial size was mildly dilated. Pericardium: There is no evidence of pericardial effusion. Mitral Valve: The mitral valve was not well visualized. Trivial mitral valve regurgitation. Tricuspid Valve:  The tricuspid valve is not well visualized. Tricuspid valve regurgitation is mild. Aortic Valve: The aortic valve is calcified. Aortic valve regurgitation is trivial. Aortic valve mean gradient measures 5.0 mmHg. Aortic valve peak gradient measures 6.6 mmHg. Aortic valve area, by VTI measures 3.22 cm. Pulmonic Valve: The pulmonic valve was not well visualized. Pulmonic valve regurgitation is trivial. Aorta: The aortic root is normal in size and structure. IAS/Shunts: The atrial septum is grossly normal.  LEFT VENTRICLE PLAX 2D LVIDd:         3.18 cm  Diastology LVIDs:         1.67 cm  LV e' medial:    5.33 cm/s LV PW:         1.57 cm  LV E/e' medial:  7.5 LV IVS:        1.74 cm  LV e' lateral:   10.40 cm/s LVOT diam:     2.20 cm  LV E/e' lateral: 3.8 LV SV:         57 LV SV Index:   27 LVOT Area:     3.80 cm  RIGHT VENTRICLE RV S prime:     9.46 cm/s TAPSE (M-mode): 1.2 cm LEFT ATRIUM             Index      RIGHT ATRIUM           Index LA diam:        3.50 cm 1.66 cm/m RA Area:     16.30 cm LA Vol (A2C):   17.8  ml 8.42 ml/m RA Volume:   44.50 ml  21.06 ml/m LA Vol (A4C):   20.0 ml 9.46 ml/m LA Biplane Vol: 19.1 ml 9.04 ml/m  AORTIC VALVE AV Area (Vmax):    3.06 cm AV Area (Vmean):   2.51 cm AV Area (VTI):     3.22 cm AV Vmax:           128.00 cm/s AV Vmean:          106.000 cm/s AV VTI:            0.177 m AV Peak Grad:      6.6 mmHg AV Mean Grad:      5.0 mmHg LVOT Vmax:         103.00 cm/s LVOT Vmean:        70.000 cm/s LVOT VTI:          0.150 m LVOT/AV VTI ratio: 0.85  AORTA Ao Root diam: 3.40 cm MITRAL VALVE               TRICUSPID VALVE MV Area (PHT): 5.13 cm    TR Peak grad:   37.2 mmHg MV Decel Time: 148 msec    TR Vmax:        305.00 cm/s MV E velocity: 39.80 cm/s MV A velocity: 60.80 cm/s  SHUNTS MV E/A ratio:  0.65        Systemic VTI:  0.15 m                            Systemic Diam: 2.20 cm Bartholome Bill MD Electronically signed by Bartholome Bill MD Signature Date/Time: 09/19/2020/10:05:21 AM    Final      ASSESSMENT: 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  PLAN:    1.  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  Biopsy confirmed adenocarcinoma likely of colon origin.  Colonoscopy on Aug 13, 2020 revealed transverse colon mass that was biopsied and although morphologically different, consistent with her metastatic disease. CEA is also elevated 323.  PET scan results from August 25, 2020 reviewed independently  confirming CT results and stage of disease.  Patient has had port placement.  Patient completed cycle 2 of FOLFOX plus Avastin, but then had a delayed reaction possibly secondary to oxaliplatin.  Patient fully recovered, but will discontinue oxaliplatin him and switch to irinotecan.  She tolerated her first infusion of FOLFIRI plus Avastin without significant side effects.  Return to clinic in 1 week for further  evaluation and consideration of cycle 4.   2.  Abdominal pain: Patient does not complain of this today.  Continue Percocet as needed. 3.  Renal insufficiency: Essentially resolved.  Patient's creatinine is 1.06. 4.  Hypokalemia: Resolved. 5.  Nausea and vomiting: Resolved.  Continue antiemetics as prescribed. 6.  Poor appetite: Improved.  Appreciate dietary input. 7.  Leukopenia: Mild, monitor. 8.  PE/DVT: Patient noted to have new onset symptoms in her right lower extremity which revealed a large DVT while taking Eliquis.  Patient states he has not missed any doses.  She is currently taking subcutaneous Lovenox.  Plan is to remain on Lovenox for 1 month and then transition patient to [place].  Patient has approximately 2 weeks left of Lovenox treatment.  Return to clinic as above.  Patient expressed understanding and was in agreement with this plan. She also understands that She can call clinic at any time with any questions, concerns, or complaints.   Cancer Staging Adenocarcinoma of transverse colon [facility]) Staging form: Colon and Rectum - Neuroendocine Tumors, AJCC 8th Edition - Clinical stage from 08/18/2020: Stage IV (cTX, cNX, pM1b) - Signed by Lloyd Huger, MD on 08/18/2020 Stage prefix: Initial diagnosis   Lloyd Huger, MD   10/14/2020 7:12 AM

## 2020-10-12 ENCOUNTER — Other Ambulatory Visit: Payer: Medicare Other

## 2020-10-12 ENCOUNTER — Ambulatory Visit: Payer: Medicare Other | Admitting: Oncology

## 2020-10-12 ENCOUNTER — Ambulatory Visit: Payer: Medicare Other

## 2020-10-13 ENCOUNTER — Inpatient Hospital Stay: Payer: Medicare Other

## 2020-10-13 ENCOUNTER — Other Ambulatory Visit: Payer: Self-pay

## 2020-10-13 ENCOUNTER — Inpatient Hospital Stay (HOSPITAL_BASED_OUTPATIENT_CLINIC_OR_DEPARTMENT_OTHER): Payer: Medicare Other | Admitting: Oncology

## 2020-10-13 VITALS — BP 147/82 | HR 83 | Temp 97.9°F | Resp 20 | Wt 221.0 lb

## 2020-10-13 DIAGNOSIS — C184 Malignant neoplasm of transverse colon: Secondary | ICD-10-CM

## 2020-10-13 DIAGNOSIS — Z5111 Encounter for antineoplastic chemotherapy: Secondary | ICD-10-CM | POA: Diagnosis not present

## 2020-10-13 LAB — CBC WITH DIFFERENTIAL/PLATELET
Abs Immature Granulocytes: 0.01 10*3/uL (ref 0.00–0.07)
Basophils Absolute: 0 10*3/uL (ref 0.0–0.1)
Basophils Relative: 1 %
Eosinophils Absolute: 0.1 10*3/uL (ref 0.0–0.5)
Eosinophils Relative: 2 %
HCT: 34 % — ABNORMAL LOW (ref 36.0–46.0)
Hemoglobin: 10.6 g/dL — ABNORMAL LOW (ref 12.0–15.0)
Immature Granulocytes: 0 %
Lymphocytes Relative: 55 %
Lymphs Abs: 2 10*3/uL (ref 0.7–4.0)
MCH: 24.8 pg — ABNORMAL LOW (ref 26.0–34.0)
MCHC: 31.2 g/dL (ref 30.0–36.0)
MCV: 79.6 fL — ABNORMAL LOW (ref 80.0–100.0)
Monocytes Absolute: 0.2 10*3/uL (ref 0.1–1.0)
Monocytes Relative: 5 %
Neutro Abs: 1.3 10*3/uL — ABNORMAL LOW (ref 1.7–7.7)
Neutrophils Relative %: 37 %
Platelets: 295 10*3/uL (ref 150–400)
RBC: 4.27 MIL/uL (ref 3.87–5.11)
RDW: 20.7 % — ABNORMAL HIGH (ref 11.5–15.5)
WBC: 3.5 10*3/uL — ABNORMAL LOW (ref 4.0–10.5)
nRBC: 0 % (ref 0.0–0.2)

## 2020-10-13 LAB — COMPREHENSIVE METABOLIC PANEL
ALT: 16 U/L (ref 0–44)
AST: 30 U/L (ref 15–41)
Albumin: 2.9 g/dL — ABNORMAL LOW (ref 3.5–5.0)
Alkaline Phosphatase: 55 U/L (ref 38–126)
Anion gap: 7 (ref 5–15)
BUN: 9 mg/dL (ref 8–23)
CO2: 28 mmol/L (ref 22–32)
Calcium: 7.8 mg/dL — ABNORMAL LOW (ref 8.9–10.3)
Chloride: 103 mmol/L (ref 98–111)
Creatinine, Ser: 1.06 mg/dL — ABNORMAL HIGH (ref 0.44–1.00)
GFR, Estimated: 58 mL/min — ABNORMAL LOW (ref >60)
Glucose, Bld: 107 mg/dL — ABNORMAL HIGH (ref 70–99)
Potassium: 3.7 mmol/L (ref 3.5–5.1)
Sodium: 138 mmol/L (ref 135–145)
Total Bilirubin: 0.3 mg/dL (ref 0.3–1.2)
Total Protein: 6.9 g/dL (ref 6.5–8.1)

## 2020-10-14 ENCOUNTER — Encounter: Payer: Self-pay | Admitting: Nurse Practitioner

## 2020-10-14 ENCOUNTER — Encounter: Payer: Self-pay | Admitting: Oncology

## 2020-10-15 ENCOUNTER — Encounter: Payer: Self-pay | Admitting: Nurse Practitioner

## 2020-10-15 ENCOUNTER — Other Ambulatory Visit (HOSPITAL_COMMUNITY): Payer: Self-pay

## 2020-10-15 ENCOUNTER — Other Ambulatory Visit: Payer: Self-pay | Admitting: Oncology

## 2020-10-15 ENCOUNTER — Encounter: Payer: Self-pay | Admitting: Oncology

## 2020-10-15 MED ORDER — ENOXAPARIN SODIUM 100 MG/ML IJ SOSY: 100.0000 mg | PREFILLED_SYRINGE | Freq: Two times a day (BID) | INTRAMUSCULAR | 0 refills | Status: DC

## 2020-10-17 NOTE — Progress Notes (Signed)
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  INTERVAL HISTORY: Patient returns to clinic today for further evaluation and consideration of cycle 4 which is FOLFIRI plus Avastin.  She continues to have mild, chronic weakness and fatigue, but otherwise feels well. She has no neurologic complaints.  She denies any recent fevers or illnesses.  She denies any chest pain, shortness of breath, cough, hemoptysis.  She denies any nausea, vomiting, constipation, or diarrhea.  She has no melena or hematochezia.  She has no urinary complaints.  She has no further leg pain.  Patient offers no further specific complaints today.    REVIEW OF SYSTEMS:   Review of Systems  Constitutional:  Positive for malaise/fatigue. Negative for fever and weight loss.  Respiratory: Negative.  Negative for cough, hemoptysis and shortness of breath.   Cardiovascular: Negative.  Negative for chest pain and leg swelling.  Gastrointestinal: Negative.  Negative for abdominal pain, blood in stool, constipation, diarrhea, melena, nausea and vomiting.  Genitourinary: Negative.  Negative for dysuria.  Musculoskeletal: Negative.  Negative for back pain.  Skin: Negative.  Negative for rash.  Neurological:  Positive for weakness. Negative for dizziness, focal weakness and headaches.  Psychiatric/Behavioral: Negative.  The patient is not nervous/anxious.    As per HPI. Otherwise, a complete review of systems is negative.  PAST MEDICAL HISTORY: Past Medical History:  Diagnosis Date   Arthritis    Chicken pox    Helicobacter pylori gastritis     Hyperlipidemia    Hypertension    Metastatic colon cancer in female [facility])    Obesity    Sleep apnea    Thyroid disease     PAST SURGICAL HISTORY: Past Surgical History:  Procedure Laterality Date   ABDOMINAL HYSTERECTOMY  12/2009   total   COLONOSCOPY WITH PROPOFOL N/A 08/13/2020   Procedure: COLONOSCOPY WITH PROPOFOL;  Surgeon: Jonathon Bellows, MD;  Location: [facility] ENDOSCOPY;  Service: Gastroenterology;  Laterality: N/A;   ESOPHAGOGASTRODUODENOSCOPY (EGD) WITH PROPOFOL N/A 08/13/2020   Procedure: ESOPHAGOGASTRODUODENOSCOPY (EGD) WITH PROPOFOL;  Surgeon: Jonathon Bellows, MD;  Location: [facility] ENDOSCOPY;  Service: Gastroenterology;  Laterality: N/A;   IR IMAGING GUIDED PORT INSERTION  08/27/2020   supracervical abdominal hysterectomy and bilateral salpingo--oophorectomy 01-17-2010 for fibroids  01/17/2010   TOTAL THYROIDECTOMY  1991-92   TUBAL LIGATION      FAMILY HISTORY: Family History  Problem Relation Age of Onset   Stroke Father    Diabetes Maternal Grandmother    Hypertension Maternal Grandmother    Diabetes Maternal Uncle    Cancer Maternal Aunt        Breast    ADVANCED DIRECTIVES (Y/N):  N  HEALTH MAINTENANCE: Social History   Tobacco Use   Smoking status: Never   Smokeless tobacco: Never  Vaping Use   Vaping Use: Never used  Substance Use Topics   Alcohol use: No    Alcohol/week: 0.0 standard drinks   Drug use: No     Colonoscopy:  PAP:  Bone density:  Lipid panel:  Allergies  Allergen Reactions   Erythromycin Itching    Current Outpatient Medications  Medication Sig Dispense Refill   atorvastatin (LIPITOR) 10 MG tablet  TAKE 1 TABLET(10 MG) BY MOUTH DAILY 90 tablet 0   clobetasol ointment (TEMOVATE) 0.05 % Apply to affected area every night for 4 weeks, then every other day for 4 weeks and then twice a week for 4 weeks or until resolution. 30 g 5   enoxaparin (LOVENOX) 100 MG/ML injection Inject 1 mL (100 mg total) into the skin every 12 (twelve) hours.  20 mL 0   feeding supplement (ENSURE ENLIVE / ENSURE PLUS) LIQD Take 237 mLs by mouth 3 (three) times daily between meals. 237 mL 12   levothyroxine (SYNTHROID) 150 MCG tablet Take 1 tablet (150 mcg total) by mouth daily. 90 tablet 3   Multiple Vitamin (MULTIVITAMIN WITH MINERALS) TABS tablet Take 1 tablet by mouth daily. 30 tablet 0   pantoprazole (PROTONIX) 40 MG tablet Take 1 tablet (40 mg total) by mouth 2 (two) times daily. 60 tablet 0   potassium chloride (MICRO-K) 10 MEQ CR capsule Take 1 capsule (10 mEq total) by mouth daily. 30 capsule 0   rivaroxaban (XARELTO) 20 MG TABS tablet Take 1 tablet (20 mg total) by mouth daily with supper. Start on final full day of lovenox injections. 30 tablet 5   oxyCODONE-acetaminophen (PERCOCET/ROXICET) 5-325 MG tablet Take 1 tablet by mouth every 4 (four) hours as needed for severe pain. (Patient not taking: No sig reported) 30 tablet 0   prochlorperazine (COMPAZINE) 10 MG tablet Take 10 mg by mouth 2 (two) times daily as needed for nausea or vomiting. (Patient not taking: Reported on 10/20/2020)     No current facility-administered medications for this visit.   Facility-Administered Medications Ordered in Other Visits  Medication Dose Route Frequency Provider Last Rate Last Admin   fluorouracil (ADRUCIL) 5,300 mg in sodium chloride 0.9 % 144 mL chemo infusion  2,400 mg/m2 (Treatment Plan Recorded) Intravenous 1 day or 1 dose Grayland Ormond, Kathlene November, MD       fluorouracil (ADRUCIL) chemo injection 900 mg  400 mg/m2 (Treatment Plan Recorded) Intravenous Once Lloyd Huger, MD       heparin lock flush 100 unit/mL  500 Units Intravenous Once Lloyd Huger, MD       irinotecan (CAMPTOSAR) 400 mg in sodium chloride 0.9 % 500 mL chemo infusion  180 mg/m2 (Treatment Plan Recorded) Intravenous Once Lloyd Huger, MD 347 mL/hr at 10/20/20 1126 400 mg at 10/20/20 1126   leucovorin 900 mg in sodium chloride 0.9 % 250 mL infusion  409 mg/m2 (Treatment Plan  Recorded) Intravenous Once Lloyd Huger, MD 197 mL/hr at 10/20/20 1125 900 mg at 10/20/20 1125    OBJECTIVE: Vitals:   10/20/20 0925  BP: 126/79  Pulse: 88  Resp: 18  Temp: 98 F (36.7 C)  SpO2: 100%     Body mass index is 36.14 kg/m.    ECOG FS:0 - Asymptomatic  General: Well-developed, well-nourished, no acute distress. Eyes: Pink conjunctiva, anicteric sclera. HEENT: Normocephalic, moist mucous membranes. Lungs: No audible wheezing or coughing. Heart: Regular rate and rhythm. Abdomen: Soft, nontender, no obvious distention. Musculoskeletal: No edema, cyanosis, or clubbing. Neuro: Alert, answering all questions appropriately. Cranial nerves grossly intact. Skin: No rashes or petechiae noted. Psych: Normal affect.   LAB RESULTS:  Lab Results  Component Value Date   NA 138 10/20/2020   K 3.0 (L) 10/20/2020   CL 101 10/20/2020   CO2 27 10/20/2020   GLUCOSE 130 (H) 10/20/2020   BUN 7 (L) 10/20/2020   CREATININE 0.92 10/20/2020   CALCIUM 7.8 (  L) 10/20/2020   PROT 6.8 10/20/2020   ALBUMIN 2.9 (L) 10/20/2020   AST 30 10/20/2020   ALT 16 10/20/2020   ALKPHOS 62 10/20/2020   BILITOT <0.1 (L) 10/20/2020   GFRNONAA >60 10/20/2020   GFRAA  01/10/2010    >60        The eGFR has been calculated using the MDRD equation. This calculation has not been validated in all clinical situations. eGFR's persistently <60 mL/min signify possible Chronic Kidney Disease.    Lab Results  Component Value Date   WBC 3.7 (L) 10/20/2020   NEUTROABS 1.4 (L) 10/20/2020   HGB 10.1 (L) 10/20/2020   HCT 32.9 (L) 10/20/2020   MCV 81.8 10/20/2020   PLT 385 10/20/2020     STUDIES: US Venous Img Lower Unilateral Right  Result Date: 09/28/2020 CLINICAL DATA:  Right foot pain and swelling EXAM: RIGHT LOWER EXTREMITY VENOUS DOPPLER ULTRASOUND TECHNIQUE: Gray-scale sonography with graded compression, as well as color Doppler and duplex ultrasound were performed to evaluate the lower  extremity deep venous systems from the level of the common femoral vein and including the common femoral, femoral, profunda femoral, popliteal and calf veins including the posterior tibial, peroneal and gastrocnemius veins when visible. The superficial great saphenous vein was also interrogated. Spectral Doppler was utilized to evaluate flow at rest and with distal augmentation maneuvers in the common femoral, femoral and popliteal veins. COMPARISON:  Recent prior bilateral duplex venous ultrasound 09/19/2020 FINDINGS: Contralateral Common Femoral Vein: Nonocclusive DVT visualized within the contralateral left common femoral vein. The vessel is only partially compressible. Common Femoral Vein: No evidence of thrombus. Normal compressibility, respiratory phasicity and response to augmentation. Saphenofemoral Junction: No evidence of thrombus. Normal compressibility and flow on color Doppler imaging. Profunda Femoral Vein: No evidence of thrombus. Normal compressibility and flow on color Doppler imaging. Femoral Vein: No evidence of thrombus. Normal compressibility, respiratory phasicity and response to augmentation. Popliteal Vein: No evidence of thrombus. Normal compressibility, respiratory phasicity and response to augmentation. Calf Veins: Eccentric wall thickening is present in the peroneal veins. There is positive flow on color Doppler imaging. Findings are consistent with nonocclusive versus subacute DVT. Superficial Great Saphenous Vein: No evidence of thrombus. Normal compressibility. Venous Reflux:  None. Other Findings:  None. IMPRESSION: 1. Positive for nonocclusive DVT within the peroneal veins of the right calf. 2. Persistent nonocclusive DVT in the contralateral left common femoral vein as seen on recent prior duplex ultrasound imaging. Electronically Signed   By: Jacqulynn Cadet M.D.   On: 09/28/2020 12:37   DG Chest Port 1 View  Result Date: 09/22/2020 CLINICAL DATA:  Shortness of breath,  hypertension, metastatic colon cancer EXAM: PORTABLE CHEST 1 VIEW COMPARISON:  Portable exam 0816 hours compared to 09/18/2020 FINDINGS: RIGHT jugular Port-A-Cath with tip projecting over SVC at the level of the azygos confluence unchanged. Slightly rotated to the RIGHT. Normal heart size, mediastinal contours, and pulmonary vascularity. Persistent subsegmental atelectasis at LEFT base. Remaining lungs clear. No pulmonary infiltrate, pleural effusion, or pneumothorax. Osseous structures unremarkable. IMPRESSION: Persistent subsegmental atelectasis LEFT lung base. Electronically Signed   By: Lavonia Dana M.D.   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  PLAN:    1.  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  Biopsy confirmed adenocarcinoma likely of colon origin.  Colonoscopy on Aug 13, 2020 revealed transverse colon mass that was biopsied and although morphologically different, consistent with her metastatic disease.  Pretreatment CEA was elevated at 313.  PET scan results from August 25, 2020 reviewed independently  confirming CT results and stage of disease.  Patient has had port placement.  Patient completed cycle 2 of FOLFOX plus Avastin, but then had a delayed reaction possibly secondary to oxaliplatin.  Oxaliplatin was discontinued and patient will proceed with cycle 4 of FOLFIRI plus Avastin today.  Return to clinic in 2 days for pump removal and then in 2 weeks for further evaluation and consideration of cycle 5.  Will reimage after cycle 6. 2.  Abdominal pain: Patient does not complain of this today.  Continue Percocet as needed. 3.  Renal insufficiency: Resolved. 4.  Hypokalemia: Potassium decreased to 3.0.  Continue oral potassium supplementation. 5.  Nausea and vomiting: Resolved.  Continue  antiemetics as prescribed. 6.  Poor appetite: Improved.  Appreciate dietary input. 7.  Leukopenia: Chronic and unchanged.  Monitor. 8.  PE/DVT: Patient noted to have new onset symptoms in her right lower extremity which revealed a large DVT while taking Eliquis.  She is currently taking subcutaneous Lovenox.  Plan is to remain on Lovenox for 1 month and then transition patient to [place].  Patient has approximately 8 to 10 days left of Lovenox treatment and then was given transition instructions to take Xarelto 20 mg daily.  Patient expressed understanding and was in agreement with this plan. She also understands that She can call clinic at any time with any questions, concerns, or complaints.   Cancer Staging Adenocarcinoma of transverse colon [facility]) Staging form: Colon and Rectum - Neuroendocine Tumors, AJCC 8th Edition - Clinical stage from 08/18/2020: Stage IV (cTX, cNX, pM1b) - Signed by Lloyd Huger, MD on 08/18/2020 Stage prefix: Initial diagnosis   Lloyd Huger, MD   10/20/2020 11:45 AM

## 2020-10-19 ENCOUNTER — Telehealth: Payer: Self-pay

## 2020-10-19 ENCOUNTER — Telehealth: Payer: Self-pay | Admitting: Oncology

## 2020-10-19 ENCOUNTER — Encounter: Payer: Self-pay | Admitting: Oncology

## 2020-10-19 MED ORDER — ENOXAPARIN SODIUM 100 MG/ML IJ SOSY: 100.0000 mg | PREFILLED_SYRINGE | Freq: Two times a day (BID) | INTRAMUSCULAR | 0 refills | Status: DC

## 2020-10-19 NOTE — Telephone Encounter (Signed)
Contacted patient, left voicemail for return call regarding financial assistance with medication (Lovenox)

## 2020-10-19 NOTE — Progress Notes (Signed)
Telephone conversation with patient based upon referral from Dr. Grayland Ormond  Discussed one-time $1000 [place] and qualifications to assist with personal expenses while undergoing treatment.  Patient has a copy of the approval letter and information on expenses Approval Date 10/20/2020

## 2020-10-19 NOTE — Telephone Encounter (Signed)
Patient has been approved to receive her Lovenox with Jeannetta Ellis at [facility].  New rx will be sent to [facility] with notation to cover with [facility].  Patient was notified of pharmacy change.

## 2020-10-20 ENCOUNTER — Other Ambulatory Visit: Payer: Self-pay

## 2020-10-20 ENCOUNTER — Encounter: Payer: Self-pay | Admitting: Oncology

## 2020-10-20 ENCOUNTER — Inpatient Hospital Stay (HOSPITAL_BASED_OUTPATIENT_CLINIC_OR_DEPARTMENT_OTHER): Payer: Medicare Other | Admitting: Oncology

## 2020-10-20 ENCOUNTER — Inpatient Hospital Stay: Payer: Medicare Other | Attending: Oncology

## 2020-10-20 ENCOUNTER — Inpatient Hospital Stay: Payer: Medicare Other

## 2020-10-20 VITALS — BP 126/79 | HR 88 | Temp 98.0°F | Resp 18 | Wt 223.9 lb

## 2020-10-20 DIAGNOSIS — Z5111 Encounter for antineoplastic chemotherapy: Secondary | ICD-10-CM | POA: Diagnosis not present

## 2020-10-20 DIAGNOSIS — D649 Anemia, unspecified: Secondary | ICD-10-CM | POA: Diagnosis not present

## 2020-10-20 DIAGNOSIS — Z79899 Other long term (current) drug therapy: Secondary | ICD-10-CM | POA: Diagnosis not present

## 2020-10-20 DIAGNOSIS — C786 Secondary malignant neoplasm of retroperitoneum and peritoneum: Secondary | ICD-10-CM | POA: Diagnosis not present

## 2020-10-20 DIAGNOSIS — Z86718 Personal history of other venous thrombosis and embolism: Secondary | ICD-10-CM | POA: Insufficient documentation

## 2020-10-20 DIAGNOSIS — C184 Malignant neoplasm of transverse colon: Secondary | ICD-10-CM

## 2020-10-20 DIAGNOSIS — Z7901 Long term (current) use of anticoagulants: Secondary | ICD-10-CM | POA: Insufficient documentation

## 2020-10-20 DIAGNOSIS — E876 Hypokalemia: Secondary | ICD-10-CM | POA: Insufficient documentation

## 2020-10-20 LAB — CBC WITH DIFFERENTIAL/PLATELET
Abs Immature Granulocytes: 0 10*3/uL (ref 0.00–0.07)
Basophils Absolute: 0 10*3/uL (ref 0.0–0.1)
Basophils Relative: 0 %
Eosinophils Absolute: 0.3 10*3/uL (ref 0.0–0.5)
Eosinophils Relative: 8 %
HCT: 32.9 % — ABNORMAL LOW (ref 36.0–46.0)
Hemoglobin: 10.1 g/dL — ABNORMAL LOW (ref 12.0–15.0)
Immature Granulocytes: 0 %
Lymphocytes Relative: 45 %
Lymphs Abs: 1.7 10*3/uL (ref 0.7–4.0)
MCH: 25.1 pg — ABNORMAL LOW (ref 26.0–34.0)
MCHC: 30.7 g/dL (ref 30.0–36.0)
MCV: 81.8 fL (ref 80.0–100.0)
Monocytes Absolute: 0.3 10*3/uL (ref 0.1–1.0)
Monocytes Relative: 9 %
Neutro Abs: 1.4 10*3/uL — ABNORMAL LOW (ref 1.7–7.7)
Neutrophils Relative %: 38 %
Platelets: 385 10*3/uL (ref 150–400)
RBC: 4.02 MIL/uL (ref 3.87–5.11)
RDW: 24 % — ABNORMAL HIGH (ref 11.5–15.5)
WBC: 3.7 10*3/uL — ABNORMAL LOW (ref 4.0–10.5)
nRBC: 0 % (ref 0.0–0.2)

## 2020-10-20 LAB — COMPREHENSIVE METABOLIC PANEL
ALT: 16 U/L (ref 0–44)
AST: 30 U/L (ref 15–41)
Albumin: 2.9 g/dL — ABNORMAL LOW (ref 3.5–5.0)
Alkaline Phosphatase: 62 U/L (ref 38–126)
Anion gap: 10 (ref 5–15)
BUN: 7 mg/dL — ABNORMAL LOW (ref 8–23)
CO2: 27 mmol/L (ref 22–32)
Calcium: 7.8 mg/dL — ABNORMAL LOW (ref 8.9–10.3)
Chloride: 101 mmol/L (ref 98–111)
Creatinine, Ser: 0.92 mg/dL (ref 0.44–1.00)
GFR, Estimated: >60 mL/min (ref >60)
Glucose, Bld: 130 mg/dL — ABNORMAL HIGH (ref 70–99)
Potassium: 3 mmol/L — ABNORMAL LOW (ref 3.5–5.1)
Sodium: 138 mmol/L (ref 135–145)
Total Bilirubin: <0.1 mg/dL — ABNORMAL LOW (ref 0.3–1.2)
Total Protein: 6.8 g/dL (ref 6.5–8.1)

## 2020-10-20 LAB — URINALYSIS, DIPSTICK ONLY
Bilirubin Urine: NEGATIVE
Glucose, UA: NEGATIVE mg/dL
Hgb urine dipstick: NEGATIVE
Ketones, ur: NEGATIVE mg/dL
Nitrite: NEGATIVE
Protein, ur: 30 mg/dL — AB
Specific Gravity, Urine: 1.021 (ref 1.005–1.030)
pH: 5 (ref 5.0–8.0)

## 2020-10-20 MED ORDER — SODIUM CHLORIDE 0.9 % IV SOLN
5.0000 mg/kg | Freq: Once | INTRAVENOUS | Status: AC
  Administered 2020-10-20: 500 mg via INTRAVENOUS
  Filled 2020-10-20: qty 16

## 2020-10-20 MED ORDER — SODIUM CHLORIDE 0.9 % IV SOLN
409.0000 mg/m2 | Freq: Once | INTRAVENOUS | Status: AC
  Administered 2020-10-20: 900 mg via INTRAVENOUS
  Filled 2020-10-20: qty 45

## 2020-10-20 MED ORDER — FLUOROURACIL CHEMO INJECTION 2.5 GM/50ML
400.0000 mg/m2 | Freq: Once | INTRAVENOUS | Status: AC
  Administered 2020-10-20: 900 mg via INTRAVENOUS
  Filled 2020-10-20: qty 18

## 2020-10-20 MED ORDER — SODIUM CHLORIDE 0.9 % IV SOLN
180.0000 mg/m2 | Freq: Once | INTRAVENOUS | Status: AC
  Administered 2020-10-20: 400 mg via INTRAVENOUS
  Filled 2020-10-20: qty 15

## 2020-10-20 MED ORDER — ATROPINE SULFATE 1 MG/ML IJ SOLN
0.5000 mg | Freq: Once | INTRAMUSCULAR | Status: AC | PRN
  Administered 2020-10-20: 0.5 mg via INTRAVENOUS
  Filled 2020-10-20: qty 1

## 2020-10-20 MED ORDER — PALONOSETRON HCL INJECTION 0.25 MG/5ML
0.2500 mg | Freq: Once | INTRAVENOUS | Status: AC
  Administered 2020-10-20: 0.25 mg via INTRAVENOUS
  Filled 2020-10-20: qty 5

## 2020-10-20 MED ORDER — HEPARIN SOD (PORK) LOCK FLUSH 100 UNIT/ML IV SOLN
500.0000 [IU] | Freq: Once | INTRAVENOUS | Status: DC
  Filled 2020-10-20: qty 5

## 2020-10-20 MED ORDER — SODIUM CHLORIDE 0.9 % IV SOLN
2400.0000 mg/m2 | INTRAVENOUS | Status: DC
  Administered 2020-10-20: 5300 mg via INTRAVENOUS
  Filled 2020-10-20: qty 106

## 2020-10-20 MED ORDER — SODIUM CHLORIDE 0.9% FLUSH
10.0000 mL | Freq: Once | INTRAVENOUS | Status: AC
  Administered 2020-10-20: 10 mL via INTRAVENOUS
  Filled 2020-10-20: qty 10

## 2020-10-20 MED ORDER — SODIUM CHLORIDE 0.9 % IV SOLN
Freq: Once | INTRAVENOUS | Status: AC
  Filled 2020-10-20: qty 250

## 2020-10-20 MED ORDER — RIVAROXABAN 20 MG PO TABS: 20.0000 mg | ORAL_TABLET | Freq: Every day | ORAL | 5 refills | Status: DC

## 2020-10-20 MED ORDER — SODIUM CHLORIDE 0.9 % IV SOLN
10.0000 mg | Freq: Once | INTRAVENOUS | Status: AC
  Administered 2020-10-20: 10 mg via INTRAVENOUS
  Filled 2020-10-20: qty 10

## 2020-10-20 NOTE — Patient Instructions (Signed)
[facility] ONCOLOGY  Discharge Instructions: Thank you for choosing [facility] to provide your oncology and hematology care.  If you have a lab appointment with the [facility], please go directly to the [facility] and check in at the registration area.  Wear comfortable clothing and clothing appropriate for easy access to any Portacath or PICC line.   We strive to give you quality time with your provider. You may need to reschedule your appointment if you arrive late (15 or more minutes).  Arriving late affects you and other patients whose appointments are after yours.  Also, if you miss three or more appointments without notifying the office, you may be dismissed from the clinic at the provider's discretion.      For prescription refill requests, have your pharmacy contact our office and allow 72 hours for refills to be completed.    Today you received the following chemotherapy and/or immunotherapy agents Avastin, irintocan, leucovorin, Adrucil    To help prevent nausea and vomiting after your treatment, we encourage you to take your nausea medication as directed.  BELOW ARE SYMPTOMS THAT SHOULD BE REPORTED IMMEDIATELY: *FEVER GREATER THAN 100.4 F (38 C) OR HIGHER *CHILLS OR SWEATING *NAUSEA AND VOMITING THAT IS NOT CONTROLLED WITH YOUR NAUSEA MEDICATION *UNUSUAL SHORTNESS OF BREATH *UNUSUAL BRUISING OR BLEEDING *URINARY PROBLEMS (pain or burning when urinating, or frequent urination) *BOWEL PROBLEMS (unusual diarrhea, constipation, pain near the anus) TENDERNESS IN MOUTH AND THROAT WITH OR WITHOUT PRESENCE OF ULCERS (sore throat, sores in mouth, or a toothache) UNUSUAL RASH, SWELLING OR PAIN  UNUSUAL VAGINAL DISCHARGE OR ITCHING   Items with * indicate a potential emergency and should be followed up as soon as possible or go to the Emergency Department if any problems should occur.  Please show the CHEMOTHERAPY ALERT CARD or  IMMUNOTHERAPY ALERT CARD at check-in to the Emergency Department and triage nurse.  Should you have questions after your visit or need to cancel or reschedule your appointment, please contact [facility]  [phone] and follow the prompts.  Office hours are 8:00 a.m. to 4:30 p.m. Monday - Friday. Please note that voicemails left after 4:00 p.m. may not be returned until the following business day.  We are closed weekends and major holidays. You have access to a nurse at all times for urgent questions. Please call the main number to the clinic [phone] and follow the prompts.  For any non-urgent questions, you may also contact your provider using MyChart. We now offer e-Visits for anyone 78 and older to request care online for non-urgent symptoms. For details visit [email or web address].   Also download the MyChart app! Go to the app store, search "MyChart", open the app, select [facility], and log in with your MyChart username and password.  Due to Covid, a mask is required upon entering the hospital/clinic. If you do not have a mask, one will be given to you upon arrival. For doctor visits, patients may have 1 support person aged 89 or older with them. For treatment visits, patients cannot have anyone with them due to current Covid guidelines and our immunocompromised population.

## 2020-10-20 NOTE — Progress Notes (Signed)
Mild edema in LE's improving. Denies any pain from DVTs. Poor appetite. Eats mandarin oranges and fruit cups. Drinking Ensure 1 per day. Denies nausea. Bowels normal. Pigmentation changes in hands and on tongue from chemotherapy.

## 2020-10-21 ENCOUNTER — Encounter: Payer: Medicare Other | Admitting: Internal Medicine

## 2020-10-22 ENCOUNTER — Inpatient Hospital Stay: Payer: Medicare Other

## 2020-10-22 DIAGNOSIS — C184 Malignant neoplasm of transverse colon: Secondary | ICD-10-CM

## 2020-10-22 DIAGNOSIS — Z5111 Encounter for antineoplastic chemotherapy: Secondary | ICD-10-CM | POA: Diagnosis not present

## 2020-10-22 MED ORDER — HEPARIN SOD (PORK) LOCK FLUSH 100 UNIT/ML IV SOLN
INTRAVENOUS | Status: AC
  Filled 2020-10-22: qty 5

## 2020-10-22 MED ORDER — SODIUM CHLORIDE 0.9% FLUSH
10.0000 mL | INTRAVENOUS | Status: DC | PRN
  Administered 2020-10-22: 10 mL
  Filled 2020-10-22: qty 10

## 2020-10-22 MED ORDER — HEPARIN SOD (PORK) LOCK FLUSH 100 UNIT/ML IV SOLN
500.0000 [IU] | Freq: Once | INTRAVENOUS | Status: AC | PRN
  Administered 2020-10-22: 500 [IU]
  Filled 2020-10-22: qty 5

## 2020-10-25 ENCOUNTER — Other Ambulatory Visit: Payer: Self-pay | Admitting: Nurse Practitioner

## 2020-10-29 ENCOUNTER — Other Ambulatory Visit: Payer: Self-pay

## 2020-10-29 ENCOUNTER — Telehealth: Payer: Self-pay

## 2020-10-29 MED ORDER — RIVAROXABAN 20 MG PO TABS: 20.0000 mg | ORAL_TABLET | Freq: Every day | ORAL | 0 refills | Status: DC

## 2020-10-29 NOTE — Telephone Encounter (Signed)
Patient was instructed to start taking Xarelto 1 tablet (20 mg total) by mouth daily with supper. Start on final full day of lovenox injections at last MD visit.  The Xarelto rx was sent to [facility] but pt needs assistance with co-pay, last lovenox inj today given today.  Rockie Neighbours. Approved Xarelto prescription be sent to [facility] under the [facility].   Juliann Pulse will check if patient qualifies for patient assistance program for Xarelto.  Rx cancelled at [facility] and sent to [facility] for [facility] to cover co-pay.

## 2020-10-30 NOTE — Progress Notes (Signed)
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  INTERVAL HISTORY: Patient returns to clinic today for further evaluation and consideration of cycle 5 of FOLFIRI plus Avastin. She has chronic weakness and fatigue, but otherwise feels well. She has no neurologic complaints.  She denies any recent fevers or illnesses.  She denies any chest pain, shortness of breath, cough, hemoptysis.  She denies any nausea, vomiting, constipation, or diarrhea.  She has no melena or hematochezia.  She has no urinary complaints.  She has no further leg pain. Patient offers no further specific complaints today.  REVIEW OF SYSTEMS:   Review of Systems  Constitutional:  Positive for malaise/fatigue. Negative for fever and weight loss.  Respiratory: Negative.  Negative for cough, hemoptysis and shortness of breath.   Cardiovascular: Negative.  Negative for chest pain and leg swelling.  Gastrointestinal: Negative.  Negative for abdominal pain, blood in stool, constipation, diarrhea, melena, nausea and vomiting.  Genitourinary: Negative.  Negative for dysuria.  Musculoskeletal: Negative.  Negative for back pain.  Skin: Negative.  Negative for rash.  Neurological:  Positive for weakness. Negative for dizziness, focal weakness and headaches.  Psychiatric/Behavioral: Negative.  The patient is not nervous/anxious.    As per HPI. Otherwise, a complete review of systems is negative.  PAST MEDICAL HISTORY: Past Medical History:  Diagnosis Date   Arthritis    Chicken pox    Helicobacter pylori gastritis    Hyperlipidemia     Hypertension    Metastatic colon cancer in female [facility])    Obesity    Sleep apnea    Thyroid disease     PAST SURGICAL HISTORY: Past Surgical History:  Procedure Laterality Date   ABDOMINAL HYSTERECTOMY  12/2009   total   COLONOSCOPY WITH PROPOFOL N/A 08/13/2020   Procedure: COLONOSCOPY WITH PROPOFOL;  Surgeon: Jonathon Bellows, MD;  Location: [facility] ENDOSCOPY;  Service: Gastroenterology;  Laterality: N/A;   ESOPHAGOGASTRODUODENOSCOPY (EGD) WITH PROPOFOL N/A 08/13/2020   Procedure: ESOPHAGOGASTRODUODENOSCOPY (EGD) WITH PROPOFOL;  Surgeon: Jonathon Bellows, MD;  Location: [facility] ENDOSCOPY;  Service: Gastroenterology;  Laterality: N/A;   IR IMAGING GUIDED PORT INSERTION  08/27/2020   supracervical abdominal hysterectomy and bilateral salpingo--oophorectomy 01-17-2010 for fibroids  01/17/2010   TOTAL THYROIDECTOMY  1991-92   TUBAL LIGATION      FAMILY HISTORY: Family History  Problem Relation Age of Onset   Stroke Father    Diabetes Maternal Grandmother    Hypertension Maternal Grandmother    Diabetes Maternal Uncle    Cancer Maternal Aunt        Breast    ADVANCED DIRECTIVES (Y/N):  N  HEALTH MAINTENANCE: Social History   Tobacco Use   Smoking status: Never   Smokeless tobacco: Never  Vaping Use   Vaping Use: Never used  Substance Use Topics   Alcohol use: No    Alcohol/week: 0.0 standard drinks   Drug use: No     Colonoscopy:  PAP:  Bone density:  Lipid panel:  Allergies  Allergen Reactions   Erythromycin Itching    Current Outpatient Medications  Medication Sig Dispense Refill   atorvastatin (LIPITOR) 10 MG tablet TAKE 1 TABLET(10 MG) BY MOUTH DAILY 90  tablet 0   clobetasol ointment (TEMOVATE) 0.05 % Apply to affected area every night for 4 weeks, then every other day for 4 weeks and then twice a week for 4 weeks or until resolution. 30 g 5   feeding supplement (ENSURE ENLIVE / ENSURE PLUS) LIQD Take 237 mLs by mouth 3 (three) times daily between meals. 237 mL 12    levothyroxine (SYNTHROID) 150 MCG tablet Take 1 tablet (150 mcg total) by mouth daily. 90 tablet 3   potassium chloride (MICRO-K) 10 MEQ CR capsule TAKE 1 CAPSULE(10 MEQ) BY MOUTH DAILY 90 capsule 0   rivaroxaban (XARELTO) 20 MG TABS tablet Take 1 tablet (20 mg total) by mouth daily with supper. Start on final full day of lovenox injections. 30 tablet 0   enoxaparin (LOVENOX) 100 MG/ML injection Inject 1 mL (100 mg total) into the skin every 12 (twelve) hours. (Patient not taking: Reported on 11/02/2020) 20 mL 0   oxyCODONE-acetaminophen (PERCOCET/ROXICET) 5-325 MG tablet Take 1 tablet by mouth every 4 (four) hours as needed for severe pain. (Patient not taking: No sig reported) 30 tablet 0   pantoprazole (PROTONIX) 40 MG tablet Take 1 tablet (40 mg total) by mouth 2 (two) times daily. 60 tablet 0   prochlorperazine (COMPAZINE) 10 MG tablet Take 10 mg by mouth 2 (two) times daily as needed for nausea or vomiting. (Patient not taking: No sig reported)     No current facility-administered medications for this visit.   Facility-Administered Medications Ordered in Other Visits  Medication Dose Route Frequency Provider Last Rate Last Admin   fluorouracil (ADRUCIL) 5,300 mg in sodium chloride 0.9 % 144 mL chemo infusion  2,400 mg/m2 (Treatment Plan Recorded) Intravenous 1 day or 1 dose Jeralyn Ruths, MD   5,300 mg at 11/02/20 1408    OBJECTIVE: Vitals:   11/02/20 0946  BP: 104/85  Pulse: 86  Resp: 18  Temp: (!) 97.5 F (36.4 C)  SpO2: 100%     Body mass index is 34.86 kg/m.    ECOG FS:0 - Asymptomatic  General: Well-developed, well-nourished, no acute distress. Eyes: Pink conjunctiva, anicteric sclera. HEENT: Normocephalic, moist mucous membranes. Lungs: No audible wheezing or coughing. Heart: Regular rate and rhythm. Abdomen: Soft, nontender, no obvious distention. Musculoskeletal: No edema, cyanosis, or clubbing. Neuro: Alert, answering all questions appropriately. Cranial nerves  grossly intact. Skin: No rashes or petechiae noted. Psych: Normal affect.   LAB RESULTS:  Lab Results  Component Value Date   NA 140 11/02/2020   K 3.0 (L) 11/02/2020   CL 99 11/02/2020   CO2 29 11/02/2020   GLUCOSE 99 11/02/2020   BUN 7 (L) 11/02/2020   CREATININE 0.90 11/02/2020   CALCIUM 8.3 (L) 11/02/2020   PROT 7.1 11/02/2020   ALBUMIN 3.1 (L) 11/02/2020   AST 26 11/02/2020   ALT 14 11/02/2020   ALKPHOS 66 11/02/2020   BILITOT 0.7 11/02/2020   GFRNONAA >60 11/02/2020   GFRAA  01/10/2010    >60        The eGFR has been calculated using the MDRD equation. This calculation has not been validated in all clinical situations. eGFR's persistently <60 mL/min signify possible Chronic Kidney Disease.    Lab Results  Component Value Date   WBC 3.7 (L) 11/02/2020   NEUTROABS 0.8 (L) 11/02/2020   HGB 10.6 (L) 11/02/2020   HCT 33.4 (L) 11/02/2020   MCV 83.3 11/02/2020   PLT 440 (H) 11/02/2020     STUDIES: No results found.  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  PLAN:    1.  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  Biopsy confirmed adenocarcinoma likely of colon origin.  Colonoscopy on Aug 13, 2020 revealed transverse colon mass that was biopsied and although morphologically different, consistent with her metastatic disease.  Pretreatment CEA was elevated at 313.  PET scan results from August 25, 2020 reviewed independently  confirming CT results and stage of disease.  Patient has had port placement.  Patient completed cycle 2 of FOLFOX plus Avastin, but then had a delayed reaction possibly secondary to oxaliplatin.  Oxaliplatin has been discontinued. Proceed with cycle 5 of FOLFIRI plus Avastin today despite mild neutropenia.  Return to clinic in 2 days for pump removal and then in 2 weeks  for further evaluation and consideration of cycle 6.  Will reimage after cycle 6. 2.  Abdominal pain: Resolved  Continue Percocet as needed. 3.  Renal insufficiency: Resolved. 4.  Hypokalemia: Chronic and unchanged. Continue oral potassium supplementation. 5.  Nausea and vomiting: Resolved.  Continue antiemetics as prescribed. 6.  Poor appetite: Improved.  Appreciate dietary input. 7.  Neutropenia: Proceed with treatment as above and will consider adding in Neulasta or bio-similar with subsequent treatments. 8.  PE/DVT: Patient noted to have new onset symptoms in her right lower extremity which revealed a large DVT while taking Eliquis. 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  9. Anemia: Mild, monitor.  Patient expressed understanding and was in agreement with this plan. She also understands that She can call clinic at any time with any questions, concerns, or complaints.   Cancer Staging Adenocarcinoma of transverse colon [facility]) Staging form: Colon and Rectum - Neuroendocine Tumors, AJCC 8th Edition - Clinical stage from 08/18/2020: Stage IV (cTX, cNX, pM1b) - Signed by Lloyd Huger, MD on 08/18/2020 Stage prefix: Initial diagnosis   Lloyd Huger, MD   11/02/2020 7:14 PM

## 2020-11-02 ENCOUNTER — Other Ambulatory Visit: Payer: Self-pay

## 2020-11-02 ENCOUNTER — Inpatient Hospital Stay: Payer: Medicare Other

## 2020-11-02 ENCOUNTER — Inpatient Hospital Stay (HOSPITAL_BASED_OUTPATIENT_CLINIC_OR_DEPARTMENT_OTHER): Payer: Medicare Other | Admitting: Oncology

## 2020-11-02 VITALS — BP 104/85 | HR 86 | Temp 97.5°F | Resp 18 | Wt 216.0 lb

## 2020-11-02 DIAGNOSIS — Z95828 Presence of other vascular implants and grafts: Secondary | ICD-10-CM

## 2020-11-02 DIAGNOSIS — C184 Malignant neoplasm of transverse colon: Secondary | ICD-10-CM | POA: Diagnosis not present

## 2020-11-02 DIAGNOSIS — Z5111 Encounter for antineoplastic chemotherapy: Secondary | ICD-10-CM | POA: Diagnosis not present

## 2020-11-02 LAB — COMPREHENSIVE METABOLIC PANEL WITH GFR
ALT: 14 U/L (ref 0–44)
AST: 26 U/L (ref 15–41)
Albumin: 3.1 g/dL — ABNORMAL LOW (ref 3.5–5.0)
Alkaline Phosphatase: 66 U/L (ref 38–126)
Anion gap: 12 (ref 5–15)
BUN: 7 mg/dL — ABNORMAL LOW (ref 8–23)
CO2: 29 mmol/L (ref 22–32)
Calcium: 8.3 mg/dL — ABNORMAL LOW (ref 8.9–10.3)
Chloride: 99 mmol/L (ref 98–111)
Creatinine, Ser: 0.9 mg/dL (ref 0.44–1.00)
GFR, Estimated: >60 mL/min
Glucose, Bld: 99 mg/dL (ref 70–99)
Potassium: 3 mmol/L — ABNORMAL LOW (ref 3.5–5.1)
Sodium: 140 mmol/L (ref 135–145)
Total Bilirubin: 0.7 mg/dL (ref 0.3–1.2)
Total Protein: 7.1 g/dL (ref 6.5–8.1)

## 2020-11-02 LAB — CBC WITH DIFFERENTIAL/PLATELET
Abs Immature Granulocytes: 0 K/uL (ref 0.00–0.07)
Basophils Absolute: 0 K/uL (ref 0.0–0.1)
Basophils Relative: 1 %
Eosinophils Absolute: 0.1 K/uL (ref 0.0–0.5)
Eosinophils Relative: 2 %
HCT: 33.4 % — ABNORMAL LOW (ref 36.0–46.0)
Hemoglobin: 10.6 g/dL — ABNORMAL LOW (ref 12.0–15.0)
Immature Granulocytes: 0 %
Lymphocytes Relative: 61 %
Lymphs Abs: 2.3 K/uL (ref 0.7–4.0)
MCH: 26.4 pg (ref 26.0–34.0)
MCHC: 31.7 g/dL (ref 30.0–36.0)
MCV: 83.3 fL (ref 80.0–100.0)
Monocytes Absolute: 0.5 K/uL (ref 0.1–1.0)
Monocytes Relative: 13 %
Neutro Abs: 0.8 K/uL — ABNORMAL LOW (ref 1.7–7.7)
Neutrophils Relative %: 23 %
Platelets: 440 K/uL — ABNORMAL HIGH (ref 150–400)
RBC: 4.01 MIL/uL (ref 3.87–5.11)
RDW: 25.7 % — ABNORMAL HIGH (ref 11.5–15.5)
WBC: 3.7 K/uL — ABNORMAL LOW (ref 4.0–10.5)
nRBC: 0 % (ref 0.0–0.2)

## 2020-11-02 LAB — URINALYSIS, DIPSTICK ONLY
Bilirubin Urine: NEGATIVE
Glucose, UA: NEGATIVE mg/dL
Hgb urine dipstick: NEGATIVE
Ketones, ur: NEGATIVE mg/dL
Nitrite: NEGATIVE
Protein, ur: 30 mg/dL — AB
Specific Gravity, Urine: 1.024 (ref 1.005–1.030)
pH: 5 (ref 5.0–8.0)

## 2020-11-02 MED ORDER — SODIUM CHLORIDE 0.9 % IV SOLN
900.0000 mg | Freq: Once | INTRAVENOUS | Status: AC
  Administered 2020-11-02: 900 mg via INTRAVENOUS
  Filled 2020-11-02: qty 45

## 2020-11-02 MED ORDER — SODIUM CHLORIDE 0.9 % IV SOLN
180.0000 mg/m2 | Freq: Once | INTRAVENOUS | Status: AC
  Administered 2020-11-02: 400 mg via INTRAVENOUS
  Filled 2020-11-02: qty 15

## 2020-11-02 MED ORDER — ATROPINE SULFATE 1 MG/ML IJ SOLN
0.5000 mg | Freq: Once | INTRAMUSCULAR | Status: AC | PRN
  Administered 2020-11-02: 0.5 mg via INTRAVENOUS
  Filled 2020-11-02: qty 1

## 2020-11-02 MED ORDER — SODIUM CHLORIDE 0.9 % IV SOLN
10.0000 mg | Freq: Once | INTRAVENOUS | Status: AC
  Administered 2020-11-02: 10 mg via INTRAVENOUS
  Filled 2020-11-02: qty 10

## 2020-11-02 MED ORDER — SODIUM CHLORIDE 0.9% FLUSH
10.0000 mL | Freq: Once | INTRAVENOUS | Status: AC
Start: 2020-11-02 — End: 2020-11-02
  Administered 2020-11-02: 10 mL via INTRAVENOUS
  Filled 2020-11-02: qty 10

## 2020-11-02 MED ORDER — SODIUM CHLORIDE 0.9 % IV SOLN
5.0000 mg/kg | Freq: Once | INTRAVENOUS | Status: AC
  Administered 2020-11-02: 500 mg via INTRAVENOUS
  Filled 2020-11-02: qty 16

## 2020-11-02 MED ORDER — HEPARIN SOD (PORK) LOCK FLUSH 100 UNIT/ML IV SOLN
INTRAVENOUS | Status: AC
  Filled 2020-11-02: qty 5

## 2020-11-02 MED ORDER — SODIUM CHLORIDE 0.9 % IV SOLN
2400.0000 mg/m2 | INTRAVENOUS | Status: DC
  Administered 2020-11-02: 5300 mg via INTRAVENOUS
  Filled 2020-11-02: qty 106

## 2020-11-02 MED ORDER — FLUOROURACIL CHEMO INJECTION 2.5 GM/50ML
400.0000 mg/m2 | Freq: Once | INTRAVENOUS | Status: AC
  Administered 2020-11-02: 900 mg via INTRAVENOUS
  Filled 2020-11-02: qty 18

## 2020-11-02 MED ORDER — SODIUM CHLORIDE 0.9 % IV SOLN
Freq: Once | INTRAVENOUS | Status: AC
  Filled 2020-11-02: qty 250

## 2020-11-02 MED ORDER — PALONOSETRON HCL INJECTION 0.25 MG/5ML
0.2500 mg | Freq: Once | INTRAVENOUS | Status: AC
  Administered 2020-11-02: 0.25 mg via INTRAVENOUS
  Filled 2020-11-02: qty 5

## 2020-11-02 NOTE — Patient Instructions (Signed)
[facility]  Discharge Instructions: Thank you for choosing [facility] to provide your oncology and hematology care.  If you have a lab appointment with the [facility], please go directly to the [facility] and check in at the registration area.  Wear comfortable clothing and clothing appropriate for easy access to any Portacath or PICC line.   We strive to give you quality time with your provider. You may need to reschedule your appointment if you arrive late (15 or more minutes).  Arriving late affects you and other patients whose appointments are after yours.  Also, if you miss three or more appointments without notifying the office, you may be dismissed from the clinic at the provider's discretion.      For prescription refill requests, have your pharmacy contact our office and allow 72 hours for refills to be completed.    Today you received the following chemotherapy and/or immunotherapy agents Zirabev, Leucovorin, 5 FU , irinotecan   To help prevent nausea and vomiting after your treatment, we encourage you to take your nausea medication as directed.  BELOW ARE SYMPTOMS THAT SHOULD BE REPORTED IMMEDIATELY: *FEVER GREATER THAN 100.4 F (38 C) OR HIGHER *CHILLS OR SWEATING *NAUSEA AND VOMITING THAT IS NOT CONTROLLED WITH YOUR NAUSEA MEDICATION *UNUSUAL SHORTNESS OF BREATH *UNUSUAL BRUISING OR BLEEDING *URINARY PROBLEMS (pain or burning when urinating, or frequent urination) *BOWEL PROBLEMS (unusual diarrhea, constipation, pain near the anus) TENDERNESS IN MOUTH AND THROAT WITH OR WITHOUT PRESENCE OF ULCERS (sore throat, sores in mouth, or a toothache) UNUSUAL RASH, SWELLING OR PAIN  UNUSUAL VAGINAL DISCHARGE OR ITCHING   Items with * indicate a potential emergency and should be followed up as soon as possible or go to the Emergency Department if any problems should occur.  Please show the CHEMOTHERAPY ALERT CARD or  IMMUNOTHERAPY ALERT CARD at check-in to the Emergency Department and triage nurse.  Should you have questions after your visit or need to cancel or reschedule your appointment, please contact [facility]  [phone] and follow the prompts.  Office hours are 8:00 a.m. to 4:30 p.m. Monday - Friday. Please note that voicemails left after 4:00 p.m. may not be returned until the following business day.  We are closed weekends and major holidays. You have access to a nurse at all times for urgent questions. Please call the main number to the clinic [phone] and follow the prompts.  For any non-urgent questions, you may also contact your provider using MyChart. We now offer e-Visits for anyone 38 and older to request care online for non-urgent symptoms. For details visit [email or web address].   Also download the MyChart app! Go to the app store, search "MyChart", open the app, select [facility], and log in with your MyChart username and password.  Due to Covid, a mask is required upon entering the hospital/clinic. If you do not have a mask, one will be given to you upon arrival. For doctor visits, patients may have 1 support person aged 56 or older with them. For treatment visits, patients cannot have anyone with them due to current Covid guidelines and our immunocompromised population.   Bevacizumab injection What is this medication? BEVACIZUMAB (be va SIZ yoo mab) is a monoclonal antibody. It is used to treatmany types of cancer. This medicine may be used for other purposes; ask your health care provider orpharmacist if you have questions. COMMON BRAND NAME(S): Avastin, MVASI, Zirabev What should I tell my  care team before I take this medication? They need to know if you have any of these conditions: diabetes heart disease high blood pressure history of coughing up blood prior anthracycline chemotherapy (e.g., doxorubicin, daunorubicin,  epirubicin) recent or ongoing radiation therapy recent or planning to have surgery stroke an unusual or allergic reaction to bevacizumab, hamster proteins, mouse proteins, other medicines, foods, dyes, or preservatives pregnant or trying to get pregnant breast-feeding How should I use this medication? This medicine is for infusion into a vein. It is given by a health careprofessional in a hospital or clinic setting. Talk to your pediatrician regarding the use of this medicine in children.Special care may be needed. Overdosage: If you think you have taken too much of this medicine contact apoison control center or emergency room at once. NOTE: This medicine is only for you. Do not share this medicine with others. What if I miss a dose? It is important not to miss your dose. Call your doctor or health careprofessional if you are unable to keep an appointment. What may interact with this medication? Interactions are not expected. This list may not describe all possible interactions. Give your health care provider a list of all the medicines, herbs, non-prescription drugs, or dietary supplements you use. Also tell them if you smoke, drink alcohol, or use illegaldrugs. Some items may interact with your medicine. What should I watch for while using this medication? Your condition will be monitored carefully while you are receiving this medicine. You will need important blood work and urine testing done while youare taking this medicine. This medicine may increase your risk to bruise or bleed. Call your doctor orhealth care professional if you notice any unusual bleeding. Before having surgery, talk to your health care provider to make sure it is ok. This drug can increase the risk of poor healing of your surgical site or wound. You will need to stop this drug for 28 days before surgery. After surgery, wait at least 28 days before restarting this drug. Make sure the surgical site or wound is healed  enough before restarting this drug. Talk to your health careprovider if questions. Do not become pregnant while taking this medicine or for 6 months after stopping it. Women should inform their doctor if they wish to become pregnant or think they might be pregnant. There is a potential for serious side effects to an unborn child. Talk to your health care professional or pharmacist for more information. Do not breast-feed an infant while taking this medicine andfor 6 months after the last dose. This medicine has caused ovarian failure in some women. This medicine may interfere with the ability to have a child. You should talk to your doctor orhealth care professional if you are concerned about your fertility. What side effects may I notice from receiving this medication? Side effects that you should report to your doctor or health care professionalas soon as possible: allergic reactions like skin rash, itching or hives, swelling of the face, lips, or tongue chest pain or chest tightness chills coughing up blood high fever seizures severe constipation signs and symptoms of bleeding such as bloody or black, tarry stools; red or dark-brown urine; spitting up blood or brown material that looks like coffee grounds; red spots on the skin; unusual bruising or bleeding from the eye, gums, or nose signs and symptoms of a blood clot such as breathing problems; chest pain; severe, sudden headache; pain, swelling, warmth in the leg signs and symptoms of a stroke like  changes in vision; confusion; trouble speaking or understanding; severe headaches; sudden numbness or weakness of the face, arm or leg; trouble walking; dizziness; loss of balance or coordination stomach pain sweating swelling of legs or ankles vomiting weight gain Side effects that usually do not require medical attention (report to yourdoctor or health care professional if they continue or are bothersome): back pain changes in  taste decreased appetite dry skin nausea tiredness This list may not describe all possible side effects. Call your doctor for medical advice about side effects. You may report side effects to FDA at1-800-FDA-1088. Where should I keep my medication? This drug is given in a hospital or clinic and will not be stored at home. NOTE: This sheet is a summary. It may not cover all possible information. If you have questions about this medicine, talk to your doctor, pharmacist, orhealth care provider.  2022 Elsevier/Gold Standard (2019-01-01 10:50:46)  Leucovorin injection What is this medication? LEUCOVORIN (loo koe VOR in) is used to prevent or treat the harmful effects of some medicines. This medicine is used to treat anemia caused by a low amount of folic acid in the body. It is also used with 5-fluorouracil (5-FU) to treatcolon cancer. This medicine may be used for other purposes; ask your health care provider orpharmacist if you have questions. What should I tell my care team before I take this medication? They need to know if you have any of these conditions: anemia from low levels of vitamin B-12 in the blood an unusual or allergic reaction to leucovorin, folic acid, other medicines, foods, dyes, or preservatives pregnant or trying to get pregnant breast-feeding How should I use this medication? This medicine is for injection into a muscle or into a vein. It is given by ahealth care professional in a hospital or clinic setting. Talk to your pediatrician regarding the use of this medicine in children.Special care may be needed. Overdosage: If you think you have taken too much of this medicine contact apoison control center or emergency room at once. NOTE: This medicine is only for you. Do not share this medicine with others. What if I miss a dose? This does not apply. What may interact with this  medication? capecitabine fluorouracil phenobarbital phenytoin primidone trimethoprim-sulfamethoxazole This list may not describe all possible interactions. Give your health care provider a list of all the medicines, herbs, non-prescription drugs, or dietary supplements you use. Also tell them if you smoke, drink alcohol, or use illegaldrugs. Some items may interact with your medicine. What should I watch for while using this medication? Your condition will be monitored carefully while you are receiving thismedicine. This medicine may increase the side effects of 5-fluorouracil, 5-FU. Tell your doctor or health care professional if you have diarrhea or mouth sores that donot get better or that get worse. What side effects may I notice from receiving this medication? Side effects that you should report to your doctor or health care professionalas soon as possible: allergic reactions like skin rash, itching or hives, swelling of the face, lips, or tongue breathing problems fever, infection mouth sores unusual bleeding or bruising unusually weak or tired Side effects that usually do not require medical attention (report to yourdoctor or health care professional if they continue or are bothersome): constipation or diarrhea loss of appetite nausea, vomiting This list may not describe all possible side effects. Call your doctor for medical advice about side effects. You may report side effects to FDA at1-800-FDA-1088. Where should I keep my medication? This  drug is given in a hospital or clinic and will not be stored at home. NOTE: This sheet is a summary. It may not cover all possible information. If you have questions about this medicine, talk to your doctor, pharmacist, orhealth care provider.  2022 Elsevier/Gold Standard (2007-09-10 16:50:29).  Irinotecan injection What is this medication? IRINOTECAN (ir in oh TEE kan ) is a chemotherapy drug. It is used to treatcolon and rectal  cancer. This medicine may be used for other purposes; ask your health care provider orpharmacist if you have questions. COMMON BRAND NAME(S): Camptosar What should I tell my care team before I take this medication? They need to know if you have any of these conditions: dehydration diarrhea infection (especially a virus infection such as chickenpox, cold sores, or herpes) liver disease low blood counts, like low white cell, platelet, or red cell counts low levels of calcium, magnesium, or potassium in the blood recent or ongoing radiation therapy an unusual or allergic reaction to irinotecan, other medicines, foods, dyes, or preservatives pregnant or trying to get pregnant breast-feeding How should I use this medication? This drug is given as an infusion into a vein. It is administered in a hospitalor clinic by a specially trained health care professional. Talk to your pediatrician regarding the use of this medicine in children.Special care may be needed. Overdosage: If you think you have taken too much of this medicine contact apoison control center or emergency room at once. NOTE: This medicine is only for you. Do not share this medicine with others. What if I miss a dose? It is important not to miss your dose. Call your doctor or health careprofessional if you are unable to keep an appointment. What may interact with this medication? Do not take this medicine with any of the following medications: cobicistat itraconazole This medicine may interact with the following medications: antiviral medicines for HIV or AIDS certain antibiotics like rifampin or rifabutin certain medicines for fungal infections like ketoconazole, posaconazole, and voriconazole certain medicines for seizures like carbamazepine, phenobarbital, phenotoin clarithromycin gemfibrozil nefazodone [facility] John's Wort This list may not describe all possible interactions. Give your health care provider a list of all the  medicines, herbs, non-prescription drugs, or dietary supplements you use. Also tell them if you smoke, drink alcohol, or use illegaldrugs. Some items may interact with your medicine. What should I watch for while using this medication? Your condition will be monitored carefully while you are receiving this medicine. You will need important blood work done while you are taking thismedicine. This drug may make you feel generally unwell. This is not uncommon, as chemotherapy can affect healthy cells as well as cancer cells. Report any side effects. Continue your course of treatment even though you feel ill unless yourdoctor tells you to stop. In some cases, you may be given additional medicines to help with side effects.Follow all directions for their use. You may get drowsy or dizzy. Do not drive, use machinery, or do anything that needs mental alertness until you know how this medicine affects you. Do not stand or sit up quickly, especially if you are an older patient. This reducesthe risk of dizzy or fainting spells. Call your health care professional for advice if you get a fever, chills, or sore throat, or other symptoms of a cold or flu. Do not treat yourself. This medicine decreases your body's ability to fight infections. Try to avoid beingaround people who are sick. Avoid taking products that contain aspirin, acetaminophen, ibuprofen, naproxen, or  ketoprofen unless instructed by your doctor. These medicines may hide afever. This medicine may increase your risk to bruise or bleed. Call your doctor orhealth care professional if you notice any unusual bleeding. Be careful brushing and flossing your teeth or using a toothpick because you may get an infection or bleed more easily. If you have any dental work done,tell your dentist you are receiving this medicine. Do not become pregnant while taking this medicine or for 6 months after stopping it. Women should inform their health care professional if they  wish to become pregnant or think they might be pregnant. Men should not father a child while taking this medicine and for 3 months after stopping it. There is potential for serious side effects to an unborn child. Talk to your health careprofessional for more information. Do not breast-feed an infant while taking this medicine or for 7 days afterstopping it. This medicine has caused ovarian failure in some women. This medicine may make it more difficult to get pregnant. Talk to your health care professional if [place] S[place] concerned about your fertility. This medicine has caused decreased sperm counts in some men. This may make it more difficult to father a child. Talk to your health care professional if [place] concerned about your fertility. What side effects may I notice from receiving this medication? Side effects that you should report to your doctor or health care professionalas soon as possible: allergic reactions like skin rash, itching or hives, swelling of the face, lips, or tongue chest pain diarrhea flushing, runny nose, sweating during infusion low blood counts - this medicine may decrease the number of white blood cells, red blood cells and platelets. You may be at increased risk for infections and bleeding. nausea, vomiting pain, swelling, warmth in the leg signs of decreased platelets or bleeding - bruising, pinpoint red spots on the skin, black, tarry stools, blood in the urine signs of infection - fever or chills, cough, sore throat, pain or difficulty passing urine signs of decreased red blood cells - unusually weak or tired, fainting spells, lightheadedness Side effects that usually do not require medical attention (report to yourdoctor or health care professional if they continue or are bothersome): constipation hair loss headache loss of appetite mouth sores stomach pain This list may not describe all possible side effects. Call your doctor for medical advice about side effects.  You may report side effects to FDA at1-800-FDA-1088. Where should I keep my medication? This drug is given in a hospital or clinic and will not be stored at home. NOTE: This sheet is a summary. It may not cover all possible information. If you have questions about this medicine, talk to your doctor, pharmacist, orhealth care provider.  2022 Elsevier/Gold Standard (2019-02-04 17:46:13)  Fluorouracil, 5-FU injection What is this medication? FLUOROURACIL, 5-FU (flure oh YOOR a sil) is a chemotherapy drug. It slows the growth of cancer cells. This medicine is used to treat many types of cancer like breast cancer, colon or rectal cancer, pancreatic cancer, and stomachcancer. This medicine may be used for other purposes; ask your health care provider orpharmacist if you have questions. COMMON BRAND NAME(S): Adrucil What should I tell my care team before I take this medication? They need to know if you have any of these conditions: blood disorders dihydropyrimidine dehydrogenase (DPD) deficiency infection (especially a virus infection such as chickenpox, cold sores, or herpes) kidney disease liver disease malnourished, poor nutrition recent or ongoing radiation therapy an unusual or allergic reaction to fluorouracil, other  chemotherapy, other medicines, foods, dyes, or preservatives pregnant or trying to get pregnant breast-feeding How should I use this medication? This drug is given as an infusion or injection into a vein. It is administeredin a hospital or clinic by a specially trained health care professional. Talk to your pediatrician regarding the use of this medicine in children.Special care may be needed. Overdosage: If you think you have taken too much of this medicine contact apoison control center or emergency room at once. NOTE: This medicine is only for you. Do not share this medicine with others. What if I miss a dose? It is important not to miss your dose. Call your doctor or  health careprofessional if you are unable to keep an appointment. What may interact with this medication? Do not take this medicine with any of the following medications: live virus vaccines This medicine may also interact with the following medications: medicines that treat or prevent blood clots like warfarin, enoxaparin, and dalteparin This list may not describe all possible interactions. Give your health care provider a list of all the medicines, herbs, non-prescription drugs, or dietary supplements you use. Also tell them if you smoke, drink alcohol, or use illegaldrugs. Some items may interact with your medicine. What should I watch for while using this medication? Visit your doctor for checks on your progress. This drug may make you feel generally unwell. This is not uncommon, as chemotherapy can affect healthy cells as well as cancer cells. Report any side effects. Continue your course oftreatment even though you feel ill unless your doctor tells you to stop. In some cases, you may be given additional medicines to help with side effects.Follow all directions for their use. Call your doctor or health care professional for advice if you get a fever, chills or sore throat, or other symptoms of a cold or flu. Do not treat yourself. This drug decreases your body's ability to fight infections. Try toavoid being around people who are sick. This medicine may increase your risk to bruise or bleed. Call your doctor orhealth care professional if you notice any unusual bleeding. Be careful brushing and flossing your teeth or using a toothpick because you may get an infection or bleed more easily. If you have any dental work done,tell your dentist you are receiving this medicine. Avoid taking products that contain aspirin, acetaminophen, ibuprofen, naproxen, or ketoprofen unless instructed by your doctor. These medicines may hide afever. Do not become pregnant while taking this medicine. Women should inform  their doctor if they wish to become pregnant or think they might be pregnant. There is a potential for serious side effects to an unborn child. Talk to your health care professional or pharmacist for more information. Do not breast-feed aninfant while taking this medicine. Men should inform their doctor if they wish to father a child. This medicinemay lower sperm counts. Do not treat diarrhea with over the counter products. Contact your doctor ifyou have diarrhea that lasts more than 2 days or if it is severe and watery. This medicine can make you more sensitive to the sun. Keep out of the sun. If you cannot avoid being in the sun, wear protective clothing and use sunscreen.Do not use sun lamps or tanning beds/booths. What side effects may I notice from receiving this medication? Side effects that you should report to your doctor or health care professionalas soon as possible: allergic reactions like skin rash, itching or hives, swelling of the face, lips, or tongue low blood counts - this  medicine may decrease the number of white blood cells, red blood cells and platelets. You may be at increased risk for infections and bleeding. signs of infection - fever or chills, cough, sore throat, pain or difficulty passing urine signs of decreased platelets or bleeding - bruising, pinpoint red spots on the skin, black, tarry stools, blood in the urine signs of decreased red blood cells - unusually weak or tired, fainting spells, lightheadedness breathing problems changes in vision chest pain mouth sores nausea and vomiting pain, swelling, redness at site where injected pain, tingling, numbness in the hands or feet redness, swelling, or sores on hands or feet stomach pain unusual bleeding Side effects that usually do not require medical attention (report to yourdoctor or health care professional if they continue or are bothersome): changes in finger or toe nails diarrhea dry or itchy skin hair  loss headache loss of appetite sensitivity of eyes to the light stomach upset unusually teary eyes This list may not describe all possible side effects. Call your doctor for medical advice about side effects. You may report side effects to FDA at1-800-FDA-1088. Where should I keep my medication? This drug is given in a hospital or clinic and will not be stored at home. NOTE: This sheet is a summary. It may not cover all possible information. If you have questions about this medicine, talk to your doctor, pharmacist, orhealth care provider.  2022 Elsevier/Gold Standard (2019-02-04 15:00:03)

## 2020-11-02 NOTE — Progress Notes (Signed)
Patient reports skin color changes on hands, some constipation, old sensitivity and tingling in feet. She also reports no appetite. She is drinking ensure and boost.

## 2020-11-02 NOTE — Progress Notes (Signed)
Labs reviewed with Dr Grayland Ormond, ok to proceed with treatment

## 2020-11-02 NOTE — Progress Notes (Signed)
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  Patient receiving folfiri and zirabev.    Met with patient during infusion.  Patient reports that her appetite is still not good. Reports biggest issue is taste.  "I want to eat but when I get it in my mouth it does not taste right."  Drinking ensure complete at chairside.  Says she is just drinking 1 a day.  Says she has had a [employer] milkshake.  Also ate some mandarin oranges yesterday.    Reports constipation. Has not started miralax.  Last bowel movement was on 8/14    Medications: reviewed  Labs: reviewed  Anthropometrics:   Weight 216 lb today  226 lb on 7/19 225 lb on 7/12 228 lb on 6/28 263 lb on 2/1   NUTRITION DIAGNOSIS: Inadequate oral intake continues   INTERVENTION:  Recommend patient increase shakes to 2-3 per day, 350 calorie Recommend start miralax Discussed other foods with similar taste to mandarin oranges which she can taste.     MONITORING, EVALUATION, GOAL: weight trends, intake   NEXT VISIT: Tuesday, August 30th during infusion  Jaskaran Dauzat B. Zenia Resides, [place], [place] Registered Dietitian [phone] (mobile)

## 2020-11-03 ENCOUNTER — Other Ambulatory Visit: Payer: Self-pay | Admitting: Family

## 2020-11-04 ENCOUNTER — Inpatient Hospital Stay: Payer: Medicare Other

## 2020-11-04 ENCOUNTER — Other Ambulatory Visit: Payer: Self-pay

## 2020-11-04 VITALS — BP 148/95 | HR 89 | Temp 98.1°F | Resp 16

## 2020-11-04 DIAGNOSIS — C184 Malignant neoplasm of transverse colon: Secondary | ICD-10-CM

## 2020-11-04 DIAGNOSIS — Z5111 Encounter for antineoplastic chemotherapy: Secondary | ICD-10-CM | POA: Diagnosis not present

## 2020-11-04 MED ORDER — HEPARIN SOD (PORK) LOCK FLUSH 100 UNIT/ML IV SOLN
500.0000 [IU] | Freq: Once | INTRAVENOUS | Status: AC | PRN
  Filled 2020-11-04: qty 5

## 2020-11-04 MED ORDER — HEPARIN SOD (PORK) LOCK FLUSH 100 UNIT/ML IV SOLN
INTRAVENOUS | Status: AC
  Administered 2020-11-04: 500 [IU]
  Filled 2020-11-04: qty 5

## 2020-11-04 MED ORDER — SODIUM CHLORIDE 0.9% FLUSH
10.0000 mL | INTRAVENOUS | Status: DC | PRN
  Administered 2020-11-04: 10 mL
  Filled 2020-11-04: qty 10

## 2020-11-11 NOTE — Progress Notes (Signed)
[place]  [phone] [phone]  ID: Katha Cabal OB: 04-09-1955  MR#: [id number]  TXM#:[id number]  Patient Care Team: Venia Carbon, MD as PCP - General (Internal Medicine) Clent Jacks, RN as Oncology Nurse Navigator Grayland Ormond, Kathlene November, MD as Consulting Physician (Hematology and Oncology)  CHIEF COMPLAINT: 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  INTERVAL HISTORY: Patient returns to clinic today for further evaluation and consideration of cycle 6 of FOLFIRI plus Avastin.  She continues to have chronic weakness and fatigue and occasional dyspnea on exertion, but otherwise feels well.  She has no neurologic complaints.  She denies any recent fevers or illnesses.  She denies any chest pain, shortness of breath, cough, hemoptysis.  She denies any nausea, vomiting, constipation, or diarrhea.  She has no melena or hematochezia.  She has no urinary complaints.  She has no further leg pain.  Patient offers no further specific complaints today.  REVIEW OF SYSTEMS:   Review of Systems  Constitutional:  Positive for malaise/fatigue. Negative for fever and weight loss.  Respiratory: Negative.  Negative for cough, hemoptysis and shortness of breath.   Cardiovascular: Negative.  Negative for chest pain and leg swelling.  Gastrointestinal: Negative.  Negative for abdominal pain, blood in stool, constipation, diarrhea, melena, nausea and vomiting.  Genitourinary: Negative.  Negative for dysuria.  Musculoskeletal: Negative.  Negative for back pain.  Skin: Negative.  Negative for rash.  Neurological:  Positive for weakness. Negative for dizziness, focal weakness and headaches.  Psychiatric/Behavioral: Negative.  The patient is not nervous/anxious.    As per HPI. Otherwise, a complete review of systems is negative.  PAST MEDICAL HISTORY: Past Medical History:  Diagnosis Date   Arthritis    Chicken pox     Helicobacter pylori gastritis    Hyperlipidemia    Hypertension    Metastatic colon cancer in female [facility])    Obesity    Sleep apnea    Thyroid disease     PAST SURGICAL HISTORY: Past Surgical History:  Procedure Laterality Date   ABDOMINAL HYSTERECTOMY  12/2009   total   COLONOSCOPY WITH PROPOFOL N/A 08/13/2020   Procedure: COLONOSCOPY WITH PROPOFOL;  Surgeon: Jonathon Bellows, MD;  Location: [facility] ENDOSCOPY;  Service: Gastroenterology;  Laterality: N/A;   ESOPHAGOGASTRODUODENOSCOPY (EGD) WITH PROPOFOL N/A 08/13/2020   Procedure: ESOPHAGOGASTRODUODENOSCOPY (EGD) WITH PROPOFOL;  Surgeon: Jonathon Bellows, MD;  Location: [facility] ENDOSCOPY;  Service: Gastroenterology;  Laterality: N/A;   IR IMAGING GUIDED PORT INSERTION  08/27/2020   supracervical abdominal hysterectomy and bilateral salpingo--oophorectomy 01-17-2010 for fibroids  01/17/2010   TOTAL THYROIDECTOMY  1991-92   TUBAL LIGATION      FAMILY HISTORY: Family History  Problem Relation Age of Onset   Stroke Father    Diabetes Maternal Grandmother    Hypertension Maternal Grandmother    Diabetes Maternal Uncle    Cancer Maternal Aunt        Breast    ADVANCED DIRECTIVES (Y/N):  N  HEALTH MAINTENANCE: Social History   Tobacco Use   Smoking status: Never   Smokeless tobacco: Never  Vaping Use   Vaping Use: Never used  Substance Use Topics   Alcohol use: No    Alcohol/week: 0.0 standard drinks   Drug use: No     Colonoscopy:  PAP:  Bone density:  Lipid panel:  Allergies  Allergen Reactions   Erythromycin Itching    Current Outpatient Medications  Medication Sig Dispense Refill   atorvastatin (LIPITOR) 10  MG tablet TAKE 1 TABLET(10 MG) BY MOUTH DAILY 90 tablet 0   clobetasol ointment (TEMOVATE) 0.05 % Apply to affected area every night for 4 weeks, then every other day for 4 weeks and then twice a week for 4 weeks or until resolution. 30 g 5   feeding supplement (ENSURE ENLIVE / ENSURE PLUS) LIQD Take 237 mLs by mouth 3  (three) times daily between meals. 237 mL 12   levothyroxine (SYNTHROID) 150 MCG tablet Take 1 tablet (150 mcg total) by mouth daily. 90 tablet 3   potassium chloride (MICRO-K) 10 MEQ CR capsule TAKE 1 CAPSULE(10 MEQ) BY MOUTH DAILY 90 capsule 0   rivaroxaban (XARELTO) 20 MG TABS tablet Take 1 tablet (20 mg total) by mouth daily with supper. Start on final full day of lovenox injections. 30 tablet 0   enoxaparin (LOVENOX) 100 MG/ML injection Inject 1 mL (100 mg total) into the skin every 12 (twelve) hours. (Patient not taking: No sig reported) 20 mL 0   oxyCODONE-acetaminophen (PERCOCET/ROXICET) 5-325 MG tablet Take 1 tablet by mouth every 4 (four) hours as needed for severe pain. (Patient not taking: No sig reported) 30 tablet 0   pantoprazole (PROTONIX) 40 MG tablet Take 1 tablet (40 mg total) by mouth 2 (two) times daily. (Patient not taking: Reported on 11/16/2020) 60 tablet 0   prochlorperazine (COMPAZINE) 10 MG tablet Take 10 mg by mouth 2 (two) times daily as needed for nausea or vomiting. (Patient not taking: No sig reported)     No current facility-administered medications for this visit.   Facility-Administered Medications Ordered in Other Visits  Medication Dose Route Frequency Provider Last Rate Last Admin   fluorouracil (ADRUCIL) 5,300 mg in sodium chloride 0.9 % 144 mL chemo infusion  2,400 mg/m2 (Treatment Plan Recorded) Intravenous 1 day or 1 dose Orlie Dakin, Tollie Pizza, MD       fluorouracil (ADRUCIL) chemo injection 900 mg  400 mg/m2 (Treatment Plan Recorded) Intravenous Once Jeralyn Ruths, MD       heparin lock flush 100 UNIT/ML injection            irinotecan (CAMPTOSAR) 400 mg in sodium chloride 0.9 % 500 mL chemo infusion  180 mg/m2 (Treatment Plan Recorded) Intravenous Once Jeralyn Ruths, MD 347 mL/hr at 11/16/20 1247 400 mg at 11/16/20 1247   leucovorin 900 mg in sodium chloride 0.9 % 250 mL infusion  409 mg/m2 (Treatment Plan Recorded) Intravenous Once Jeralyn Ruths, MD 197 mL/hr at 11/16/20 1246 900 mg at 11/16/20 1246    OBJECTIVE: Vitals:   11/16/20 0855  BP: 131/80  Pulse: 89  Resp: 18  Temp: 97.6 F (36.4 C)  SpO2: 96%     Body mass index is 33.57 kg/m.    ECOG FS:0 - Asymptomatic  General: Well-developed, well-nourished, no acute distress. Eyes: Pink conjunctiva, anicteric sclera. HEENT: Normocephalic, moist mucous membranes. Lungs: No audible wheezing or coughing. Heart: Regular rate and rhythm. Abdomen: Soft, nontender, no obvious distention. Musculoskeletal: No edema, cyanosis, or clubbing. Neuro: Alert, answering all questions appropriately. Cranial nerves grossly intact. Skin: No rashes or petechiae noted. Psych: Normal affect.   LAB RESULTS:  Lab Results  Component Value Date   NA 139 11/16/2020   K 2.7 (LL) 11/16/2020   CL 99 11/16/2020   CO2 29 11/16/2020   GLUCOSE 104 (H) 11/16/2020   BUN 6 (L) 11/16/2020   CREATININE 0.81 11/16/2020   CALCIUM 8.3 (L) 11/16/2020   PROT 7.2 11/16/2020  ALBUMIN 3.3 (L) 11/16/2020   AST 29 11/16/2020   ALT 15 11/16/2020   ALKPHOS 77 11/16/2020   BILITOT 0.6 11/16/2020   GFRNONAA >60 11/16/2020   GFRAA  01/10/2010    >60        The eGFR has been calculated using the MDRD equation. This calculation has not been validated in all clinical situations. eGFR's persistently <60 mL/min signify possible Chronic Kidney Disease.    Lab Results  Component Value Date   WBC 3.4 (L) 11/16/2020   NEUTROABS 0.7 (L) 11/16/2020   HGB 10.8 (L) 11/16/2020   HCT 33.6 (L) 11/16/2020   MCV 84.4 11/16/2020   PLT 342 11/16/2020     STUDIES: No results found.   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  PLAN:    1.  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  Biopsy confirmed adenocarcinoma likely of  colon origin.  Colonoscopy on Aug 13, 2020 revealed transverse colon mass that was biopsied and although morphologically different, consistent with her metastatic disease.  Pretreatment CEA was elevated at 313.  PET scan results from August 25, 2020 reviewed independently  confirming CT results and stage of disease.  Patient has had port placement.  Patient completed cycle 2 of FOLFOX plus Avastin, but then had a delayed reaction possibly secondary to oxaliplatin.  Oxaliplatin has been discontinued.  Proceed with cycle 6 of FOLFIRI plus Avastin today despite persistent neutropenia.  Return to clinic in 2 days for pump removal and then in 2 weeks for further evaluation and consideration of cycle 7.  Will reimage with CT scan prior to cycle 7.   2.  Abdominal pain: Resolved  Continue Percocet as needed. 3.  Renal insufficiency: Resolved. 4.  Hypokalemia: Slightly worse today.  Patient received 40 mEq of IV potassium.  She has been instructed to continue oral potassium supplementation. 5.  Nausea and vomiting: Resolved.  Continue antiemetics as prescribed. 6.  Poor appetite: Chronic and unchanged.  Patient relates this to the poor taste of food.  Appreciate dietary input. 7.  Neutropenia: Proceed with treatment as planned.  Consider adding Neulasta or bio-similar with subsequent treatments. 8.  PE/DVT: Patient noted to have new onset symptoms in her right lower extremity which revealed a large DVT while taking Eliquis. 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  9. Anemia: Chronic and unchanged.  Patient expressed understanding and was in agreement with this plan. She also understands that She can call clinic at any time with any questions, concerns, or complaints.   Cancer Staging Adenocarcinoma of transverse colon [facility]) Staging form: Colon and Rectum - Neuroendocine Tumors, AJCC 8th Edition - Clinical stage from  08/18/2020: Stage IV (cTX, cNX, pM1b) - Signed by Lloyd Huger, MD on 08/18/2020 Stage prefix: Initial diagnosis   Lloyd Huger, MD   11/16/2020 1:11 PM

## 2020-11-16 ENCOUNTER — Inpatient Hospital Stay: Payer: Medicare Other

## 2020-11-16 ENCOUNTER — Inpatient Hospital Stay (HOSPITAL_BASED_OUTPATIENT_CLINIC_OR_DEPARTMENT_OTHER): Payer: Medicare Other | Admitting: Oncology

## 2020-11-16 ENCOUNTER — Other Ambulatory Visit: Payer: Self-pay

## 2020-11-16 ENCOUNTER — Encounter: Payer: Self-pay | Admitting: Oncology

## 2020-11-16 VITALS — BP 131/80 | HR 89 | Temp 97.6°F | Resp 18 | Ht 66.0 in | Wt 208.0 lb

## 2020-11-16 DIAGNOSIS — Z5111 Encounter for antineoplastic chemotherapy: Secondary | ICD-10-CM | POA: Diagnosis not present

## 2020-11-16 DIAGNOSIS — C184 Malignant neoplasm of transverse colon: Secondary | ICD-10-CM

## 2020-11-16 DIAGNOSIS — Z95828 Presence of other vascular implants and grafts: Secondary | ICD-10-CM

## 2020-11-16 LAB — CBC WITH DIFFERENTIAL/PLATELET
Abs Immature Granulocytes: 0 10*3/uL (ref 0.00–0.07)
Basophils Absolute: 0 10*3/uL (ref 0.0–0.1)
Basophils Relative: 1 %
Eosinophils Absolute: 0.2 10*3/uL (ref 0.0–0.5)
Eosinophils Relative: 4 %
HCT: 33.6 % — ABNORMAL LOW (ref 36.0–46.0)
Hemoglobin: 10.8 g/dL — ABNORMAL LOW (ref 12.0–15.0)
Immature Granulocytes: 0 %
Lymphocytes Relative: 64 %
Lymphs Abs: 2.2 10*3/uL (ref 0.7–4.0)
MCH: 27.1 pg (ref 26.0–34.0)
MCHC: 32.1 g/dL (ref 30.0–36.0)
MCV: 84.4 fL (ref 80.0–100.0)
Monocytes Absolute: 0.3 10*3/uL (ref 0.1–1.0)
Monocytes Relative: 9 %
Neutro Abs: 0.7 10*3/uL — ABNORMAL LOW (ref 1.7–7.7)
Neutrophils Relative %: 22 %
Platelets: 342 10*3/uL (ref 150–400)
RBC: 3.98 MIL/uL (ref 3.87–5.11)
RDW: 25.6 % — ABNORMAL HIGH (ref 11.5–15.5)
WBC: 3.4 10*3/uL — ABNORMAL LOW (ref 4.0–10.5)
nRBC: 0 % (ref 0.0–0.2)

## 2020-11-16 LAB — COMPREHENSIVE METABOLIC PANEL
ALT: 15 U/L (ref 0–44)
AST: 29 U/L (ref 15–41)
Albumin: 3.3 g/dL — ABNORMAL LOW (ref 3.5–5.0)
Alkaline Phosphatase: 77 U/L (ref 38–126)
Anion gap: 11 (ref 5–15)
BUN: 6 mg/dL — ABNORMAL LOW (ref 8–23)
CO2: 29 mmol/L (ref 22–32)
Calcium: 8.3 mg/dL — ABNORMAL LOW (ref 8.9–10.3)
Chloride: 99 mmol/L (ref 98–111)
Creatinine, Ser: 0.81 mg/dL (ref 0.44–1.00)
GFR, Estimated: >60 mL/min (ref >60)
Glucose, Bld: 104 mg/dL — ABNORMAL HIGH (ref 70–99)
Potassium: 2.7 mmol/L — CL (ref 3.5–5.1)
Sodium: 139 mmol/L (ref 135–145)
Total Bilirubin: 0.6 mg/dL (ref 0.3–1.2)
Total Protein: 7.2 g/dL (ref 6.5–8.1)

## 2020-11-16 LAB — URINALYSIS, DIPSTICK ONLY
Bilirubin Urine: NEGATIVE
Glucose, UA: NEGATIVE mg/dL
Hgb urine dipstick: NEGATIVE
Ketones, ur: 5 mg/dL — AB
Nitrite: NEGATIVE
Protein, ur: 100 mg/dL — AB
Specific Gravity, Urine: 1.024 (ref 1.005–1.030)
pH: 5 (ref 5.0–8.0)

## 2020-11-16 MED ORDER — SODIUM CHLORIDE 0.9 % IV SOLN
Freq: Once | INTRAVENOUS | Status: AC
  Filled 2020-11-16: qty 250

## 2020-11-16 MED ORDER — SODIUM CHLORIDE 0.9 % IV SOLN
10.0000 mg | Freq: Once | INTRAVENOUS | Status: AC
  Administered 2020-11-16: 10 mg via INTRAVENOUS
  Filled 2020-11-16: qty 10

## 2020-11-16 MED ORDER — SODIUM CHLORIDE 0.9% FLUSH
10.0000 mL | Freq: Once | INTRAVENOUS | Status: AC
  Administered 2020-11-16: 10 mL via INTRAVENOUS
  Filled 2020-11-16: qty 10

## 2020-11-16 MED ORDER — SODIUM CHLORIDE 0.9 % IV SOLN
180.0000 mg/m2 | Freq: Once | INTRAVENOUS | Status: AC
  Administered 2020-11-16: 400 mg via INTRAVENOUS
  Filled 2020-11-16: qty 15

## 2020-11-16 MED ORDER — HEPARIN SOD (PORK) LOCK FLUSH 100 UNIT/ML IV SOLN
INTRAVENOUS | Status: AC
  Filled 2020-11-16: qty 5

## 2020-11-16 MED ORDER — SODIUM CHLORIDE 0.9 % IV SOLN
40.0000 meq | Freq: Once | INTRAVENOUS | Status: AC
  Administered 2020-11-16: 40 meq via INTRAVENOUS
  Filled 2020-11-16: qty 20

## 2020-11-16 MED ORDER — SODIUM CHLORIDE 0.9 % IV SOLN
409.0000 mg/m2 | Freq: Once | INTRAVENOUS | Status: AC
  Administered 2020-11-16: 900 mg via INTRAVENOUS
  Filled 2020-11-16: qty 35

## 2020-11-16 MED ORDER — PALONOSETRON HCL INJECTION 0.25 MG/5ML
0.2500 mg | Freq: Once | INTRAVENOUS | Status: AC
  Administered 2020-11-16: 0.25 mg via INTRAVENOUS
  Filled 2020-11-16: qty 5

## 2020-11-16 MED ORDER — SODIUM CHLORIDE 0.9 % IV SOLN
2400.0000 mg/m2 | INTRAVENOUS | Status: DC
  Administered 2020-11-16: 5300 mg via INTRAVENOUS
  Filled 2020-11-16: qty 106

## 2020-11-16 MED ORDER — ATROPINE SULFATE 1 MG/ML IJ SOLN
0.5000 mg | Freq: Once | INTRAMUSCULAR | Status: AC | PRN
  Administered 2020-11-16: 0.5 mg via INTRAVENOUS
  Filled 2020-11-16: qty 1

## 2020-11-16 MED ORDER — SODIUM CHLORIDE 0.9 % IV SOLN
5.0000 mg/kg | Freq: Once | INTRAVENOUS | Status: AC
  Administered 2020-11-16: 500 mg via INTRAVENOUS
  Filled 2020-11-16: qty 16

## 2020-11-16 MED ORDER — FLUOROURACIL CHEMO INJECTION 2.5 GM/50ML
400.0000 mg/m2 | Freq: Once | INTRAVENOUS | Status: AC
  Administered 2020-11-16: 900 mg via INTRAVENOUS
  Filled 2020-11-16: qty 18

## 2020-11-16 NOTE — Progress Notes (Signed)
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  Patient receiving folfiri and zirabev.    Spoke with patient via phone.  Patient reports that her appetite is not good. She reports that her tastes is "off".  Says that she is drinking 1 ensure or boost 350 calorie shake a day.  She says that milkshakes and mandarin oranges still taste ok but has not tried anything else.  Drinking coffee and tea.      Medications: reviewed  Labs: reviewed  Anthropometrics:   Weight 208 lb today  216 lb on 8/16 226 lb on 7/19 225 lb on 7/12 228 lb on 6/28 263 lb on 2/1  NUTRITION DIAGNOSIS: Inadequate oral intake continues    INTERVENTION:  Patient may benefit from trial of appetite stimulant. Message sent to provider Reviewed strategies to help with taste change. Encouraged patient to nibble q 1 hour Encouraged patient to purchase boost VHC (530 calorie shake) for more calories and protein. Shared that it is available via [employer], EVALUATION, GOAL: weight trends, intake   NEXT VISIT: Tuesday, Sept 13 during infusion  Sache Sane B. Zenia Resides, [place], [place] Registered Dietitian [phone] (mobile)

## 2020-11-16 NOTE — Patient Instructions (Signed)
[facility]  Discharge Instructions: Thank you for choosing [facility] to provide your oncology and hematology care.  If you have a lab appointment with the [facility], please go directly to the [facility] and check in at the registration area.  Wear comfortable clothing and clothing appropriate for easy access to any Portacath or PICC line.   We strive to give you quality time with your provider. You may need to reschedule your appointment if you arrive late (15 or more minutes).  Arriving late affects you and other patients whose appointments are after yours.  Also, if you miss three or more appointments without notifying the office, you may be dismissed from the clinic at the provider's discretion.      For prescription refill requests, have your pharmacy contact our office and allow 72 hours for refills to be completed.    Today you received the following chemotherapy and/or immunotherapy agents ZIRABEV, IRINOTECAN, 5 FU, POTASSIUM       To help prevent nausea and vomiting after your treatment, we encourage you to take your nausea medication as directed.  BELOW ARE SYMPTOMS THAT SHOULD BE REPORTED IMMEDIATELY: *FEVER GREATER THAN 100.4 F (38 C) OR HIGHER *CHILLS OR SWEATING *NAUSEA AND VOMITING THAT IS NOT CONTROLLED WITH YOUR NAUSEA MEDICATION *UNUSUAL SHORTNESS OF BREATH *UNUSUAL BRUISING OR BLEEDING *URINARY PROBLEMS (pain or burning when urinating, or frequent urination) *BOWEL PROBLEMS (unusual diarrhea, constipation, pain near the anus) TENDERNESS IN MOUTH AND THROAT WITH OR WITHOUT PRESENCE OF ULCERS (sore throat, sores in mouth, or a toothache) UNUSUAL RASH, SWELLING OR PAIN  UNUSUAL VAGINAL DISCHARGE OR ITCHING   Items with * indicate a potential emergency and should be followed up as soon as possible or go to the Emergency Department if any problems should occur.  Please show the CHEMOTHERAPY ALERT CARD or  IMMUNOTHERAPY ALERT CARD at check-in to the Emergency Department and triage nurse.  Should you have questions after your visit or need to cancel or reschedule your appointment, please contact [facility]  [phone] and follow the prompts.  Office hours are 8:00 a.m. to 4:30 p.m. Monday - Friday. Please note that voicemails left after 4:00 p.m. may not be returned until the following business day.  We are closed weekends and major holidays. You have access to a nurse at all times for urgent questions. Please call the main number to the clinic [phone] and follow the prompts.  For any non-urgent questions, you may also contact your provider using MyChart. We now offer e-Visits for anyone 31 and older to request care online for non-urgent symptoms. For details visit [email or web address].   Also download the MyChart app! Go to the app store, search "MyChart", open the app, select [facility], and log in with your MyChart username and password.  Due to Covid, a mask is required upon entering the hospital/clinic. If you do not have a mask, one will be given to you upon arrival. For doctor visits, patients may have 1 support person aged 38 or older with them. For treatment visits, patients cannot have anyone with them due to current Covid guidelines and our immunocompromised population.   Bevacizumab injection What is this medication? BEVACIZUMAB (be va SIZ yoo mab) is a monoclonal antibody. It is used to treatmany types of cancer. This medicine may be used for other purposes; ask your health care provider orpharmacist if you have questions. COMMON BRAND NAME(S): Avastin, MVASI, Zirabev What should  I tell my care team before I take this medication? They need to know if you have any of these conditions: diabetes heart disease high blood pressure history of coughing up blood prior anthracycline chemotherapy (e.g., doxorubicin, daunorubicin,  epirubicin) recent or ongoing radiation therapy recent or planning to have surgery stroke an unusual or allergic reaction to bevacizumab, hamster proteins, mouse proteins, other medicines, foods, dyes, or preservatives pregnant or trying to get pregnant breast-feeding How should I use this medication? This medicine is for infusion into a vein. It is given by a health careprofessional in a hospital or clinic setting. Talk to your pediatrician regarding the use of this medicine in children.Special care may be needed. Overdosage: If you think you have taken too much of this medicine contact apoison control center or emergency room at once. NOTE: This medicine is only for you. Do not share this medicine with others. What if I miss a dose? It is important not to miss your dose. Call your doctor or health careprofessional if you are unable to keep an appointment. What may interact with this medication? Interactions are not expected. This list may not describe all possible interactions. Give your health care provider a list of all the medicines, herbs, non-prescription drugs, or dietary supplements you use. Also tell them if you smoke, drink alcohol, or use illegaldrugs. Some items may interact with your medicine. What should I watch for while using this medication? Your condition will be monitored carefully while you are receiving this medicine. You will need important blood work and urine testing done while youare taking this medicine. This medicine may increase your risk to bruise or bleed. Call your doctor orhealth care professional if you notice any unusual bleeding. Before having surgery, talk to your health care provider to make sure it is ok. This drug can increase the risk of poor healing of your surgical site or wound. You will need to stop this drug for 28 days before surgery. After surgery, wait at least 28 days before restarting this drug. Make sure the surgical site or wound is healed  enough before restarting this drug. Talk to your health careprovider if questions. Do not become pregnant while taking this medicine or for 6 months after stopping it. Women should inform their doctor if they wish to become pregnant or think they might be pregnant. There is a potential for serious side effects to an unborn child. Talk to your health care professional or pharmacist for more information. Do not breast-feed an infant while taking this medicine andfor 6 months after the last dose. This medicine has caused ovarian failure in some women. This medicine may interfere with the ability to have a child. You should talk to your doctor orhealth care professional if you are concerned about your fertility. What side effects may I notice from receiving this medication? Side effects that you should report to your doctor or health care professionalas soon as possible: allergic reactions like skin rash, itching or hives, swelling of the face, lips, or tongue chest pain or chest tightness chills coughing up blood high fever seizures severe constipation signs and symptoms of bleeding such as bloody or black, tarry stools; red or dark-brown urine; spitting up blood or brown material that looks like coffee grounds; red spots on the skin; unusual bruising or bleeding from the eye, gums, or nose signs and symptoms of a blood clot such as breathing problems; chest pain; severe, sudden headache; pain, swelling, warmth in the leg signs and symptoms of  a stroke like changes in vision; confusion; trouble speaking or understanding; severe headaches; sudden numbness or weakness of the face, arm or leg; trouble walking; dizziness; loss of balance or coordination stomach pain sweating swelling of legs or ankles vomiting weight gain Side effects that usually do not require medical attention (report to yourdoctor or health care professional if they continue or are bothersome): back pain changes in  taste decreased appetite dry skin nausea tiredness This list may not describe all possible side effects. Call your doctor for medical advice about side effects. You may report side effects to FDA at1-800-FDA-1088. Where should I keep my medication? This drug is given in a hospital or clinic and will not be stored at home. NOTE: This sheet is a summary. It may not cover all possible information. If you have questions about this medicine, talk to your doctor, pharmacist, orhealth care provider.  2022 Elsevier/Gold Standard (2019-01-01 10:50:46)   Irinotecan injection What is this medication? IRINOTECAN (ir in oh TEE kan ) is a chemotherapy drug. It is used to treatcolon and rectal cancer. This medicine may be used for other purposes; ask your health care provider orpharmacist if you have questions. COMMON BRAND NAME(S): Camptosar What should I tell my care team before I take this medication? They need to know if you have any of these conditions: dehydration diarrhea infection (especially a virus infection such as chickenpox, cold sores, or herpes) liver disease low blood counts, like low white cell, platelet, or red cell counts low levels of calcium, magnesium, or potassium in the blood recent or ongoing radiation therapy an unusual or allergic reaction to irinotecan, other medicines, foods, dyes, or preservatives pregnant or trying to get pregnant breast-feeding How should I use this medication? This drug is given as an infusion into a vein. It is administered in a hospitalor clinic by a specially trained health care professional. Talk to your pediatrician regarding the use of this medicine in children.Special care may be needed. Overdosage: If you think you have taken too much of this medicine contact apoison control center or emergency room at once. NOTE: This medicine is only for you. Do not share this medicine with others. What if I miss a dose? It is important not to miss your  dose. Call your doctor or health careprofessional if you are unable to keep an appointment. What may interact with this medication? Do not take this medicine with any of the following medications: cobicistat itraconazole This medicine may interact with the following medications: antiviral medicines for HIV or AIDS certain antibiotics like rifampin or rifabutin certain medicines for fungal infections like ketoconazole, posaconazole, and voriconazole certain medicines for seizures like carbamazepine, phenobarbital, phenotoin clarithromycin gemfibrozil nefazodone [facility]. John's Wort This list may not describe all possible interactions. Give your health care provider a list of all the medicines, herbs, non-prescription drugs, or dietary supplements you use. Also tell them if you smoke, drink alcohol, or use illegaldrugs. Some items may interact with your medicine. What should I watch for while using this medication? Your condition will be monitored carefully while you are receiving this medicine. You will need important blood work done while you are taking thismedicine. This drug may make you feel generally unwell. This is not uncommon, as chemotherapy can affect healthy cells as well as cancer cells. Report any side effects. Continue your course of treatment even though you feel ill unless yourdoctor tells you to stop. In some cases, you may be given additional medicines to help with side  effects.Follow all directions for their use. You may get drowsy or dizzy. Do not drive, use machinery, or do anything that needs mental alertness until you know how this medicine affects you. Do not stand or sit up quickly, especially if you are an older patient. This reducesthe risk of dizzy or fainting spells. Call your health care professional for advice if you get a fever, chills, or sore throat, or other symptoms of a cold or flu. Do not treat yourself. This medicine decreases your body's ability to fight  infections. Try to avoid beingaround people who are sick. Avoid taking products that contain aspirin, acetaminophen, ibuprofen, naproxen, or ketoprofen unless instructed by your doctor. These medicines may hide afever. This medicine may increase your risk to bruise or bleed. Call your doctor orhealth care professional if you notice any unusual bleeding. Be careful brushing and flossing your teeth or using a toothpick because you may get an infection or bleed more easily. If you have any dental work done,tell your dentist you are receiving this medicine. Do not become pregnant while taking this medicine or for 6 months after stopping it. Women should inform their health care professional if they wish to become pregnant or think they might be pregnant. Men should not father a child while taking this medicine and for 3 months after stopping it. There is potential for serious side effects to an unborn child. Talk to your health careprofessional for more information. Do not breast-feed an infant while taking this medicine or for 7 days afterstopping it. This medicine has caused ovarian failure in some women. This medicine may make it more difficult to get pregnant. Talk to your health care professional if [place] concerned about your fertility. This medicine has caused decreased sperm counts in some men. This may make it more difficult to father a child. Talk to your health care professional if [place] concerned about your fertility. What side effects may I notice from receiving this medication? Side effects that you should report to your doctor or health care professionalas soon as possible: allergic reactions like skin rash, itching or hives, swelling of the face, lips, or tongue chest pain diarrhea flushing, runny nose, sweating during infusion low blood counts - this medicine may decrease the number of white blood cells, red blood cells and platelets. You may be at increased risk for infections and  bleeding. nausea, vomiting pain, swelling, warmth in the leg signs of decreased platelets or bleeding - bruising, pinpoint red spots on the skin, black, tarry stools, blood in the urine signs of infection - fever or chills, cough, sore throat, pain or difficulty passing urine signs of decreased red blood cells - unusually weak or tired, fainting spells, lightheadedness Side effects that usually do not require medical attention (report to yourdoctor or health care professional if they continue or are bothersome): constipation hair loss headache loss of appetite mouth sores stomach pain This list may not describe all possible side effects. Call your doctor for medical advice about side effects. You may report side effects to FDA at1-800-FDA-1088. Where should I keep my medication? This drug is given in a hospital or clinic and will not be stored at home. NOTE: This sheet is a summary. It may not cover all possible information. If you have questions about this medicine, talk to your doctor, pharmacist, orhealth care provider.  2022 Elsevier/Gold Standard (2019-02-04 17:46:13)  Fluorouracil, 5-FU injection What is this medication? FLUOROURACIL, 5-FU (flure oh YOOR a sil) is a chemotherapy drug. It slows  the growth of cancer cells. This medicine is used to treat many types of cancer like breast cancer, colon or rectal cancer, pancreatic cancer, and stomachcancer. This medicine may be used for other purposes; ask your health care provider orpharmacist if you have questions. COMMON BRAND NAME(S): Adrucil What should I tell my care team before I take this medication? They need to know if you have any of these conditions: blood disorders dihydropyrimidine dehydrogenase (DPD) deficiency infection (especially a virus infection such as chickenpox, cold sores, or herpes) kidney disease liver disease malnourished, poor nutrition recent or ongoing radiation therapy an unusual or allergic reaction  to fluorouracil, other chemotherapy, other medicines, foods, dyes, or preservatives pregnant or trying to get pregnant breast-feeding How should I use this medication? This drug is given as an infusion or injection into a vein. It is administeredin a hospital or clinic by a specially trained health care professional. Talk to your pediatrician regarding the use of this medicine in children.Special care may be needed. Overdosage: If you think you have taken too much of this medicine contact apoison control center or emergency room at once. NOTE: This medicine is only for you. Do not share this medicine with others. What if I miss a dose? It is important not to miss your dose. Call your doctor or health careprofessional if you are unable to keep an appointment. What may interact with this medication? Do not take this medicine with any of the following medications: live virus vaccines This medicine may also interact with the following medications: medicines that treat or prevent blood clots like warfarin, enoxaparin, and dalteparin This list may not describe all possible interactions. Give your health care provider a list of all the medicines, herbs, non-prescription drugs, or dietary supplements you use. Also tell them if you smoke, drink alcohol, or use illegaldrugs. Some items may interact with your medicine. What should I watch for while using this medication? Visit your doctor for checks on your progress. This drug may make you feel generally unwell. This is not uncommon, as chemotherapy can affect healthy cells as well as cancer cells. Report any side effects. Continue your course oftreatment even though you feel ill unless your doctor tells you to stop. In some cases, you may be given additional medicines to help with side effects.Follow all directions for their use. Call your doctor or health care professional for advice if you get a fever, chills or sore throat, or other symptoms of a cold or  flu. Do not treat yourself. This drug decreases your body's ability to fight infections. Try toavoid being around people who are sick. This medicine may increase your risk to bruise or bleed. Call your doctor orhealth care professional if you notice any unusual bleeding. Be careful brushing and flossing your teeth or using a toothpick because you may get an infection or bleed more easily. If you have any dental work done,tell your dentist you are receiving this medicine. Avoid taking products that contain aspirin, acetaminophen, ibuprofen, naproxen, or ketoprofen unless instructed by your doctor. These medicines may hide afever. Do not become pregnant while taking this medicine. Women should inform their doctor if they wish to become pregnant or think they might be pregnant. There is a potential for serious side effects to an unborn child. Talk to your health care professional or pharmacist for more information. Do not breast-feed aninfant while taking this medicine. Men should inform their doctor if they wish to father a child. This medicinemay lower sperm counts. Do  not treat diarrhea with over the counter products. Contact your doctor ifyou have diarrhea that lasts more than 2 days or if it is severe and watery. This medicine can make you more sensitive to the sun. Keep out of the sun. If you cannot avoid being in the sun, wear protective clothing and use sunscreen.Do not use sun lamps or tanning beds/booths. What side effects may I notice from receiving this medication? Side effects that you should report to your doctor or health care professionalas soon as possible: allergic reactions like skin rash, itching or hives, swelling of the face, lips, or tongue low blood counts - this medicine may decrease the number of white blood cells, red blood cells and platelets. You may be at increased risk for infections and bleeding. signs of infection - fever or chills, cough, sore throat, pain or difficulty  passing urine signs of decreased platelets or bleeding - bruising, pinpoint red spots on the skin, black, tarry stools, blood in the urine signs of decreased red blood cells - unusually weak or tired, fainting spells, lightheadedness breathing problems changes in vision chest pain mouth sores nausea and vomiting pain, swelling, redness at site where injected pain, tingling, numbness in the hands or feet redness, swelling, or sores on hands or feet stomach pain unusual bleeding Side effects that usually do not require medical attention (report to yourdoctor or health care professional if they continue or are bothersome): changes in finger or toe nails diarrhea dry or itchy skin hair loss headache loss of appetite sensitivity of eyes to the light stomach upset unusually teary eyes This list may not describe all possible side effects. Call your doctor for medical advice about side effects. You may report side effects to FDA at1-800-FDA-1088. Where should I keep my medication? This drug is given in a hospital or clinic and will not be stored at home. NOTE: This sheet is a summary. It may not cover all possible information. If you have questions about this medicine, talk to your doctor, pharmacist, orhealth care provider.  2022 Elsevier/Gold Standard (2019-02-04 15:00:03)   Potassium acetate injection What is this medication? POTASSIUM ACETATE (poe TASS i um ASa tate) is a potassium supplement used to prevent and to treat low potassium. Potassium is important for the heart, muscles, and nerves. Too much or too little potassium in the body can cause serious problems. This medicine may be used for other purposes; ask your health care provider or pharmacist if you have questions. What should I tell my care team before I take this medication? They need to know if you have any of these conditions: Addison's disease dehydration diabetes heart disease high levels of potassium in the  blood irregular heartbeat kidney disease liver disease recent severe burn an unusual or allergic reaction to potassium, other medicines, foods, dyes, or preservatives pregnant or trying to get pregnant breast-feeding How should I use this medication? This medicine is for infusion into a vein. It is given by a health care professional in a hospital or clinic setting. Talk to your pediatrician regarding the use of this medicine in children. Special care may be needed. Overdosage: If you think you have taken too much of this medicine contact a poison control center or emergency room at once. NOTE: This medicine is only for you. Do not share this medicine with others. What if I miss a dose? This does not apply. What may interact with this medication? Do not take this medicine with any of the following medications: certain  diuretics such as spironolactone, triamterene eplerenone sodium polystyrene sulfonate This medicine may also interact with the following medications: certain medicines for blood pressure or heart disease like lisinopril, losartan, quinapril, valsartan medicines that lower your chance of fighting infection such as cyclosporine, tacrolimus NSAIDs, medicines for pain and inflammation, like ibuprofen or naproxen other potassium supplements salt substitutes This list may not describe all possible interactions. Give your health care provider a list of all the medicines, herbs, non-prescription drugs, or dietary supplements you use. Also tell them if you smoke, drink alcohol, or use illegal drugs. Some items may interact with your medicine. What should I watch for while using this medication? Your condition will be monitored carefully while you are receiving this medicine. You may need blood work done while you are taking this medicine. What side effects may I notice from receiving this medication? Side effects that you should report to your doctor or health care professional as  soon as possible: allergic reactions like skin rash, itching or hives, swelling of the face, lips, or tongue breathing problems confusion fast, irregular heartbeat feeling faint or lightheaded, falls low blood pressure numbness or tingling in hands or feet pain, redness, or irritation at site where injected unusually weak or tired weakness, heaviness of legs Side effects that usually do not require medical attention (report these to your doctor or health care professional if they continue or are bothersome): diarrhea nausea, vomiting stomach pain This list may not describe all possible side effects. Call your doctor for medical advice about side effects. You may report side effects to FDA at 1-800-FDA-1088. Where should I keep my medication? This drug is given in a hospital or clinic and will not be stored at home. NOTE: This sheet is a summary. It may not cover all possible information. If you have questions about this medicine, talk to your doctor, pharmacist, or health care provider.  2022 Elsevier/Gold Standard (2015-12-08 09:51:49)

## 2020-11-16 NOTE — Progress Notes (Signed)
No appetite has lost 8 pounds since 11/02/20. Also mentioned having SOB that has came back within the last week.

## 2020-11-16 NOTE — Progress Notes (Signed)
Per DR Grayland Ormond ok to infuse with neut 0.7 and urine protein 100.

## 2020-11-18 ENCOUNTER — Inpatient Hospital Stay: Payer: Medicare Other | Attending: Oncology

## 2020-11-18 ENCOUNTER — Other Ambulatory Visit: Payer: Self-pay

## 2020-11-18 DIAGNOSIS — D649 Anemia, unspecified: Secondary | ICD-10-CM | POA: Insufficient documentation

## 2020-11-18 DIAGNOSIS — Z7901 Long term (current) use of anticoagulants: Secondary | ICD-10-CM | POA: Insufficient documentation

## 2020-11-18 DIAGNOSIS — C184 Malignant neoplasm of transverse colon: Secondary | ICD-10-CM | POA: Diagnosis present

## 2020-11-18 DIAGNOSIS — E876 Hypokalemia: Secondary | ICD-10-CM | POA: Diagnosis not present

## 2020-11-18 DIAGNOSIS — Z5111 Encounter for antineoplastic chemotherapy: Secondary | ICD-10-CM | POA: Diagnosis not present

## 2020-11-18 DIAGNOSIS — C786 Secondary malignant neoplasm of retroperitoneum and peritoneum: Secondary | ICD-10-CM | POA: Insufficient documentation

## 2020-11-18 DIAGNOSIS — Z86718 Personal history of other venous thrombosis and embolism: Secondary | ICD-10-CM | POA: Insufficient documentation

## 2020-11-18 DIAGNOSIS — Z79899 Other long term (current) drug therapy: Secondary | ICD-10-CM | POA: Diagnosis not present

## 2020-11-18 MED ORDER — SODIUM CHLORIDE 0.9% FLUSH
10.0000 mL | INTRAVENOUS | Status: DC | PRN
  Administered 2020-11-18: 10 mL
  Filled 2020-11-18: qty 10

## 2020-11-18 MED ORDER — HEPARIN SOD (PORK) LOCK FLUSH 100 UNIT/ML IV SOLN
500.0000 [IU] | Freq: Once | INTRAVENOUS | Status: AC | PRN
  Administered 2020-11-18: 500 [IU]
  Filled 2020-11-18: qty 5

## 2020-11-23 ENCOUNTER — Other Ambulatory Visit: Payer: Self-pay

## 2020-11-23 ENCOUNTER — Ambulatory Visit
Admission: RE | Admit: 2020-11-23 | Discharge: 2020-11-23 | Disposition: A | Discharge status: Home / Self Care | Payer: Medicare Other | Source: Ambulatory Visit | Attending: Oncology | Admitting: Oncology

## 2020-11-23 DIAGNOSIS — C184 Malignant neoplasm of transverse colon: Secondary | ICD-10-CM | POA: Diagnosis not present

## 2020-11-23 MED ORDER — IOHEXOL 350 MG/ML SOLN
100.0000 mL | Freq: Once | INTRAVENOUS | Status: AC | PRN
  Administered 2020-11-23: 100 mL via INTRAVENOUS

## 2020-11-27 NOTE — Progress Notes (Signed)
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  INTERVAL HISTORY: Patient returns to clinic today for further evaluation, discussion of her imaging results, and consideration of cycle 7 of FOLFIRI plus Avastin.  She continues to have chronic weakness and fatigue and a poor appetite. She has no neurologic complaints.  She denies any recent fevers or illnesses.  She denies any chest pain, shortness of breath, cough, hemoptysis.  She denies any nausea, vomiting, constipation, or diarrhea.  She has no melena or hematochezia.  She has no urinary complaints.  She has no further leg pain.  Patient offers no further specific complaints today.  REVIEW OF SYSTEMS:   Review of Systems  Constitutional:  Positive for malaise/fatigue. Negative for fever and weight loss.  Respiratory: Negative.  Negative for cough, hemoptysis and shortness of breath.   Cardiovascular: Negative.  Negative for chest pain and leg swelling.  Gastrointestinal: Negative.  Negative for abdominal pain, blood in stool, constipation, diarrhea, melena, nausea and vomiting.  Genitourinary: Negative.  Negative for dysuria.  Musculoskeletal: Negative.  Negative for back pain.  Skin: Negative.  Negative for rash.  Neurological:  Positive for weakness. Negative for dizziness, focal weakness and headaches.  Psychiatric/Behavioral: Negative.  The patient is not nervous/anxious.    As per HPI. Otherwise, a complete review of systems is negative.  PAST MEDICAL HISTORY: Past Medical History:  Diagnosis Date   Arthritis    Chicken pox    Helicobacter  pylori gastritis    Hyperlipidemia    Hypertension    Metastatic colon cancer in female [facility])    Obesity    Sleep apnea    Thyroid disease     PAST SURGICAL HISTORY: Past Surgical History:  Procedure Laterality Date   ABDOMINAL HYSTERECTOMY  12/2009   total   COLONOSCOPY WITH PROPOFOL N/A 08/13/2020   Procedure: COLONOSCOPY WITH PROPOFOL;  Surgeon: Jonathon Bellows, MD;  Location: [facility] ENDOSCOPY;  Service: Gastroenterology;  Laterality: N/A;   ESOPHAGOGASTRODUODENOSCOPY (EGD) WITH PROPOFOL N/A 08/13/2020   Procedure: ESOPHAGOGASTRODUODENOSCOPY (EGD) WITH PROPOFOL;  Surgeon: Jonathon Bellows, MD;  Location: [facility] ENDOSCOPY;  Service: Gastroenterology;  Laterality: N/A;   IR IMAGING GUIDED PORT INSERTION  08/27/2020   supracervical abdominal hysterectomy and bilateral salpingo--oophorectomy 01-17-2010 for fibroids  01/17/2010   TOTAL THYROIDECTOMY  1991-92   TUBAL LIGATION      FAMILY HISTORY: Family History  Problem Relation Age of Onset   Stroke Father    Diabetes Maternal Grandmother    Hypertension Maternal Grandmother    Diabetes Maternal Uncle    Cancer Maternal Aunt        Breast    ADVANCED DIRECTIVES (Y/N):  N  HEALTH MAINTENANCE: Social History   Tobacco Use   Smoking status: Never   Smokeless tobacco: Never  Vaping Use   Vaping Use: Never used  Substance Use Topics   Alcohol use: No    Alcohol/week: 0.0 standard drinks   Drug use: No     Colonoscopy:  PAP:  Bone density:  Lipid panel:  Allergies  Allergen Reactions   Erythromycin Itching    Current Outpatient Medications  Medication Sig Dispense Refill   atorvastatin (LIPITOR) 10 MG  tablet TAKE 1 TABLET(10 MG) BY MOUTH DAILY 90 tablet 0   clobetasol ointment (TEMOVATE) 0.05 % Apply to affected area every night for 4 weeks, then every other day for 4 weeks and then twice a week for 4 weeks or until resolution. 30 g 5   dexamethasone (DECADRON) 4 MG tablet Take 1 tablet (4 mg total) by mouth daily. 30 tablet  0   feeding supplement (ENSURE ENLIVE / ENSURE PLUS) LIQD Take 237 mLs by mouth 3 (three) times daily between meals. 237 mL 12   levothyroxine (SYNTHROID) 150 MCG tablet Take 1 tablet (150 mcg total) by mouth daily. 90 tablet 3   potassium chloride SA (KLOR-CON) 20 MEQ tablet Take 1 tablet (20 mEq total) by mouth 2 (two) times daily. 60 tablet 0   rivaroxaban (XARELTO) 20 MG TABS tablet Take 1 tablet (20 mg total) by mouth daily with supper. Start on final full day of lovenox injections. 30 tablet 0   enoxaparin (LOVENOX) 100 MG/ML injection Inject 1 mL (100 mg total) into the skin every 12 (twelve) hours. (Patient not taking: No sig reported) 20 mL 0   oxyCODONE-acetaminophen (PERCOCET/ROXICET) 5-325 MG tablet Take 1 tablet by mouth every 4 (four) hours as needed for severe pain. (Patient not taking: No sig reported) 30 tablet 0   pantoprazole (PROTONIX) 40 MG tablet Take 1 tablet (40 mg total) by mouth 2 (two) times daily. (Patient not taking: Reported on 11/16/2020) 60 tablet 0   prochlorperazine (COMPAZINE) 10 MG tablet Take 10 mg by mouth 2 (two) times daily as needed for nausea or vomiting. (Patient not taking: No sig reported)     No current facility-administered medications for this visit.   Facility-Administered Medications Ordered in Other Visits  Medication Dose Route Frequency Provider Last Rate Last Admin   sodium chloride 0.9 % with potassium chloride 40 mEq, magnesium sulfate infusion   Intravenous Once Lloyd Huger, MD        OBJECTIVE: Vitals:   11/30/20 0908  BP: 122/84  Pulse: 91  Resp: 16  Temp: (!) 96.7 F (35.9 C)  SpO2: 100%     Body mass index is 32.44 kg/m.    ECOG FS:0 - Asymptomatic  General: Well-developed, well-nourished, no acute distress. Eyes: Pink conjunctiva, anicteric sclera. HEENT: Normocephalic, moist mucous membranes. Lungs: No audible wheezing or coughing. Heart: Regular rate and rhythm. Abdomen: Soft, nontender, no obvious  distention. Musculoskeletal: No edema, cyanosis, or clubbing. Neuro: Alert, answering all questions appropriately. Cranial nerves grossly intact. Skin: No rashes or petechiae noted. Psych: Normal affect.   LAB RESULTS:  Lab Results  Component Value Date   NA 136 11/30/2020   K 2.7 (LL) 11/30/2020   CL 94 (L) 11/30/2020   CO2 29 11/30/2020   GLUCOSE 98 11/30/2020   BUN 7 (L) 11/30/2020   CREATININE 0.68 11/30/2020   CALCIUM 8.3 (L) 11/30/2020   PROT 7.7 11/30/2020   ALBUMIN 3.3 (L) 11/30/2020   AST 20 11/30/2020   ALT 9 11/30/2020   ALKPHOS 81 11/30/2020   BILITOT 0.7 11/30/2020   GFRNONAA >60 11/30/2020   GFRAA  01/10/2010    >60        The eGFR has been calculated using the MDRD equation. This calculation has not been validated in all clinical situations. eGFR's persistently <60 mL/min signify possible Chronic Kidney Disease.    Lab Results  Component Value Date   WBC 4.0 11/30/2020   NEUTROABS 0.3 (LL) 11/30/2020   HGB 11.7 (L)  11/30/2020   HCT 36.7 11/30/2020   MCV 85.3 11/30/2020   PLT 490 (H) 11/30/2020     STUDIES: CT CHEST ABDOMEN PELVIS W CONTRAST  Result Date: 11/23/2020 CLINICAL DATA:  Adenocarcinoma transverse colon. Chemotherapy initiated. Peritoneal metastasis EXAM: CT CHEST, ABDOMEN, AND PELVIS WITH CONTRAST TECHNIQUE: Multidetector CT imaging of the chest, abdomen and pelvis was performed following the standard protocol during bolus administration of intravenous contrast. CONTRAST:  187mL OMNIPAQUE IOHEXOL 350 MG/ML SOLN COMPARISON:  CT 08/02/2020 com PET-CT 08/25/2020 FINDINGS: CT CHEST FINDINGS Cardiovascular: Port in the anterior chest wall with tip in distal SVC. No significant vascular findings. Normal heart size. No pericardial effusion. Mediastinum/Nodes: No axillary supraclavicular adenopathy no mediastinal hilar adenopathy. No pericardial effusion. Lungs/Pleura: Near complete resolution of LEFT pleural effusion. Small RIGHT effusion remains  with mild basilar atelectasis. No suspicious pulmonary nodularity. Musculoskeletal: No aggressive osseous lesion. Again demonstrated rounded lesions in the subareolar LEFT breast. Lesion is not hypermetabolic on prior PET-CT scan. CT ABDOMEN AND PELVIS FINDINGS Hepatobiliary: No focal hepatic lesion.  Gallbladder normal Pancreas: Pancreas is normal. No ductal dilatation. No pancreatic inflammation. Spleen: Normal spleen Adrenals/urinary tract: Adrenal glands and kidneys are normal. The ureters and bladder normal. Stomach/Bowel: Stomach duodenum small-bowel normal. Terminal ileum cecum normal. Appendix not identified. Luminal narrowing in the mid transverse colon is similar prior (image 75/2) however the wall thickening may be slightly decreased. However, there is decrease in the nodular lesions in the greater omentum inferior to the colon in the lesser omentum superior to the colon. Example lesion in the lesser omentum measuring 12 mm (image 71/2) decreased from 31 mm. Nodularity along the ventral peritoneal surface beneath the LEFT rectus muscle is now difficult to measure. Likewise nodularity in the greater omentum inferior to the transverse colon is less conspicuous. No evidence of bowel obstruction. Distal colon rectosigmoid colon normal. There is small fluid collection along the rectosigmoid junction measuring 2.9 cm which is decreased in volume from prior. Fluid is non organized consistent simple fluid/peritoneal fluid. Vascular/Lymphatic: Abdominal aorta is normal caliber. There is no retroperitoneal or periportal lymphadenopathy. No pelvic lymphadenopathy. Reproductive: Uterus normal Other: No free fluid. Musculoskeletal: No aggressive osseous lesion. IMPRESSION: Chest Impression: 1. No evidence of thoracic metastasis. 2. Resolution of LEFT pleural effusion. Abdomen / Pelvis Impression: 1. Measurable decrease in omental nodularity in the lesser omentum and greater omentum. Decreased nodularity along the  ventral peritoneal surface. No new peritoneal disease. 2. Persistent luminal narrowing through the mid transverse colon. 3. No evidence of liver metastasis. 4. No evidence of abdominal lymphadenopathy. 5. Decrease in intraperitoneal free fluid. Electronically Signed   By: Suzy Bouchard M.D.   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  PLAN:    1.  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  Pretreatment CEA was 313, today's result is pending.  Patient completed cycle 2 of FOLFOX plus Avastin, but then had a delayed reaction possibly secondary to oxaliplatin.  Oxaliplatin has been discontinued.  Delay cycle 7 of FOLFIRI plus Avastin today secondary to neutropenia.  Will add Neulasta with pump off for the remainder of the cycles.  Return to clinic in 1 week for further evaluation and reconsideration of cycle 7.   2.  Abdominal pain: Resolved  Continue Percocet  as needed. 3.  Renal insufficiency: Resolved. 4.  Hypokalemia: Potassium remains decreased to 2.7.  Proceed with 40 mEq of IV potassium and patient's oral supplementation was increased to 40 mEq daily. 5.  Nausea and vomiting: Resolved.  Continue antiemetics as prescribed. 6.  Poor appetite: Chronic and unchanged.  Patient was given a prescription for dexamethasone today.  Appreciate dietary input. 7.  Neutropenia: Delay treatment as above and add Neulasta for subsequent treatments. 8.  PE/DVT: Patient noted to have new onset symptoms in her right lower extremity which revealed a large DVT while taking Eliquis. 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  9. Anemia: Hemoglobin mildly  improved to 11.7.  Patient expressed understanding and was in agreement with this plan. She also understands that She can call clinic at any time with any questions, concerns, or complaints.   Cancer Staging Adenocarcinoma of transverse colon [facility]) Staging form: Colon and Rectum - Neuroendocine Tumors, AJCC 8th Edition - Clinical stage from 08/18/2020: Stage IV (cTX, cNX, pM1b) - Signed by Lloyd Huger, MD on 08/18/2020 Stage prefix: Initial diagnosis   Lloyd Huger, MD   11/30/2020 10:08 AM

## 2020-11-30 ENCOUNTER — Inpatient Hospital Stay: Payer: Medicare Other

## 2020-11-30 ENCOUNTER — Inpatient Hospital Stay (HOSPITAL_BASED_OUTPATIENT_CLINIC_OR_DEPARTMENT_OTHER): Payer: Medicare Other | Admitting: Oncology

## 2020-11-30 ENCOUNTER — Other Ambulatory Visit: Payer: Self-pay | Admitting: Oncology

## 2020-11-30 ENCOUNTER — Other Ambulatory Visit: Payer: Self-pay

## 2020-11-30 VITALS — BP 122/84 | HR 91 | Temp 96.7°F | Resp 16 | Wt 201.0 lb

## 2020-11-30 DIAGNOSIS — Z5111 Encounter for antineoplastic chemotherapy: Secondary | ICD-10-CM | POA: Diagnosis not present

## 2020-11-30 DIAGNOSIS — C184 Malignant neoplasm of transverse colon: Secondary | ICD-10-CM

## 2020-11-30 LAB — COMPREHENSIVE METABOLIC PANEL
ALT: 9 U/L (ref 0–44)
AST: 20 U/L (ref 15–41)
Albumin: 3.3 g/dL — ABNORMAL LOW (ref 3.5–5.0)
Alkaline Phosphatase: 81 U/L (ref 38–126)
Anion gap: 13 (ref 5–15)
BUN: 7 mg/dL — ABNORMAL LOW (ref 8–23)
CO2: 29 mmol/L (ref 22–32)
Calcium: 8.3 mg/dL — ABNORMAL LOW (ref 8.9–10.3)
Chloride: 94 mmol/L — ABNORMAL LOW (ref 98–111)
Creatinine, Ser: 0.68 mg/dL (ref 0.44–1.00)
GFR, Estimated: >60 mL/min (ref >60)
Glucose, Bld: 98 mg/dL (ref 70–99)
Potassium: 2.7 mmol/L — CL (ref 3.5–5.1)
Sodium: 136 mmol/L (ref 135–145)
Total Bilirubin: 0.7 mg/dL (ref 0.3–1.2)
Total Protein: 7.7 g/dL (ref 6.5–8.1)

## 2020-11-30 LAB — CBC WITH DIFFERENTIAL/PLATELET
Abs Immature Granulocytes: 0 10*3/uL (ref 0.00–0.07)
Basophils Absolute: 0.1 10*3/uL (ref 0.0–0.1)
Basophils Relative: 1 %
Eosinophils Absolute: 0.1 10*3/uL (ref 0.0–0.5)
Eosinophils Relative: 2 %
HCT: 36.7 % (ref 36.0–46.0)
Hemoglobin: 11.7 g/dL — ABNORMAL LOW (ref 12.0–15.0)
Immature Granulocytes: 0 %
Lymphocytes Relative: 72 %
Lymphs Abs: 2.9 10*3/uL (ref 0.7–4.0)
MCH: 27.2 pg (ref 26.0–34.0)
MCHC: 31.9 g/dL (ref 30.0–36.0)
MCV: 85.3 fL (ref 80.0–100.0)
Monocytes Absolute: 0.7 10*3/uL (ref 0.1–1.0)
Monocytes Relative: 18 %
Neutro Abs: 0.3 10*3/uL — CL (ref 1.7–7.7)
Neutrophils Relative %: 7 %
Platelets: 490 10*3/uL — ABNORMAL HIGH (ref 150–400)
RBC: 4.3 MIL/uL (ref 3.87–5.11)
RDW: 24.4 % — ABNORMAL HIGH (ref 11.5–15.5)
WBC: 4 10*3/uL (ref 4.0–10.5)
nRBC: 0 % (ref 0.0–0.2)

## 2020-11-30 LAB — URINALYSIS, DIPSTICK ONLY
Bilirubin Urine: NEGATIVE
Glucose, UA: NEGATIVE mg/dL
Hgb urine dipstick: NEGATIVE
Ketones, ur: 20 mg/dL — AB
Leukocytes,Ua: NEGATIVE
Nitrite: NEGATIVE
Protein, ur: 30 mg/dL — AB
Specific Gravity, Urine: 1.015 (ref 1.005–1.030)
pH: 5 (ref 5.0–8.0)

## 2020-11-30 LAB — CEA: CEA: 21.6 ng/mL — ABNORMAL HIGH (ref 0.0–4.7)

## 2020-11-30 MED ORDER — DEXAMETHASONE 4 MG PO TABS: 4.0000 mg | ORAL_TABLET | Freq: Every day | ORAL | 0 refills | Status: DC

## 2020-11-30 MED ORDER — HEPARIN SOD (PORK) LOCK FLUSH 100 UNIT/ML IV SOLN
500.0000 [IU] | Freq: Once | INTRAVENOUS | Status: AC | PRN
  Administered 2020-11-30: 500 [IU]
  Filled 2020-11-30: qty 5

## 2020-11-30 MED ORDER — HEPARIN SOD (PORK) LOCK FLUSH 100 UNIT/ML IV SOLN
INTRAVENOUS | Status: AC
  Filled 2020-11-30: qty 5

## 2020-11-30 MED ORDER — POTASSIUM CHLORIDE CRYS ER 20 MEQ PO TBCR: 20.0000 meq | EXTENDED_RELEASE_TABLET | Freq: Two times a day (BID) | ORAL | 0 refills | Status: DC

## 2020-11-30 MED ORDER — SODIUM CHLORIDE 0.9 % IV SOLN
Freq: Once | INTRAVENOUS | Status: AC
  Filled 2020-11-30: qty 20

## 2020-11-30 MED ORDER — SODIUM CHLORIDE 0.9% FLUSH
10.0000 mL | Freq: Once | INTRAVENOUS | Status: AC
  Administered 2020-11-30: 10 mL via INTRAVENOUS
  Filled 2020-11-30: qty 10

## 2020-11-30 NOTE — Progress Notes (Signed)
Patient having new episodes of abdominal pain for the past week.  Decrease in appetite with 7 lb weight loss since last documented weight.

## 2020-11-30 NOTE — Progress Notes (Signed)
Nutrition Follow-up:  Patient with stage IV adenocarcinoma of colon with peritoneal carcinomatosis.  Patient receiving folfiri and zirabev.  Met with patient during infusion.  Patient reports appetite is still not good.  Says that she is drinking 1 ensure/boost shake a day.  Has not purchased Boost VHC.  Says that she can taste [employer] vanilla and strawberry milkshake and mandarin oranges.  "Nothing else taste good."  Says this is the only thing she is eating. Says that she is having some abdominal pain. Does not report constipation. Had bowel movement yesterday.    Medications: MD adding dexamethasone today  Labs: reviewed  Anthropometrics:   Weight 201 lb today  208 lb on 8/30 216 lb on 8/16 226 lb on 7/19 225 lb on 7/12 228 lb on 6/28 263 lb on 2/1  23% weight loss in the last 7 months, significant  NUTRITION DIAGNOSIS: Inadequate oral intake continues   MALNUTRITION DIAGNOSIS: Patient meets criteria for severe malnutrition in context of chronic illness as evidenced by 23% weight loss in 7 months and eating less than 75% of estimated energy needs   INTERVENTION:  MD adding appetite stimulant today. Encouraged patient to start medication Provided samples of [employer] shake (442 calories) and [employer] 1.4 (455 calorie) shake to try.  RD reviewed how to purchase boost VHC shake. RD does not have samples of this product.   Reviewed dressings to try that are fruity as patient with taste changes    MONITORING, EVALUATION, GOAL: weight trends, intake   NEXT VISIT: Wed, Sept 21 during infusion  Judith Ross, [place], [place] Registered Dietitian [phone] (mobile)

## 2020-11-30 NOTE — Patient Instructions (Signed)
Hypokalemia Hypokalemia means that the amount of potassium in the blood is lower than normal. Potassium is a chemical (electrolyte) that helps regulate the amount of fluid in the body. It also stimulates muscle tightening (contraction) and helps nerves work properly. Normally, most of the body's potassium is inside cells, and only a very small amount is in the blood. Because the amount in the blood is so small, minor changes to potassium levels in the blood can be life-threatening. What are the causes? This condition may be caused by: Antibiotic medicine. Diarrhea or vomiting. Taking too much of a medicine that helps you have a bowel movement (laxative) can cause diarrhea and lead to hypokalemia. Chronic kidney disease (CKD). Medicines that help the body get rid of excess fluid (diuretics). Eating disorders, such as bulimia. Low magnesium levels in the body. Sweating a lot. What are the signs or symptoms? Symptoms of this condition include: Weakness. Constipation. Fatigue. Muscle cramps. Mental confusion. Skipped heartbeats or irregular heartbeat (palpitations). Tingling or numbness. How is this diagnosed? This condition is diagnosed with a blood test. How is this treated? This condition may be treated by: Taking potassium supplements by mouth. Adjusting the medicines that you take. Eating more foods that contain a lot of potassium. If your potassium level is very low, you may need to get potassium through an IV and be monitored in the hospital. Follow these instructions at home:  Take over-the-counter and prescription medicines only as told by your health care provider. This includes vitamins and supplements. Eat a healthy diet. A healthy diet includes fresh fruits and vegetables, whole grains, healthy fats, and lean proteins. If instructed, eat more foods that contain a lot of potassium. This includes: Nuts, such as peanuts and pistachios. Seeds, such as sunflower seeds and  pumpkin seeds. Peas, lentils, and lima beans. Whole grain and bran cereals and breads. Fresh fruits and vegetables, such as apricots, avocado, bananas, cantaloupe, kiwi, oranges, tomatoes, asparagus, and potatoes. Orange juice. Tomato juice. Red meats. Yogurt. Keep all follow-up visits as told by your health care provider. This is important. Contact a health care provider if you: Have weakness that gets worse. Feel your heart pounding or racing. Vomit. Have diarrhea. Have diabetes (diabetes mellitus) and you have trouble keeping your blood sugar (glucose) in your target range. Get help right away if you: Have chest pain. Have shortness of breath. Have vomiting or diarrhea that lasts for more than 2 days. Faint. Summary Hypokalemia means that the amount of potassium in the blood is lower than normal. This condition is diagnosed with a blood test. Hypokalemia may be treated by taking potassium supplements, adjusting the medicines that you take, or eating more foods that are high in potassium. If your potassium level is very low, you may need to get potassium through an IV and be monitored in the hospital. This information is not intended to replace advice given to you by your health care provider. Make sure you discuss any questions you have with your health care provider. Document Revised: 10/16/2017 Document Reviewed: 10/17/2017 Elsevier Patient Education  2022 [employer]  

## 2020-12-02 ENCOUNTER — Inpatient Hospital Stay: Payer: Medicare Other

## 2020-12-02 ENCOUNTER — Other Ambulatory Visit: Payer: Self-pay | Admitting: Oncology

## 2020-12-06 NOTE — Progress Notes (Signed)
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  INTERVAL HISTORY: Patient returns to clinic today for further evaluation and reconsideration of cycle 7 of FOLFIRI plus Avastin.  She continues have chronic weakness and fatigue.  She continues to have a poor appetite, but has not initiated oral dexamethasone.  She has no neurologic complaints.  She denies any recent fevers or illnesses.  She denies any chest pain, shortness of breath, cough, hemoptysis.  She denies any nausea, vomiting, constipation, or diarrhea.  She has no melena or hematochezia.  She has no urinary complaints.  She has no further leg pain.  Patient offers no further specific complaints today.  REVIEW OF SYSTEMS:   Review of Systems  Constitutional:  Positive for malaise/fatigue. Negative for fever and weight loss.  Respiratory: Negative.  Negative for cough, hemoptysis and shortness of breath.   Cardiovascular: Negative.  Negative for chest pain and leg swelling.  Gastrointestinal: Negative.  Negative for abdominal pain, blood in stool, constipation, diarrhea, melena, nausea and vomiting.  Genitourinary: Negative.  Negative for dysuria.  Musculoskeletal: Negative.  Negative for back pain.  Skin: Negative.  Negative for rash.  Neurological:  Positive for weakness. Negative for dizziness, focal weakness and headaches.  Psychiatric/Behavioral: Negative.  The patient is not nervous/anxious.    As per HPI. Otherwise, a complete review of systems is negative.  PAST MEDICAL HISTORY: Past Medical History:  Diagnosis Date   Arthritis     Chicken pox    Helicobacter pylori gastritis    Hyperlipidemia    Hypertension    Metastatic colon cancer in female [facility])    Obesity    Sleep apnea    Thyroid disease     PAST SURGICAL HISTORY: Past Surgical History:  Procedure Laterality Date   ABDOMINAL HYSTERECTOMY  12/2009   total   COLONOSCOPY WITH PROPOFOL N/A 08/13/2020   Procedure: COLONOSCOPY WITH PROPOFOL;  Surgeon: Jonathon Bellows, MD;  Location: [facility] ENDOSCOPY;  Service: Gastroenterology;  Laterality: N/A;   ESOPHAGOGASTRODUODENOSCOPY (EGD) WITH PROPOFOL N/A 08/13/2020   Procedure: ESOPHAGOGASTRODUODENOSCOPY (EGD) WITH PROPOFOL;  Surgeon: Jonathon Bellows, MD;  Location: [facility] ENDOSCOPY;  Service: Gastroenterology;  Laterality: N/A;   IR IMAGING GUIDED PORT INSERTION  08/27/2020   supracervical abdominal hysterectomy and bilateral salpingo--oophorectomy 01-17-2010 for fibroids  01/17/2010   TOTAL THYROIDECTOMY  1991-92   TUBAL LIGATION      FAMILY HISTORY: Family History  Problem Relation Age of Onset   Stroke Father    Diabetes Maternal Grandmother    Hypertension Maternal Grandmother    Diabetes Maternal Uncle    Cancer Maternal Aunt        Breast    ADVANCED DIRECTIVES (Y/N):  N  HEALTH MAINTENANCE: Social History   Tobacco Use   Smoking status: Never   Smokeless tobacco: Never  Vaping Use   Vaping Use: Never used  Substance Use Topics   Alcohol use: No    Alcohol/week: 0.0 standard drinks   Drug use: No     Colonoscopy:  PAP:  Bone density:  Lipid panel:  Allergies  Allergen Reactions   Erythromycin Itching    Current Outpatient Medications  Medication Sig Dispense Refill  atorvastatin (LIPITOR) 10 MG tablet TAKE 1 TABLET(10 MG) BY MOUTH DAILY 90 tablet 0   clobetasol ointment (TEMOVATE) 0.05 % Apply to affected area every night for 4 weeks, then every other day for 4 weeks and then twice a week for 4 weeks or until resolution. 30 g 5   feeding supplement (ENSURE ENLIVE / ENSURE PLUS) LIQD Take  237 mLs by mouth 3 (three) times daily between meals. 237 mL 12   levothyroxine (SYNTHROID) 150 MCG tablet Take 1 tablet (150 mcg total) by mouth daily. 90 tablet 3   XARELTO 20 MG TABS tablet TAKE 1 TABLET BY MOUTH DAILY WITH SUPPER. START ON FINAL FULL DAY OF LOVENOX INJECTIONS. 30 tablet 3   dexamethasone (DECADRON) 4 MG tablet Take 1 tablet (4 mg total) by mouth daily. (Patient not taking: Reported on 12/08/2020) 30 tablet 0   enoxaparin (LOVENOX) 100 MG/ML injection Inject 1 mL (100 mg total) into the skin every 12 (twelve) hours. (Patient not taking: No sig reported) 20 mL 0   oxyCODONE-acetaminophen (PERCOCET/ROXICET) 5-325 MG tablet Take 1 tablet by mouth every 4 (four) hours as needed for severe pain. (Patient not taking: No sig reported) 30 tablet 0   pantoprazole (PROTONIX) 40 MG tablet Take 1 tablet (40 mg total) by mouth 2 (two) times daily. (Patient not taking: Reported on 11/16/2020) 60 tablet 0   potassium chloride SA (KLOR-CON) 20 MEQ tablet TAKE 1 TABLET BY MOUTH TWICE DAILY (Patient not taking: Reported on 12/08/2020) 180 tablet 1   prochlorperazine (COMPAZINE) 10 MG tablet Take 10 mg by mouth 2 (two) times daily as needed for nausea or vomiting. (Patient not taking: No sig reported)     No current facility-administered medications for this visit.    OBJECTIVE: Vitals:   12/08/20 0917  BP: 130/83  Pulse: 85  Resp: 16  Temp: (!) 97 F (36.1 C)     Body mass index is 32.14 kg/m.    ECOG FS:0 - Asymptomatic  General: Well-developed, well-nourished, no acute distress. Eyes: Pink conjunctiva, anicteric sclera. HEENT: Normocephalic, moist mucous membranes. Lungs: No audible wheezing or coughing. Heart: Regular rate and rhythm. Abdomen: Soft, nontender, no obvious distention. Musculoskeletal: No edema, cyanosis, or clubbing. Neuro: Alert, answering all questions appropriately. Cranial nerves grossly intact. Skin: No rashes or petechiae noted. Psych: Normal affect.   LAB  RESULTS:  Lab Results  Component Value Date   NA 139 12/08/2020   K 2.8 (L) 12/08/2020   CL 97 (L) 12/08/2020   CO2 31 12/08/2020   GLUCOSE 94 12/08/2020   BUN 10 12/08/2020   CREATININE 0.83 12/08/2020   CALCIUM 8.0 (L) 12/08/2020   PROT 7.0 12/08/2020   ALBUMIN 3.0 (L) 12/08/2020   AST 26 12/08/2020   ALT 10 12/08/2020   ALKPHOS 85 12/08/2020   BILITOT 0.7 12/08/2020   GFRNONAA >60 12/08/2020   GFRAA  01/10/2010    >60        The eGFR has been calculated using the MDRD equation. This calculation has not been validated in all clinical situations. eGFR's persistently <60 mL/min signify possible Chronic Kidney Disease.    Lab Results  Component Value Date   WBC 6.8 12/08/2020   NEUTROABS 1.9 12/08/2020   HGB 12.1 12/08/2020   HCT 38.1 12/08/2020   MCV 86.0 12/08/2020   PLT 541 (H) 12/08/2020     STUDIES: CT CHEST ABDOMEN PELVIS W CONTRAST  Result Date: 11/23/2020 CLINICAL DATA:  Adenocarcinoma transverse colon. Chemotherapy initiated. Peritoneal metastasis EXAM:  CT CHEST, ABDOMEN, AND PELVIS WITH CONTRAST TECHNIQUE: Multidetector CT imaging of the chest, abdomen and pelvis was performed following the standard protocol during bolus administration of intravenous contrast. CONTRAST:  137mL OMNIPAQUE IOHEXOL 350 MG/ML SOLN COMPARISON:  CT 08/02/2020 com PET-CT 08/25/2020 FINDINGS: CT CHEST FINDINGS Cardiovascular: Port in the anterior chest wall with tip in distal SVC. No significant vascular findings. Normal heart size. No pericardial effusion. Mediastinum/Nodes: No axillary supraclavicular adenopathy no mediastinal hilar adenopathy. No pericardial effusion. Lungs/Pleura: Near complete resolution of LEFT pleural effusion. Small RIGHT effusion remains with mild basilar atelectasis. No suspicious pulmonary nodularity. Musculoskeletal: No aggressive osseous lesion. Again demonstrated rounded lesions in the subareolar LEFT breast. Lesion is not hypermetabolic on prior PET-CT scan.  CT ABDOMEN AND PELVIS FINDINGS Hepatobiliary: No focal hepatic lesion.  Gallbladder normal Pancreas: Pancreas is normal. No ductal dilatation. No pancreatic inflammation. Spleen: Normal spleen Adrenals/urinary tract: Adrenal glands and kidneys are normal. The ureters and bladder normal. Stomach/Bowel: Stomach duodenum small-bowel normal. Terminal ileum cecum normal. Appendix not identified. Luminal narrowing in the mid transverse colon is similar prior (image 75/2) however the wall thickening may be slightly decreased. However, there is decrease in the nodular lesions in the greater omentum inferior to the colon in the lesser omentum superior to the colon. Example lesion in the lesser omentum measuring 12 mm (image 71/2) decreased from 31 mm. Nodularity along the ventral peritoneal surface beneath the LEFT rectus muscle is now difficult to measure. Likewise nodularity in the greater omentum inferior to the transverse colon is less conspicuous. No evidence of bowel obstruction. Distal colon rectosigmoid colon normal. There is small fluid collection along the rectosigmoid junction measuring 2.9 cm which is decreased in volume from prior. Fluid is non organized consistent simple fluid/peritoneal fluid. Vascular/Lymphatic: Abdominal aorta is normal caliber. There is no retroperitoneal or periportal lymphadenopathy. No pelvic lymphadenopathy. Reproductive: Uterus normal Other: No free fluid. Musculoskeletal: No aggressive osseous lesion. IMPRESSION: Chest Impression: 1. No evidence of thoracic metastasis. 2. Resolution of LEFT pleural effusion. Abdomen / Pelvis Impression: 1. Measurable decrease in omental nodularity in the lesser omentum and greater omentum. Decreased nodularity along the ventral peritoneal surface. No new peritoneal disease. 2. Persistent luminal narrowing through the mid transverse colon. 3. No evidence of liver metastasis. 4. No evidence of abdominal lymphadenopathy. 5. Decrease in intraperitoneal  free fluid. Electronically Signed   By: Suzy Bouchard M.D.   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  PLAN:    1.  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  Pretreatment CEA was 313, which is now improved to 21.6.  Patient completed cycle 2 of FOLFOX plus Avastin, but then had a delayed reaction possibly secondary to oxaliplatin.  Oxaliplatin has been discontinued.  Proceed with cycle 7 of FOLFIRI plus Avastin today.  Will add Ziextenzo with pump off for the remainder of the cycles.  Return to clinic in 1 week for further evaluation and reconsideration of cycle 7.   2.  Abdominal pain: Resolved  Continue Percocet as needed. 3.  Renal insufficiency: Resolved. 4.  Hypokalemia: Potassium remains decreased, but patient did not initiate oral potassium supplementation.  Proceed with 40 mEq of IV potassium. 5.  Nausea and vomiting: Resolved.  Continue antiemetics as prescribed. 6.  Poor  appetite: Chronic and unchanged.  Patient did not initiate oral dexamethasone as prescribed.  She declined additional evaluation from dietary. 7.  Neutropenia: Resolved.  Proceed with treatment and add Ziextenzo for subsequent treatments. 8.  PE/DVT: Patient noted to have new onset symptoms in her right lower extremity which revealed a large DVT while taking Eliquis. 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  9. Anemia: Resolved.  Patient expressed understanding and was in agreement with this plan. She also understands that She can call clinic at any time with any questions, concerns, or complaints.   Cancer Staging Adenocarcinoma of transverse colon  [facility]) Staging form: Colon and Rectum - Neuroendocine Tumors, AJCC 8th Edition - Clinical stage from 08/18/2020: Stage IV (cTX, cNX, pM1b) - Signed by Lloyd Huger, MD on 08/18/2020 Stage prefix: Initial diagnosis   Lloyd Huger, MD   12/10/2020 6:58 AM

## 2020-12-08 ENCOUNTER — Other Ambulatory Visit: Payer: Self-pay

## 2020-12-08 ENCOUNTER — Inpatient Hospital Stay: Payer: Medicare Other

## 2020-12-08 ENCOUNTER — Encounter: Payer: Self-pay | Admitting: Oncology

## 2020-12-08 ENCOUNTER — Inpatient Hospital Stay (HOSPITAL_BASED_OUTPATIENT_CLINIC_OR_DEPARTMENT_OTHER): Payer: Medicare Other | Admitting: Oncology

## 2020-12-08 VITALS — BP 130/83 | HR 85 | Temp 97.0°F | Resp 16 | Wt 199.1 lb

## 2020-12-08 DIAGNOSIS — C184 Malignant neoplasm of transverse colon: Secondary | ICD-10-CM

## 2020-12-08 DIAGNOSIS — Z95828 Presence of other vascular implants and grafts: Secondary | ICD-10-CM

## 2020-12-08 DIAGNOSIS — E876 Hypokalemia: Secondary | ICD-10-CM

## 2020-12-08 DIAGNOSIS — Z5111 Encounter for antineoplastic chemotherapy: Secondary | ICD-10-CM | POA: Diagnosis not present

## 2020-12-08 LAB — COMPREHENSIVE METABOLIC PANEL
ALT: 10 U/L (ref 0–44)
AST: 26 U/L (ref 15–41)
Albumin: 3 g/dL — ABNORMAL LOW (ref 3.5–5.0)
Alkaline Phosphatase: 85 U/L (ref 38–126)
Anion gap: 11 (ref 5–15)
BUN: 10 mg/dL (ref 8–23)
CO2: 31 mmol/L (ref 22–32)
Calcium: 8 mg/dL — ABNORMAL LOW (ref 8.9–10.3)
Chloride: 97 mmol/L — ABNORMAL LOW (ref 98–111)
Creatinine, Ser: 0.83 mg/dL (ref 0.44–1.00)
GFR, Estimated: >60 mL/min (ref >60)
Glucose, Bld: 94 mg/dL (ref 70–99)
Potassium: 2.8 mmol/L — ABNORMAL LOW (ref 3.5–5.1)
Sodium: 139 mmol/L (ref 135–145)
Total Bilirubin: 0.7 mg/dL (ref 0.3–1.2)
Total Protein: 7 g/dL (ref 6.5–8.1)

## 2020-12-08 LAB — URINALYSIS, DIPSTICK ONLY
Glucose, UA: NEGATIVE mg/dL
Hgb urine dipstick: NEGATIVE
Nitrite: NEGATIVE
Specific Gravity, Urine: 1.015 (ref 1.005–1.030)
pH: 6 (ref 5.0–8.0)

## 2020-12-08 LAB — CBC WITH DIFFERENTIAL/PLATELET
Abs Immature Granulocytes: 0.07 10*3/uL (ref 0.00–0.07)
Basophils Absolute: 0.1 10*3/uL (ref 0.0–0.1)
Basophils Relative: 1 %
Eosinophils Absolute: 0.2 10*3/uL (ref 0.0–0.5)
Eosinophils Relative: 3 %
HCT: 38.1 % (ref 36.0–46.0)
Hemoglobin: 12.1 g/dL (ref 12.0–15.0)
Immature Granulocytes: 1 %
Lymphocytes Relative: 50 %
Lymphs Abs: 3.4 10*3/uL (ref 0.7–4.0)
MCH: 27.3 pg (ref 26.0–34.0)
MCHC: 31.8 g/dL (ref 30.0–36.0)
MCV: 86 fL (ref 80.0–100.0)
Monocytes Absolute: 1.2 10*3/uL — ABNORMAL HIGH (ref 0.1–1.0)
Monocytes Relative: 17 %
Neutro Abs: 1.9 10*3/uL (ref 1.7–7.7)
Neutrophils Relative %: 28 %
Platelets: 541 10*3/uL — ABNORMAL HIGH (ref 150–400)
RBC: 4.43 MIL/uL (ref 3.87–5.11)
RDW: 24.4 % — ABNORMAL HIGH (ref 11.5–15.5)
WBC: 6.8 10*3/uL (ref 4.0–10.5)
nRBC: 0 % (ref 0.0–0.2)

## 2020-12-08 MED ORDER — SODIUM CHLORIDE 0.9 % IV SOLN
360.0000 mg | Freq: Once | INTRAVENOUS | Status: AC
  Administered 2020-12-08: 360 mg via INTRAVENOUS
  Filled 2020-12-08: qty 15

## 2020-12-08 MED ORDER — PALONOSETRON HCL INJECTION 0.25 MG/5ML
0.2500 mg | Freq: Once | INTRAVENOUS | Status: AC
  Administered 2020-12-08: 0.25 mg via INTRAVENOUS
  Filled 2020-12-08: qty 5

## 2020-12-08 MED ORDER — SODIUM CHLORIDE 0.9 % IV SOLN
4800.0000 mg | INTRAVENOUS | Status: DC
  Administered 2020-12-08: 4800 mg via INTRAVENOUS
  Filled 2020-12-08: qty 96

## 2020-12-08 MED ORDER — SODIUM CHLORIDE 0.9 % IV SOLN: Freq: Once | INTRAVENOUS | Status: DC

## 2020-12-08 MED ORDER — FLUOROURACIL CHEMO INJECTION 2.5 GM/50ML
800.0000 mg | Freq: Once | INTRAVENOUS | Status: AC
  Administered 2020-12-08: 800 mg via INTRAVENOUS
  Filled 2020-12-08: qty 16

## 2020-12-08 MED ORDER — SODIUM CHLORIDE 0.9% FLUSH
10.0000 mL | Freq: Once | INTRAVENOUS | Status: DC
  Filled 2020-12-08: qty 10

## 2020-12-08 MED ORDER — SODIUM CHLORIDE 0.9 % IV SOLN
450.0000 mg | Freq: Once | INTRAVENOUS | Status: AC
  Administered 2020-12-08: 450 mg via INTRAVENOUS
  Filled 2020-12-08: qty 16

## 2020-12-08 MED ORDER — ATROPINE SULFATE 1 MG/ML IJ SOLN
0.5000 mg | Freq: Once | INTRAMUSCULAR | Status: AC | PRN
  Administered 2020-12-08: 0.5 mg via INTRAVENOUS
  Filled 2020-12-08: qty 1

## 2020-12-08 MED ORDER — SODIUM CHLORIDE 0.9 % IV SOLN
10.0000 mg | Freq: Once | INTRAVENOUS | Status: AC
  Administered 2020-12-08: 10 mg via INTRAVENOUS
  Filled 2020-12-08: qty 10

## 2020-12-08 MED ORDER — SODIUM CHLORIDE 0.9 % IV SOLN
Freq: Once | INTRAVENOUS | Status: AC
  Filled 2020-12-08: qty 250

## 2020-12-08 MED ORDER — SODIUM CHLORIDE 0.9 % IV SOLN
800.0000 mg | Freq: Once | INTRAVENOUS | Status: AC
  Administered 2020-12-08: 800 mg via INTRAVENOUS
  Filled 2020-12-08: qty 5

## 2020-12-08 MED ORDER — SODIUM CHLORIDE 0.9 % IV SOLN
40.0000 meq | Freq: Once | INTRAVENOUS | Status: AC
  Administered 2020-12-08: 40 meq via INTRAVENOUS
  Filled 2020-12-08: qty 20

## 2020-12-08 NOTE — Progress Notes (Signed)
Patient had difficulty swallowing the potassium pills so she has not taken.  Did pick up the prescription for Dexamethasone to help increase appetite but has not started yet.

## 2020-12-08 NOTE — Progress Notes (Signed)
Per MD ok to adjust doses for new BSA

## 2020-12-08 NOTE — Patient Instructions (Signed)
[facility]  Discharge Instructions: Thank you for choosing [facility] to provide your oncology and hematology care.  If you have a lab appointment with the [facility], please go directly to the [facility] and check in at the registration area.  Wear comfortable clothing and clothing appropriate for easy access to any Portacath or PICC line.   We strive to give you quality time with your provider. You may need to reschedule your appointment if you arrive late (15 or more minutes).  Arriving late affects you and other patients whose appointments are after yours.  Also, if you miss three or more appointments without notifying the office, you may be dismissed from the clinic at the provider's discretion.      For prescription refill requests, have your pharmacy contact our office and allow 72 hours for refills to be completed.    Today you received the following chemotherapy and/or immunotherapy agents Zirabev, irinotecan, Adrucil.    To help prevent nausea and vomiting after your treatment, we encourage you to take your nausea medication as directed.  BELOW ARE SYMPTOMS THAT SHOULD BE REPORTED IMMEDIATELY: *FEVER GREATER THAN 100.4 F (38 C) OR HIGHER *CHILLS OR SWEATING *NAUSEA AND VOMITING THAT IS NOT CONTROLLED WITH YOUR NAUSEA MEDICATION *UNUSUAL SHORTNESS OF BREATH *UNUSUAL BRUISING OR BLEEDING *URINARY PROBLEMS (pain or burning when urinating, or frequent urination) *BOWEL PROBLEMS (unusual diarrhea, constipation, pain near the anus) TENDERNESS IN MOUTH AND THROAT WITH OR WITHOUT PRESENCE OF ULCERS (sore throat, sores in mouth, or a toothache) UNUSUAL RASH, SWELLING OR PAIN  UNUSUAL VAGINAL DISCHARGE OR ITCHING   Items with * indicate a potential emergency and should be followed up as soon as possible or go to the Emergency Department if any problems should occur.  Please show the CHEMOTHERAPY ALERT CARD or IMMUNOTHERAPY ALERT  CARD at check-in to the Emergency Department and triage nurse.  Should you have questions after your visit or need to cancel or reschedule your appointment, please contact [facility]  [phone] and follow the prompts.  Office hours are 8:00 a.m. to 4:30 p.m. Monday - Friday. Please note that voicemails left after 4:00 p.m. may not be returned until the following business day.  We are closed weekends and major holidays. You have access to a nurse at all times for urgent questions. Please call the main number to the clinic [phone] and follow the prompts.  For any non-urgent questions, you may also contact your provider using MyChart. We now offer e-Visits for anyone 88 and older to request care online for non-urgent symptoms. For details visit [email or web address].   Also download the MyChart app! Go to the app store, search "MyChart", open the app, select [facility], and log in with your MyChart username and password.  Due to Covid, a mask is required upon entering the hospital/clinic. If you do not have a mask, one will be given to you upon arrival. For doctor visits, patients may have 1 support person aged 25 or older with them. For treatment visits, patients cannot have anyone with them due to current Covid guidelines and our immunocompromised population.

## 2020-12-08 NOTE — Progress Notes (Signed)
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  Patient receiving folfiri and zirabev.   Met with patient during infusion.  Patient reports that appetite is the same.  Has not started dexamethasone.  Still only able to tolerate [employer] milkshake vanilla and strawberry and mandarin oranges.  Drinking 1 ensure/boost daily and says she can't drink anymore than that.    Medications: dexamethasone  Labs: reviewed  Anthropometrics:   Weight 199 lb  201 lb on 9/13 208 lb 8/30 216 lb 8/16 226 lb 7/19 225 lb on 7/12 263 lb on 2/1   NUTRITION DIAGNOSIS: Inadequate oral intake continues   INTERVENTION:  Strongly encouraged patient to start appetite stimulant.  She verbalized that she would    MONITORING, EVALUATION, GOAL: weight trends, intake   NEXT VISIT: Wednesday, Oct 5 during infusion  Kaytelyn Glore B. Zenia Resides, [place], [place] Registered Dietitian [phone] (mobile)

## 2020-12-10 ENCOUNTER — Inpatient Hospital Stay: Payer: Medicare Other

## 2020-12-10 ENCOUNTER — Encounter: Payer: Self-pay | Admitting: Oncology

## 2020-12-10 VITALS — BP 129/84 | HR 79 | Temp 97.0°F | Resp 17

## 2020-12-10 DIAGNOSIS — C184 Malignant neoplasm of transverse colon: Secondary | ICD-10-CM

## 2020-12-10 DIAGNOSIS — Z5111 Encounter for antineoplastic chemotherapy: Secondary | ICD-10-CM | POA: Diagnosis not present

## 2020-12-10 MED ORDER — HEPARIN SOD (PORK) LOCK FLUSH 100 UNIT/ML IV SOLN
INTRAVENOUS | Status: AC
  Administered 2020-12-10: 500 [IU]
  Filled 2020-12-10: qty 5

## 2020-12-10 MED ORDER — HEPARIN SOD (PORK) LOCK FLUSH 100 UNIT/ML IV SOLN
500.0000 [IU] | Freq: Once | INTRAVENOUS | Status: AC | PRN
  Filled 2020-12-10: qty 5

## 2020-12-10 MED ORDER — PEGFILGRASTIM-BMEZ 6 MG/0.6ML ~~LOC~~ SOSY
6.0000 mg | PREFILLED_SYRINGE | Freq: Once | SUBCUTANEOUS | Status: AC
  Administered 2020-12-10: 6 mg via SUBCUTANEOUS
  Filled 2020-12-10: qty 0.6

## 2020-12-10 MED ORDER — SODIUM CHLORIDE 0.9% FLUSH
10.0000 mL | INTRAVENOUS | Status: DC | PRN
  Filled 2020-12-10: qty 10

## 2020-12-10 NOTE — Patient Instructions (Signed)

## 2020-12-20 NOTE — Progress Notes (Signed)
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  INTERVAL HISTORY: Patient returns to clinic today for further evaluation and consideration of cycle 8 of FOLFIRI plus Avastin.  She continues to have a poor appetite, but only took 1 day of prescribed dexamethasone.  She continues to have weakness and fatigue.  She has no neurologic complaints.  She denies any recent fevers or illnesses.  She denies any chest pain, shortness of breath, cough, hemoptysis.  She denies any nausea, vomiting, constipation, or diarrhea.  She has no melena or hematochezia.  She has no urinary complaints.  She has no further leg pain.  Patient offers no further specific complaints today.  REVIEW OF SYSTEMS:   Review of Systems  Constitutional:  Positive for malaise/fatigue. Negative for fever and weight loss.  Respiratory: Negative.  Negative for cough, hemoptysis and shortness of breath.   Cardiovascular: Negative.  Negative for chest pain and leg swelling.  Gastrointestinal: Negative.  Negative for abdominal pain, blood in stool, constipation, diarrhea, melena, nausea and vomiting.  Genitourinary: Negative.  Negative for dysuria.  Musculoskeletal: Negative.  Negative for back pain.  Skin: Negative.  Negative for rash.  Neurological:  Positive for weakness. Negative for dizziness, focal weakness and headaches.  Psychiatric/Behavioral: Negative.  The patient is not nervous/anxious.    As per HPI. Otherwise, a complete review of systems is negative.  PAST MEDICAL HISTORY: Past Medical History:  Diagnosis Date   Arthritis     Chicken pox    Helicobacter pylori gastritis    Hyperlipidemia    Hypertension    Metastatic colon cancer in female [facility])    Obesity    Sleep apnea    Thyroid disease     PAST SURGICAL HISTORY: Past Surgical History:  Procedure Laterality Date   ABDOMINAL HYSTERECTOMY  12/2009   total   COLONOSCOPY WITH PROPOFOL N/A 08/13/2020   Procedure: COLONOSCOPY WITH PROPOFOL;  Surgeon: Jonathon Bellows, MD;  Location: [facility] ENDOSCOPY;  Service: Gastroenterology;  Laterality: N/A;   ESOPHAGOGASTRODUODENOSCOPY (EGD) WITH PROPOFOL N/A 08/13/2020   Procedure: ESOPHAGOGASTRODUODENOSCOPY (EGD) WITH PROPOFOL;  Surgeon: Jonathon Bellows, MD;  Location: [facility] ENDOSCOPY;  Service: Gastroenterology;  Laterality: N/A;   IR IMAGING GUIDED PORT INSERTION  08/27/2020   supracervical abdominal hysterectomy and bilateral salpingo--oophorectomy 01-17-2010 for fibroids  01/17/2010   TOTAL THYROIDECTOMY  1991-92   TUBAL LIGATION      FAMILY HISTORY: Family History  Problem Relation Age of Onset   Stroke Father    Diabetes Maternal Grandmother    Hypertension Maternal Grandmother    Diabetes Maternal Uncle    Cancer Maternal Aunt        Breast    ADVANCED DIRECTIVES (Y/N):  N  HEALTH MAINTENANCE: Social History   Tobacco Use   Smoking status: Never   Smokeless tobacco: Never  Vaping Use   Vaping Use: Never used  Substance Use Topics   Alcohol use: No    Alcohol/week: 0.0 standard drinks   Drug use: No     Colonoscopy:  PAP:  Bone density:  Lipid panel:  Allergies  Allergen Reactions   Erythromycin Itching    Current Outpatient Medications  Medication Sig Dispense  Refill   atorvastatin (LIPITOR) 10 MG tablet TAKE 1 TABLET(10 MG) BY MOUTH DAILY 90 tablet 0   dexamethasone (DECADRON) 4 MG tablet Take 1 tablet (4 mg total) by mouth daily. 30 tablet 0   feeding supplement (ENSURE ENLIVE / ENSURE PLUS) LIQD Take 237 mLs by mouth 3 (three) times daily between meals. 237 mL 12   levothyroxine  (SYNTHROID) 150 MCG tablet Take 1 tablet (150 mcg total) by mouth daily. 90 tablet 3   potassium chloride SA (KLOR-CON) 20 MEQ tablet TAKE 1 TABLET BY MOUTH TWICE DAILY 180 tablet 1   prochlorperazine (COMPAZINE) 10 MG tablet Take 10 mg by mouth 2 (two) times daily as needed for nausea or vomiting.     sulfamethoxazole-trimethoprim (BACTRIM DS) 800-160 MG tablet Take 1 tablet by mouth 2 (two) times daily for 5 days. 10 tablet 0   XARELTO 20 MG TABS tablet TAKE 1 TABLET BY MOUTH DAILY WITH SUPPER. START ON FINAL FULL DAY OF LOVENOX INJECTIONS. 30 tablet 3   clobetasol ointment (TEMOVATE) 0.05 % Apply to affected area every night for 4 weeks, then every other day for 4 weeks and then twice a week for 4 weeks or until resolution. (Patient not taking: Reported on 12/22/2020) 30 g 5   enoxaparin (LOVENOX) 100 MG/ML injection Inject 1 mL (100 mg total) into the skin every 12 (twelve) hours. (Patient not taking: No sig reported) 20 mL 0   oxyCODONE-acetaminophen (PERCOCET/ROXICET) 5-325 MG tablet Take 1 tablet by mouth every 4 (four) hours as needed for severe pain. (Patient not taking: No sig reported) 30 tablet 0   pantoprazole (PROTONIX) 40 MG tablet Take 1 tablet (40 mg total) by mouth 2 (two) times daily. (Patient not taking: Reported on 11/16/2020) 60 tablet 0   No current facility-administered medications for this visit.   Facility-Administered Medications Ordered in Other Visits  Medication Dose Route Frequency Provider Last Rate Last Admin   fluorouracil (ADRUCIL) 4,800 mg in sodium chloride 0.9 % 54 mL chemo infusion  2,400 mg/m2 (Order-Specific) Intravenous 1 day or 1 dose Lloyd Huger, MD   4,800 mg at 12/22/20 1400   heparin lock flush 100 unit/mL  500 Units Intravenous Once Lloyd Huger, MD        OBJECTIVE: Vitals:   12/22/20 0908  BP: 120/76  Pulse: 82  Resp: 16  Temp: (!) 97.1 F (36.2 C)  SpO2: 100%     Body mass index is 30.84 kg/m.    ECOG FS:0 -  Asymptomatic  General: Well-developed, well-nourished, no acute distress. Eyes: Pink conjunctiva, anicteric sclera. HEENT: Normocephalic, moist mucous membranes. Lungs: No audible wheezing or coughing. Heart: Regular rate and rhythm. Abdomen: Soft, nontender, no obvious distention. Musculoskeletal: No edema, cyanosis, or clubbing. Neuro: Alert, answering all questions appropriately. Cranial nerves grossly intact. Skin: No rashes or petechiae noted. Psych: Normal affect.  LAB RESULTS:  Lab Results  Component Value Date   NA 136 12/22/2020   K 2.6 (LL) 12/22/2020   CL 93 (L) 12/22/2020   CO2 26 12/22/2020   GLUCOSE 63 (L) 12/22/2020   BUN 7 (L) 12/22/2020   CREATININE 0.85 12/22/2020   CALCIUM 8.4 (L) 12/22/2020   PROT 6.9 12/22/2020   ALBUMIN 3.1 (L) 12/22/2020   AST 29 12/22/2020   ALT 17 12/22/2020   ALKPHOS 102 12/22/2020   BILITOT 1.4 (H) 12/22/2020   GFRNONAA >60 12/22/2020   GFRAA  01/10/2010    >60        The  eGFR has been calculated using the MDRD equation. This calculation has not been validated in all clinical situations. eGFR's persistently <60 mL/min signify possible Chronic Kidney Disease.    Lab Results  Component Value Date   WBC 7.8 12/22/2020   NEUTROABS 3.5 12/22/2020   HGB 12.7 12/22/2020   HCT 38.2 12/22/2020   MCV 88.6 12/22/2020   PLT 213 12/22/2020     STUDIES: CT CHEST ABDOMEN PELVIS W CONTRAST  Result Date: 11/23/2020 CLINICAL DATA:  Adenocarcinoma transverse colon. Chemotherapy initiated. Peritoneal metastasis EXAM: CT CHEST, ABDOMEN, AND PELVIS WITH CONTRAST TECHNIQUE: Multidetector CT imaging of the chest, abdomen and pelvis was performed following the standard protocol during bolus administration of intravenous contrast. CONTRAST:  129mL OMNIPAQUE IOHEXOL 350 MG/ML SOLN COMPARISON:  CT 08/02/2020 com PET-CT 08/25/2020 FINDINGS: CT CHEST FINDINGS Cardiovascular: Port in the anterior chest wall with tip in distal SVC. No significant  vascular findings. Normal heart size. No pericardial effusion. Mediastinum/Nodes: No axillary supraclavicular adenopathy no mediastinal hilar adenopathy. No pericardial effusion. Lungs/Pleura: Near complete resolution of LEFT pleural effusion. Small RIGHT effusion remains with mild basilar atelectasis. No suspicious pulmonary nodularity. Musculoskeletal: No aggressive osseous lesion. Again demonstrated rounded lesions in the subareolar LEFT breast. Lesion is not hypermetabolic on prior PET-CT scan. CT ABDOMEN AND PELVIS FINDINGS Hepatobiliary: No focal hepatic lesion.  Gallbladder normal Pancreas: Pancreas is normal. No ductal dilatation. No pancreatic inflammation. Spleen: Normal spleen Adrenals/urinary tract: Adrenal glands and kidneys are normal. The ureters and bladder normal. Stomach/Bowel: Stomach duodenum small-bowel normal. Terminal ileum cecum normal. Appendix not identified. Luminal narrowing in the mid transverse colon is similar prior (image 75/2) however the wall thickening may be slightly decreased. However, there is decrease in the nodular lesions in the greater omentum inferior to the colon in the lesser omentum superior to the colon. Example lesion in the lesser omentum measuring 12 mm (image 71/2) decreased from 31 mm. Nodularity along the ventral peritoneal surface beneath the LEFT rectus muscle is now difficult to measure. Likewise nodularity in the greater omentum inferior to the transverse colon is less conspicuous. No evidence of bowel obstruction. Distal colon rectosigmoid colon normal. There is small fluid collection along the rectosigmoid junction measuring 2.9 cm which is decreased in volume from prior. Fluid is non organized consistent simple fluid/peritoneal fluid. Vascular/Lymphatic: Abdominal aorta is normal caliber. There is no retroperitoneal or periportal lymphadenopathy. No pelvic lymphadenopathy. Reproductive: Uterus normal Other: No free fluid. Musculoskeletal: No aggressive  osseous lesion. IMPRESSION: Chest Impression: 1. No evidence of thoracic metastasis. 2. Resolution of LEFT pleural effusion. Abdomen / Pelvis Impression: 1. Measurable decrease in omental nodularity in the lesser omentum and greater omentum. Decreased nodularity along the ventral peritoneal surface. No new peritoneal disease. 2. Persistent luminal narrowing through the mid transverse colon. 3. No evidence of liver metastasis. 4. No evidence of abdominal lymphadenopathy. 5. Decrease in intraperitoneal free fluid. Electronically Signed   By: Suzy Bouchard M.D.   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  PLAN:    1.  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  Pretreatment CEA was 313, which is now improved to 21.6.  Patient completed cycle 2 of FOLFOX plus Avastin, but then had a delayed reaction possibly secondary to oxaliplatin.  Oxaliplatin has been  discontinued.  Proceed with cycle 8 of FOLFIRI plus Avastin today.  Continue Ziextenzo with pump off for the remainder of the cycles given her history of neutropenia.  Return to clinic in 2 days for pump removal and IV potassium and then in 2 weeks for further evaluation and consideration of cycle return to clinic in 1 week for further evaluation and reconsideration of cycle 9.   2.  Abdominal pain: Resolved  Continue Percocet as needed. 3.  Renal insufficiency: Resolved. 4.  Hypokalemia: Remains significantly reduced.  Proceed with 40 meq IC potassium today and an addition 40 meq on Friday with pump removal. Continue oral potassium supplementation.  5.  Nausea and vomiting: Resolved.  Continue antiemetics as prescribed. 6.  Poor appetite: Chronic and unchanged.  Patient did not  initiate oral dexamethasone as prescribed.  She declined additional evaluation from dietary. 7.  Neutropenia: Resolved.  Proceed with treatment and continue Ziextenzo with pump removal for subsequent treatments. 8.  PE/DVT: Patient noted to have new onset symptoms in her right lower extremity which revealed a large DVT while taking Eliquis. 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  9. Anemia: Resolved. 10. Poor appetite: Encouraged compliance with dexamethasone.  Patient expressed understanding and was in agreement with this plan. She also understands that She can call clinic at any time with any questions, concerns, or complaints.   Cancer Staging Adenocarcinoma of transverse colon [facility]) Staging form: Colon and Rectum - Neuroendocine Tumors, AJCC 8th Edition - Clinical stage from 08/18/2020: Stage IV (cTX, cNX, pM1b) - Signed by Lloyd Huger, MD on 08/18/2020 Stage prefix: Initial diagnosis   Lloyd Huger, MD   12/22/2020 2:17 PM

## 2020-12-22 ENCOUNTER — Inpatient Hospital Stay: Payer: Medicare Other

## 2020-12-22 ENCOUNTER — Inpatient Hospital Stay (HOSPITAL_BASED_OUTPATIENT_CLINIC_OR_DEPARTMENT_OTHER): Payer: Medicare Other | Admitting: Oncology

## 2020-12-22 ENCOUNTER — Telehealth: Payer: Self-pay | Admitting: *Deleted

## 2020-12-22 ENCOUNTER — Inpatient Hospital Stay: Payer: Medicare Other | Attending: Oncology

## 2020-12-22 ENCOUNTER — Other Ambulatory Visit: Payer: Self-pay

## 2020-12-22 VITALS — BP 120/76 | HR 82 | Temp 97.1°F | Resp 16 | Wt 191.1 lb

## 2020-12-22 DIAGNOSIS — Z5111 Encounter for antineoplastic chemotherapy: Secondary | ICD-10-CM | POA: Insufficient documentation

## 2020-12-22 DIAGNOSIS — E876 Hypokalemia: Secondary | ICD-10-CM | POA: Diagnosis not present

## 2020-12-22 DIAGNOSIS — Z86718 Personal history of other venous thrombosis and embolism: Secondary | ICD-10-CM | POA: Diagnosis not present

## 2020-12-22 DIAGNOSIS — C184 Malignant neoplasm of transverse colon: Secondary | ICD-10-CM | POA: Diagnosis present

## 2020-12-22 DIAGNOSIS — Z7901 Long term (current) use of anticoagulants: Secondary | ICD-10-CM | POA: Diagnosis not present

## 2020-12-22 DIAGNOSIS — C786 Secondary malignant neoplasm of retroperitoneum and peritoneum: Secondary | ICD-10-CM | POA: Diagnosis not present

## 2020-12-22 DIAGNOSIS — D649 Anemia, unspecified: Secondary | ICD-10-CM | POA: Diagnosis not present

## 2020-12-22 DIAGNOSIS — Z79899 Other long term (current) drug therapy: Secondary | ICD-10-CM | POA: Insufficient documentation

## 2020-12-22 LAB — COMPREHENSIVE METABOLIC PANEL
ALT: 17 U/L (ref 0–44)
AST: 29 U/L (ref 15–41)
Albumin: 3.1 g/dL — ABNORMAL LOW (ref 3.5–5.0)
Alkaline Phosphatase: 102 U/L (ref 38–126)
Anion gap: 17 — ABNORMAL HIGH (ref 5–15)
BUN: 7 mg/dL — ABNORMAL LOW (ref 8–23)
CO2: 26 mmol/L (ref 22–32)
Calcium: 8.4 mg/dL — ABNORMAL LOW (ref 8.9–10.3)
Chloride: 93 mmol/L — ABNORMAL LOW (ref 98–111)
Creatinine, Ser: 0.85 mg/dL (ref 0.44–1.00)
GFR, Estimated: >60 mL/min (ref >60)
Glucose, Bld: 63 mg/dL — ABNORMAL LOW (ref 70–99)
Potassium: 2.6 mmol/L — CL (ref 3.5–5.1)
Sodium: 136 mmol/L (ref 135–145)
Total Bilirubin: 1.4 mg/dL — ABNORMAL HIGH (ref 0.3–1.2)
Total Protein: 6.9 g/dL (ref 6.5–8.1)

## 2020-12-22 LAB — CBC WITH DIFFERENTIAL/PLATELET
Abs Immature Granulocytes: 0.12 10*3/uL — ABNORMAL HIGH (ref 0.00–0.07)
Basophils Absolute: 0.1 10*3/uL (ref 0.0–0.1)
Basophils Relative: 1 %
Eosinophils Absolute: 0.4 10*3/uL (ref 0.0–0.5)
Eosinophils Relative: 5 %
HCT: 38.2 % (ref 36.0–46.0)
Hemoglobin: 12.7 g/dL (ref 12.0–15.0)
Immature Granulocytes: 2 %
Lymphocytes Relative: 36 %
Lymphs Abs: 2.8 10*3/uL (ref 0.7–4.0)
MCH: 29.5 pg (ref 26.0–34.0)
MCHC: 33.2 g/dL (ref 30.0–36.0)
MCV: 88.6 fL (ref 80.0–100.0)
Monocytes Absolute: 1 10*3/uL (ref 0.1–1.0)
Monocytes Relative: 12 %
Neutro Abs: 3.5 10*3/uL (ref 1.7–7.7)
Neutrophils Relative %: 44 %
Platelets: 213 10*3/uL (ref 150–400)
RBC: 4.31 MIL/uL (ref 3.87–5.11)
RDW: 23.3 % — ABNORMAL HIGH (ref 11.5–15.5)
Smear Review: NORMAL
WBC: 7.8 10*3/uL (ref 4.0–10.5)
nRBC: 0.5 % — ABNORMAL HIGH (ref 0.0–0.2)

## 2020-12-22 LAB — URINALYSIS, DIPSTICK ONLY
Glucose, UA: NEGATIVE mg/dL
Ketones, ur: 80 mg/dL — AB
Nitrite: NEGATIVE
Protein, ur: 100 mg/dL — AB
Specific Gravity, Urine: 1.024 (ref 1.005–1.030)
pH: 5 (ref 5.0–8.0)

## 2020-12-22 MED ORDER — ATROPINE SULFATE 1 MG/ML IJ SOLN
0.5000 mg | Freq: Once | INTRAMUSCULAR | Status: AC | PRN
  Administered 2020-12-22: 0.5 mg via INTRAVENOUS
  Filled 2020-12-22: qty 1

## 2020-12-22 MED ORDER — SODIUM CHLORIDE 0.9 % IV SOLN: 180.0000 mg/m2 | Freq: Once | INTRAVENOUS | Status: DC

## 2020-12-22 MED ORDER — SODIUM CHLORIDE 0.9 % IV SOLN
180.0000 mg/m2 | Freq: Once | INTRAVENOUS | Status: AC
  Administered 2020-12-22: 360 mg via INTRAVENOUS
  Filled 2020-12-22: qty 15

## 2020-12-22 MED ORDER — SULFAMETHOXAZOLE-TRIMETHOPRIM 800-160 MG PO TABS: 1.0000 | ORAL_TABLET | Freq: Two times a day (BID) | ORAL | 0 refills | Status: AC

## 2020-12-22 MED ORDER — SODIUM CHLORIDE 0.9% FLUSH
10.0000 mL | Freq: Once | INTRAVENOUS | Status: AC
  Administered 2020-12-22: 10 mL via INTRAVENOUS
  Filled 2020-12-22: qty 10

## 2020-12-22 MED ORDER — SODIUM CHLORIDE 0.9 % IV SOLN: 2400.0000 mg/m2 | INTRAVENOUS | Status: DC

## 2020-12-22 MED ORDER — PALONOSETRON HCL INJECTION 0.25 MG/5ML
0.2500 mg | Freq: Once | INTRAVENOUS | Status: AC
  Administered 2020-12-22: 0.25 mg via INTRAVENOUS
  Filled 2020-12-22: qty 5

## 2020-12-22 MED ORDER — SODIUM CHLORIDE 0.9 % IV SOLN
5.0000 mg/kg | Freq: Once | INTRAVENOUS | Status: DC
  Filled 2020-12-22: qty 20

## 2020-12-22 MED ORDER — SODIUM CHLORIDE 0.9 % IV SOLN: 400.0000 mg/m2 | Freq: Once | INTRAVENOUS | Status: DC

## 2020-12-22 MED ORDER — FLUOROURACIL CHEMO INJECTION 2.5 GM/50ML: 400.0000 mg/m2 | Freq: Once | INTRAVENOUS | Status: DC

## 2020-12-22 MED ORDER — SODIUM CHLORIDE 0.9 % IV SOLN
398.0000 mg/m2 | Freq: Once | INTRAVENOUS | Status: AC
  Administered 2020-12-22: 800 mg via INTRAVENOUS
  Filled 2020-12-22: qty 40

## 2020-12-22 MED ORDER — FLUOROURACIL CHEMO INJECTION 2.5 GM/50ML
400.0000 mg/m2 | Freq: Once | INTRAVENOUS | Status: AC
  Administered 2020-12-22: 800 mg via INTRAVENOUS
  Filled 2020-12-22: qty 16

## 2020-12-22 MED ORDER — SODIUM CHLORIDE 0.9 % IV SOLN
10.0000 mg | Freq: Once | INTRAVENOUS | Status: AC
  Administered 2020-12-22: 10 mg via INTRAVENOUS
  Filled 2020-12-22: qty 10

## 2020-12-22 MED ORDER — SODIUM CHLORIDE 0.9 % IV SOLN
Freq: Once | INTRAVENOUS | Status: AC
  Filled 2020-12-22: qty 20

## 2020-12-22 MED ORDER — HEPARIN SOD (PORK) LOCK FLUSH 100 UNIT/ML IV SOLN
500.0000 [IU] | Freq: Once | INTRAVENOUS | Status: DC
  Filled 2020-12-22: qty 5

## 2020-12-22 MED ORDER — SODIUM CHLORIDE 0.9 % IV SOLN
5.0000 mg/kg | Freq: Once | INTRAVENOUS | Status: AC
  Administered 2020-12-22: 400 mg via INTRAVENOUS
  Filled 2020-12-22: qty 16

## 2020-12-22 MED ORDER — SODIUM CHLORIDE 0.9 % IV SOLN
Freq: Once | INTRAVENOUS | Status: AC
  Filled 2020-12-22: qty 250

## 2020-12-22 MED ORDER — SODIUM CHLORIDE 0.9 % IV SOLN
2400.0000 mg/m2 | INTRAVENOUS | Status: DC
  Administered 2020-12-22: 4800 mg via INTRAVENOUS
  Filled 2020-12-22: qty 96

## 2020-12-22 NOTE — Progress Notes (Signed)
Nutrition Follow-up:  Patient with stage IV adenocarcinoma of colon with peritoneal carcinomatosis.  Patient receiving folfiri and zirabev.   Met with patient during infusion.  Took dexamethasone for few days then quit taking it because did not think it was helping her.  Taste alterations keeping her from eating. Says that she is tired of milkshakes and boost shakes.  Says that yesterday she drank water but did eat any solid foods.   Medications: dexamethasone (per pt MD wanted her to resume and not stop taking until seen at next visit)  Labs: K 2.8  Anthropometrics:   Weight 191 lb 1.6 oz today  199 lb on 9/21 201 lb on 9/13 208 lb 8/30 216 lb on 8/16 226 lb 7/19 225 lb 7/12 263 lb on 2/1  15% weight loss in the last 3 months, significant  NUTRITION DIAGNOSIS: Inadequate oral intake continues   MALNUTRITION DIAGNOSIS: Patient meet criteria for severe malnutrition in context of chronic illness as evidenced by 15% weight loss in 3 months and eating less than 75 % of estimated energy needs   INTERVENTION:  Encouraged patient to take dexamethasone as prescribed. RD has discussed strategies to help with taste change on past visits    MONITORING, EVALUATION, GOAL: weight trends, intake   NEXT VISIT: Tuesday, Oct 18 during infusion  Judith Ross, [place], [place] Registered Dietitian [phone] (mobile)

## 2020-12-22 NOTE — Patient Instructions (Signed)
[facility] ONCOLOGY  Discharge Instructions: Thank you for choosing [facility] to provide your oncology and hematology care.  If you have a lab appointment with the [facility], please go directly to the [facility] and check in at the registration area.  Wear comfortable clothing and clothing appropriate for easy access to any Portacath or PICC line.   We strive to give you quality time with your provider. You may need to reschedule your appointment if you arrive late (15 or more minutes).  Arriving late affects you and other patients whose appointments are after yours.  Also, if you miss three or more appointments without notifying the office, you may be dismissed from the clinic at the provider's discretion.      For prescription refill requests, have your pharmacy contact our office and allow 72 hours for refills to be completed.    Today you received the following chemotherapy and/or immunotherapy agents Zirabev, irinotecan, Leucovorin and Adrucil       To help prevent nausea and vomiting after your treatment, we encourage you to take your nausea medication as directed.  BELOW ARE SYMPTOMS THAT SHOULD BE REPORTED IMMEDIATELY: *FEVER GREATER THAN 100.4 F (38 C) OR HIGHER *CHILLS OR SWEATING *NAUSEA AND VOMITING THAT IS NOT CONTROLLED WITH YOUR NAUSEA MEDICATION *UNUSUAL SHORTNESS OF BREATH *UNUSUAL BRUISING OR BLEEDING *URINARY PROBLEMS (pain or burning when urinating, or frequent urination) *BOWEL PROBLEMS (unusual diarrhea, constipation, pain near the anus) TENDERNESS IN MOUTH AND THROAT WITH OR WITHOUT PRESENCE OF ULCERS (sore throat, sores in mouth, or a toothache) UNUSUAL RASH, SWELLING OR PAIN  UNUSUAL VAGINAL DISCHARGE OR ITCHING   Items with * indicate a potential emergency and should be followed up as soon as possible or go to the Emergency Department if any problems should occur.  Please show the CHEMOTHERAPY ALERT CARD or  IMMUNOTHERAPY ALERT CARD at check-in to the Emergency Department and triage nurse.  Should you have questions after your visit or need to cancel or reschedule your appointment, please contact [facility]  [phone] and follow the prompts.  Office hours are 8:00 a.m. to 4:30 p.m. Monday - Friday. Please note that voicemails left after 4:00 p.m. may not be returned until the following business day.  We are closed weekends and major holidays. You have access to a nurse at all times for urgent questions. Please call the main number to the clinic [phone] and follow the prompts.  For any non-urgent questions, you may also contact your provider using MyChart. We now offer e-Visits for anyone 72 and older to request care online for non-urgent symptoms. For details visit [email or web address].   Also download the MyChart app! Go to the app store, search "MyChart", open the app, select [facility], and log in with your MyChart username and password.  Due to Covid, a mask is required upon entering the hospital/clinic. If you do not have a mask, one will be given to you upon arrival. For doctor visits, patients may have 1 support person aged 97 or older with them. For treatment visits, patients cannot have anyone with them due to current Covid guidelines and our immunocompromised population.

## 2020-12-22 NOTE — Telephone Encounter (Signed)
MD aware

## 2020-12-22 NOTE — Telephone Encounter (Signed)
Critical results called  from lab Potassium 2.6

## 2020-12-22 NOTE — Progress Notes (Signed)
1029: Per Dr. Grayland Ormond Proceed with treatment Including Zirabev with 12/22/20 labs (Potassium 2.6. UA results: Urine Protein 100, small amount of HGB in urine, and large Leukocytes. ) Per Dr. Grayland Ormond pt to receive 40 mEqs over 2 hours with treatment. Per Duwayne Heck an antibiotic is being called into outpatient pharmacy based on UA results. Pt aware and agrees with plan.   1040: Pt experienced an episode of vomiting and states, that she has a "glob in my throat" that has been there for "the last couple of weeks" and believes that caused her to vomit. Dr. Grayland Ormond aware. Per Dr. Grayland Ormond proceed with treatment as scheduled.   1400: Pt stable at time of discharge. Home infusion pump running with no issues at this time.   1625: Pt reports back to clinic stating that home infusion pump was alarming and stated "high pressure". Pump was stopped at time of arrival and no warning noted. Pt reports that one of the clamps was clamped. All clamps open and pump reading that 3.55 mls of medicine has been given. Line entangled in patient's shirt. After getting line untangled and made sure all clamps were open. Pump restarted. Pump was monitored for 5 minutes to ensure it would run without difficulty.  Home infusion pump running without difficulty. Pt stable at discharge.

## 2020-12-22 NOTE — Progress Notes (Signed)
Pt reports poor appetite with weight loss of 8 lbs since last visit. Pt requesting something to increase appetite.

## 2020-12-24 ENCOUNTER — Encounter: Payer: Self-pay | Admitting: Oncology

## 2020-12-24 ENCOUNTER — Other Ambulatory Visit: Payer: Self-pay | Admitting: Oncology

## 2020-12-24 ENCOUNTER — Inpatient Hospital Stay: Payer: Medicare Other

## 2020-12-24 ENCOUNTER — Other Ambulatory Visit: Payer: Self-pay

## 2020-12-24 VITALS — BP 115/78 | HR 66 | Temp 96.1°F | Resp 18

## 2020-12-24 DIAGNOSIS — C184 Malignant neoplasm of transverse colon: Secondary | ICD-10-CM

## 2020-12-24 DIAGNOSIS — E876 Hypokalemia: Secondary | ICD-10-CM

## 2020-12-24 DIAGNOSIS — R112 Nausea with vomiting, unspecified: Secondary | ICD-10-CM

## 2020-12-24 DIAGNOSIS — Z5111 Encounter for antineoplastic chemotherapy: Secondary | ICD-10-CM | POA: Diagnosis not present

## 2020-12-24 DIAGNOSIS — R110 Nausea: Secondary | ICD-10-CM

## 2020-12-24 LAB — POTASSIUM: Potassium: 2.7 mmol/L — CL (ref 3.5–5.1)

## 2020-12-24 MED ORDER — DEXAMETHASONE SODIUM PHOSPHATE 10 MG/ML IJ SOLN: 10.0000 mg | Freq: Once | INTRAMUSCULAR | Status: DC

## 2020-12-24 MED ORDER — HEPARIN SOD (PORK) LOCK FLUSH 100 UNIT/ML IV SOLN
500.0000 [IU] | Freq: Once | INTRAVENOUS | Status: AC | PRN
  Filled 2020-12-24: qty 5

## 2020-12-24 MED ORDER — POTASSIUM CHLORIDE CRYS ER 10 MEQ PO TBCR: 40.0000 meq | EXTENDED_RELEASE_TABLET | Freq: Once | ORAL | Status: DC

## 2020-12-24 MED ORDER — ONDANSETRON HCL 4 MG/2ML IJ SOLN
8.0000 mg | Freq: Once | INTRAMUSCULAR | Status: AC
  Administered 2020-12-24: 8 mg via INTRAVENOUS
  Filled 2020-12-24: qty 4

## 2020-12-24 MED ORDER — POTASSIUM CHLORIDE 20 MEQ/100ML IV SOLN: 20.0000 meq | Freq: Once | INTRAVENOUS | Status: DC

## 2020-12-24 MED ORDER — PEGFILGRASTIM-BMEZ 6 MG/0.6ML ~~LOC~~ SOSY
6.0000 mg | PREFILLED_SYRINGE | Freq: Once | SUBCUTANEOUS | Status: AC
  Administered 2020-12-24: 6 mg via SUBCUTANEOUS
  Filled 2020-12-24: qty 0.6

## 2020-12-24 MED ORDER — SODIUM CHLORIDE 0.9% FLUSH
10.0000 mL | INTRAVENOUS | Status: DC | PRN
  Administered 2020-12-24: 10 mL
  Filled 2020-12-24: qty 10

## 2020-12-24 MED ORDER — SODIUM CHLORIDE 0.9 % IV SOLN
Freq: Once | INTRAVENOUS | Status: AC
  Administered 2020-12-24: 500 mL via INTRAVENOUS
  Filled 2020-12-24: qty 250

## 2020-12-24 MED ORDER — SODIUM CHLORIDE 0.9 % IV SOLN: 8.0000 mg | Freq: Once | INTRAVENOUS | Status: DC

## 2020-12-24 MED ORDER — POTASSIUM CHLORIDE 20 MEQ/100ML IV SOLN
20.0000 meq | Freq: Once | INTRAVENOUS | Status: AC
  Administered 2020-12-24: 20 meq via INTRAVENOUS

## 2020-12-24 MED ORDER — DEXAMETHASONE SODIUM PHOSPHATE 10 MG/ML IJ SOLN
10.0000 mg | Freq: Once | INTRAMUSCULAR | Status: AC
  Administered 2020-12-24: 10 mg via INTRAVENOUS
  Filled 2020-12-24: qty 1

## 2020-12-24 MED ORDER — HEPARIN SOD (PORK) LOCK FLUSH 100 UNIT/ML IV SOLN
INTRAVENOUS | Status: AC
  Administered 2020-12-24: 500 [IU]
  Filled 2020-12-24: qty 5

## 2020-12-24 MED ORDER — POTASSIUM CHLORIDE 20 MEQ/15ML (10%) PO SOLN: 20.0000 meq | Freq: Two times a day (BID) | ORAL | 0 refills | Status: DC

## 2020-12-24 NOTE — Progress Notes (Signed)
1300- Clarified patient's scheduled appointment today with MD, Dr. Grayland Ormond, and NP, Faythe Casa. Per NP order: patient should have Fluorouracil Pump disconnected and Ziextenzo injection today; also, draw Potassium lab, wait for results, and notify NP once results are back.  1318- Patient reports feeling nauseous on and off the last couple of days. Patient reports vomiting yesterday, 12/23/2020. Patient reports nausea at present time, but denies any vomiting today. NP, Faythe Casa, notified and aware.  1359- Potassium: 2.7. NP, Faythe Casa, notified and aware. Per NP order: administer Potassium Chloride 20 mEq IVPB once over 1 hour today. NP is entering order, see MAR. NP is coming to chair side to evaluate and talk with patient. NP is also entering orders for 0.9% Sodium Chloride infusion 500 ml at 999 ml/hr to be administered  once over 30 minutes, and Ondansetron 8 mg IV once; see MAR.  1440- NP, Faythe Casa, is calling in prescription for Potassium Chloride PO solution to patient's pharmacy.  Per NP order: Patient instructed to take the medication twice a day until she returns to clinic on 01/04/2021 for next appointment.  NP also came by patient's chair side to inform and instruct patient on taking Potassium Chloride medication at home. Patient verbalized understanding.  1541- Patient remains nauseous and vomited in clinic once at this time. NP, Faythe Casa, notified and aware. Per NP order: administer Dexamethasone 10 mg IV once; NP is placing order, see MAR.  1606- Patient reports she is feeling better and denies nausea at this time. Patient states, "I'm ready to go." NP, Faythe Casa, notified and aware. Per NP order: patient may be discharged to home at this time.

## 2020-12-24 NOTE — Addendum Note (Signed)
Addended by: Charlyn Minerva on: 12/24/2020 03:52 PM   Modules accepted: Orders

## 2020-12-24 NOTE — Patient Instructions (Signed)
[facility]   Discharge Instructions: Thank you for choosing [facility] to provide your oncology and hematology care.  If you have a lab appointment with the [facility], please go directly to the [facility] and check in at the registration area.  Wear comfortable clothing and clothing appropriate for easy access to any Portacath or PICC line.   We strive to give you quality time with your provider. You may need to reschedule your appointment if you arrive late (15 or more minutes).  Arriving late affects you and other patients whose appointments are after yours.  Also, if you miss three or more appointments without notifying the office, you may be dismissed from the clinic at the provider's discretion.      For prescription refill requests, have your pharmacy contact our office and allow 72 hours for refills to be completed.    Today you received the following: 0.9% Sodium Chloride, Potassium Chloride, Zofran, Decadron, Ziextenzo.      To help prevent nausea and vomiting after your treatment, we encourage you to take your nausea medication as directed.  BELOW ARE SYMPTOMS THAT SHOULD BE REPORTED IMMEDIATELY: *FEVER GREATER THAN 100.4 F (38 C) OR HIGHER *CHILLS OR SWEATING *NAUSEA AND VOMITING THAT IS NOT CONTROLLED WITH YOUR NAUSEA MEDICATION *UNUSUAL SHORTNESS OF BREATH *UNUSUAL BRUISING OR BLEEDING *URINARY PROBLEMS (pain or burning when urinating, or frequent urination) *BOWEL PROBLEMS (unusual diarrhea, constipation, pain near the anus) TENDERNESS IN MOUTH AND THROAT WITH OR WITHOUT PRESENCE OF ULCERS (sore throat, sores in mouth, or a toothache) UNUSUAL RASH, SWELLING OR PAIN  UNUSUAL VAGINAL DISCHARGE OR ITCHING   Items with * indicate a potential emergency and should be followed up as soon as possible or go to the Emergency Department if any problems should occur.  Please show the CHEMOTHERAPY ALERT CARD or IMMUNOTHERAPY  ALERT CARD at check-in to the Emergency Department and triage nurse.  Should you have questions after your visit or need to cancel or reschedule your appointment, please contact [facility]  [phone] and follow the prompts.  Office hours are 8:00 a.m. to 4:30 p.m. Monday - Friday. Please note that voicemails left after 4:00 p.m. may not be returned until the following business day.  We are closed weekends and major holidays. You have access to a nurse at all times for urgent questions. Please call the main number to the clinic [phone] and follow the prompts.  For any non-urgent questions, you may also contact your provider using MyChart. We now offer e-Visits for anyone 57 and older to request care online for non-urgent symptoms. For details visit [email or web address].   Also download the MyChart app! Go to the app store, search "MyChart", open the app, select [facility], and log in with your MyChart username and password.  Due to Covid, a mask is required upon entering the hospital/clinic. If you do not have a mask, one will be given to you upon arrival. For doctor visits, patients may have 1 support person aged 63 or older with them. For treatment visits, patients cannot have anyone with them due to current Covid guidelines and our immunocompromised population.

## 2020-12-24 NOTE — Progress Notes (Signed)
Orders in for N/V  Faythe Casa, NP 12/24/2020 3:50 PM

## 2021-01-03 ENCOUNTER — Inpatient Hospital Stay
Admission: EM | Admit: 2021-01-03 | Discharge: 2021-01-11 | DRG: 641 | Disposition: A | Discharge status: Home Health | Payer: Medicare Other | Attending: Internal Medicine | Admitting: Internal Medicine

## 2021-01-03 ENCOUNTER — Other Ambulatory Visit: Payer: Self-pay

## 2021-01-03 ENCOUNTER — Encounter: Payer: Self-pay | Admitting: Emergency Medicine

## 2021-01-03 ENCOUNTER — Emergency Department: Payer: Medicare Other

## 2021-01-03 DIAGNOSIS — Z79899 Other long term (current) drug therapy: Secondary | ICD-10-CM

## 2021-01-03 DIAGNOSIS — Z823 Family history of stroke: Secondary | ICD-10-CM

## 2021-01-03 DIAGNOSIS — Z803 Family history of malignant neoplasm of breast: Secondary | ICD-10-CM

## 2021-01-03 DIAGNOSIS — Z86718 Personal history of other venous thrombosis and embolism: Secondary | ICD-10-CM

## 2021-01-03 DIAGNOSIS — E162 Hypoglycemia, unspecified: Secondary | ICD-10-CM | POA: Diagnosis present

## 2021-01-03 DIAGNOSIS — I472 Ventricular tachycardia, unspecified: Secondary | ICD-10-CM | POA: Diagnosis present

## 2021-01-03 DIAGNOSIS — Z86711 Personal history of pulmonary embolism: Secondary | ICD-10-CM | POA: Diagnosis present

## 2021-01-03 DIAGNOSIS — E890 Postprocedural hypothyroidism: Secondary | ICD-10-CM | POA: Diagnosis present

## 2021-01-03 DIAGNOSIS — I4891 Unspecified atrial fibrillation: Secondary | ICD-10-CM | POA: Diagnosis present

## 2021-01-03 DIAGNOSIS — Z833 Family history of diabetes mellitus: Secondary | ICD-10-CM

## 2021-01-03 DIAGNOSIS — Z7901 Long term (current) use of anticoagulants: Secondary | ICD-10-CM

## 2021-01-03 DIAGNOSIS — G8929 Other chronic pain: Secondary | ICD-10-CM | POA: Diagnosis present

## 2021-01-03 DIAGNOSIS — I4729 Other ventricular tachycardia: Secondary | ICD-10-CM | POA: Diagnosis present

## 2021-01-03 DIAGNOSIS — E785 Hyperlipidemia, unspecified: Secondary | ICD-10-CM | POA: Diagnosis present

## 2021-01-03 DIAGNOSIS — T503X6A Underdosing of electrolytic, caloric and water-balance agents, initial encounter: Secondary | ICD-10-CM | POA: Diagnosis present

## 2021-01-03 DIAGNOSIS — D849 Immunodeficiency, unspecified: Secondary | ICD-10-CM | POA: Diagnosis present

## 2021-01-03 DIAGNOSIS — Z91128 Patient's intentional underdosing of medication regimen for other reason: Secondary | ICD-10-CM

## 2021-01-03 DIAGNOSIS — E876 Hypokalemia: Secondary | ICD-10-CM | POA: Diagnosis not present

## 2021-01-03 DIAGNOSIS — Z20822 Contact with and (suspected) exposure to covid-19: Secondary | ICD-10-CM | POA: Diagnosis present

## 2021-01-03 DIAGNOSIS — Z9221 Personal history of antineoplastic chemotherapy: Secondary | ICD-10-CM

## 2021-01-03 DIAGNOSIS — D329 Benign neoplasm of meninges, unspecified: Secondary | ICD-10-CM | POA: Diagnosis present

## 2021-01-03 DIAGNOSIS — C184 Malignant neoplasm of transverse colon: Secondary | ICD-10-CM | POA: Diagnosis present

## 2021-01-03 DIAGNOSIS — N390 Urinary tract infection, site not specified: Secondary | ICD-10-CM | POA: Diagnosis present

## 2021-01-03 DIAGNOSIS — E860 Dehydration: Secondary | ICD-10-CM | POA: Diagnosis not present

## 2021-01-03 DIAGNOSIS — R197 Diarrhea, unspecified: Secondary | ICD-10-CM | POA: Diagnosis present

## 2021-01-03 DIAGNOSIS — Z79890 Hormone replacement therapy (postmenopausal): Secondary | ICD-10-CM

## 2021-01-03 DIAGNOSIS — B9620 Unspecified Escherichia coli [E. coli] as the cause of diseases classified elsewhere: Secondary | ICD-10-CM | POA: Diagnosis present

## 2021-01-03 DIAGNOSIS — R630 Anorexia: Secondary | ICD-10-CM | POA: Diagnosis present

## 2021-01-03 DIAGNOSIS — I959 Hypotension, unspecified: Secondary | ICD-10-CM | POA: Diagnosis present

## 2021-01-03 DIAGNOSIS — I10 Essential (primary) hypertension: Secondary | ICD-10-CM | POA: Diagnosis present

## 2021-01-03 DIAGNOSIS — Z8249 Family history of ischemic heart disease and other diseases of the circulatory system: Secondary | ICD-10-CM

## 2021-01-03 LAB — COMPREHENSIVE METABOLIC PANEL
ALT: 14 U/L (ref 0–44)
AST: 19 U/L (ref 15–41)
Albumin: 3 g/dL — ABNORMAL LOW (ref 3.5–5.0)
Alkaline Phosphatase: 90 U/L (ref 38–126)
Anion gap: 19 — ABNORMAL HIGH (ref 5–15)
BUN: 8 mg/dL (ref 8–23)
CO2: 26 mmol/L (ref 22–32)
Calcium: 8.3 mg/dL — ABNORMAL LOW (ref 8.9–10.3)
Chloride: 92 mmol/L — ABNORMAL LOW (ref 98–111)
Creatinine, Ser: 0.93 mg/dL (ref 0.44–1.00)
GFR, Estimated: >60 mL/min (ref >60)
Glucose, Bld: 111 mg/dL — ABNORMAL HIGH (ref 70–99)
Potassium: 2.8 mmol/L — ABNORMAL LOW (ref 3.5–5.1)
Sodium: 137 mmol/L (ref 135–145)
Total Bilirubin: 1.8 mg/dL — ABNORMAL HIGH (ref 0.3–1.2)
Total Protein: 7.1 g/dL (ref 6.5–8.1)

## 2021-01-03 LAB — URINALYSIS, COMPLETE (UACMP) WITH MICROSCOPIC
Bacteria, UA: NONE SEEN
Glucose, UA: NEGATIVE mg/dL
Hgb urine dipstick: NEGATIVE
Ketones, ur: 20 mg/dL — AB
Leukocytes,Ua: NEGATIVE
Nitrite: NEGATIVE
Protein, ur: 100 mg/dL — AB
Specific Gravity, Urine: >1.046 — ABNORMAL HIGH (ref 1.005–1.030)
pH: 6 (ref 5.0–8.0)

## 2021-01-03 LAB — CBC
HCT: 36.9 % (ref 36.0–46.0)
Hemoglobin: 12.5 g/dL (ref 12.0–15.0)
MCH: 29.6 pg (ref 26.0–34.0)
MCHC: 33.9 g/dL (ref 30.0–36.0)
MCV: 87.2 fL (ref 80.0–100.0)
Platelets: 128 10*3/uL — ABNORMAL LOW (ref 150–400)
RBC: 4.23 MIL/uL (ref 3.87–5.11)
RDW: 20.2 % — ABNORMAL HIGH (ref 11.5–15.5)
WBC: 1.9 10*3/uL — ABNORMAL LOW (ref 4.0–10.5)
nRBC: 1.6 % — ABNORMAL HIGH (ref 0.0–0.2)

## 2021-01-03 LAB — TROPONIN I (HIGH SENSITIVITY)
Troponin I (High Sensitivity): 13 ng/L (ref <18)
Troponin I (High Sensitivity): 15 ng/L (ref <18)

## 2021-01-03 LAB — PROTIME-INR
INR: 1.3 — ABNORMAL HIGH (ref 0.8–1.2)
Prothrombin Time: 16.2 seconds — ABNORMAL HIGH (ref 11.4–15.2)

## 2021-01-03 LAB — LACTIC ACID, PLASMA: Lactic Acid, Venous: 2.2 mmol/L (ref 0.5–1.9)

## 2021-01-03 MED ORDER — HYDROCODONE-ACETAMINOPHEN 5-325 MG PO TABS: 1.0000 | ORAL_TABLET | ORAL | Status: DC | PRN

## 2021-01-03 MED ORDER — IOHEXOL 350 MG/ML SOLN
75.0000 mL | Freq: Once | INTRAVENOUS | Status: AC | PRN
  Administered 2021-01-03: 75 mL via INTRAVENOUS

## 2021-01-03 MED ORDER — BISACODYL 5 MG PO TBEC: 5.0000 mg | DELAYED_RELEASE_TABLET | Freq: Every day | ORAL | Status: DC | PRN

## 2021-01-03 MED ORDER — LACTATED RINGERS IV SOLN: INTRAVENOUS | Status: DC

## 2021-01-03 MED ORDER — SENNOSIDES-DOCUSATE SODIUM 8.6-50 MG PO TABS: 1.0000 | ORAL_TABLET | Freq: Every evening | ORAL | Status: DC | PRN

## 2021-01-03 MED ORDER — HYDRALAZINE HCL 20 MG/ML IJ SOLN: 10.0000 mg | Freq: Four times a day (QID) | INTRAMUSCULAR | Status: DC | PRN

## 2021-01-03 MED ORDER — METOPROLOL TARTRATE 5 MG/5ML IV SOLN: 5.0000 mg | Freq: Four times a day (QID) | INTRAVENOUS | Status: DC | PRN

## 2021-01-03 MED ORDER — MORPHINE SULFATE (PF) 2 MG/ML IV SOLN: 2.0000 mg | INTRAVENOUS | Status: DC | PRN

## 2021-01-03 MED ORDER — ONDANSETRON HCL 4 MG PO TABS: 4.0000 mg | ORAL_TABLET | Freq: Four times a day (QID) | ORAL | Status: DC | PRN

## 2021-01-03 MED ORDER — RIVAROXABAN 20 MG PO TABS
20.0000 mg | ORAL_TABLET | Freq: Every day | ORAL | Status: DC
  Administered 2021-01-03 – 2021-01-10 (×8): 20 mg via ORAL
  Filled 2021-01-03 (×10): qty 1

## 2021-01-03 MED ORDER — DILTIAZEM HCL 25 MG/5ML IV SOLN: 10.0000 mg | Freq: Four times a day (QID) | INTRAVENOUS | Status: DC | PRN

## 2021-01-03 MED ORDER — LEVOTHYROXINE SODIUM 50 MCG PO TABS
150.0000 ug | ORAL_TABLET | Freq: Every day | ORAL | Status: DC
  Administered 2021-01-04 – 2021-01-11 (×8): 150 ug via ORAL
  Filled 2021-01-03: qty 3
  Filled 2021-01-03 (×4): qty 1
  Filled 2021-01-03: qty 3
  Filled 2021-01-03 (×2): qty 1

## 2021-01-03 MED ORDER — SODIUM CHLORIDE 0.9 % IV BOLUS
1000.0000 mL | Freq: Once | INTRAVENOUS | Status: AC
  Administered 2021-01-03: 1000 mL via INTRAVENOUS

## 2021-01-03 MED ORDER — ONDANSETRON HCL 4 MG/2ML IJ SOLN
4.0000 mg | Freq: Four times a day (QID) | INTRAMUSCULAR | Status: DC | PRN
  Administered 2021-01-11: 4 mg via INTRAVENOUS
  Filled 2021-01-03: qty 2

## 2021-01-03 MED ORDER — ATORVASTATIN CALCIUM 10 MG PO TABS
10.0000 mg | ORAL_TABLET | Freq: Every day | ORAL | Status: DC
  Administered 2021-01-03 – 2021-01-10 (×8): 10 mg via ORAL
  Filled 2021-01-03 (×9): qty 1

## 2021-01-03 MED ORDER — POTASSIUM CHLORIDE CRYS ER 20 MEQ PO TBCR
40.0000 meq | EXTENDED_RELEASE_TABLET | Freq: Once | ORAL | Status: DC
  Filled 2021-01-03: qty 2

## 2021-01-03 MED ORDER — ACETAMINOPHEN 650 MG RE SUPP
650.0000 mg | Freq: Four times a day (QID) | RECTAL | Status: DC | PRN
  Filled 2021-01-03: qty 1

## 2021-01-03 MED ORDER — DILTIAZEM HCL-DEXTROSE 125-5 MG/125ML-% IV SOLN (PREMIX)
5.0000 mg/h | INTRAVENOUS | Status: DC
  Filled 2021-01-03: qty 125

## 2021-01-03 MED ORDER — POTASSIUM CHLORIDE 20 MEQ PO PACK
40.0000 meq | PACK | Freq: Two times a day (BID) | ORAL | Status: DC
  Administered 2021-01-04 – 2021-01-06 (×5): 40 meq via ORAL
  Filled 2021-01-03 (×5): qty 2

## 2021-01-03 MED ORDER — POTASSIUM CHLORIDE 20 MEQ PO PACK
40.0000 meq | PACK | Freq: Once | ORAL | Status: AC
  Administered 2021-01-03: 40 meq via ORAL
  Filled 2021-01-03: qty 2

## 2021-01-03 MED ORDER — ACETAMINOPHEN 325 MG PO TABS: 650.0000 mg | ORAL_TABLET | Freq: Four times a day (QID) | ORAL | Status: DC | PRN

## 2021-01-03 MED ORDER — POTASSIUM CHLORIDE 10 MEQ/100ML IV SOLN
10.0000 meq | Freq: Once | INTRAVENOUS | Status: AC
  Administered 2021-01-03: 10 meq via INTRAVENOUS
  Filled 2021-01-03: qty 100

## 2021-01-03 NOTE — H&P (Addendum)
History and Physical    [name]:[id number] DOB: 1955/11/14 DOA: 01/03/2021  PCP: Venia Carbon, MD  Patient coming from: Home   Oncologist: Dr. Grayland Ormond  I have personally briefly reviewed patient's old medical records in [facility].  Chief Complaint: generalized weakness  HPI: Judith Ross is a 65 y.o. female with medical history significant for colon cancer (diagnosed 08/18/20) currently undergoing treatment (last chemo 12/22/20), pulmonary embolism July 2022 (first ever PE; now on Xarelto), hypothyroidism, hypertension, hyperlipidemia, who presents to the emergency department on 01/03/2021 with generalized weakness. She fell on the day of admission at home when she was trying to get out of bed; she felt dizzy before and after she fell. She did hit her head on the right side. Associated symptoms: Shortness of breath x 1 week without coughing or wheezing; shortness of breath is not worse with exertion. She does not have a headache. She has generalized weakness that is not worse in 1 arm or 1 leg. She reports she has chronic abdominal pain that is located all over the abdomen, does not radiate, is up to 8/10 at times and is characterized as cramping; this is her usual abdominal pain; it is alleviated by nothing and exacerbated by nothing. She has had diarrhea x 2 weeks. No nausea, vomiting, or bloody stool. No dysuria or hematuria. No chest pain or palpitations. She does not use a CPAP. She has no history of atrial fibrillation, CAD, or CHF.  Patient reported that she has low potassium normally but stopped taking the medication because the pills were too big to swallow.   ED Course: Labs included potassium 2.8, WBCs 1.9, hemoglobin 12.5, platelets 128.  EKG showed new onset atrial fibrillation with rapid ventricular response.  Patient was given potassium oral and IV.  She was given IV diltiazem and rate improved, but then it worsened again.  She was going to be started on IV  diltiazem drip, which was ordered, but then her heart rate improved again, so it was not actually started.  She had an episode of a short run of ventricular tachycardia that resolved without treatment.  She was given IV fluids for dehydration.  Head CT was done due to history of fall at home and hitting head; head CT showed no acute process but did show a small lesion thought to be a meningioma.  CTA of the chest showed no pulmonary embolism but did show a small right pleural effusion.  Review of Systems: As per HPI otherwise all other systems reviewed and are unremarable. GENERAL: No Fever, chills, or diaphoresis. Positive for fatigue/malaise and weight loss.   Past Medical History:  Diagnosis Date   Arthritis    Chicken pox    Helicobacter pylori gastritis    Hyperlipidemia    Hypertension    Metastatic colon cancer in female [facility])    Obesity    Sleep apnea    Thyroid disease     Past Surgical History:  Procedure Laterality Date   ABDOMINAL HYSTERECTOMY  12/2009   total   COLONOSCOPY WITH PROPOFOL N/A 08/13/2020   Procedure: COLONOSCOPY WITH PROPOFOL;  Surgeon: Jonathon Bellows, MD;  Location: [facility] ENDOSCOPY;  Service: Gastroenterology;  Laterality: N/A;   ESOPHAGOGASTRODUODENOSCOPY (EGD) WITH PROPOFOL N/A 08/13/2020   Procedure: ESOPHAGOGASTRODUODENOSCOPY (EGD) WITH PROPOFOL;  Surgeon: Jonathon Bellows, MD;  Location[facility] END[facility]COPY;  Service: Gastroenterology;  Laterality: N/A;   IR IMAGING GUIDED PORT INSERTION  08/27/2020   supracervical abdominal hysterectomy and bilateral salpingo--oophorectomy 01-17-2010  for fibroids  01/17/2010   TOTAL THYROIDECTOMY  1991-92   TUBAL LIGATION      Social History  reports that she has never smoked. She has never used smokeless tobacco. She reports that she does not drink alcohol and does not use drugs.  Allergies  Allergen Reactions   Erythromycin Itching    Family History  Problem Relation Age of Onset   Stroke Father    Diabetes Maternal  Grandmother    Hypertension Maternal Grandmother    Diabetes Maternal Uncle    Cancer Maternal Aunt        Breast     Home Medications  Prior to Admission medications   Medication Sig Start Date End Date Taking? Authorizing Provider  atorvastatin (LIPITOR) 10 MG tablet TAKE 1 TABLET(10 MG) BY MOUTH DAILY 07/30/20  Yes Dutch Quint B, FNP  levothyroxine (SYNTHROID) 150 MCG tablet Take 1 tablet (150 mcg total) by mouth daily. 08/18/20  Yes Venia Carbon, MD  prochlorperazine (COMPAZINE) 10 MG tablet Take 10 mg by mouth 2 (two) times daily as needed for nausea or vomiting.   Yes [provider]  XARELTO 20 MG TABS tablet TAKE 1 TABLET BY MOUTH DAILY WITH SUPPER. START ON FINAL FULL DAY OF LOVENOX INJECTIONS. 12/02/20  Yes Lloyd Huger, MD  clobetasol ointment (TEMOVATE) 0.05 % Apply to affected area every night for 4 weeks, then every other day for 4 weeks and then twice a week for 4 weeks or until resolution. Patient not taking: No sig reported 05/14/17   [name], MD  dexamethasone (DECADRON) 4 MG tablet Take 1 tablet (4 mg total) by mouth daily. Patient not taking: Reported on 01/03/2021 11/30/20   Lloyd Huger, MD  enoxaparin (LOVENOX) 100 MG/ML injection Inject 1 mL (100 mg total) into the skin every 12 (twelve) hours. Patient not taking: No sig reported 10/19/20   Lloyd Huger, MD  feeding supplement (ENSURE ENLIVE / ENSURE PLUS) LIQD Take 237 mLs by mouth 3 (three) times daily between meals. 09/22/20   Nolberto Hanlon, MD  oxyCODONE-acetaminophen (PERCOCET/ROXICET) 5-325 MG tablet Take 1 tablet by mouth every 4 (four) hours as needed for severe pain. Patient not taking: No sig reported 08/18/20   Lloyd Huger, MD  pantoprazole (PROTONIX) 40 MG tablet Take 1 tablet (40 mg total) by mouth 2 (two) times daily. Patient not taking: Reported on 11/16/2020 09/22/20 10/22/20  Nolberto Hanlon, MD  potassium chloride 20 MEQ/15ML (10%) SOLN Take 15 mLs (20 mEq total)  by mouth 2 (two) times daily. Patient not taking: Reported on 01/03/2021 12/24/20   Jacquelin Hawking, NP  potassium chloride SA (KLOR-CON) 20 MEQ tablet TAKE 1 TABLET BY MOUTH TWICE DAILY Patient not taking: Reported on 01/03/2021 11/30/20   Lloyd Huger, MD    Physical Exam: Vitals:   01/03/21 1810 01/03/21 1815 01/03/21 1828 01/03/21 1841  BP: 107/70     Pulse: 86     Resp: 19 20 17 15   Temp:      TempSrc:      SpO2: 97%     Weight:      Height:        Constitutional: NAD, calm, comfortable, ill-appearing. Vitals:   01/03/21 1810 01/03/21 1815 01/03/21 1828 01/03/21 1841  BP: 107/70     Pulse: 86     Resp: 19 20 17 15   Temp:      TempSrc:      SpO2: 97%     Weight:  Height:       Eyes: Pupils equal and round, lids and conjunctivae without icterus or erythema. ENMT: Mucous membranes are dry. Posterior pharynx clear of any exudate or lesions, except tongue with hyperpigmented macules (per patient is chronic from chemotherapy). Nares patent without discharge or bleeding.  Normocephalic, atraumatic.  Normal dentition.  Neck: normal, supple, no masses, trachea midline.  Thyroid nontender, no masses appreciated, no thyromegaly. Respiratory: clear to auscultation bilaterally. Chest wall movements are symmetric. No wheezing, no crackles.  No rhonchi.  Normal respiratory effort. No accessory muscle use.  Cardiovascular: Normal S1, S2, no murmurs / rubs / gallops. Irregular. Pulses: Radial and DP pulses 2+ bilaterally. No carotid bruits.  Capillary refill less than 3 seconds. Edema: None bilaterally. GI: soft, non-distended, normal active bowel sounds. No hepatosplenomegaly. No rigidity, rebound, or guarding. No CVA tenderness bilaterally. Mild suprapubic tenderness. No masses palpated.  Musculoskeletal: no clubbing / cyanosis. No joint deformity upper and lower extremities. Good ROM, no contractures. Normal muscle tone.  No tenderness or deformity in the back  bilaterally. Integument: no rashes. No induration. Clean, dry, intact. Soles of feet bilaterally with 0.5 cm or less diameter hyperpigmented macules (per patient is chronic from chemotherapy). Neurologic: CN 2-12 grossly intact. Sensation grossly intact to light touch. DTR 2+ bilaterally.  Babinski: Toes downgoing bilaterally.  Strength 5/5 in all 4.  Intact rapid alternating movements bilaterally.  No pronator drift. Psychiatric: Normal judgment and insight. Alert and oriented x 3. Normal mood.  Normal and appropriate affect. Lymphatic: No cervical lymphadenopathy. No supraclavicular lymphadenopathy.   Labs on Admission: I have personally reviewed the following labs and imaging studies.  CBC: Recent Labs  Lab 01/03/21 1556  WBC 1.9*  HGB 12.5  HCT 36.9  MCV 87.2  PLT 128*    Basic Metabolic Panel: Recent Labs  Lab 01/03/21 1556  NA 137  K 2.8*  CL 92*  CO2 26  GLUCOSE 111*  BUN 8  CREATININE 0.93  CALCIUM 8.3*    GFR: Estimated Creatinine Clearance: 66.6 mL/min (by C-G formula based on SCr of 0.93 mg/dL).  Liver Function Tests: Recent Labs  Lab 01/03/21 1556  AST 19  ALT 14  ALKPHOS 90  BILITOT 1.8*  PROT 7.1  ALBUMIN 3.0*    Urine analysis: Ordered   Radiological Exams on Admission: CT HEAD WO CONTRAST (5MM)  Result Date: 01/03/2021 CLINICAL DATA:  Head trauma fall EXAM: CT HEAD WITHOUT CONTRAST TECHNIQUE: Contiguous axial images were obtained from the base of the skull through the vertex without intravenous contrast. COMPARISON:  None. FINDINGS: Brain: No acute territorial infarction, or hemorrhage. Possible small extra-axial mass at the left parietal vertex with calcification, sagittal series 5, image 32, measuring 8 mm, probably representing meningioma. The ventricles are non enlarged. Vascular: No hyperdense vessels.  No unexpected calcification Skull: Normal. Negative for fracture or focal lesion. Sinuses/Orbits: No acute finding. Other: None  IMPRESSION: 1. No CT evidence for acute intracranial abnormality. 2. Suspicion of small extra-axial mass at the left parietal vertex, which may represent small meningioma Electronically Signed   By: Donavan Foil M.D.   On: 01/03/2021 17:49   CT Angio Chest PE W and/or Wo Contrast  Result Date: 01/03/2021 CLINICAL DATA:  Weakness AFib EXAM: CT ANGIOGRAPHY CHEST WITH CONTRAST TECHNIQUE: Multidetector CT imaging of the chest was performed using the standard protocol during bolus administration of intravenous contrast. Multiplanar CT image reconstructions and MIPs were obtained to evaluate the vascular anatomy. CONTRAST:  76mL OMNIPAQUE IOHEXOL 350  MG/ML SOLN COMPARISON:  CT 11/23/2020, chest CT 09/18/2020 FINDINGS: Cardiovascular: Satisfactory opacification of the pulmonary arteries to the segmental level. Negative for acute filling defect within the main, lobar or segmental pulmonary vessels. Hypoenhancement right lower lobe subsegmental arteries, for example series 7, image 24. The vessels appear irregular and attenuated in caliber compared to the prior CTA and suspect that findings are likely related to chronic PE. Negative for pericardial effusion. Borderline cardiomegaly. Mediastinum/Nodes: Midline trachea. No thyroid mass. Postsurgical changes of the left lobe. No suspicious adenopathy. Esophagus within normal limits Lungs/Pleura: Small right-sided pleural effusion. Mild ground-glass density in the right lung base, could be secondary to mosaic perfusion. Upper Abdomen: No acute abnormality. Mild stranding in left upper quadrant omentum without change. Musculoskeletal: Incompletely visualized nodularity within the subareolar left breast, without significant change Review of the MIP images confirms the above findings. IMPRESSION: 1. No definite evidence for acute main, lobar, or segmental pulmonary embolus. Poor enhancement of right lower lobe subsegmental vessels with overall attenuated caliber of vessels,  suspect that findings are related to sequela of chronic PE given findings on prior angiography. 2. Small right-sided pleural effusion Electronically Signed   By: Donavan Foil M.D.   On: 01/03/2021 18:06    EKG: Independently reviewed.  158 bpm.  Atrial fibrillation with rapid ventricular response.  ST depressions in leads I, II, and aVL.  Echocardiogram, 09/19/2020: IMPRESSIONS  1. Left ventricular ejection fraction, by estimation, is 70 to 75%. The  left ventricle has hyperdynamic function. The left ventricle has no  regional wall motion abnormalities. There is moderate left ventricular  hypertrophy. Left ventricular diastolic parameters are consistent with Grade I diastolic dysfunction (impaired relaxation).   2. Right ventricular systolic function is normal. The right ventricular  size is mildly enlarged. There is moderately elevated pulmonary artery  systolic pressure.   3. Right atrial size was mildly dilated.   4. The mitral valve was not well visualized. Trivial mitral valve  regurgitation.   5. The aortic valve is calcified. Aortic valve regurgitation is trivial.    Assessment/Plan  Principal Problem:   Atrial fibrillation with rapid ventricular response ([place]) New onset atrial fibrillation. Plan: IV diltiazem drip initially ordered but not started due to improvement in rate.  Did have additional episodes of rapid rate.  We will give IV metoprolol as needed for heart rate sustained greater than 115.  Give IV diltiazem as needed as second line option.  Echocardiogram not ordered because patient just had an echocardiogram in July 2022.  Correct potassium.  Check magnesium and correct if needed.  Check TSH and free T4.  Continue anticoagulation with Xarelto, which she is taking for history of pulmonary embolism.  If patient continues to have atrial fibrillation in the a.m., then consult cardiology.   Active Problems:   Nonsustained ventricular tachycardia Patient had a short run of  nonsustained ventricular tachycardia.  Happened in the setting of a atrial fibrillation, new onset.  She does not have any chest pain. Plan: Correct electrolytes.  Telemetry.  If patient has further episodes, then consider cardiology consult in a.m.    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  Plan: Continue outpatient follow-up with oncologist.    Meningioma [facility]) Lesion on head CT most consistent with a meningioma.  Plan: Follow-up with primary care and oncologist.  Explained to patient and her son that the lesion is most likely a meningioma.    History of pulmonary embolism and    Current use of long  term anticoagulation Plan: Continue Xarelto.    Hypokalemia Severe.  Plan: Replace IV and p.o.  Check magnesium and replace if needed.    Diarrhea Has had fairly severe diarrhea.  Is immunocompromised.  Plan: Ordered stool cultures, C. difficile, blood cultures, and UA with urine culture.  If there is evidence of infection, then start antibiotics.    Leukopenia, thrombocytopenia, mild anemia Likely due to chemotherapy.  Plan: Monitor CBC.  Recheck in AM.    Lightheadedness Likely due to dehydration.  Plan: IV fluids.    Fall at home, initial encounter May be due to lightheadedness.  Plan: Head CT ordered and negative for acute process.    Hypothyroidism, acquired Plan: Continue home Synthroid. Check TSH and free T4 due to new A fib.    Suprapubic tenderness Could be due to her colon cancer, but could also be an indication of urinary tract infection.  Plan: Urinalysis and urine culture.    DVT prophylaxis: Xarelto.  Code Status:   Full Code Family Communication:  With son at bedside   Disposition Plan:   Patient is from:  Home  Anticipated DC to:  Home  Anticipated DC date:  01/04/2021  Anticipated DC barriers: heart rate  Consults called:  None  Admission status:  Observation  Severity of Illness: The appropriate patient status for  this patient is OBSERVATION. Observation status is judged to be reasonable and necessary in order to provide the required intensity of service to ensure the patient's safety. The patient's presenting symptoms, physical exam findings, and initial radiographic and laboratory data in the context of their medical condition is felt to place them at decreased risk for further clinical deterioration.  Findings: New atrial fibrillation with RVR.  Significant hypokalemia.   Episode of nonsustained ventricular tachycardia.  Suprapubic tenderness.  History of diarrhea.    Furthermore, it is anticipated that the patient will be medically stable for discharge from the hospital within 2 midnights of admission.     Tacey Ruiz MD [facility]  How to contact the [facility] Attending or Consulting provider [place] or covering provider during after hours [place], for this patient?   Check the care team in [facility] and look for a) attending/consulting [facility] provider listed and b) the [facility] team listed Log into [email or web address] and use [facility]'s universal password to access. If you do not have the password, please contact the hospital operator. Locate the [facility] provider you are looking for under [facility] and page to a number that you can be directly reached. If you still have difficulty reaching the provider, please page the [facility] (Director on Call) for the Hospitalists listed on amion for assistance.  01/03/2021, 7:03 PM

## 2021-01-03 NOTE — ED Notes (Addendum)
See triage note, pt reports fall today hitting head from dizziness and weakness. Pt alert and oriented. Cancer pt.  Denies cp

## 2021-01-03 NOTE — ED Triage Notes (Signed)
Arrives via [facility].  Fall in home.  Patient denies LOC.  States felt weak prior to fall.  ON EMS arrival, patient noted to be in AFIB 140-160's.  No history of Afib.  Initial BP 140/70's.  Enroute BP 80/50.  NS given, BP improved.  Patient takes [place].

## 2021-01-03 NOTE — ED Notes (Signed)
Pt emesis, repeat EKG performed and given to Dr Kerman Passey

## 2021-01-03 NOTE — ED Provider Notes (Addendum)
[facility] Emergency Department Provider Note  Time seen: 3:58 PM  I have reviewed the triage vital signs and the nursing notes.   HISTORY  Chief Complaint Fall   HPI Judith Ross is a 65 y.o. female with a past medical history of arthritis, hypertension, hyperlipidemia, obesity, prior DVT/PE on Xarelto presents to the emergency department after a fall.  According to EMS report family states they heard a fall, found the patient on the floor.  However they state over the past 3 days the patient has not been acting normal, has been more fatigued than typical, appears to be confused at times, has not been eating or drinking much.  No other known falls.  Patient denies any pain.  She is alert and oriented x3 currently.  Noted to be tachycardic around 150 to 160 bpm atrial fibrillation with rapid ventricular response with no history of A. fib previously.  Patient also noted to be hypotensive.  Patient mentating well.  Denies any pain.  No headache.   Past Medical History:  Diagnosis Date   Arthritis    Chicken pox    Helicobacter pylori gastritis    Hyperlipidemia    Hypertension    Metastatic colon cancer in female [facility])    Obesity    Sleep apnea    Thyroid disease     Patient Active Problem List   Diagnosis Date Noted   Acute deep vein thrombosis (DVT) of right peroneal vein ([place]) 09/28/2020   Chemotherapy induced neutropenia ([place]) 09/28/2020   Acute pulmonary embolism ([place]) 09/18/2020   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[place]) 08/18/2020   Abdominal pain 07/14/2020   Prediabetes 10/08/2014   Essential hypertension 05/11/2014   HLD (hyperlipidemia) 05/11/2014   Hypothyroidism 05/11/2014   Arthritis 05/11/2014   Obstructive sleep apnea 05/11/2014    Past Surgical History:  Procedure Laterality Date   ABDOMINAL HYSTERECTOMY  12/2009   total   COLONOSCOPY WITH PROPOFOL N/A 08/13/2020   Procedure: COLONOSCOPY WITH PROPOFOL;  Surgeon: Jonathon Bellows,  MD;  Location: [facility] ENDOSCOPY;  Service: Gastroenterology;  Laterality: N/A;   ESOPHAGOGASTRODUODENOSCOPY (EGD) WITH PROPOFOL N/A 08/13/2020   Procedure: ESOPHAGOGASTRODUODENOSCOPY (EGD) WITH PROPOFOL;  Surgeon: Jonathon Bellows, MD;  Location: [facility] ENDOSCOPY;  Service: Gastroenterology;  Laterality: N/A;   IR IMAGING GUIDED PORT INSERTION  08/27/2020   supracervical abdominal hysterectomy and bilateral salpingo--oophorectomy 01-17-2010 for fibroids  01/17/2010   TOTAL THYROIDECTOMY  1991-92   TUBAL LIGATION      Prior to Admission medications   Medication Sig Start Date End Date Taking? Authorizing Provider  atorvastatin (LIPITOR) 10 MG tablet TAKE 1 TABLET(10 MG) BY MOUTH DAILY 07/30/20   Dutch Quint B, FNP  clobetasol ointment (TEMOVATE) 0.05 % Apply to affected area every night for 4 weeks, then every other day for 4 weeks and then twice a week for 4 weeks or until resolution. Patient not taking: Reported on 12/22/2020 05/14/17   [name], MD  dexamethasone (DECADRON) 4 MG tablet Take 1 tablet (4 mg total) by mouth daily. 11/30/20   Lloyd Huger, MD  enoxaparin (LOVENOX) 100 MG/ML injection Inject 1 mL (100 mg total) into the skin every 12 (twelve) hours. Patient not taking: No sig reported 10/19/20   Lloyd Huger, MD  feeding supplement (ENSURE ENLIVE / ENSURE PLUS) LIQD Take 237 mLs by mouth 3 (three) times daily between meals. 09/22/20   Nolberto Hanlon, MD  levothyroxine (SYNTHROID) 150 MCG tablet Take 1 tablet (150 mcg total) by mouth daily.  08/18/20   Venia Carbon, MD  oxyCODONE-acetaminophen (PERCOCET/ROXICET) 5-325 MG tablet Take 1 tablet by mouth every 4 (four) hours as needed for severe pain. Patient not taking: No sig reported 08/18/20   Lloyd Huger, MD  pantoprazole (PROTONIX) 40 MG tablet Take 1 tablet (40 mg total) by mouth 2 (two) times daily. Patient not taking: Reported on 11/16/2020 09/22/20 10/22/20  Nolberto Hanlon, MD  potassium chloride 20 MEQ/15ML (10%) SOLN  Take 15 mLs (20 mEq total) by mouth 2 (two) times daily. 12/24/20   Jacquelin Hawking, NP  potassium chloride SA (KLOR-CON) 20 MEQ tablet TAKE 1 TABLET BY MOUTH TWICE DAILY 11/30/20   Lloyd Huger, MD  prochlorperazine (COMPAZINE) 10 MG tablet Take 10 mg by mouth 2 (two) times daily as needed for nausea or vomiting.    [provider]  XARELTO 20 MG TABS tablet TAKE 1 TABLET BY MOUTH DAILY WITH SUPPER. START ON FINAL FULL DAY OF LOVENOX INJECTIONS. 12/02/20   Lloyd Huger, MD    Allergies  Allergen Reactions   Erythromycin Itching    Family History  Problem Relation Age of Onset   Stroke Father    Diabetes Maternal Grandmother    Hypertension Maternal Grandmother    Diabetes Maternal Uncle    Cancer Maternal Aunt        Breast    Social History Social History   Tobacco Use   Smoking status: Never   Smokeless tobacco: Never  Vaping Use   Vaping Use: Never used  Substance Use Topics   Alcohol use: No    Alcohol/week: 0.0 standard drinks   Drug use: No    Review of Systems Constitutional: Negative for fever.  Increased fatigue. Cardiovascular: Negative for chest pain. Respiratory: Negative for shortness of breath. Gastrointestinal: Negative for abdominal pain, vomiting  Musculoskeletal: Negative for musculoskeletal complaints Skin: Negative for skin complaints  Neurological: Negative for headache All other ROS negative  ____________________________________________   PHYSICAL EXAM:  VITAL SIGNS: ED Triage Vitals  Enc Vitals Group     BP 01/03/21 1551 (!) 131/106     Pulse Rate 01/03/21 1551 (!) 164     Resp 01/03/21 1551 16     Temp 01/03/21 1551 97.7 F (36.5 C)     Temp Source 01/03/21 1551 Oral     SpO2 01/03/21 1551 97 %     Weight 01/03/21 1546 189 lb 9.5 oz (86 kg)     Height 01/03/21 1546 5\' 6"  (1.676 m)     Head Circumference --      Peak Flow --      Pain Score 01/03/21 1546 0     Pain Loc --      Pain Edu? --      Excl. in  [place]? --    Constitutional: Alert and oriented.  No acute distress.  Calm and cooperative.  Somewhat slow responses. Eyes: Normal exam ENT      Head: Normocephalic and atraumatic.      Mouth/Throat: Mucous membranes are moist. Cardiovascular: Patient has an irregular rhythm with a rate around 150 to 170 bpm Respiratory: Normal respiratory effort without tachypnea nor retractions. Breath sounds are clear without obvious wheeze rales or rhonchi Gastrointestinal: Soft and nontender. No distention.   Musculoskeletal: Nontender with normal range of motion in all extremities.  Neurologic:  Normal speech and language. No gross focal neurologic deficits Skin:  Skin is warm, dry and intact.  Psychiatric: Mood and affect are normal.  ____________________________________________    EKG  EKG viewed and interpreted by myself shows atrial fibrillation with at 158 bpm with a narrow QRS, normal axis, normal intervals nonspecific ST changes, no ST elevation.  ____________________________________________    DHRCBULAG  I reviewed the CTA images no obvious pulmonary embolism.  Currently awaiting radiology review. I reviewed CT head imaging, no obvious bleed, awaiting radiology review ____________________________________________   INITIAL IMPRESSION / ASSESSMENT AND PLAN / ED COURSE  Pertinent labs & imaging results that were available during my care of the patient were reviewed by me and considered in my medical decision making (see chart for details).   Patient presents emergency department for a fall patient is on Xarelto for prior PE.  Patient found to be in atrial fibrillation with rapid ventricular response around 170 bpm.  No history of A. fib previously per patient.  EMS reports family states 3 days of decreased appetite and fatigue.  We will dose IV fluids as the patient is currently hypotensive.  We will check labs obtain a CTA of the chest to rule out new PE.  Patient is mentating well I  believe we can perform a work-up in the emergency department, if the patient were to deteriorate and she is already anticoagulated and would be a candidate for possible cardioversion if needed.  His blood pressure is stabilizing currently 106/73.  Patient receiving her second liter of IV fluids currently.  White blood cell count is 1.9 although the patient is currently on chemotherapy.  Anion gap of 19 indicates likely fairly significant dehydration.  We will continue with IV fluids.  Heart rate is slowed currently 125-140 bpm.  We will start the patient on diltiazem.  Patient's potassium is 2.8 will begin potassium replacement.  Patient will require admission to the hospital service for further work-up and treatment.  Patient has spontaneously converted back to a normal sinus rhythm prior to diltiazem infusion.  We will still admit to the hospital service given the patient's weakness fairly significant dehydration initial hypotension and hypokalemia.  Judith Ross was evaluated in Emergency Department on 01/03/2021 for the symptoms described in the history of present illness. She was evaluated in the context of the global COVID-19 pandemic, which necessitated consideration that the patient might be at risk for infection with the SARS-CoV-2 virus that causes COVID-19. Institutional protocols and algorithms that pertain to the evaluation of patients at risk for COVID-19 are in a state of rapid change based on information released by regulatory bodies including the CDC and federal and state organizations. These policies and algorithms were followed during the patient's care in the ED.  CRITICAL CARE Performed by: Harvest Dark   Total critical care time: 30 minutes  Critical care time was exclusive of separately billable procedures and treating other patients.  Critical care was necessary to treat or prevent imminent or life-threatening deterioration.  Critical care was time spent personally  by me on the following activities: development of treatment plan with patient and/or surrogate as well as nursing, discussions with consultants, evaluation of patient's response to treatment, examination of patient, obtaining history from patient or surrogate, ordering and performing treatments and interventions, ordering and review of laboratory studies, ordering and review of radiographic studies, pulse oximetry and re-evaluation of patient's condition.    Repeat EKG viewed and interpreted by myself shows a normal sinus rhythm 86 bpm with a narrow QRS, normal axis, largely normal intervals with slight QTC prolongation, no significant ST changes. ____________________________________________   FINAL CLINICAL IMPRESSION(S) /  ED DIAGNOSES  New onset atrial fibrillation with rapid ventricular response Hypotension Tachycardia Hypokalemia Dehydration   Harvest Dark, MD 01/03/21 1746    Harvest Dark, MD 01/03/21 [phone]

## 2021-01-03 NOTE — ED Notes (Signed)
Gave critical lactate to Harvest Forest MD

## 2021-01-03 NOTE — ED Notes (Signed)
Dr Kerman Passey notified bp. Fluids ordered and starte.d

## 2021-01-03 NOTE — Progress Notes (Deleted)
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  INTERVAL HISTORY: Patient returns to clinic today for further evaluation and consideration of cycle 8 of FOLFIRI plus Avastin.  She continues to have a poor appetite, but only took 1 day of prescribed dexamethasone.  She continues to have weakness and fatigue.  She has no neurologic complaints.  She denies any recent fevers or illnesses.  She denies any chest pain, shortness of breath, cough, hemoptysis.  She denies any nausea, vomiting, constipation, or diarrhea.  She has no melena or hematochezia.  She has no urinary complaints.  She has no further leg pain.  Patient offers no further specific complaints today.  REVIEW OF SYSTEMS:   Review of Systems  Constitutional:  Positive for malaise/fatigue. Negative for fever and weight loss.  Respiratory: Negative.  Negative for cough, hemoptysis and shortness of breath.   Cardiovascular: Negative.  Negative for chest pain and leg swelling.  Gastrointestinal: Negative.  Negative for abdominal pain, blood in stool, constipation, diarrhea, melena, nausea and vomiting.  Genitourinary: Negative.  Negative for dysuria.  Musculoskeletal: Negative.  Negative for back pain.  Skin: Negative.  Negative for rash.  Neurological:  Positive for weakness. Negative for dizziness, focal weakness and headaches.  Psychiatric/Behavioral: Negative.  The patient is not nervous/anxious.    As per HPI. Otherwise, a complete review of systems is negative.  PAST MEDICAL HISTORY: Past Medical History:  Diagnosis Date   Arthritis     Chicken pox    Helicobacter pylori gastritis    Hyperlipidemia    Hypertension    Metastatic colon cancer in female [facility])    Obesity    Sleep apnea    Thyroid disease     PAST SURGICAL HISTORY: Past Surgical History:  Procedure Laterality Date   ABDOMINAL HYSTERECTOMY  12/2009   total   COLONOSCOPY WITH PROPOFOL N/A 08/13/2020   Procedure: COLONOSCOPY WITH PROPOFOL;  Surgeon: Jonathon Bellows, MD;  Location: [facility] ENDOSCOPY;  Service: Gastroenterology;  Laterality: N/A;   ESOPHAGOGASTRODUODENOSCOPY (EGD) WITH PROPOFOL N/A 08/13/2020   Procedure: ESOPHAGOGASTRODUODENOSCOPY (EGD) WITH PROPOFOL;  Surgeon: Jonathon Bellows, MD;  Location: [facility] ENDOSCOPY;  Service: Gastroenterology;  Laterality: N/A;   IR IMAGING GUIDED PORT INSERTION  08/27/2020   supracervical abdominal hysterectomy and bilateral salpingo--oophorectomy 01-17-2010 for fibroids  01/17/2010   TOTAL THYROIDECTOMY  1991-92   TUBAL LIGATION      FAMILY HISTORY: Family History  Problem Relation Age of Onset   Stroke Father    Diabetes Maternal Grandmother    Hypertension Maternal Grandmother    Diabetes Maternal Uncle    Cancer Maternal Aunt        Breast    ADVANCED DIRECTIVES (Y/N):  N  HEALTH MAINTENANCE: Social History   Tobacco Use   Smoking status: Never   Smokeless tobacco: Never  Vaping Use   Vaping Use: Never used  Substance Use Topics   Alcohol use: No    Alcohol/week: 0.0 standard drinks   Drug use: No     Colonoscopy:  PAP:  Bone density:  Lipid panel:  Allergies  Allergen Reactions   Erythromycin Itching    No current facility-administered medications for this visit.  Current Outpatient Medications  Medication Sig Dispense Refill   atorvastatin (LIPITOR) 10 MG tablet TAKE 1 TABLET(10 MG) BY MOUTH DAILY 90 tablet 0   clobetasol ointment (TEMOVATE) 0.05 % Apply to affected area every night for 4 weeks, then every other day for 4 weeks and then twice a week for 4 weeks or until resolution.  (Patient not taking: No sig reported) 30 g 5   dexamethasone (DECADRON) 4 MG tablet Take 1 tablet (4 mg total) by mouth daily. (Patient not taking: Reported on 01/03/2021) 30 tablet 0   enoxaparin (LOVENOX) 100 MG/ML injection Inject 1 mL (100 mg total) into the skin every 12 (twelve) hours. (Patient not taking: No sig reported) 20 mL 0   feeding supplement (ENSURE ENLIVE / ENSURE PLUS) LIQD Take 237 mLs by mouth 3 (three) times daily between meals. 237 mL 12   levothyroxine (SYNTHROID) 150 MCG tablet Take 1 tablet (150 mcg total) by mouth daily. 90 tablet 3   oxyCODONE-acetaminophen (PERCOCET/ROXICET) 5-325 MG tablet Take 1 tablet by mouth every 4 (four) hours as needed for severe pain. (Patient not taking: No sig reported) 30 tablet 0   pantoprazole (PROTONIX) 40 MG tablet Take 1 tablet (40 mg total) by mouth 2 (two) times daily. (Patient not taking: Reported on 11/16/2020) 60 tablet 0   potassium chloride 20 MEQ/15ML (10%) SOLN Take 15 mLs (20 mEq total) by mouth 2 (two) times daily. (Patient not taking: Reported on 01/03/2021) 473 mL 0   potassium chloride SA (KLOR-CON) 20 MEQ tablet TAKE 1 TABLET BY MOUTH TWICE DAILY (Patient not taking: Reported on 01/03/2021) 180 tablet 1   prochlorperazine (COMPAZINE) 10 MG tablet Take 10 mg by mouth 2 (two) times daily as needed for nausea or vomiting.     XARELTO 20 MG TABS tablet TAKE 1 TABLET BY MOUTH DAILY WITH SUPPER. START ON FINAL FULL DAY OF LOVENOX INJECTIONS. 30 tablet 3   Facility-Administered Medications Ordered in Other Visits  Medication Dose Route Frequency Provider Last Rate Last Admin   acetaminophen (TYLENOL) tablet 650 mg  650 mg Oral Q6H PRN Tacey Ruiz, MD       Or   acetaminophen (TYLENOL) suppository 650 mg  650 mg Rectal Q6H PRN Tacey Ruiz, MD       atorvastatin (LIPITOR) tablet 10 mg  10 mg Oral QHS Tacey Ruiz, MD       bisacodyl (DULCOLAX) EC tablet 5 mg  5 mg Oral Daily PRN Tacey Ruiz, MD       diltiazem  (CARDIZEM) 125 mg in dextrose 5% 125 mL (1 mg/mL) infusion  5-15 mg/hr Intravenous Continuous Harvest Dark, MD       diltiazem (CARDIZEM) injection 10 mg  10 mg Intravenous Q6H PRN Tacey Ruiz, MD       hydrALAZINE (APRESOLINE) injection 10 mg  10 mg Intravenous Q6H PRN Tacey Ruiz, MD       HYDROcodone-acetaminophen (NORCO/VICODIN) 5-325 MG per tablet 1-2 tablet  1-2 tablet Oral Q4H PRN Tacey Ruiz, MD       lactated ringers infusion   Intravenous Continuous Tacey Ruiz, MD       Derrill Memo ON 01/04/2021] levothyroxine (SYNTHROID) tablet 150 mcg  150 mcg Oral Q0600 Tacey Ruiz, MD       metoprolol tartrate (LOPRESSOR) injection 5 mg  5 mg Intravenous Q6H PRN Tacey Ruiz, MD       morphine 2 MG/ML injection 2 mg  2 mg Intravenous Q2H PRN Tacey Ruiz, MD  ondansetron (ZOFRAN) tablet 4 mg  4 mg Oral Q6H PRN Tacey Ruiz, MD       Or   ondansetron (ZOFRAN) injection 4 mg  4 mg Intravenous Q6H PRN Tacey Ruiz, MD       Derrill Memo ON 01/04/2021] potassium chloride (KLOR-CON) packet 40 mEq  40 mEq Oral BID Tacey Ruiz, MD       potassium chloride SA (KLOR-CON) CR tablet 40 mEq  40 mEq Oral Once Harvest Dark, MD       rivaroxaban Alveda Reasons) tablet 20 mg  20 mg Oral Q supper Tacey Ruiz, MD       senna-docusate (Senokot-S) tablet 1 tablet  1 tablet Oral QHS PRN Tacey Ruiz, MD        OBJECTIVE: There were no vitals filed for this visit.    There is no height or weight on file to calculate BMI.    ECOG FS:0 - Asymptomatic  General: Well-developed, well-nourished, no acute distress. Eyes: Pink conjunctiva, anicteric sclera. HEENT: Normocephalic, moist mucous membranes. Lungs: No audible wheezing or coughing. Heart: Regular rate and rhythm. Abdomen: Soft, nontender, no obvious distention. Musculoskeletal: No edema, cyanosis, or clubbing. Neuro: Alert, answering all questions appropriately. Cranial nerves grossly intact. Skin: No rashes or  petechiae noted. Psych: Normal affect.  LAB RESULTS:  Lab Results  Component Value Date   NA 137 01/03/2021   K 2.8 (L) 01/03/2021   CL 92 (L) 01/03/2021   CO2 26 01/03/2021   GLUCOSE 111 (H) 01/03/2021   BUN 8 01/03/2021   CREATININE 0.93 01/03/2021   CALCIUM 8.3 (L) 01/03/2021   PROT 7.1 01/03/2021   ALBUMIN 3.0 (L) 01/03/2021   AST 19 01/03/2021   ALT 14 01/03/2021   ALKPHOS 90 01/03/2021   BILITOT 1.8 (H) 01/03/2021   GFRNONAA >60 01/03/2021   GFRAA  01/10/2010    >60        The eGFR has been calculated using the MDRD equation. This calculation has not been validated in all clinical situations. eGFR's persistently <60 mL/min signify possible Chronic Kidney Disease.    Lab Results  Component Value Date   WBC 1.9 (L) 01/03/2021   NEUTROABS 3.5 12/22/2020   HGB 12.5 01/03/2021   HCT 36.9 01/03/2021   MCV 87.2 01/03/2021   PLT 128 (L) 01/03/2021     STUDIES: CT HEAD WO CONTRAST (5MM)  Result Date: 01/03/2021 CLINICAL DATA:  Head trauma fall EXAM: CT HEAD WITHOUT CONTRAST TECHNIQUE: Contiguous axial images were obtained from the base of the skull through the vertex without intravenous contrast. COMPARISON:  None. FINDINGS: Brain: No acute territorial infarction, or hemorrhage. Possible small extra-axial mass at the left parietal vertex with calcification, sagittal series 5, image 32, measuring 8 mm, probably representing meningioma. The ventricles are non enlarged. Vascular: No hyperdense vessels.  No unexpected calcification Skull: Normal. Negative for fracture or focal lesion. Sinuses/Orbits: No acute finding. Other: None IMPRESSION: 1. No CT evidence for acute intracranial abnormality. 2. Suspicion of small extra-axial mass at the left parietal vertex, which may represent small meningioma Electronically Signed   By: Donavan Foil M.D.   On: 01/03/2021 17:49   CT Angio Chest PE W and/or Wo Contrast  Result Date: 01/03/2021 CLINICAL DATA:  Weakness AFib EXAM: CT  ANGIOGRAPHY CHEST WITH CONTRAST TECHNIQUE: Multidetector CT imaging of the chest was performed using the standard protocol during bolus administration of intravenous contrast. Multiplanar CT image reconstructions and MIPs were obtained to evaluate the vascular anatomy. CONTRAST:  46m OMNIPAQUE IOHEXOL  350 MG/ML SOLN COMPARISON:  CT 11/23/2020, chest CT 09/18/2020 FINDINGS: Cardiovascular: Satisfactory opacification of the pulmonary arteries to the segmental level. Negative for acute filling defect within the main, lobar or segmental pulmonary vessels. Hypoenhancement right lower lobe subsegmental arteries, for example series 7, image 24. The vessels appear irregular and attenuated in caliber compared to the prior CTA and suspect that findings are likely related to chronic PE. Negative for pericardial effusion. Borderline cardiomegaly. Mediastinum/Nodes: Midline trachea. No thyroid mass. Postsurgical changes of the left lobe. No suspicious adenopathy. Esophagus within normal limits Lungs/Pleura: Small right-sided pleural effusion. Mild ground-glass density in the right lung base, could be secondary to mosaic perfusion. Upper Abdomen: No acute abnormality. Mild stranding in left upper quadrant omentum without change. Musculoskeletal: Incompletely visualized nodularity within the subareolar left breast, without significant change Review of the MIP images confirms the above findings. IMPRESSION: 1. No definite evidence for acute main, lobar, or segmental pulmonary embolus. Poor enhancement of right lower lobe subsegmental vessels with overall attenuated caliber of vessels, suspect that findings are related to sequela of chronic PE given findings on prior angiography. 2. Small right-sided pleural effusion Electronically Signed   By: Donavan Foil M.D.   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  PLAN:    1.  Stage IV adenocarcinoma of the colon with  peritoneal carcinomatosis: Repeat CT scan results from November 23, 2020 reviewed independently and reported as above with no evidence of disease in patient's chest and improved omental nodularity in her abdomen.  Pretreatment CEA was 313, which is now improved to 21.6.  Patient completed cycle 2 of FOLFOX plus Avastin, but then had a delayed reaction possibly secondary to oxaliplatin.  Oxaliplatin has been discontinued.  Proceed with cycle 8 of FOLFIRI plus Avastin today.  Continue Ziextenzo with pump off for the remainder of the cycles given her history of neutropenia.  Return to clinic in 2 days for pump removal and IV potassium and then in 2 weeks for further evaluation and consideration of cycle return to clinic in 1 week for further evaluation and reconsideration of cycle 9.   2.  Abdominal pain: Resolved  Continue Percocet as needed. 3.  Renal insufficiency: Resolved. 4.  Hypokalemia: Remains significantly reduced.  Proceed with 40 meq IC potassium today and an addition 40 meq on Friday with pump removal. Continue oral potassium supplementation.  5.  Nausea and vomiting: Resolved.  Continue antiemetics as prescribed. 6.  Poor appetite: Chronic and unchanged.  Patient did not initiate oral dexamethasone as prescribed.  She declined additional evaluation from dietary. 7.  Neutropenia: Resolved.  Proceed with treatment and continue Ziextenzo with pump removal for subsequent treatments. 8.  PE/DVT: Patient noted to have new onset symptoms in her right lower extremity which revealed a large DVT while taking Eliquis. 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  9. Anemia: Resolved. 10. Poor appetite: Encouraged compliance with dexamethasone.  Patient expressed understanding and was in agreement with this plan. She also understands that She can call clinic at any time with any questions, concerns, or complaints.    Cancer Staging Adenocarcinoma of transverse colon [facility]) Staging form: Colon and Rectum - Neuroendocine Tumors, AJCC 8th Edition - Clinical stage from 08/18/2020: Stage IV (cTX, cNX, pM1b) - Signed by Lloyd Huger, MD on 08/18/2020 Stage prefix: Initial diagnosis   Kathlene November  Grayland Ormond, MD   01/03/2021 10:42 PM

## 2021-01-03 NOTE — ED Notes (Signed)
R smith paged for critical value.

## 2021-01-04 ENCOUNTER — Inpatient Hospital Stay: Payer: Medicare Other

## 2021-01-04 ENCOUNTER — Inpatient Hospital Stay: Payer: Medicare Other | Admitting: Oncology

## 2021-01-04 ENCOUNTER — Telehealth: Payer: Self-pay | Admitting: Internal Medicine

## 2021-01-04 DIAGNOSIS — Z91128 Patient's intentional underdosing of medication regimen for other reason: Secondary | ICD-10-CM | POA: Diagnosis not present

## 2021-01-04 DIAGNOSIS — E860 Dehydration: Secondary | ICD-10-CM | POA: Diagnosis present

## 2021-01-04 DIAGNOSIS — C184 Malignant neoplasm of transverse colon: Secondary | ICD-10-CM | POA: Diagnosis present

## 2021-01-04 DIAGNOSIS — T503X6A Underdosing of electrolytic, caloric and water-balance agents, initial encounter: Secondary | ICD-10-CM | POA: Diagnosis present

## 2021-01-04 DIAGNOSIS — E876 Hypokalemia: Principal | ICD-10-CM

## 2021-01-04 DIAGNOSIS — D849 Immunodeficiency, unspecified: Secondary | ICD-10-CM | POA: Diagnosis present

## 2021-01-04 DIAGNOSIS — E162 Hypoglycemia, unspecified: Secondary | ICD-10-CM | POA: Diagnosis present

## 2021-01-04 DIAGNOSIS — I472 Ventricular tachycardia, unspecified: Secondary | ICD-10-CM | POA: Diagnosis present

## 2021-01-04 DIAGNOSIS — D329 Benign neoplasm of meninges, unspecified: Secondary | ICD-10-CM | POA: Diagnosis present

## 2021-01-04 DIAGNOSIS — B9620 Unspecified Escherichia coli [E. coli] as the cause of diseases classified elsewhere: Secondary | ICD-10-CM | POA: Diagnosis present

## 2021-01-04 DIAGNOSIS — R197 Diarrhea, unspecified: Secondary | ICD-10-CM | POA: Diagnosis present

## 2021-01-04 DIAGNOSIS — I4891 Unspecified atrial fibrillation: Secondary | ICD-10-CM | POA: Diagnosis present

## 2021-01-04 DIAGNOSIS — I4729 Other ventricular tachycardia: Secondary | ICD-10-CM

## 2021-01-04 DIAGNOSIS — R630 Anorexia: Secondary | ICD-10-CM | POA: Diagnosis present

## 2021-01-04 DIAGNOSIS — Z20822 Contact with and (suspected) exposure to covid-19: Secondary | ICD-10-CM | POA: Diagnosis present

## 2021-01-04 DIAGNOSIS — Z9221 Personal history of antineoplastic chemotherapy: Secondary | ICD-10-CM | POA: Diagnosis not present

## 2021-01-04 DIAGNOSIS — G8929 Other chronic pain: Secondary | ICD-10-CM | POA: Diagnosis present

## 2021-01-04 DIAGNOSIS — Z86711 Personal history of pulmonary embolism: Secondary | ICD-10-CM

## 2021-01-04 DIAGNOSIS — E785 Hyperlipidemia, unspecified: Secondary | ICD-10-CM | POA: Diagnosis present

## 2021-01-04 DIAGNOSIS — Z7901 Long term (current) use of anticoagulants: Secondary | ICD-10-CM

## 2021-01-04 DIAGNOSIS — N390 Urinary tract infection, site not specified: Secondary | ICD-10-CM | POA: Diagnosis present

## 2021-01-04 DIAGNOSIS — I959 Hypotension, unspecified: Secondary | ICD-10-CM | POA: Diagnosis present

## 2021-01-04 DIAGNOSIS — E890 Postprocedural hypothyroidism: Secondary | ICD-10-CM | POA: Diagnosis present

## 2021-01-04 DIAGNOSIS — Z86718 Personal history of other venous thrombosis and embolism: Secondary | ICD-10-CM | POA: Diagnosis not present

## 2021-01-04 DIAGNOSIS — I10 Essential (primary) hypertension: Secondary | ICD-10-CM | POA: Diagnosis present

## 2021-01-04 LAB — BASIC METABOLIC PANEL
Anion gap: 16 — ABNORMAL HIGH (ref 5–15)
BUN: 7 mg/dL — ABNORMAL LOW (ref 8–23)
CO2: 26 mmol/L (ref 22–32)
Calcium: 7.7 mg/dL — ABNORMAL LOW (ref 8.9–10.3)
Chloride: 97 mmol/L — ABNORMAL LOW (ref 98–111)
Creatinine, Ser: 0.75 mg/dL (ref 0.44–1.00)
GFR, Estimated: >60 mL/min (ref >60)
Glucose, Bld: 73 mg/dL (ref 70–99)
Potassium: 2.4 mmol/L — CL (ref 3.5–5.1)
Sodium: 139 mmol/L (ref 135–145)

## 2021-01-04 LAB — GASTROINTESTINAL PANEL BY PCR, STOOL (REPLACES STOOL CULTURE)

## 2021-01-04 LAB — RESP PANEL BY RT-PCR (FLU A&B, COVID) ARPGX2
Influenza A by PCR: NEGATIVE
Influenza B by PCR: NEGATIVE
SARS Coronavirus 2 by RT PCR: NEGATIVE

## 2021-01-04 LAB — CBC
HCT: 35.7 % — ABNORMAL LOW (ref 36.0–46.0)
Hemoglobin: 11.9 g/dL — ABNORMAL LOW (ref 12.0–15.0)
MCH: 29.5 pg (ref 26.0–34.0)
MCHC: 33.3 g/dL (ref 30.0–36.0)
MCV: 88.4 fL (ref 80.0–100.0)
Platelets: 143 10*3/uL — ABNORMAL LOW (ref 150–400)
RBC: 4.04 MIL/uL (ref 3.87–5.11)
RDW: 19.9 % — ABNORMAL HIGH (ref 11.5–15.5)
WBC: 3.4 10*3/uL — ABNORMAL LOW (ref 4.0–10.5)
nRBC: 1.2 % — ABNORMAL HIGH (ref 0.0–0.2)

## 2021-01-04 LAB — T4, FREE: Free T4: 0.98 ng/dL (ref 0.61–1.12)

## 2021-01-04 LAB — C DIFFICILE QUICK SCREEN W PCR REFLEX
C Diff antigen: NEGATIVE
C Diff interpretation: NOT DETECTED
C Diff toxin: NEGATIVE

## 2021-01-04 LAB — TSH: TSH: 7.858 u[IU]/mL — ABNORMAL HIGH (ref 0.350–4.500)

## 2021-01-04 LAB — LACTIC ACID, PLASMA: Lactic Acid, Venous: 2.3 mmol/L (ref 0.5–1.9)

## 2021-01-04 LAB — MAGNESIUM: Magnesium: 1.6 mg/dL — ABNORMAL LOW (ref 1.7–2.4)

## 2021-01-04 MED ORDER — METOPROLOL SUCCINATE ER 25 MG PO TB24
25.0000 mg | ORAL_TABLET | Freq: Every day | ORAL | Status: DC
  Administered 2021-01-04 – 2021-01-11 (×8): 25 mg via ORAL
  Filled 2021-01-04 (×8): qty 1

## 2021-01-04 MED ORDER — MAGNESIUM SULFATE 2 GM/50ML IV SOLN
2.0000 g | Freq: Once | INTRAVENOUS | Status: AC
  Administered 2021-01-04: 2 g via INTRAVENOUS
  Filled 2021-01-04: qty 50

## 2021-01-04 MED ORDER — POTASSIUM CHLORIDE 10 MEQ/100ML IV SOLN
10.0000 meq | INTRAVENOUS | Status: AC
  Administered 2021-01-04 (×3): 10 meq via INTRAVENOUS
  Filled 2021-01-04 (×3): qty 100

## 2021-01-04 MED ORDER — POTASSIUM CHLORIDE 10 MEQ/100ML IV SOLN
10.0000 meq | INTRAVENOUS | Status: AC
Start: 2021-01-04 — End: 2021-01-04
  Administered 2021-01-04 (×4): 10 meq via INTRAVENOUS
  Filled 2021-01-04 (×3): qty 100
  Filled 2021-01-04: qty 200
  Filled 2021-01-04: qty 100

## 2021-01-04 NOTE — ED Notes (Signed)
Critical lab verbalized to Dr. Tamala Julian. Orders to follow.

## 2021-01-04 NOTE — ED Notes (Signed)
Sent med message to pharmacy re: missing potassium IVPB. They will send.

## 2021-01-04 NOTE — ED Notes (Signed)
Sister at bedside.

## 2021-01-04 NOTE — Progress Notes (Signed)
PROGRESS NOTE  DYNA FIGUEREO XQJ:[id number] DOB: 06/06/1955 DOA: 01/03/2021 PCP: Venia Carbon, MD   LOS: 0 days   Brief narrative:  Judith Ross is a 65 y.o. female with past medical history of colon cancer undergoing chemotherapy, history of pulmonary embolism on Xarelto, hypothyroidism, hypertension, hyperlipidemia presented to the ED with generalized weakness, dizziness followed by a fall and did hit her head on the right side.  She had been feeling short of breath for a week without coughing or wheezing mostly short of breath on exertion.  Had been having some diarrhea for 2 weeks as outpatient.  In the ED, patient was noted to have hypokalemia.  She had stopped taking her potassium pills at home since the pills were big.  EKG showed new onset atrial fibrillation with rapid ventricular response.  She was given IV Cardizem and before she was started on Cardizem drip her rhythm converted.  She did have pulm management and elevated lactate as well.  She also had episode of ventricular tachycardia which was nonsustained.  CT head scan did not show any acute findings but a small lesion thought to be meningioma.  CT of the chest showed no evidence of pulmonary embolism but small right pleural effusion.  Patient was then considered for admission to the hospital.   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  Spontaneously converted without Cardizem drip.  I have started on low-dose metoprolol at this time.  We will check 2D echocardiogram.  Continue to replenish electrolytes including potassium and magnesium.  TSH was elevated.   Continue anticoagulation with Xarelto for history of pulmonary embolism.  I spoke with cardiology on-call Dr. Saunders Revel who recommended outpatient follow-up since the patient spontaneously converted.  He is going to arrange for outpatient follow-up on discharge.      Nonsustained ventricular tachycardia 1 episode.  Likely secondary to electrolyte disarray.  We will continue monitor closely.  Continue telemetry monitor.        Adenocarcinoma of transverse colon  Patient is undergoing chemotherapy as outpatient.  Plan to follow-up after discharge.     Meningioma  Noted on the head CT scan.  Will need outpatient follow-up.     History of pulmonary embolism  Continue Xarelto from outpatient.     Significant hypokalemia.  Continue to aggressively replenish through IV and orally.  Replenish magnesium sulfate as well.  Check levels in a.m.    Diarrhea Patient is undergoing chemotherapy.  Immunocompromised.  Likely causing severe hypokalemia.  C. difficile is negative.  Stool culture has been sent.  We will add GI pathogen panel.  We will add influenza and COVID test as well.    Lightheadedness, generalized weakness. Likely secondary to volume depletion, electrolyte imbalance.  Continue IV fluid hydration.  We will get PT evaluation.     Fall at home, initial encounter Secondary to lightheadedness and volume depletion.  We will get PT evaluation.     Hypothyroidism, acquired  Continue home Synthroid.  TSH is elevated.  Free T4 within normal range.     Suprapubic tenderness Secondary to colon cancer.  UA with less than 5 white cells.  No urinary symptoms  except suprapubic tenderness.. Urinary culture was sent on admission  DVT prophylaxis: SCDs Start: 01/03/21 2205 Place and maintain sequential compression device Start: 01/03/21 1903 rivaroxaban (XARELTO) tablet 20 mg    Code Status: Full code  Family Communication: None  Status is: Observation  The patient will require care spanning > 2  midnights and should be moved to inpatient because: Multiple electrolyte imbalances,, falls, generalized weakness requiring IV fluids electrolyte replacement and PT evaluation.  Consultants: Verbally consulted with cardiology.  Procedures: None  Anti-infectives:  None  Anti-infectives (From admission, onward)    None      Subjective: Today, patient was seen and examined at bedside.  Complains of generalized weakness fatigue and tiredness.  Has not gotten out of the bed.  Denies any chest pain or increasing shortness of breath at the time of my evaluation  Objective: Vitals:   01/04/21 1200 01/04/21 1330  BP: 128/79 106/70  Pulse: 68 68  Resp: 15 17  Temp:    SpO2: 93% 94%    Intake/Output Summary (Last 24 hours) at 01/04/2021 1615 Last data filed at 01/04/2021 1001 Gross per 24 hour  Intake 1000 ml  Output --  Net 1000 ml   Filed Weights   01/03/21 1546  Weight: 86 kg   Body mass index is 30.6 kg/m.   Physical Exam: GENERAL: Patient is alert awake and oriented. Not in obvious distress. HENT: No scleral pallor or icterus. Pupils equally reactive to light. Oral mucosa is mildly dry. NECK: is supple, no gross swelling noted. CHEST:   Diminished breath sounds bilaterally. CVS: S1 and S2 heard, no murmur. Regular rate and rhythm at the time of my examination..  ABDOMEN: Soft, nonspecific tenderness over the suprapubic area.  Bowel sounds are present. EXTREMITIES: No edema. CNS: Cranial nerves are intact. No focal motor deficits. SKIN: warm and dry without rashes.  Data Review: I have personally reviewed the following laboratory data and studies,  CBC: Recent Labs  Lab 01/03/21 1556 01/04/21 0250  WBC 1.9* 3.4*  HGB 12.5 11.9*  HCT 36.9 35.7*  MCV 87.2 88.4  PLT 128* 662*   Basic Metabolic Panel: Recent Labs  Lab 01/03/21 1556 01/04/21 0250  NA 137 139  K 2.8* 2.4*  CL 92* 97*  CO2 26 26  GLUCOSE 111* 73  BUN 8 7*  CREATININE 0.93 0.75   CALCIUM 8.3* 7.7*  MG  --  1.6*   Liver Function Tests: Recent Labs  Lab 01/03/21 1556  AST 19  ALT 14  ALKPHOS 90  BILITOT 1.8*  PROT 7.1  ALBUMIN 3.0*   No results for input(s): LIPASE, AMYLASE in the last 168 hours. No results for input(s): AMMONIA in the last 168 hours. Cardiac Enzymes: No results for input(s): CKTOTAL, CKMB, CKMBINDEX, TROPONINI in the last 168 hours. BNP (last 3 results) Recent Labs    09/18/20 1819  BNP 360.0*    ProBNP (last 3 results) No results for input(s): PROBNP in the last 8760 hours.  CBG: No results for input(s): GLUCAP in the last 168 hours. Recent Results (from the past 240 hour(s))  CULTURE, BLOOD (ROUTINE X 2) w Reflex to ID Panel     Status: None (Preliminary result)   Collection Time: 01/03/21 10:15 PM   Specimen: BLOOD  Result Value Ref Range Status   Specimen Description BLOOD RIGHT FOREARM  Final   Special Requests   Final    BOTTLES DRAWN AEROBIC AND ANAEROBIC Blood Culture adequate volume   Culture  Final    NO GROWTH < 12 HOURS Performed at [facility], [address] [place], [place] [address]    Report Status PENDING  Incomplete  C Difficile Quick Screen w PCR reflex     Status: None   Collection Time: 01/04/21 12:29 PM   Specimen: Perirectal; Stool  Result Value Ref Range Status   C Diff antigen NEGATIVE NEGATIVE Final   C Diff toxin NEGATIVE NEGATIVE Final   C Diff interpretation No C. difficile detected.  Final    Comment: Performed at [facility], [address] [place], [place] [address]     Studies: CT HEAD WO CONTRAST (5MM)  Result Date: 01/03/2021 CLINICAL DATA:  Head trauma fall EXAM: CT HEAD WITHOUT CONTRAST TECHNIQUE: Contiguous axial images were obtained from the base of the skull through the vertex without intravenous contrast. COMPARISON:  None. FINDINGS: Brain: No acute territorial infarction, or hemorrhage. Possible small extra-axial mass at the left parietal vertex with  calcification, sagittal series 5, image 32, measuring 8 mm, probably representing meningioma. The ventricles are non enlarged. Vascular: No hyperdense vessels.  No unexpected calcification Skull: Normal. Negative for fracture or focal lesion. Sinuses/Orbits: No acute finding. Other: None IMPRESSION: 1. No CT evidence for acute intracranial abnormality. 2. Suspicion of small extra-axial mass at the left parietal vertex, which may represent small meningioma Electronically Signed   By: Donavan Foil M.D.   On: 01/03/2021 17:49   CT Angio Chest PE W and/or Wo Contrast  Result Date: 01/03/2021 CLINICAL DATA:  Weakness AFib EXAM: CT ANGIOGRAPHY CHEST WITH CONTRAST TECHNIQUE: Multidetector CT imaging of the chest was performed using the standard protocol during bolus administration of intravenous contrast. Multiplanar CT image reconstructions and MIPs were obtained to evaluate the vascular anatomy. CONTRAST:  10mL OMNIPAQUE IOHEXOL 350 MG/ML SOLN COMPARISON:  CT 11/23/2020, chest CT 09/18/2020 FINDINGS: Cardiovascular: Satisfactory opacification of the pulmonary arteries to the segmental level. Negative for acute filling defect within the main, lobar or segmental pulmonary vessels. Hypoenhancement right lower lobe subsegmental arteries, for example series 7, image 24. The vessels appear irregular and attenuated in caliber compared to the prior CTA and suspect that findings are likely related to chronic PE. Negative for pericardial effusion. Borderline cardiomegaly. Mediastinum/Nodes: Midline trachea. No thyroid mass. Postsurgical changes of the left lobe. No suspicious adenopathy. Esophagus within normal limits Lungs/Pleura: Small right-sided pleural effusion. Mild ground-glass density in the right lung base, could be secondary to mosaic perfusion. Upper Abdomen: No acute abnormality. Mild stranding in left upper quadrant omentum without change. Musculoskeletal: Incompletely visualized nodularity within the subareolar  left breast, without significant change Review of the MIP images confirms the above findings. IMPRESSION: 1. No definite evidence for acute main, lobar, or segmental pulmonary embolus. Poor enhancement of right lower lobe subsegmental vessels with overall attenuated caliber of vessels, suspect that findings are related to sequela of chronic PE given findings on prior angiography. 2. Small right-sided pleural effusion Electronically Signed   By: Donavan Foil M.D.   On: 01/03/2021 18:06     Flora Lipps, MD  [facility] 01/04/2021  If 7PM-7AM, please contact night-coverage

## 2021-01-04 NOTE — ED Notes (Signed)
Sent msg to pharmacy for missing Xarelto.

## 2021-01-04 NOTE — ED Notes (Signed)
Will start mag sulfate once can find pump channel.

## 2021-01-04 NOTE — Telephone Encounter (Signed)
-----   Message from Nelva Bush, MD sent at 01/04/2021 12:03 PM EDT ----- Regarding: New outpatient consultation Hello,  Ms. Landa presented to the ED with a-fib with RVR and spontaneously converted to sinus rhythm.  The hospitalist team is requesting outpatient consultation (we did not see her during her admission).  Could you arrange for her to be seen by any available provider in 1-2 weeks?  Thanks.  Gerald Stabs

## 2021-01-04 NOTE — Telephone Encounter (Signed)
LVM to schedule

## 2021-01-05 DIAGNOSIS — E876 Hypokalemia: Secondary | ICD-10-CM | POA: Diagnosis not present

## 2021-01-05 DIAGNOSIS — R197 Diarrhea, unspecified: Secondary | ICD-10-CM | POA: Diagnosis not present

## 2021-01-05 DIAGNOSIS — I4891 Unspecified atrial fibrillation: Secondary | ICD-10-CM | POA: Diagnosis not present

## 2021-01-05 LAB — CBC
HCT: 34.6 % — ABNORMAL LOW (ref 36.0–46.0)
Hemoglobin: 11.7 g/dL — ABNORMAL LOW (ref 12.0–15.0)
MCH: 29.3 pg (ref 26.0–34.0)
MCHC: 33.8 g/dL (ref 30.0–36.0)
MCV: 86.5 fL (ref 80.0–100.0)
Platelets: 153 10*3/uL (ref 150–400)
RBC: 4 MIL/uL (ref 3.87–5.11)
RDW: 20.5 % — ABNORMAL HIGH (ref 11.5–15.5)
WBC: 4.1 10*3/uL (ref 4.0–10.5)
nRBC: 0.5 % — ABNORMAL HIGH (ref 0.0–0.2)

## 2021-01-05 LAB — BASIC METABOLIC PANEL
Anion gap: 12 (ref 5–15)
BUN: <5 mg/dL — ABNORMAL LOW (ref 8–23)
CO2: 27 mmol/L (ref 22–32)
Calcium: 7.8 mg/dL — ABNORMAL LOW (ref 8.9–10.3)
Chloride: 97 mmol/L — ABNORMAL LOW (ref 98–111)
Creatinine, Ser: 0.51 mg/dL (ref 0.44–1.00)
GFR, Estimated: >60 mL/min (ref >60)
Glucose, Bld: 58 mg/dL — ABNORMAL LOW (ref 70–99)
Potassium: 2.5 mmol/L — CL (ref 3.5–5.1)
Sodium: 136 mmol/L (ref 135–145)

## 2021-01-05 LAB — URINE CULTURE: Culture: 20000 — AB

## 2021-01-05 LAB — MAGNESIUM: Magnesium: 1.9 mg/dL (ref 1.7–2.4)

## 2021-01-05 MED ORDER — MAGNESIUM SULFATE 2 GM/50ML IV SOLN
2.0000 g | Freq: Once | INTRAVENOUS | Status: AC
  Administered 2021-01-05: 2 g via INTRAVENOUS
  Filled 2021-01-05: qty 50

## 2021-01-05 MED ORDER — CIPROFLOXACIN HCL 500 MG PO TABS
500.0000 mg | ORAL_TABLET | Freq: Two times a day (BID) | ORAL | Status: DC
  Filled 2021-01-05: qty 1

## 2021-01-05 MED ORDER — CIPROFLOXACIN IN D5W 400 MG/200ML IV SOLN
400.0000 mg | Freq: Two times a day (BID) | INTRAVENOUS | Status: AC
  Administered 2021-01-05 – 2021-01-07 (×6): 400 mg via INTRAVENOUS
  Filled 2021-01-05 (×7): qty 200

## 2021-01-05 NOTE — ED Notes (Signed)
Pt refused to try to take cipro. Educated on indication for medication and attempted to crush and place in applesauce. Pt sticks tongue on applesauce and states "I can't take this."

## 2021-01-05 NOTE — ED Notes (Signed)
This RN to bedside to assess pt. Pt. Sleeping in bed, VS stable, call light in reach, NAD. Will continue to monitor. Will assess pt. When she wakes.

## 2021-01-05 NOTE — Progress Notes (Signed)
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 fib: w/ RVR. New onset. Continue on metorpolol, xarelto. Spontaneously converted w/o cardizem drip. Needs to f/u outpatient w/ cardio, Dr. Saunders Revel   Nonsustained ventricular tachycardia: 1 episode.  Likely secondary to electrolyte abnormalities.    Adenocarcinoma of transverse colon: chemo as an outpatient    Meningioma: noted on CT head. Will need outpatient f/u   Hx of pulmonary embolism: continue on xarelto    Hypokalemia: KCl repleated. Mg sulfate ordered as well   Diarrhea: etiology unclear. Likely etiology on severe hypokalemia. C. diff is neg. GI PCR panel is neg as well    Generalized weakness: PT/OT consulted.    Fall: at home. Likely secondary to volume depletion/dehydration. PT/OT consulted    Hypothyroidism: continue on home dose of synthroid    Suprapubic tenderness: likely secondary to UTI. Urine cx growing e. Coli. Started on IV cipro   DVT prophylaxis: xarelto  Code Status: full  Family Communication:  Disposition Plan: depends on PT/OT recs   Level of care: Med-Surg  Status is: Inpatient  Remains inpatient appropriate because: secondary severity of illness     Consultants:    Procedures:   Antimicrobials: cipro    Subjective: Pt c/o malaise   Objective: Vitals:   01/05/21 0130 01/05/21 0200 01/05/21 0230 01/05/21 0642  BP: 126/73 116/76 128/74 130/78  Pulse:   69 81  Resp: 16 17 17  (!) 81  Temp:      TempSrc:      SpO2:   94% 95%  Weight:      Height:         Intake/Output Summary (Last 24 hours) at 01/05/2021 0801 Last data filed at 01/04/2021 1618 Gross per 24 hour  Intake 2000 ml  Output --  Net 2000 ml   Filed Weights   01/03/21 1546  Weight: 86 kg    Examination:  General exam: Appears calm but uncomfortable  Respiratory system: Clear to auscultation. Respiratory effort normal. Cardiovascular system: S1 & S2 +. No rubs, gallops or clicks.  Gastrointestinal system: Abdomen is obese, soft and nontender. Normal bowel sounds heard. Central nervous system: Alert and oriented. Moves all extremities  Psychiatry: Judgement and insight appear normal. Flat mood and affect     Data Reviewed: I have personally reviewed following labs and imaging studies  CBC: Recent Labs  Lab 01/03/21 1556 01/04/21 0250 01/05/21 0658  WBC 1.9* 3.4* 4.1  HGB 12.5 11.9* 11.7*  HCT 36.9 35.7* 34.6*  MCV 87.2 88.4 86.5  PLT 128* 143* 784   Basic Metabolic Panel: Recent Labs  Lab 01/03/21 1556 01/04/21 0250 01/05/21 0658  NA 137 139 136  K 2.8* 2.4* 2.5*  CL 92* 97* 97*  CO2 26 26 27   GLUCOSE 111* 73 58*  BUN 8 7* <5*  CREATININE 0.93 0.75 0.51  CALCIUM 8.3* 7.7* 7.8*  MG  --  1.6* 1.9   GFR: Estimated Creatinine Clearance: 77.5 mL/min (by C-G formula based on SCr of 0.51 mg/dL). Liver Function Tests: Recent Labs  Lab 01/03/21  1556  AST 19  ALT 14  ALKPHOS 90  BILITOT 1.8*  PROT 7.1  ALBUMIN 3.0*   No results for input(s): LIPASE, AMYLASE in the last 168 hours. No results for input(s): AMMONIA in the last 168 hours. Coagulation Profile: Recent Labs  Lab 01/03/21 1556  INR 1.3*   Cardiac Enzymes: No results for input(s): CKTOTAL, CKMB, CKMBINDEX, TROPONINI in the last 168 hours. BNP (last 3 results) No results for input(s): PROBNP in the last 8760 hours. HbA1C: No results for input(s): HGBA1C in the last 72 hours. CBG: No results for input(s): GLUCAP in the last 168 hours. Lipid Profile: No results for input(s):  CHOL, HDL, LDLCALC, TRIG, CHOLHDL, LDLDIRECT in the last 72 hours. Thyroid Function Tests: Recent Labs    01/04/21 0250  TSH 7.858*  FREET4 0.98   Anemia Panel: No results for input(s): VITAMINB12, FOLATE, FERRITIN, TIBC, IRON, RETICCTPCT in the last 72 hours. Sepsis Labs: Recent Labs  Lab 01/03/21 2215 01/04/21 0250  LATICACIDVEN 2.2* 2.3*    Recent Results (from the past 240 hour(s))  CULTURE, BLOOD (ROUTINE X 2) w Reflex to ID Panel     Status: None (Preliminary result)   Collection Time: 01/03/21 10:15 PM   Specimen: BLOOD  Result Value Ref Range Status   Specimen Description BLOOD RIGHT FOREARM  Final   Special Requests   Final    BOTTLES DRAWN AEROBIC AND ANAEROBIC Blood Culture adequate volume   Culture   Final    NO GROWTH 2 DAYS Performed at [facility], [address] [place], [place] [address]    Report Status PENDING  Incomplete  Urine Culture     Status: Abnormal   Collection Time: 01/03/21 10:20 PM   Specimen: Urine, Random  Result Value Ref Range Status   Specimen Description   Final    URINE, RANDOM Performed at [facility], [address], [place], [place] [address]    Special Requests   Final    Immunocompromised Performed at [facility], [place], [place] [address]    Culture 20,000 COLONIES/mL ESCHERICHIA COLI (A)  Final   Report Status 01/05/2021 FINAL  Final   Organism ID, Bacteria ESCHERICHIA COLI (A)  Final      Susceptibility   Escherichia coli - MIC*    AMPICILLIN 4 SENSITIVE Sensitive     CEFAZOLIN <=4 SENSITIVE Sensitive     CEFEPIME <=0.12 SENSITIVE Sensitive     CEFTRIAXONE <=0.25 SENSITIVE Sensitive     CIPROFLOXACIN <=0.25 SENSITIVE Sensitive     GENTAMICIN <=1 SENSITIVE Sensitive     IMIPENEM <=0.25 SENSITIVE Sensitive     NITROFURANTOIN <=16 SENSITIVE Sensitive     TRIMETH/SULFA <=20 SENSITIVE Sensitive     AMPICILLIN/SULBACTAM <=2 SENSITIVE Sensitive     PIP/TAZO <=4 SENSITIVE  Sensitive     * 20,000 COLONIES/mL ESCHERICHIA COLI  C Difficile Quick Screen w PCR reflex     Status: None   Collection Time: 01/04/21 12:29 PM   Specimen: Perirectal; Stool  Result Value Ref Range Status   C Diff antigen NEGATIVE NEGATIVE Final   C Diff toxin NEGATIVE NEGATIVE Final   C Diff interpretation No C. difficile detected.  Final    Comment: Performed at [facility] [address] [place], [place] [address]  Gastrointestinal Panel by PCR , Stool     Status: None   Collection Time: 01/04/21 12:30 PM   Specimen: Stool  Result Value Ref Range Status  Campylobacter species NOT DETECTED NOT DETECTED Final   Plesimonas shigelloides NOT DETECTED NOT DETECTED Final   Salmonella species NOT DETECTED NOT DETECTED Final   Yersinia enterocolitica NOT DETECTED NOT DETECTED Final   Vibrio species NOT DETECTED NOT DETECTED Final   Vibrio cholerae NOT DETECTED NOT DETECTED Final   Enteroaggregative E coli (EAEC) NOT DETECTED NOT DETECTED Final   Enteropathogenic E coli (EPEC) NOT DETECTED NOT DETECTED Final   Enterotoxigenic E coli (ETEC) NOT DETECTED NOT DETECTED Final   Shiga like toxin producing E coli (STEC) NOT DETECTED NOT DETECTED Final   Shigella/Enteroinvasive E coli (EIEC) NOT DETECTED NOT DETECTED Final   Cryptosporidium NOT DETECTED NOT DETECTED Final   Cyclospora cayetanensis NOT DETECTED NOT DETECTED Final   Entamoeba histolytica NOT DETECTED NOT DETECTED Final   Giardia lamblia NOT DETECTED NOT DETECTED Final   Adenovirus F40/41 NOT DETECTED NOT DETECTED Final   Astrovirus NOT DETECTED NOT DETECTED Final   Norovirus GI/GII NOT DETECTED NOT DETECTED Final   Rotavirus A NOT DETECTED NOT DETECTED Final   Sapovirus (I, II, IV, and V) NOT DETECTED NOT DETECTED Final    Comment: Performed at [facility], [address] [place], [place] [address]  Resp Panel by RT-PCR (Flu A&B, Covid) Nasopharyngeal Swab     Status: None   Collection Time: 01/04/21   4:45 PM   Specimen: Nasopharyngeal Swab; Nasopharyngeal(NP) swabs in vial transport medium  Result Value Ref Range Status   SARS Coronavirus 2 by RT PCR NEGATIVE NEGATIVE Final    Comment: (NOTE) SARS-CoV-2 target nucleic acids are NOT DETECTED.  The SARS-CoV-2 RNA is generally detectable in upper respiratory specimens during the acute phase of infection. The lowest concentration of SARS-CoV-2 viral copies this assay can detect is 138 copies/mL. A negative result does not preclude SARS-Cov-2 infection and should not be used as the sole basis for treatment or other patient management decisions. A negative result may occur with  improper specimen collection/handling, submission of specimen other than nasopharyngeal swab, presence of viral mutation(s) within the areas targeted by this assay, and inadequate number of viral copies(<138 copies/mL). A negative result must be combined with clinical observations, patient history, and epidemiological information. The expected result is Negative.  Fact Sheet for Patients:  EntrepreneurPulse.com.au  Fact Sheet for Healthcare Providers:  IncredibleEmployment.be  This test is no t yet approved or cleared by the [place] FDA and  has been authorized for detection and/or diagnosis of SARS-CoV-2 by FDA under an Emergency Use Authorization (EUA). This EUA will remain  in effect (meaning this test can be used) for the duration of the COVID-19 declaration under Section 564(b)(1) of the Act, 21 U.S.C.section 360bbb-3(b)(1), unless the authorization is terminated  or revoked sooner.       Influenza A by PCR NEGATIVE NEGATIVE Final   Influenza B by PCR NEGATIVE NEGATIVE Final    Comment: (NOTE) The Xpert Xpress SARS-CoV-2/FLU/RSV plus assay is intended as an aid in the diagnosis of influenza from Nasopharyngeal swab specimens and should not be used as a sole basis for treatment. Nasal washings and aspirates  are unacceptable for Xpert Xpress SARS-CoV-2/FLU/RSV testing.  Fact Sheet for Patients: EntrepreneurPulse.com.au  Fact Sheet for Healthcare Providers: IncredibleEmployment.be  This test is not yet approved or cleared by the [place] FDA and has been authorized for detection and/or diagnosis of SARS-CoV-2 by FDA under an Emergency Use Authorization (EUA). This EUA will remain in effect (meaning this test can be used) for the duration  of the COVID-19 declaration under Section 564(b)(1) of the Act, 21 U.S.C. section 360bbb-3(b)(1), unless the authorization is terminated or revoked.  Performed at [facility], [address] [place], [place] [address]          Radiology Studies: CT HEAD WO CONTRAST (5MM)  Result Date: 01/03/2021 CLINICAL DATA:  Head trauma fall EXAM: CT HEAD WITHOUT CONTRAST TECHNIQUE: Contiguous axial images were obtained from the base of the skull through the vertex without intravenous contrast. COMPARISON:  None. FINDINGS: Brain: No acute territorial infarction, or hemorrhage. Possible small extra-axial mass at the left parietal vertex with calcification, sagittal series 5, image 32, measuring 8 mm, probably representing meningioma. The ventricles are non enlarged. Vascular: No hyperdense vessels.  No unexpected calcification Skull: Normal. Negative for fracture or focal lesion. Sinuses/Orbits: No acute finding. Other: None IMPRESSION: 1. No CT evidence for acute intracranial abnormality. 2. Suspicion of small extra-axial mass at the left parietal vertex, which may represent small meningioma Electronically Signed   By: Donavan Foil M.D.   On: 01/03/2021 17:49   CT Angio Chest PE W and/or Wo Contrast  Result Date: 01/03/2021 CLINICAL DATA:  Weakness AFib EXAM: CT ANGIOGRAPHY CHEST WITH CONTRAST TECHNIQUE: Multidetector CT imaging of the chest was performed using the standard protocol during bolus administration of  intravenous contrast. Multiplanar CT image reconstructions and MIPs were obtained to evaluate the vascular anatomy. CONTRAST:  32mL OMNIPAQUE IOHEXOL 350 MG/ML SOLN COMPARISON:  CT 11/23/2020, chest CT 09/18/2020 FINDINGS: Cardiovascular: Satisfactory opacification of the pulmonary arteries to the segmental level. Negative for acute filling defect within the main, lobar or segmental pulmonary vessels. Hypoenhancement right lower lobe subsegmental arteries, for example series 7, image 24. The vessels appear irregular and attenuated in caliber compared to the prior CTA and suspect that findings are likely related to chronic PE. Negative for pericardial effusion. Borderline cardiomegaly. Mediastinum/Nodes: Midline trachea. No thyroid mass. Postsurgical changes of the left lobe. No suspicious adenopathy. Esophagus within normal limits Lungs/Pleura: Small right-sided pleural effusion. Mild ground-glass density in the right lung base, could be secondary to mosaic perfusion. Upper Abdomen: No acute abnormality. Mild stranding in left upper quadrant omentum without change. Musculoskeletal: Incompletely visualized nodularity within the subareolar left breast, without significant change Review of the MIP images confirms the above findings. IMPRESSION: 1. No definite evidence for acute main, lobar, or segmental pulmonary embolus. Poor enhancement of right lower lobe subsegmental vessels with overall attenuated caliber of vessels, suspect that findings are related to sequela of chronic PE given findings on prior angiography. 2. Small right-sided pleural effusion Electronically Signed   By: Donavan Foil M.D.   On: 01/03/2021 18:06        Scheduled Meds:  atorvastatin  10 mg Oral QHS   levothyroxine  150 mcg Oral Q0600   metoprolol succinate  25 mg Oral Daily   potassium chloride  40 mEq Oral BID   rivaroxaban  20 mg Oral Q supper   Continuous Infusions:  diltiazem (CARDIZEM) infusion     lactated ringers 125  mL/hr at 01/05/21 0047   magnesium sulfate bolus IVPB       LOS: 1 day    Time spent: 33 mins     Wyvonnia Dusky, MD [facility] Pager [phone]  If 7PM-7AM, please contact night-coverage 01/05/2021, 8:01 AM

## 2021-01-05 NOTE — ED Notes (Signed)
Hospitalist at bedside 

## 2021-01-05 NOTE — ED Notes (Signed)
Informed RN bed assigned 

## 2021-01-05 NOTE — ED Notes (Signed)
Pt assisted off bedpan. Wiped with washcloth. New chux provided.

## 2021-01-05 NOTE — ED Notes (Signed)
Pt mixed oral potassium left at bedside. Pt educated on why she needs to drink it. States that she will sip on it.

## 2021-01-05 NOTE — ED Notes (Signed)
Breakfast tray delivered

## 2021-01-06 ENCOUNTER — Inpatient Hospital Stay: Payer: Medicare Other

## 2021-01-06 DIAGNOSIS — I4891 Unspecified atrial fibrillation: Secondary | ICD-10-CM | POA: Diagnosis not present

## 2021-01-06 DIAGNOSIS — R197 Diarrhea, unspecified: Secondary | ICD-10-CM | POA: Diagnosis not present

## 2021-01-06 DIAGNOSIS — E876 Hypokalemia: Secondary | ICD-10-CM | POA: Diagnosis not present

## 2021-01-06 LAB — POTASSIUM: Potassium: 2.8 mmol/L — ABNORMAL LOW (ref 3.5–5.1)

## 2021-01-06 MED ORDER — MAGNESIUM SULFATE 2 GM/50ML IV SOLN
2.0000 g | Freq: Once | INTRAVENOUS | Status: AC
  Administered 2021-01-06: 2 g via INTRAVENOUS
  Filled 2021-01-06: qty 50

## 2021-01-06 MED ORDER — POTASSIUM CHLORIDE CRYS ER 20 MEQ PO TBCR
20.0000 meq | EXTENDED_RELEASE_TABLET | Freq: Once | ORAL | Status: AC
  Administered 2021-01-06: 20 meq via ORAL
  Filled 2021-01-06: qty 2

## 2021-01-06 MED ORDER — POTASSIUM CHLORIDE 10 MEQ/100ML IV SOLN
10.0000 meq | INTRAVENOUS | Status: AC
  Administered 2021-01-06 (×6): 10 meq via INTRAVENOUS
  Filled 2021-01-06 (×6): qty 100

## 2021-01-06 MED ORDER — CHLORHEXIDINE GLUCONATE CLOTH 2 % EX PADS
6.0000 | MEDICATED_PAD | Freq: Every day | CUTANEOUS | Status: DC
  Administered 2021-01-06 – 2021-01-11 (×6): 6 via TOPICAL

## 2021-01-06 NOTE — Progress Notes (Signed)
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 fib: w/ RVR. New onset. Continue on metoprolol, xarelto.  Spontaneously converted w/o cardizem drip. Needs outpatient f/u w/ cardio, Dr. Saunders Revel    Nonsustained ventricular tachycardia: 1 episode.  Likely secondary to electrolyte abnormalities.    Adenocarcinoma of transverse colon: chemo as an outpatient    Meningioma: noted on CT head. Will need outpatient f/u   Hx of pulmonary embolism: continue on xarelto    Hypokalemia: potassium and mag ordered. Repeat potassium level ordered  Diarrhea: etiology unclear. Likely etiology on severe hypokalemia. C. diff is neg. GI PCR panel is neg as well    Generalized weakness: PT/OT consulted    Fall: at home. Likely secondary to volume depletion/dehydration. PT/OT consulted    Hypothyroidism: continue on home dose of levothyroxine    Suprapubic tenderness: likely secondary to UTI. Urine cx is growing e. Coli. Continue on IV cipro   DVT prophylaxis: xarelto  Code Status: full  Family Communication:  Disposition Plan: depends on PT/OT recs   Level of care: Med-Surg  Status is: Inpatient  Remains inpatient appropriate because: secondary severity of illness     Consultants:    Procedures:   Antimicrobials: cipro    Subjective: Pt c/o fatigue and diarrhea   Objective: Vitals:   01/05/21 2345 01/06/21 0339 01/06/21 0525 01/06/21 0742  BP:  132/70  132/80  Pulse: 74 73  66  Resp:  18  16  Temp:  98.3 F (36.8 C)  97.8 F (36.6 C)  TempSrc:       SpO2: 99% 100%  100%  Weight:   88.4 kg   Height:        Intake/Output Summary (Last 24 hours) at 01/06/2021 0749 Last data filed at 01/05/2021 2132 Gross per 24 hour  Intake 680 ml  Output --  Net 680 ml   Filed Weights   01/03/21 1546 01/05/21 1514 01/06/21 0525  Weight: 86 kg 88.4 kg 88.4 kg    Examination:  General exam: Appears comfortable  Respiratory system: clear breath sounds b/l  Cardiovascular system: S1/S2+. No rubs or clicks  Gastrointestinal system: Abd is soft, NT, obese & hyperactive bowel sounds  Central nervous system: alert and oriented. Moves all extremities  Psychiatry: Judgement and insight appear normal. Flat mood and affect    Data Reviewed: I have personally reviewed following labs and imaging studies  CBC: Recent Labs  Lab 01/03/21 1556 01/04/21 0250 01/05/21 0658  WBC 1.9* 3.4* 4.1  HGB 12.5 11.9* 11.7*  HCT 36.9 35.7* 34.6*  MCV 87.2 88.4 86.5  PLT 128* 143* 329   Basic Metabolic Panel: Recent Labs  Lab 01/03/21 1556 01/04/21 0250 01/05/21 0658  NA 137 139 136  K 2.8* 2.4* 2.5*  CL 92* 97* 97*  CO2 26 26 27   GLUCOSE 111* 73 58*  BUN 8 7* <5*  CREATININE 0.93 0.75 0.51  CALCIUM 8.3* 7.7* 7.8*  MG  --  1.6* 1.9   GFR: Estimated Creatinine Clearance: 78.5 mL/min (by C-G formula based on SCr of  0.51 mg/dL). Liver Function Tests: Recent Labs  Lab 01/03/21 1556  AST 19  ALT 14  ALKPHOS 90  BILITOT 1.8*  PROT 7.1  ALBUMIN 3.0*   No results for input(s): LIPASE, AMYLASE in the last 168 hours. No results for input(s): AMMONIA in the last 168 hours. Coagulation Profile: Recent Labs  Lab 01/03/21 1556  INR 1.3*   Cardiac Enzymes: No results for input(s): CKTOTAL, CKMB, CKMBINDEX, TROPONINI in the last 168 hours. BNP (last 3 results) No results for input(s): PROBNP in the last 8760 hours. HbA1C: No results for input(s): HGBA1C in the last 72 hours. CBG: No results for input(s): GLUCAP in the last 168 hours. Lipid  Profile: No results for input(s): CHOL, HDL, LDLCALC, TRIG, CHOLHDL, LDLDIRECT in the last 72 hours. Thyroid Function Tests: Recent Labs    01/04/21 0250  TSH 7.858*  FREET4 0.98   Anemia Panel: No results for input(s): VITAMINB12, FOLATE, FERRITIN, TIBC, IRON, RETICCTPCT in the last 72 hours. Sepsis Labs: Recent Labs  Lab 01/03/21 2215 01/04/21 0250  LATICACIDVEN 2.2* 2.3*    Recent Results (from the past 240 hour(s))  CULTURE, BLOOD (ROUTINE X 2) w Reflex to ID Panel     Status: None (Preliminary result)   Collection Time: 01/03/21 10:15 PM   Specimen: BLOOD  Result Value Ref Range Status   Specimen Description BLOOD RIGHT FOREARM  Final   Special Requests   Final    BOTTLES DRAWN AEROBIC AND ANAEROBIC Blood Culture adequate volume   Culture   Final    NO GROWTH 3 DAYS Performed at [facility], [address] [place], [place] [address]    Report Status PENDING  Incomplete  Urine Culture     Status: Abnormal   Collection Time: 01/03/21 10:20 PM   Specimen: Urine, Random  Result Value Ref Range Status   Specimen Description   Final    URINE, RANDOM Performed at [facility], [address], [place], [place] [address]    Special Requests   Final    Immunocompromised Performed at [facility], [address] [place], [place] [address]    Culture 20,000 COLONIES/mL ESCHERICHIA COLI (A)  Final   Report Status 01/05/2021 FINAL  Final   Organism ID, Bacteria ESCHERICHIA COLI (A)  Final      Susceptibility   Escherichia coli - MIC*    AMPICILLIN 4 SENSITIVE Sensitive     CEFAZOLIN <=4 SENSITIVE Sensitive     CEFEPIME <=0.12 SENSITIVE Sensitive     CEFTRIAXONE <=0.25 SENSITIVE Sensitive     CIPROFLOXACIN <=0.25 SENSITIVE Sensitive     GENTAMICIN <=1 SENSITIVE Sensitive     IMIPENEM <=0.25 SENSITIVE Sensitive     NITROFURANTOIN <=16 SENSITIVE Sensitive     TRIMETH/SULFA <=20 SENSITIVE Sensitive     AMPICILLIN/SULBACTAM <=2 SENSITIVE  Sensitive     PIP/TAZO <=4 SENSITIVE Sensitive     * 20,000 COLONIES/mL ESCHERICHIA COLI  C Difficile Quick Screen w PCR reflex     Status: None   Collection Time: 01/04/21 12:29 PM   Specimen: Perirectal; Stool  Result Value Ref Range Status   C Diff antigen NEGATIVE NEGATIVE Final   C Diff toxin NEGATIVE NEGATIVE Final   C Diff interpretation No C. difficile detected.  Final    Comment: Performed at [facility], [address] [place], [place] [address]  Gastrointestinal Panel by PCR , Stool     Status: None   Collection Time: 01/04/21 12:30 PM  Specimen: Stool  Result Value Ref Range Status   Campylobacter species NOT DETECTED NOT DETECTED Final   Plesimonas shigelloides NOT DETECTED NOT DETECTED Final   Salmonella species NOT DETECTED NOT DETECTED Final   Yersinia enterocolitica NOT DETECTED NOT DETECTED Final   Vibrio species NOT DETECTED NOT DETECTED Final   Vibrio cholerae NOT DETECTED NOT DETECTED Final   Enteroaggregative E coli (EAEC) NOT DETECTED NOT DETECTED Final   Enteropathogenic E coli (EPEC) NOT DETECTED NOT DETECTED Final   Enterotoxigenic E coli (ETEC) NOT DETECTED NOT DETECTED Final   Shiga like toxin producing E coli (STEC) NOT DETECTED NOT DETECTED Final   Shigella/Enteroinvasive E coli (EIEC) NOT DETECTED NOT DETECTED Final   Cryptosporidium NOT DETECTED NOT DETECTED Final   Cyclospora cayetanensis NOT DETECTED NOT DETECTED Final   Entamoeba histolytica NOT DETECTED NOT DETECTED Final   Giardia lamblia NOT DETECTED NOT DETECTED Final   Adenovirus F40/41 NOT DETECTED NOT DETECTED Final   Astrovirus NOT DETECTED NOT DETECTED Final   Norovirus GI/GII NOT DETECTED NOT DETECTED Final   Rotavirus A NOT DETECTED NOT DETECTED Final   Sapovirus (I, II, IV, and V) NOT DETECTED NOT DETECTED Final    Comment: Performed at [facility], [address] [place], [place] [address]  Resp Panel by RT-PCR (Flu A&B, Covid) Nasopharyngeal Swab     Status:  None   Collection Time: 01/04/21  4:45 PM   Specimen: Nasopharyngeal Swab; Nasopharyngeal(NP) swabs in vial transport medium  Result Value Ref Range Status   SARS Coronavirus 2 by RT PCR NEGATIVE NEGATIVE Final    Comment: (NOTE) SARS-CoV-2 target nucleic acids are NOT DETECTED.  The SARS-CoV-2 RNA is generally detectable in upper respiratory specimens during the acute phase of infection. The lowest concentration of SARS-CoV-2 viral copies this assay can detect is 138 copies/mL. A negative result does not preclude SARS-Cov-2 infection and should not be used as the sole basis for treatment or other patient management decisions. A negative result may occur with  improper specimen collection/handling, submission of specimen other than nasopharyngeal swab, presence of viral mutation(s) within the areas targeted by this assay, and inadequate number of viral copies(<138 copies/mL). A negative result must be combined with clinical observations, patient history, and epidemiological information. The expected result is Negative.  Fact Sheet for Patients:  EntrepreneurPulse.com.au  Fact Sheet for Healthcare Providers:  IncredibleEmployment.be  This test is no t yet approved or cleared by the [place] FDA and  has been authorized for detection and/or diagnosis of SARS-CoV-2 by FDA under an Emergency Use Authorization (EUA). This EUA will remain  in effect (meaning this test can be used) for the duration of the COVID-19 declaration under Section 564(b)(1) of the Act, 21 U.S.C.section 360bbb-3(b)(1), unless the authorization is terminated  or revoked sooner.       Influenza A by PCR NEGATIVE NEGATIVE Final   Influenza B by PCR NEGATIVE NEGATIVE Final    Comment: (NOTE) The Xpert Xpress SARS-CoV-2/FLU/RSV plus assay is intended as an aid in the diagnosis of influenza from Nasopharyngeal swab specimens and should not be used as a sole basis for  treatment. Nasal washings and aspirates are unacceptable for Xpert Xpress SARS-CoV-2/FLU/RSV testing.  Fact Sheet for Patients: EntrepreneurPulse.com.au  Fact Sheet for Healthcare Providers: IncredibleEmployment.be  This test is not yet approved or cleared by the [place] FDA and has been authorized for detection and/or diagnosis of SARS-CoV-2 by FDA under an Emergency Use Authorization (EUA). This EUA will remain in  effect (meaning this test can be used) for the duration of the COVID-19 declaration under Section 564(b)(1) of the Act, 21 U.S.C. section 360bbb-3(b)(1), unless the authorization is terminated or revoked.  Performed at [facility], [address] [place], [place] [address]          Radiology Studies: No results found.      Scheduled Meds:  atorvastatin  10 mg Oral QHS   levothyroxine  150 mcg Oral Q0600   metoprolol succinate  25 mg Oral Daily   potassium chloride  40 mEq Oral BID   potassium chloride  20 mEq Oral Once   rivaroxaban  20 mg Oral Q supper   Continuous Infusions:  ciprofloxacin 400 mg (01/05/21 2116)   lactated ringers 75 mL/hr at 01/05/21 1005   magnesium sulfate bolus IVPB       LOS: 2 days    Time spent: 31 mins     Wyvonnia Dusky, MD [facility] Pager [phone]  If 7PM-7AM, please contact night-coverage 01/06/2021, 7:49 AM

## 2021-01-06 NOTE — Progress Notes (Signed)
OT Cancellation Note  Patient Details Name: Judith Ross MRN: [id number] DOB: Feb 10, 1956   Cancelled Treatment:    Reason Eval/Treat Not Completed: Medical issues which prohibited therapy. OT order received and chart reviewed. Pt's Potassium documented at 2.5 on 10/19 although trending up is still contraindicated for OT intervention. OT awaiting new lab results for today to determine appropriateness for evaluation this date.   Darleen Crocker, MS, OTR/L , CBIS ascom [phone]  01/06/21, 11:30 AM

## 2021-01-06 NOTE — Progress Notes (Signed)
PT Cancellation Note  Patient Details Name: Judith Ross MRN: [id number] DOB: September 15, 1955   Cancelled Treatment:    Reason Eval/Treat Not Completed: Medical issues which prohibited therapy Pt has lab values outside parameters for activity with PT.  Spoke with nursing who recommends holding on PT at this time.  Will maintain on caseload, follow remotely, and initiate PT exam when appropriate.  Kreg Shropshire, DPT 01/06/2021, 1:36 PM

## 2021-01-07 DIAGNOSIS — R197 Diarrhea, unspecified: Secondary | ICD-10-CM | POA: Diagnosis not present

## 2021-01-07 DIAGNOSIS — I4891 Unspecified atrial fibrillation: Secondary | ICD-10-CM | POA: Diagnosis not present

## 2021-01-07 DIAGNOSIS — E876 Hypokalemia: Secondary | ICD-10-CM | POA: Diagnosis not present

## 2021-01-07 LAB — CBC
HCT: 28.8 % — ABNORMAL LOW (ref 36.0–46.0)
Hemoglobin: 9.7 g/dL — ABNORMAL LOW (ref 12.0–15.0)
MCH: 28.7 pg (ref 26.0–34.0)
MCHC: 33.7 g/dL (ref 30.0–36.0)
MCV: 85.2 fL (ref 80.0–100.0)
Platelets: 191 10*3/uL (ref 150–400)
RBC: 3.38 MIL/uL — ABNORMAL LOW (ref 3.87–5.11)
RDW: 20.7 % — ABNORMAL HIGH (ref 11.5–15.5)
WBC: 4.3 10*3/uL (ref 4.0–10.5)
nRBC: 0.7 % — ABNORMAL HIGH (ref 0.0–0.2)

## 2021-01-07 LAB — BASIC METABOLIC PANEL
Anion gap: 9 (ref 5–15)
BUN: <5 mg/dL — ABNORMAL LOW (ref 8–23)
CO2: 29 mmol/L (ref 22–32)
Calcium: 7.5 mg/dL — ABNORMAL LOW (ref 8.9–10.3)
Chloride: 100 mmol/L (ref 98–111)
Creatinine, Ser: 0.74 mg/dL (ref 0.44–1.00)
GFR, Estimated: >60 mL/min (ref >60)
Glucose, Bld: 61 mg/dL — ABNORMAL LOW (ref 70–99)
Potassium: 2.4 mmol/L — CL (ref 3.5–5.1)
Sodium: 138 mmol/L (ref 135–145)

## 2021-01-07 LAB — MAGNESIUM: Magnesium: 2 mg/dL (ref 1.7–2.4)

## 2021-01-07 LAB — POTASSIUM: Potassium: 2.7 mmol/L — CL (ref 3.5–5.1)

## 2021-01-07 MED ORDER — POTASSIUM CHLORIDE 10 MEQ/100ML IV SOLN
10.0000 meq | INTRAVENOUS | Status: AC
  Administered 2021-01-07 – 2021-01-08 (×4): 10 meq via INTRAVENOUS
  Filled 2021-01-07 (×4): qty 100

## 2021-01-07 MED ORDER — POTASSIUM CHLORIDE 10 MEQ/100ML IV SOLN
10.0000 meq | INTRAVENOUS | Status: AC
Start: 2021-01-07 — End: 2021-01-07
  Administered 2021-01-07 (×6): 10 meq via INTRAVENOUS
  Filled 2021-01-07 (×6): qty 100

## 2021-01-07 MED ORDER — POTASSIUM CHLORIDE CRYS ER 20 MEQ PO TBCR
40.0000 meq | EXTENDED_RELEASE_TABLET | Freq: Once | ORAL | Status: AC
  Administered 2021-01-07: 40 meq via ORAL
  Filled 2021-01-07: qty 2

## 2021-01-07 NOTE — Progress Notes (Signed)
Date and time results received: 01/07/21 08:45  Test: Potassium Critical Value: 2.4  Name of Provider Notified: Dr. Jimmye Norman

## 2021-01-07 NOTE — Evaluation (Signed)
Physical Therapy Evaluation Patient Details Name: Judith Ross MRN: [id number] DOB: Jul 22, 1955 Today's Date: 01/07/2021  History of Present Illness  65 y.o. female with medical history significant for colon cancer (diagnosed 08/18/20) currently undergoing treatment (last chemo 12/22/20), pulmonary embolism July 2022 (first ever PE; now on Xarelto), hypothyroidism, hypertension, hyperlipidemia, who presents to the emergency department on 01/03/2021 with generalized weakness. She fell on the day of admission at home when she was trying to get out of bed; she felt dizzy before and after she fell. She did hit her head on the right side.    Clinical Impression  Pt received supine in bed upon arrival to room and agreeable to therapy. Pt able to perform well with bed mobility and transfers.  CGA utilized just for safety being Amelia Jo is the first time ambulating with the patient.  Pt notes she is considerably weaker than her baseline levels, and she is surprised that it happened so quickly.  Pt notes she feels as though she will be ready to go home and therapist agrees as far as a mobility is concerned.  Pt will benefit from continued PT services in order to address the amount of weakness noted int he pt's lower extremities during hospital stint.  Pt will benefit from skilled PT intervention to increase independence and safety with basic mobility in preparation for discharge to the venue listed below.        Recommendations for follow up therapy are one component of a multi-disciplinary discharge planning process, led by the attending physician.  Recommendations may be updated based on patient status, additional functional criteria and insurance authorization.  Follow Up Recommendations Home health PT;Supervision for mobility/OOB    Equipment Recommendations  Rolling walker with 5" wheels    Recommendations for Other Services       Precautions / Restrictions Precautions Precautions:  Fall Restrictions Weight Bearing Restrictions: No      Mobility  Bed Mobility Overal bed mobility: Needs Assistance Bed Mobility: Supine to Sit;Sit to Supine     Supine to sit: Min guard Sit to supine: Min guard        Transfers Overall transfer level: Needs assistance Equipment used: Rolling walker (2 wheeled) Transfers: Sit to/from Stand Sit to Stand: Min guard Stand pivot transfers: Min guard;Min assist       General transfer comment: Pt with good technique, only utilizing CGA for safety being th first attempt being out of bed.  Ambulation/Gait Ambulation/Gait assistance: Min guard Gait Distance (Feet): 160 Feet Assistive device: Rolling walker (2 wheeled)          Stairs            Wheelchair Mobility    Modified Rankin (Stroke Patients Only)       Balance Overall balance assessment: Needs assistance Sitting-balance support: Feet supported;Bilateral upper extremity supported Sitting balance-Leahy Scale: Good     Standing balance support: During functional activity;Bilateral upper extremity supported Standing balance-Leahy Scale: Fair Standing balance comment: UE support                             Pertinent Vitals/Pain Pain Assessment: No/denies pain    Home Living Family/patient expects to be discharged to:: Private residence Living Arrangements: Other relatives (grandson)   Type of Home: House Home Access: Level entry     Home Layout: One level Home Equipment: Environmental consultant - 2 wheels;Cane - single point      Prior Function Level of  Independence: Independent               Hand Dominance        Extremity/Trunk Assessment   Upper Extremity Assessment Upper Extremity Assessment: Generalized weakness    Lower Extremity Assessment Lower Extremity Assessment: Generalized weakness       Communication   Communication: No difficulties  Cognition Arousal/Alertness: Awake/alert Behavior During Therapy: WFL for  tasks assessed/performed Overall Cognitive Status: Within Functional Limits for tasks assessed                                        General Comments      Exercises Total Joint Exercises Ankle Circles/Pumps: AROM;Strengthening;Both;10 reps;Supine Quad Sets: AROM;Strengthening;Both;10 reps;Supine Gluteal Sets: AROM;Strengthening;Both;10 reps;Supine Hip ABduction/ADduction: AROM;Strengthening;Both;10 reps;Supine Straight Leg Raises: AROM;Strengthening;Both;10 reps;Supine Marching in Standing: AROM;Strengthening;Both;10 reps;Standing   Assessment/Plan    PT Assessment Patient needs continued PT services  PT Problem List Decreased strength       PT Treatment Interventions DME instruction    PT Goals (Current goals can be found in the Care Plan section)  Acute Rehab PT Goals Patient Stated Goal: to return home PT Goal Formulation: With patient Time For Goal Achievement: 01/21/21 Potential to Achieve Goals: Good    Frequency Min 2X/week   Barriers to discharge        Co-evaluation               AM-PAC PT "6 Clicks" Mobility  Outcome Measure Help needed turning from your back to your side while in a flat bed without using bedrails?: A Little Help needed moving from lying on your back to sitting on the side of a flat bed without using bedrails?: A Little Help needed moving to and from a bed to a chair (including a wheelchair)?: A Little Help needed standing up from a chair using your arms (e.g., wheelchair or bedside chair)?: A Little Help needed to walk in hospital room?: A Little Help needed climbing 3-5 steps with a railing? : A Little 6 Click Score: 18    End of Session Equipment Utilized During Treatment: Gait belt Activity Tolerance: Patient tolerated treatment well Patient left: in bed;with call bell/phone within reach;with bed alarm set Nurse Communication: Mobility status PT Visit Diagnosis: Unsteadiness on feet (R26.81);Muscle weakness  (generalized) (M62.81)    Time: 3500-9381 PT Time Calculation (min) (ACUTE ONLY): 36 min   Charges:   PT Evaluation $PT Eval Low Complexity: 1 Low PT Treatments $Gait Training: 8-22 mins $Therapeutic Exercise: 8-22 mins        Gwenlyn Saran, PT, DPT 01/07/21, 2:42 PM   Christie Nottingham 01/07/2021, 2:37 PM

## 2021-01-07 NOTE — Progress Notes (Signed)
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 fib: w/ RVR. New onset. Continue on metoprolol, xarelto. Converted w/o cardizem drip. Needs outpatient f/u w/ cardio, Dr. Saunders Revel    Nonsustained ventricular tachycardia: 1 episode.  Likely secondary to electrolyte abnormalities.    Adenocarcinoma of transverse colon: chemo as an outpatient    Meningioma: noted on CT head. Will need outpatient f/u   Hx of pulmonary embolism: continue on xarelto    Hypokalemia: IV potassium ordered. Mg is WNL. Repeat K level ordered   Diarrhea: etiology unclear. Likely etiology on severe hypokalemia. C. diff is neg. GI PCR panel is neg as well    Generalized weakness: OT recs HH    Fall: at home. Likely secondary to volume depletion/dehydration. PT/OT consulted    Hypothyroidism: continue on home dose of levothyroxine    Suprapubic tenderness: likely secondary to UTI. Urine cx is growing e. Coli. Continue on cipro   DVT prophylaxis: xarelto  Code Status: full  Family Communication:  Disposition Plan: likely d/c home w/ HH   Level of care: Med-Surg  Status is: Inpatient  Remains inpatient appropriate because: secondary severity of illness     Consultants:    Procedures:   Antimicrobials: cipro    Subjective: Pt c/o malaise   Objective: Vitals:   01/06/21 2336 01/07/21 0429 01/07/21 0741 01/07/21 1210  BP: 114/69 124/71 115/68 114/69  Pulse: 76 76 73 75  Resp: 20 20 18 16   Temp: 97.9 F (36.6 C) 98 F (36.7 C) 97.9 F (36.6 C) 97.9 F (36.6 C)   TempSrc: Oral Oral    SpO2: 100% 99% 99% 100%  Weight:  80 kg    Height:        Intake/Output Summary (Last 24 hours) at 01/07/2021 1443 Last data filed at 01/07/2021 1404 Gross per 24 hour  Intake 1070.18 ml  Output --  Net 1070.18 ml   Filed Weights   01/05/21 1514 01/06/21 0525 01/07/21 0429  Weight: 88.4 kg 88.4 kg 80 kg    Examination:  General exam: Appears calm & comfortable  Respiratory system: clear breath sounds b/l  Cardiovascular system: S1 & S2+. No rubs or clicks  Gastrointestinal system: Abd is soft, NT, ND & hyperactive bowel sounds  Central nervous system: alert and oriented. Moves all extremities  Psychiatry: Judgement and insight appear normal. Flat mood and affect     Data Reviewed: I have personally reviewed following labs and imaging studies  CBC: Recent Labs  Lab 01/03/21 1556 01/04/21 0250 01/05/21 0658 01/07/21 0808  WBC 1.9* 3.4* 4.1 4.3  HGB 12.5 11.9* 11.7* 9.7*  HCT 36.9 35.7* 34.6* 28.8*  MCV 87.2 88.4 86.5 85.2  PLT 128* 143* 153 341   Basic Metabolic Panel: Recent Labs  Lab 01/03/21 1556 01/04/21 0250 01/05/21 0658 01/06/21 1856 01/07/21 0808  NA 137 139 136  --  138  K 2.8* 2.4* 2.5* 2.8* 2.4*  CL 92* 97* 97*  --  100  CO2 26 26 27   --  29  GLUCOSE 111* 73 58*  --  61*  BUN 8 7* <5*  --  <5*  CREATININE 0.93 0.75 0.51  --  0.74  CALCIUM 8.3* 7.7* 7.8*  --  7.5*  MG  --  1.6* 1.9  --  2.0   GFR: Estimated Creatinine Clearance: 74.8 mL/min (by C-G formula based on SCr of 0.74 mg/dL). Liver Function Tests: Recent Labs  Lab 01/03/21 1556  AST 19  ALT 14  ALKPHOS 90  BILITOT 1.8*  PROT 7.1  ALBUMIN 3.0*   No results for input(s): LIPASE, AMYLASE in the last 168 hours. No results for input(s): AMMONIA in the last 168 hours. Coagulation Profile: Recent Labs  Lab 01/03/21 1556  INR 1.3*   Cardiac Enzymes: No results for input(s): CKTOTAL, CKMB, CKMBINDEX, TROPONINI in the last 168 hours. BNP (last 3  results) No results for input(s): PROBNP in the last 8760 hours. HbA1C: No results for input(s): HGBA1C in the last 72 hours. CBG: No results for input(s): GLUCAP in the last 168 hours. Lipid Profile: No results for input(s): CHOL, HDL, LDLCALC, TRIG, CHOLHDL, LDLDIRECT in the last 72 hours. Thyroid Function Tests: No results for input(s): TSH, T4TOTAL, FREET4, T3FREE, THYROIDAB in the last 72 hours.  Anemia Panel: No results for input(s): VITAMINB12, FOLATE, FERRITIN, TIBC, IRON, RETICCTPCT in the last 72 hours. Sepsis Labs: Recent Labs  Lab 01/03/21 2215 01/04/21 0250  LATICACIDVEN 2.2* 2.3*    Recent Results (from the past 240 hour(s))  CULTURE, BLOOD (ROUTINE X 2) w Reflex to ID Panel     Status: None (Preliminary result)   Collection Time: 01/03/21 10:15 PM   Specimen: BLOOD  Result Value Ref Range Status   Specimen Description BLOOD RIGHT FOREARM  Final   Special Requests   Final    BOTTLES DRAWN AEROBIC AND ANAEROBIC Blood Culture adequate volume   Culture   Final    NO GROWTH 4 DAYS Performed at [facility], [address], [place], [place] [address]    Report Status PENDING  Incomplete  Urine Culture     Status: Abnormal   Collection Time: 01/03/21 10:20 PM   Specimen: Urine, Random  Result Value Ref Range Status   Specimen Description   Final    URINE, RANDOM Performed at [facility], [address], [place], [place] [address]    Special Requests   Final    Immunocompromised Performed at [facility], [place], [place] [address]    Culture 20,000 COLONIES/mL ESCHERICHIA COLI (A)  Final   Report Status 01/05/2021 FINAL  Final   Organism ID, Bacteria ESCHERICHIA COLI (A)  Final      Susceptibility   Escherichia coli - MIC*    AMPICILLIN 4 SENSITIVE Sensitive     CEFAZOLIN <=4 SENSITIVE Sensitive     CEFEPIME <=0.12 SENSITIVE Sensitive     CEFTRIAXONE <=0.25 SENSITIVE Sensitive     CIPROFLOXACIN <=0.25  SENSITIVE Sensitive     GENTAMICIN <=1 SENSITIVE Sensitive     IMIPENEM <=0.25 SENSITIVE Sensitive     NITROFURANTOIN <=16 SENSITIVE Sensitive     TRIMETH/SULFA <=20 SENSITIVE Sensitive     AMPICILLIN/SULBACTAM <=2 SENSITIVE Sensitive     PIP/TAZO <=4 SENSITIVE Sensitive     * 20,000 COLONIES/mL ESCHERICHIA COLI  Stool culture (children & immunocomp patients)     Status: None (Preliminary result)   Collection Time: 01/04/21 12:29 PM   Specimen: Perirectal; Stool  Result Value Ref Range Status   Salmonella/Shigella Screen Final report  Final  Campylobacter Culture PENDING  Incomplete   E coli, Shiga toxin Assay Negative Negative Final    Comment: (NOTE) Performed At: [facility] [place], [place] [id number] Rush Farmer MD [id number]   C Difficile Quick Screen w PCR reflex     Status: None   Collection Time: 01/04/21 12:29 PM   Specimen: Perirectal; Stool  Result Value Ref Range Status   C Diff antigen NEGATIVE NEGATIVE Final   C Diff toxin NEGATIVE NEGATIVE Final   C Diff interpretation No C. difficile detected.  Final    Comment: Performed at [facility], [address] [place], [place] [address]  STOOL CULTURE REFLEX - RSASHR     Status: None   Collection Time: 01/04/21 12:29 PM  Result Value Ref Range Status   Stool Culture result 1 (RSASHR) Comment  Final    Comment: (NOTE) No Salmonella or Shigella recovered. Performed At: [facility] [place], [place] [id number] Rush Farmer MD [id number]   Gastrointestinal Panel by PCR , Stool     Status: None   Collection Time: 01/04/21 12:30 PM   Specimen: Stool  Result Value Ref Range Status   Campylobacter species NOT DETECTED NOT DETECTED Final   Plesimonas shigelloides NOT DETECTED NOT DETECTED Final   Salmonella species NOT DETECTED NOT DETECTED Final   Yersinia enterocolitica NOT DETECTED NOT DETECTED Final   Vibrio species NOT DETECTED NOT DETECTED Final    Vibrio cholerae NOT DETECTED NOT DETECTED Final   Enteroaggregative E coli (EAEC) NOT DETECTED NOT DETECTED Final   Enteropathogenic E coli (EPEC) NOT DETECTED NOT DETECTED Final   Enterotoxigenic E coli (ETEC) NOT DETECTED NOT DETECTED Final   Shiga like toxin producing E coli (STEC) NOT DETECTED NOT DETECTED Final   Shigella/Enteroinvasive E coli (EIEC) NOT DETECTED NOT DETECTED Final   Cryptosporidium NOT DETECTED NOT DETECTED Final   Cyclospora cayetanensis NOT DETECTED NOT DETECTED Final   Entamoeba histolytica NOT DETECTED NOT DETECTED Final   Giardia lamblia NOT DETECTED NOT DETECTED Final   Adenovirus F40/41 NOT DETECTED NOT DETECTED Final   Astrovirus NOT DETECTED NOT DETECTED Final   Norovirus GI/GII NOT DETECTED NOT DETECTED Final   Rotavirus A NOT DETECTED NOT DETECTED Final   Sapovirus (I, II, IV, and V) NOT DETECTED NOT DETECTED Final    Comment: Performed at [facility], [address] [place], [place] [address]  Resp Panel by RT-PCR (Flu A&B, Covid) Nasopharyngeal Swab     Status: None   Collection Time: 01/04/21  4:45 PM   Specimen: Nasopharyngeal Swab; Nasopharyngeal(NP) swabs in vial transport medium  Result Value Ref Range Status   SARS Coronavirus 2 by RT PCR NEGATIVE NEGATIVE Final    Comment: (NOTE) SARS-CoV-2 target nucleic acids are NOT DETECTED.  The SARS-CoV-2 RNA is generally detectable in upper respiratory specimens during the acute phase of infection. The lowest concentration of SARS-CoV-2 viral copies this assay can detect is 138 copies/mL. A negative result does not preclude SARS-Cov-2 infection and should not be used as the sole basis for treatment or other patient management decisions. A negative result may occur with  improper specimen collection/handling, submission of specimen other than nasopharyngeal swab, presence of viral mutation(s) within the areas targeted by this assay, and inadequate number of viral copies(<138 copies/mL).  A negative result must be combined with clinical observations, patient history, and epidemiological information. The expected result is Negative.  Fact Sheet for Patients:  EntrepreneurPulse.com.au  Fact Sheet for Healthcare  Providers:  IncredibleEmployment.be  This test is no t yet approved or cleared by the [place] and  has been authorized for detection and/or diagnosis of SARS-CoV-2 by FDA under an Emergency Use Authorization (EUA). This EUA will remain  in effect (meaning this test can be used) for the duration of the COVID-19 declaration under Section 564(b)(1) of the Act, 21 U.S.C.section 360bbb-3(b)(1), unless the authorization is terminated  or revoked sooner.       Influenza A by PCR NEGATIVE NEGATIVE Final   Influenza B by PCR NEGATIVE NEGATIVE Final    Comment: (NOTE) The Xpert Xpress SARS-CoV-2/FLU/RSV plus assay is intended as an aid in the diagnosis of influenza from Nasopharyngeal swab specimens and should not be used as a sole basis for treatment. Nasal washings and aspirates are unacceptable for Xpert Xpress SARS-CoV-2/FLU/RSV testing.  Fact Sheet for Patients: EntrepreneurPulse.com.au  Fact Sheet for Healthcare Providers: IncredibleEmployment.be  This test is not yet approved or cleared by the [place] FDA and has been authorized for detection and/or diagnosis of SARS-CoV-2 by FDA under an Emergency Use Authorization (EUA). This EUA will remain in effect (meaning this test can be used) for the duration of the COVID-19 declaration under Section 564(b)(1) of the Act, 21 U.S.C. section 360bbb-3(b)(1), unless the authorization is terminated or revoked.  Performed at [facility], [address], [place], [place] [address]          Radiology Studies: No results found.      Scheduled Meds:  atorvastatin  10 mg Oral QHS   Chlorhexidine Gluconate Cloth   6 each Topical Daily   levothyroxine  150 mcg Oral Q0600   metoprolol succinate  25 mg Oral Daily   rivaroxaban  20 mg Oral Q supper   Continuous Infusions:  ciprofloxacin Stopped (01/07/21 1156)   potassium chloride 10 mEq (01/07/21 1425)     LOS: 3 days    Time spent: 25 mins     Wyvonnia Dusky, MD [facility] Pager [phone]  If 7PM-7AM, please contact night-coverage 01/07/2021, 2:43 PM

## 2021-01-07 NOTE — Evaluation (Addendum)
Occupational Therapy Evaluation Patient Details Name: Judith Ross MRN: [id number] DOB: [birth year] Today's Date: 01/07/2021   History of Present Illness 65 y.o. female with medical history significant for colon cancer (diagnosed 08/18/20) currently undergoing treatment (last chemo 12/22/20), pulmonary embolism July 2022 (first ever PE; now on Xarelto), hypothyroidism, hypertension, hyperlipidemia, who presents to the emergency department on 01/03/2021 with generalized weakness. She fell on the day of admission at home when she was trying to get out of bed; she felt dizzy before and after she fell. She did hit her head on the right side.   Clinical Impression   Patient presenting with decreased Ind in self care, balance, functional mobility/transfers, endurance, and safety awareness. Patient reports being independent at baseline and her grandson lives with her. She does indorse increased weakness recently with fall at home. Patient currently functioning at min guard - min A with use of RW. Pt does have scissoring gait when in tight spaces with RW. Patient will benefit from acute OT to increase overall independence in the areas of ADLs, functional mobility, and safety awareness in order to safely discharge home with family.   MD contacted via secure chat in regards to critical K+ of 2.4 with Dr. Jimmye Norman requesting therapy work with pt with no restrictions.     Recommendations for follow up therapy are one component of a multi-disciplinary discharge planning process, led by the attending physician.  Recommendations may be updated based on patient status, additional functional criteria and insurance authorization.   Follow Up Recommendations  Home health OT;Supervision - Intermittent    Equipment Recommendations  3 in 1 bedside commode       Precautions / Restrictions Precautions Precautions: Fall Restrictions Weight Bearing Restrictions: No      Mobility Bed Mobility Overal bed  mobility: Needs Assistance Bed Mobility: Supine to Sit;Sit to Supine     Supine to sit: Min guard Sit to supine: Min guard        Transfers Overall transfer level: Needs assistance Equipment used: Rolling walker (2 wheeled) Transfers: Sit to/from [facility] Sit to Stand: Min guard;Min assist Stand pivot transfers: Min guard;Min assist            Balance Overall balance assessment: Needs assistance Sitting-balance support: Feet supported;Bilateral upper extremity supported Sitting balance-Leahy Scale: Good     Standing balance support: During functional activity;Bilateral upper extremity supported Standing balance-Leahy Scale: Fair Standing balance comment: UE support                           ADL either performed or assessed with clinical judgement   ADL Overall ADL's : Needs assistance/impaired     Grooming: Wash/dry hands;Oral care;Standing;Supervision/safety                   Toilet Transfer: Ambulation;RW;Minimal Print production planner Details (indicate cue type and reason): simulated         Functional mobility during ADLs: Minimal assistance;Rolling walker       Vision Patient Visual Report: No change from baseline              Pertinent Vitals/Pain Pain Assessment: No/denies pain          Extremity/Trunk Assessment Upper Extremity Assessment Upper Extremity Assessment: Generalized weakness   Lower Extremity Assessment Lower Extremity Assessment: Generalized weakness       Communication Communication Communication: No difficulties   Cognition Arousal/Alertness: Awake/alert Behavior During Therapy: WFL for tasks assessed/performed Overall  Cognitive Status: Within Functional Limits for tasks assessed                                                Home Living Family/patient expects to be discharged to:: Private residence Living Arrangements: Other relatives (grandson)    Type of Home: House Home Access: Level entry     Home Layout: One level     Bathroom Shower/Tub: Tub/shower unit         Home Equipment: Environmental consultant - 2 wheels;Cane - single point          Prior Functioning/Environment Level of Independence: Independent                 OT Problem List: Decreased strength;Decreased activity tolerance;Impaired balance (sitting and/or standing);Decreased knowledge of use of DME or AE;Decreased safety awareness      OT Treatment/Interventions: Self-care/ADL training;Therapeutic exercise;Therapeutic activities;Energy conservation;DME and/or AE instruction;Patient/family education;Balance training    OT Goals(Current goals can be found in the care plan section) Acute Rehab OT Goals Patient Stated Goal: to return home OT Goal Formulation: With patient/family Time For Goal Achievement: 01/21/21 Potential to Achieve Goals: Fair ADL Goals Pt Will Perform Grooming: with modified independence;standing Pt Will Perform Lower Body Dressing: with supervision;sit to/from stand Pt Will Transfer to Toilet: with supervision;ambulating Pt Will Perform Toileting - Clothing Manipulation and hygiene: with supervision;sit to/from stand  OT Frequency: Min 2X/week   Barriers to D/C:    none known at this time          AM-PAC OT "6 Clicks" Daily Activity     Outcome Measure Help from another person eating meals?: None Help from another person taking care of personal grooming?: A Little Help from another person toileting, which includes using toliet, bedpan, or urinal?: A Little Help from another person bathing (including washing, rinsing, drying)?: A Little Help from another person to put on and taking off regular upper body clothing?: None Help from another person to put on and taking off regular lower body clothing?: A Little 6 Click Score: 20   End of Session Equipment Utilized During Treatment: Rolling walker Nurse Communication: Mobility  status  Activity Tolerance: Patient tolerated treatment well Patient left: in bed;with call bell/phone within reach;with bed alarm set  OT Visit Diagnosis: Unsteadiness on feet (R26.81);Repeated falls (R29.6);Muscle weakness (generalized) (M62.81);History of falling (Z91.81)                Time: 2446-2863 OT Time Calculation (min): 17 min Charges:  OT General Charges $OT Visit: 1 Visit OT Evaluation $OT Eval Moderate Complexity: 1 Mod OT Treatments $Self Care/Home Management : 8-22 mins  Darleen Crocker, MS, OTR/L , CBIS ascom [phone]  01/07/21, 1:15 PM

## 2021-01-07 NOTE — Care Management Important Message (Signed)
Important Message  Patient Details  Name: Judith Ross MRN: [id number] Date of Birth: 04-29-1955   Medicare Important Message Given:  Yes     Dannette Barbara 01/07/2021, 1:51 PM

## 2021-01-08 DIAGNOSIS — I4891 Unspecified atrial fibrillation: Secondary | ICD-10-CM | POA: Diagnosis not present

## 2021-01-08 DIAGNOSIS — E876 Hypokalemia: Secondary | ICD-10-CM | POA: Diagnosis not present

## 2021-01-08 DIAGNOSIS — R197 Diarrhea, unspecified: Secondary | ICD-10-CM | POA: Diagnosis not present

## 2021-01-08 LAB — BASIC METABOLIC PANEL
Anion gap: 6 (ref 5–15)
BUN: <5 mg/dL — ABNORMAL LOW (ref 8–23)
CO2: 30 mmol/L (ref 22–32)
Calcium: 7.4 mg/dL — ABNORMAL LOW (ref 8.9–10.3)
Chloride: 100 mmol/L (ref 98–111)
Creatinine, Ser: 0.66 mg/dL (ref 0.44–1.00)
GFR, Estimated: >60 mL/min (ref >60)
Glucose, Bld: 68 mg/dL — ABNORMAL LOW (ref 70–99)
Potassium: 3.1 mmol/L — ABNORMAL LOW (ref 3.5–5.1)
Sodium: 136 mmol/L (ref 135–145)

## 2021-01-08 LAB — CBC
HCT: 30.3 % — ABNORMAL LOW (ref 36.0–46.0)
Hemoglobin: 10 g/dL — ABNORMAL LOW (ref 12.0–15.0)
MCH: 28.1 pg (ref 26.0–34.0)
MCHC: 33 g/dL (ref 30.0–36.0)
MCV: 85.1 fL (ref 80.0–100.0)
Platelets: 220 10*3/uL (ref 150–400)
RBC: 3.56 MIL/uL — ABNORMAL LOW (ref 3.87–5.11)
RDW: 20.8 % — ABNORMAL HIGH (ref 11.5–15.5)
WBC: 5.2 10*3/uL (ref 4.0–10.5)
nRBC: 0.8 % — ABNORMAL HIGH (ref 0.0–0.2)

## 2021-01-08 LAB — STOOL CULTURE REFLEX - CMPCXR

## 2021-01-08 LAB — STOOL CULTURE: E coli, Shiga toxin Assay: NEGATIVE

## 2021-01-08 LAB — MAGNESIUM: Magnesium: 1.8 mg/dL (ref 1.7–2.4)

## 2021-01-08 LAB — STOOL CULTURE REFLEX - RSASHR

## 2021-01-08 MED ORDER — POTASSIUM CHLORIDE 10 MEQ/100ML IV SOLN
10.0000 meq | INTRAVENOUS | Status: AC
  Administered 2021-01-08 (×5): 10 meq via INTRAVENOUS
  Filled 2021-01-08 (×10): qty 100

## 2021-01-08 MED ORDER — LOPERAMIDE HCL 2 MG PO CAPS
2.0000 mg | ORAL_CAPSULE | Freq: Two times a day (BID) | ORAL | Status: DC | PRN
  Administered 2021-01-08 – 2021-01-09 (×2): 2 mg via ORAL
  Filled 2021-01-08 (×2): qty 1

## 2021-01-08 MED ORDER — MAGNESIUM SULFATE 2 GM/50ML IV SOLN
2.0000 g | Freq: Once | INTRAVENOUS | Status: AC
  Administered 2021-01-08: 2 g via INTRAVENOUS
  Filled 2021-01-08: qty 50

## 2021-01-08 NOTE — TOC Transition Note (Signed)
Transition of Care [facility]) - CM/SW Discharge Note   Patient Details  Name: Judith Ross MRN: [id number] Date of Birth: 08-06-1955  [facility] of [facility] CM/SW Contact:  Judith Masson, RN Phone Number: 01/08/2021, 1:51 PM   Clinical Narrative:    Recommended for PT/OT with [facility]. Spoke with pt open to any agencies. Contacted [facility] left voice message. Other agency that declined services Judith Ross) and [facility] Judith Ross). Pt opt to declined out-pt therapy and states she has a grandson Personal assistant) who lives in the home. Pt verified she lives in a single family home and able to afford her medications at her local pharmacy. Pt already has a cane and RW in the home if needed.   Team updated with no additional needs at this time.   Final next level of care: Home/Self Care Barriers to Discharge: Continued Medical Work up   Patient Goals and CMS Choice        Discharge Placement                    Patient and family notified of of transfer: 01/08/21 (Declined contact with other family members)  Discharge Plan and Services                                     Social Determinants of Health (SDOH) Interventions     Readmission Risk Interventions No flowsheet data found.

## 2021-01-08 NOTE — Progress Notes (Signed)
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 fib: w/ RVR. New onset. Continue on metoprolol, xarelto. Converted w/o cardizem drip. Needs outpatient f/u w/ cardio, Dr. Saunders Revel    Nonsustained ventricular tachycardia: 1 episode.  Likely secondary to electrolyte abnormalities.    Adenocarcinoma of transverse colon: chemo as an outpatient    Meningioma: noted on CT head. Will need outpatient f/u   Hx of pulmonary embolism: continue on xarelto    Persistent hypokalemia: given IV potassium & IV magnesium today.   Diarrhea: etiology unclear. Likely etiology on severe hypokalemia. C. diff is neg. GI PCR panel is neg as well. Imodium prn    Generalized weakness: PT/OT recs HH    Fall: at home. PT/OT recs HH    Hypothyroidism: continue on home dose of levothyroxine    Suprapubic tenderness: likely secondary to UTI. Urine cx growing e. coli. Completed abx course  DVT prophylaxis: xarelto  Code Status: full  Family Communication:  Disposition Plan: likely d/c home w/ HH   Level of care: Med-Surg  Status is: Inpatient  Remains inpatient appropriate because: secondary severity of illness     Consultants:    Procedures:   Antimicrobials:   Subjective: Pt c/o 1 episode of diarrhea today so far   Objective: Vitals:   01/07/21 1938 01/08/21 0038 01/08/21 0114 01/08/21 0420  BP: 112/72 125/80  121/79  Pulse: 72 77  73  Resp: 20 20  20   Temp: 98.2 F (36.8 C) 98 F (36.7 C)  (!) 97.4 F (36.3 C)  TempSrc:  Oral  Oral  SpO2: 99% 100%  100%   Weight:   80.1 kg   Height:        Intake/Output Summary (Last 24 hours) at 01/08/2021 0744 Last data filed at 01/08/2021 0119 Gross per 24 hour  Intake 2280.1 ml  Output --  Net 2280.1 ml   Filed Weights   01/06/21 0525 01/07/21 0429 01/08/21 0114  Weight: 88.4 kg 80 kg 80.1 kg    Examination:  General exam: appears comfortable  Respiratory system: clear breath sounds b/l. No rales, wheezes  Cardiovascular system: S1/S2+. No rubs or gallops  Gastrointestinal system: abd is soft, NT, ND & hyperactive bowel sounds   Central nervous system: Alert and oriented. Moves all extremities  Psychiatry: Judgement and insight appear normal. Flat mood and affect     Data Reviewed: I have personally reviewed following labs and imaging studies  CBC: Recent Labs  Lab 01/03/21 1556 01/04/21 0250 01/05/21 0658 01/07/21 0808 01/08/21 0446  WBC 1.9* 3.4* 4.1 4.3 5.2  HGB 12.5 11.9* 11.7* 9.7* 10.0*  HCT 36.9 35.7* 34.6* 28.8* 30.3*  MCV 87.2 88.4 86.5 85.2 85.1  PLT 128* 143* 153 191 761   Basic Metabolic Panel: Recent Labs  Lab 01/03/21 1556 01/04/21 0250 01/05/21 0658 01/06/21 1856 01/07/21 0808 01/07/21 1847 01/08/21 0446  NA 137 139 136  --  138  --  136  K 2.8* 2.4* 2.5* 2.8* 2.4* 2.7* 3.1*  CL 92* 97* 97*  --  100  --  100  CO2 26 26 27   --  29  --  30  GLUCOSE 111* 73 58*  --  61*  --  68*  BUN 8 7* <5*  --  <5*  --  <5*  CREATININE 0.93 0.75 0.51  --  0.74  --  0.66  CALCIUM 8.3* 7.7* 7.8*  --  7.5*  --  7.4*  MG  --  1.6* 1.9  --  2.0  --  1.8   GFR: Estimated Creatinine Clearance: 74.8 mL/min (by C-G formula based on SCr of 0.66 mg/dL). Liver Function Tests: Recent Labs  Lab 01/03/21 1556  AST 19  ALT 14  ALKPHOS 90  BILITOT 1.8*  PROT 7.1  ALBUMIN 3.0*   No results for input(s): LIPASE, AMYLASE in the last 168 hours. No results for input(s): AMMONIA in the last 168 hours. Coagulation Profile: Recent Labs  Lab 01/03/21 1556  INR 1.3*    Cardiac Enzymes: No results for input(s): CKTOTAL, CKMB, CKMBINDEX, TROPONINI in the last 168 hours. BNP (last 3 results) No results for input(s): PROBNP in the last 8760 hours. HbA1C: No results for input(s): HGBA1C in the last 72 hours. CBG: No results for input(s): GLUCAP in the last 168 hours. Lipid Profile: No results for input(s): CHOL, HDL, LDLCALC, TRIG, CHOLHDL, LDLDIRECT in the last 72 hours. Thyroid Function Tests: No results for input(s): TSH, T4TOTAL, FREET4, T3FREE, THYROIDAB in the last 72 hours.  Anemia Panel: No results for input(s): VITAMINB12, FOLATE, FERRITIN, TIBC, IRON, RETICCTPCT in the last 72 hours. Sepsis Labs: Recent Labs  Lab 01/03/21 2215 01/04/21 0250  LATICACIDVEN 2.2* 2.3*    Recent Results (from the past 240 hour(s))  CULTURE, BLOOD (ROUTINE X 2) w Reflex to ID Panel     Status: None (Preliminary result)   Collection Time: 01/03/21 10:15 PM   Specimen: BLOOD  Result Value Ref Range Status   Specimen Description BLOOD RIGHT FOREARM  Final   Special Requests   Final    BOTTLES DRAWN AEROBIC AND ANAEROBIC Blood Culture adequate volume   Culture   Final    NO GROWTH 4 DAYS Performed at [facility], [address] [place], [place] [address]    Report Status PENDING  Incomplete  Urine Culture     Status: Abnormal   Collection Time: 01/03/21 10:20 PM   Specimen: Urine, Random  Result Value Ref Range Status   Specimen Description   Final    URINE, RANDOM Performed at [facility], [address] [place], [place] [address]    Special Requests   Final    Immunocompromised Performed at [facility], [place], [place] [address]    Culture 20,000 COLONIES/mL ESCHERICHIA COLI (A)  Final   Report Status 01/05/2021 FINAL  Final   Organism ID, Bacteria ESCHERICHIA COLI (A)  Final      Susceptibility   Escherichia coli - MIC*    AMPICILLIN 4 SENSITIVE Sensitive     CEFAZOLIN <=4 SENSITIVE  Sensitive     CEFEPIME <=0.12 SENSITIVE Sensitive     CEFTRIAXONE <=0.25 SENSITIVE Sensitive     CIPROFLOXACIN <=0.25 SENSITIVE Sensitive     GENTAMICIN <=1 SENSITIVE Sensitive     IMIPENEM <=0.25 SENSITIVE Sensitive     NITROFURANTOIN <=16 SENSITIVE Sensitive     TRIMETH/SULFA <=20 SENSITIVE Sensitive     AMPICILLIN/SULBACTAM <=2 SENSITIVE Sensitive     PIP/TAZO <=4 SENSITIVE Sensitive     * 20,000 COLONIES/mL ESCHERICHIA COLI  Stool culture (children & immunocomp patients)     Status: None (Preliminary result)   Collection Time: 01/04/21 12:29 PM   Specimen: Perirectal; Stool  Result Value Ref Range Status   Salmonella/Shigella Screen Final report  Final   Campylobacter Culture PENDING  Incomplete   E coli, Shiga toxin Assay Negative Negative Final    Comment: (NOTE) Performed At: [facility] [place], [place] [id number] Rush Farmer MD GX:[id number]   C Difficile Quick Screen w PCR reflex     Status: None   Collection Time: 01/04/21 12:29 PM   Specimen: Perirectal; Stool  Result Value Ref Range Status   C Diff antigen NEGATIVE NEGATIVE Final   C Diff toxin NEGATIVE NEGATIVE Final   C Diff interpretation No C. difficile detected.  Final    Comment: Performed at [facility], [address] [place], [place] [address]  STOOL CULTURE REFLEX - RSASHR     Status: None   Collection Time: 01/04/21 12:29 PM  Result Value Ref Range Status   Stool Culture result 1 (RSASHR) Comment  Final    Comment: (NOTE) No Salmonella or Shigella recovered. Performed At: [facility] [place], [place] [id number] Rush Farmer MD YO:[id number]   Gastrointestinal Panel by PCR , Stool     Status: None   Collection Time: 01/04/21 12:30 PM   Specimen: Stool  Result Value Ref Range Status   Campylobacter species NOT DETECTED NOT DETECTED Final   Plesimonas shigelloides NOT DETECTED NOT DETECTED Final   Salmonella species NOT DETECTED NOT  DETECTED Final   Yersinia enterocolitica NOT DETECTED NOT DETECTED Final   Vibrio species NOT DETECTED NOT DETECTED Final   Vibrio cholerae NOT DETECTED NOT DETECTED Final   Enteroaggregative E coli (EAEC) NOT DETECTED NOT DETECTED Final   Enteropathogenic E coli (EPEC) NOT DETECTED NOT DETECTED Final   Enterotoxigenic E coli (ETEC) NOT DETECTED NOT DETECTED Final   Shiga like toxin producing E coli (STEC) NOT DETECTED NOT DETECTED Final   Shigella/Enteroinvasive E coli (EIEC) NOT DETECTED NOT DETECTED Final   Cryptosporidium NOT DETECTED NOT DETECTED Final   Cyclospora cayetanensis NOT DETECTED NOT DETECTED Final   Entamoeba histolytica NOT DETECTED NOT DETECTED Final   Giardia lamblia NOT DETECTED NOT DETECTED Final   Adenovirus F40/41 NOT DETECTED NOT DETECTED Final   Astrovirus NOT DETECTED NOT DETECTED Final   Norovirus GI/GII NOT DETECTED NOT DETECTED Final   Rotavirus A NOT DETECTED NOT DETECTED Final   Sapovirus (I, II, IV, and V) NOT DETECTED NOT DETECTED Final    Comment: Performed at [facility], [address] [place], [place] [address]  Resp Panel by RT-PCR (Flu A&B, Covid) Nasopharyngeal Swab     Status: None   Collection Time: 01/04/21  4:45 PM   Specimen: Nasopharyngeal Swab; Nasopharyngeal(NP) swabs in vial transport medium  Result Value Ref Range Status   SARS Coronavirus 2 by RT PCR NEGATIVE NEGATIVE Final    Comment: (NOTE) SARS-CoV-2 target nucleic acids are NOT DETECTED.  The SARS-CoV-2 RNA is generally detectable in upper respiratory specimens during the acute phase of infection. The lowest concentration of SARS-CoV-2 viral copies this assay can detect is 138 copies/mL. A negative result does not preclude SARS-Cov-2 infection and should not be used as the sole basis for treatment or other patient management decisions. A negative result may occur with  improper specimen collection/handling, submission of specimen other than nasopharyngeal swab,  presence of viral mutation(s) within the areas  targeted by this assay, and inadequate number of viral copies(<138 copies/mL). A negative result must be combined with clinical observations, patient history, and epidemiological information. The expected result is Negative.  Fact Sheet for Patients:  EntrepreneurPulse.com.au  Fact Sheet for Healthcare Providers:  IncredibleEmployment.be  This test is no t yet approved or cleared by the [place] FDA and  has been authorized for detection and/or diagnosis of SARS-CoV-2 by FDA under an Emergency Use Authorization (EUA). This EUA will remain  in effect (meaning this test can be used) for the duration of the COVID-19 declaration under Section 564(b)(1) of the Act, 21 U.S.C.section 360bbb-3(b)(1), unless the authorization is terminated  or revoked sooner.       Influenza A by PCR NEGATIVE NEGATIVE Final   Influenza B by PCR NEGATIVE NEGATIVE Final    Comment: (NOTE) The Xpert Xpress SARS-CoV-2/FLU/RSV plus assay is intended as an aid in the diagnosis of influenza from Nasopharyngeal swab specimens and should not be used as a sole basis for treatment. Nasal washings and aspirates are unacceptable for Xpert Xpress SARS-CoV-2/FLU/RSV testing.  Fact Sheet for Patients: EntrepreneurPulse.com.au  Fact Sheet for Healthcare Providers: IncredibleEmployment.be  This test is not yet approved or cleared by the [place] FDA and has been authorized for detection and/or diagnosis of SARS-CoV-2 by FDA under an Emergency Use Authorization (EUA). This EUA will remain in effect (meaning this test can be used) for the duration of the COVID-19 declaration under Section 564(b)(1) of the Act, 21 U.S.C. section 360bbb-3(b)(1), unless the authorization is terminated or revoked.  Performed at [facility], [address] [place], [place] [address]           Radiology Studies: No results found.      Scheduled Meds:  atorvastatin  10 mg Oral QHS   Chlorhexidine Gluconate Cloth  6 each Topical Daily   levothyroxine  150 mcg Oral Q0600   metoprolol succinate  25 mg Oral Daily   rivaroxaban  20 mg Oral Q supper   Continuous Infusions:  potassium chloride       LOS: 4 days    Time spent: 15 mins     Wyvonnia Dusky, MD [facility] Pager [phone]  If 7PM-7AM, please contact night-coverage 01/08/2021, 7:44 AM

## 2021-01-09 DIAGNOSIS — R197 Diarrhea, unspecified: Secondary | ICD-10-CM | POA: Diagnosis not present

## 2021-01-09 DIAGNOSIS — I4891 Unspecified atrial fibrillation: Secondary | ICD-10-CM | POA: Diagnosis not present

## 2021-01-09 DIAGNOSIS — E876 Hypokalemia: Secondary | ICD-10-CM | POA: Diagnosis not present

## 2021-01-09 LAB — CBC
HCT: 29.3 % — ABNORMAL LOW (ref 36.0–46.0)
Hemoglobin: 10 g/dL — ABNORMAL LOW (ref 12.0–15.0)
MCH: 29.9 pg (ref 26.0–34.0)
MCHC: 34.1 g/dL (ref 30.0–36.0)
MCV: 87.7 fL (ref 80.0–100.0)
Platelets: 217 10*3/uL (ref 150–400)
RBC: 3.34 MIL/uL — ABNORMAL LOW (ref 3.87–5.11)
RDW: 20.8 % — ABNORMAL HIGH (ref 11.5–15.5)
WBC: 6.1 10*3/uL (ref 4.0–10.5)
nRBC: 1 % — ABNORMAL HIGH (ref 0.0–0.2)

## 2021-01-09 LAB — MAGNESIUM: Magnesium: 2.1 mg/dL (ref 1.7–2.4)

## 2021-01-09 LAB — BASIC METABOLIC PANEL
Anion gap: 6 (ref 5–15)
BUN: <5 mg/dL — ABNORMAL LOW (ref 8–23)
CO2: 29 mmol/L (ref 22–32)
Calcium: 7.3 mg/dL — ABNORMAL LOW (ref 8.9–10.3)
Chloride: 104 mmol/L (ref 98–111)
Creatinine, Ser: 0.68 mg/dL (ref 0.44–1.00)
GFR, Estimated: >60 mL/min (ref >60)
Glucose, Bld: 58 mg/dL — ABNORMAL LOW (ref 70–99)
Potassium: 3 mmol/L — ABNORMAL LOW (ref 3.5–5.1)
Sodium: 139 mmol/L (ref 135–145)

## 2021-01-09 MED ORDER — POTASSIUM CHLORIDE 10 MEQ/100ML IV SOLN
10.0000 meq | INTRAVENOUS | Status: AC
  Administered 2021-01-09 (×6): 10 meq via INTRAVENOUS
  Filled 2021-01-09 (×6): qty 100

## 2021-01-09 MED ORDER — SODIUM CHLORIDE 0.9% FLUSH: 10.0000 mL | INTRAVENOUS | Status: DC | PRN

## 2021-01-09 NOTE — Progress Notes (Signed)
Patient called out to desk saying she had fallen. Upon arrival to room patient was sitting on the floor in front of B[facility]. Patient was attempting to get to bedside commode without calling out for help. Patient AOx4. Patient assisted onto [facility] by RN Deneen Harts. Patient has no complaints of pain and vitals are WDL at time post fall. Patient de-accessed her [place] during fall. MD Williams aware of situation.  Completed: safety zone portal, flowsheet, huddle and all appropriate personnel notified by RN Aldona Bar.   Patient educated completed by RN Aldona Bar to call prior to trying to get out of bed so assistance can be provided to increase patient safety.

## 2021-01-09 NOTE — Progress Notes (Signed)
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 fib: w/ RVR. New onset. Continue on metoprolol, xarelto. Converted w/o cardizem drip. Needs outpatient f/u w/ cardio, Dr. Saunders Revel    Nonsustained ventricular tachycardia: 1 episode.  Likely secondary to electrolyte abnormalities.    Adenocarcinoma of transverse colon: chemo as an outpatient    Meningioma: noted on CT head. Will need outpatient f/u   Hx of pulmonary embolism: continue on xarelto    Persistent hypokalemia: giving daily IV potassium. Mg is WNL    Diarrhea: etiology unclear. Likely etiology on severe hypokalemia. C. diff is neg. GI PCR panel is neg as well. Imodium prn    Generalized weakness: PT/OT recs HH    Fall: at home. PT/OT recs HH. [place] today when trying get up alone but AA&O x4, did not hit her head. PT/OT re-consulted    Hypothyroidism: continue on home dose of levothyroxine     Suprapubic tenderness:  likely secondary to UTI. Urine cx grew e. coli. Completed abx course   DVT prophylaxis: xarelto  Code Status: full  Family Communication:  Disposition Plan: likely d/c home w/ HH   Level of care: Med-Surg  Status is: Inpatient  Remains inpatient appropriate because: secondary severity of illness, persistent electrolyte abnormalities      Consultants:    Procedures:   Antimicrobials:   Subjective: Pt c/o generalized weakness  Objective: Vitals:   01/08/21 2347 01/09/21 0450 01/09/21 0515 01/09/21 0733  BP: 120/73 134/81  128/77  Pulse: 72 65  68  Resp: 15 16  16    Temp: 97.8 F (36.6 C) 98 F (36.7 C)  98.3 F (36.8 C)  TempSrc:      SpO2: 100% 100%  100%  Weight:   82.1 kg   Height:        Intake/Output Summary (Last 24 hours) at 01/09/2021 0817 Last data filed at 01/08/2021 1345 Gross per 24 hour  Intake 360 ml  Output --  Net 360 ml   Filed Weights   01/07/21 0429 01/08/21 0114 01/09/21 0515  Weight: 80 kg 80.1 kg 82.1 kg    Examination:  General exam: appears calm & comfortable  Respiratory system: clear breath sounds b/l. No wheezes Cardiovascular system: S1/S2+. No rubs or gallops  Gastrointestinal system: Abd is soft, NT, obese & hyperactive bowel sounds  Central nervous system: alert and oriented. Moves all extremities  Psychiatry: Judgement and insight appear normal. Flat mood and affect     Data Reviewed: I have personally reviewed following labs and imaging studies  CBC: Recent Labs  Lab 01/04/21 0250 01/05/21 0658 01/07/21 0808 01/08/21 0446 01/09/21 0505  WBC 3.4* 4.1 4.3 5.2 6.1  HGB 11.9* 11.7* 9.7* 10.0* 10.0*  HCT 35.7* 34.6* 28.8* 30.3* 29.3*  MCV 88.4 86.5 85.2 85.1 87.7  PLT 143* 153 191 220 709   Basic Metabolic Panel: Recent Labs  Lab 01/04/21 0250 01/05/21 0658 01/06/21 1856 01/07/21 0808 01/07/21 1847 01/08/21 0446 01/09/21 0505  NA 139 136  --  138  --  136 139  K 2.4*  2.5* 2.8* 2.4* 2.7* 3.1* 3.0*  CL 97* 97*  --  100  --  100 104  CO2 26 27  --  29  --  30 29  GLUCOSE 73 58*  --  61*  --  68* 58*  BUN 7* <5*  --  <5*  --  <5* <5*  CREATININE 0.75 0.51  --  0.74  --  0.66 0.68  CALCIUM 7.7* 7.8*  --  7.5*  --  7.4* 7.3*  MG 1.6* 1.9  --  2.0  --  1.8 2.1   GFR: Estimated Creatinine Clearance: 75.7 mL/min (by C-G formula based on SCr of 0.68 mg/dL). Liver Function Tests: Recent Labs  Lab 01/03/21 1556  AST 19  ALT 14  ALKPHOS 90  BILITOT 1.8*  PROT 7.1  ALBUMIN 3.0*   No results for input(s): LIPASE, AMYLASE in the last 168 hours. No results for input(s): AMMONIA in  the last 168 hours. Coagulation Profile: Recent Labs  Lab 01/03/21 1556  INR 1.3*   Cardiac Enzymes: No results for input(s): CKTOTAL, CKMB, CKMBINDEX, TROPONINI in the last 168 hours. BNP (last 3 results) No results for input(s): PROBNP in the last 8760 hours. HbA1C: No results for input(s): HGBA1C in the last 72 hours. CBG: No results for input(s): GLUCAP in the last 168 hours. Lipid Profile: No results for input(s): CHOL, HDL, LDLCALC, TRIG, CHOLHDL, LDLDIRECT in the last 72 hours. Thyroid Function Tests: No results for input(s): TSH, T4TOTAL, FREET4, T3FREE, THYROIDAB in the last 72 hours.  Anemia Panel: No results for input(s): VITAMINB12, FOLATE, FERRITIN, TIBC, IRON, RETICCTPCT in the last 72 hours. Sepsis Labs: Recent Labs  Lab 01/03/21 2215 01/04/21 0250  LATICACIDVEN 2.2* 2.3*    Recent Results (from the past 240 hour(s))  CULTURE, BLOOD (ROUTINE X 2) w Reflex to ID Panel     Status: None (Preliminary result)   Collection Time: 01/03/21 10:15 PM   Specimen: BLOOD  Result Value Ref Range Status   Specimen Description BLOOD RIGHT FOREARM  Final   Special Requests   Final    BOTTLES DRAWN AEROBIC AND ANAEROBIC Blood Culture adequate volume   Culture   Final    NO GROWTH 4 DAYS Performed at [facility], [address] [place], [place] [address]    Report Status PENDING  Incomplete  Urine Culture     Status: Abnormal   Collection Time: 01/03/21 10:20 PM   Specimen: Urine, Random  Result Value Ref Range Status   Specimen Description   Final    URINE, RANDOM Performed at [facility], [address] [place], [place] [address]    Special Requests   Final    Immunocompromised Performed at [facility], [place], [place] [address]    Culture 20,000 COLONIES/mL ESCHERICHIA COLI (A)  Final   Report Status 01/05/2021 FINAL  Final   Organism ID, Bacteria ESCHERICHIA COLI (A)  Final      Susceptibility   Escherichia  coli - MIC*    AMPICILLIN 4 SENSITIVE Sensitive     CEFAZOLIN <=4 SENSITIVE Sensitive     CEFEPIME <=0.12 SENSITIVE Sensitive     CEFTRIAXONE <=0.25 SENSITIVE Sensitive     CIPROFLOXACIN <=0.25 SENSITIVE Sensitive     GENTAMICIN <=1 SENSITIVE Sensitive     IMIPENEM <=0.25 SENSITIVE Sensitive     NITROFURANTOIN <=16 SENSITIVE Sensitive     TRIMETH/SULFA <=20 SENSITIVE Sensitive     AMPICILLIN/SULBACTAM <=2 SENSITIVE Sensitive  PIP/TAZO <=4 SENSITIVE Sensitive     * 20,000 COLONIES/mL ESCHERICHIA COLI  Stool culture (children & immunocomp patients)     Status: None   Collection Time: 01/04/21 12:29 PM   Specimen: Perirectal; Stool  Result Value Ref Range Status   Salmonella/Shigella Screen Final report  Final   Campylobacter Culture Final report  Final   E coli, Shiga toxin Assay Negative Negative Final    Comment: (NOTE) Performed At: [facility] [place], [place] [id number] Rush Farmer MD [id number]   C Difficile Quick Screen w PCR reflex     Status: None   Collection Time: 01/04/21 12:29 PM   Specimen: Perirectal; Stool  Result Value Ref Range Status   C Diff antigen NEGATIVE NEGATIVE Final   C Diff toxin NEGATIVE NEGATIVE Final   C Diff interpretation No C. difficile detected.  Final    Comment: Performed at [facility], [address] [place], [place] [address]  STOOL CULTURE REFLEX - RSASHR     Status: None   Collection Time: 01/04/21 12:29 PM  Result Value Ref Range Status   Stool Culture result 1 (RSASHR) Comment  Final    Comment: (NOTE) No Salmonella or Shigella recovered. Performed At: [facility] [place], [place] [id number] Rush Farmer MD ZS:[id number]   STOOL CULTURE Reflex - CMPCXR     Status: None   Collection Time: 01/04/21 12:29 PM  Result Value Ref Range Status   Stool Culture result 1 (CMPCXR) Comment  Final    Comment: (NOTE) No Campylobacter species isolated. Performed At: [facility] [place], [place] [id number] Rush Farmer MD [id number]   Gastrointestinal Panel by PCR , Stool     Status: None   Collection Time: 01/04/21 12:30 PM   Specimen: Stool  Result Value Ref Range Status   Campylobacter species NOT DETECTED NOT DETECTED Final   Plesimonas shigelloides NOT DETECTED NOT DETECTED Final   Salmonella species NOT DETECTED NOT DETECTED Final   Yersinia enterocolitica NOT DETECTED NOT DETECTED Final   Vibrio species NOT DETECTED NOT DETECTED Final   Vibrio cholerae NOT DETECTED NOT DETECTED Final   Enteroaggregative E coli (EAEC) NOT DETECTED NOT DETECTED Final   Enteropathogenic E coli (EPEC) NOT DETECTED NOT DETECTED Final   Enterotoxigenic E coli (ETEC) NOT DETECTED NOT DETECTED Final   Shiga like toxin producing E coli (STEC) NOT DETECTED NOT DETECTED Final   Shigella/Enteroinvasive E coli (EIEC) NOT DETECTED NOT DETECTED Final   Cryptosporidium NOT DETECTED NOT DETECTED Final   Cyclospora cayetanensis NOT DETECTED NOT DETECTED Final   Entamoeba histolytica NOT DETECTED NOT DETECTED Final   Giardia lamblia NOT DETECTED NOT DETECTED Final   Adenovirus F40/41 NOT DETECTED NOT DETECTED Final   Astrovirus NOT DETECTED NOT DETECTED Final   Norovirus GI/GII NOT DETECTED NOT DETECTED Final   Rotavirus A NOT DETECTED NOT DETECTED Final   Sapovirus (I, II, IV, and V) NOT DETECTED NOT DETECTED Final    Comment: Performed at [facility], [address] [place], [place] [address]  Resp Panel by RT-PCR (Flu A&B, Covid) Nasopharyngeal Swab     Status: None   Collection Time: 01/04/21  4:45 PM   Specimen: Nasopharyngeal Swab; Nasopharyngeal(NP) swabs in vial transport medium  Result Value Ref Range Status   SARS Coronavirus 2 by RT PCR NEGATIVE NEGATIVE Final    Comment: (NOTE) SARS-CoV-2 target nucleic acids are NOT DETECTED.  The SARS-CoV-2 RNA is generally detectable  in upper respiratory specimens during the acute  phase of infection. The lowest concentration of SARS-CoV-2 viral copies this assay can detect is 138 copies/mL. A negative result does not preclude SARS-Cov-2 infection and should not be used as the sole basis for treatment or other patient management decisions. A negative result may occur with  improper specimen collection/handling, submission of specimen other than nasopharyngeal swab, presence of viral mutation(s) within the areas targeted by this assay, and inadequate number of viral copies(<138 copies/mL). A negative result must be combined with clinical observations, patient history, and epidemiological information. The expected result is Negative.  Fact Sheet for Patients:  EntrepreneurPulse.com.au  Fact Sheet for Healthcare Providers:  IncredibleEmployment.be  This test is no t yet approved or cleared by the [place] FDA and  has been authorized for detection and/or diagnosis of SARS-CoV-2 by FDA under an Emergency Use Authorization (EUA). This EUA will remain  in effect (meaning this test can be used) for the duration of the COVID-19 declaration under Section 564(b)(1) of the Act, 21 U.S.C.section 360bbb-3(b)(1), unless the authorization is terminated  or revoked sooner.       Influenza A by PCR NEGATIVE NEGATIVE Final   Influenza B by PCR NEGATIVE NEGATIVE Final    Comment: (NOTE) The Xpert Xpress SARS-CoV-2/FLU/RSV plus assay is intended as an aid in the diagnosis of influenza from Nasopharyngeal swab specimens and should not be used as a sole basis for treatment. Nasal washings and aspirates are unacceptable for Xpert Xpress SARS-CoV-2/FLU/RSV testing.  Fact Sheet for Patients: EntrepreneurPulse.com.au  Fact Sheet for Healthcare Providers: IncredibleEmployment.be  This test is not yet approved or cleared by the [place] FDA and has been authorized for detection and/or diagnosis of  SARS-CoV-2 by FDA under an Emergency Use Authorization (EUA). This EUA will remain in effect (meaning this test can be used) for the duration of the COVID-19 declaration under Section 564(b)(1) of the Act, 21 U.S.C. section 360bbb-3(b)(1), unless the authorization is terminated or revoked.  Performed at [facility], [address] [place], [place] [address]          Radiology Studies: No results found.      Scheduled Meds:  atorvastatin  10 mg Oral QHS   Chlorhexidine Gluconate Cloth  6 each Topical Daily   levothyroxine  150 mcg Oral Q0600   metoprolol succinate  25 mg Oral Daily   rivaroxaban  20 mg Oral Q supper   Continuous Infusions:  potassium chloride       LOS: 5 days    Time spent: 15 mins     Wyvonnia Dusky, MD [facility] Pager [phone]  If 7PM-7AM, please contact night-coverage 01/09/2021, 8:17 AM

## 2021-01-10 DIAGNOSIS — I4891 Unspecified atrial fibrillation: Secondary | ICD-10-CM | POA: Diagnosis not present

## 2021-01-10 DIAGNOSIS — E876 Hypokalemia: Secondary | ICD-10-CM | POA: Diagnosis not present

## 2021-01-10 DIAGNOSIS — R197 Diarrhea, unspecified: Secondary | ICD-10-CM | POA: Diagnosis not present

## 2021-01-10 LAB — BASIC METABOLIC PANEL
Anion gap: 13 (ref 5–15)
BUN: 5 mg/dL — ABNORMAL LOW (ref 8–23)
CO2: 28 mmol/L (ref 22–32)
Calcium: 7.3 mg/dL — ABNORMAL LOW (ref 8.9–10.3)
Chloride: 96 mmol/L — ABNORMAL LOW (ref 98–111)
Creatinine, Ser: 0.67 mg/dL (ref 0.44–1.00)
GFR, Estimated: >60 mL/min (ref >60)
Glucose, Bld: 62 mg/dL — ABNORMAL LOW (ref 70–99)
Potassium: 3.4 mmol/L — ABNORMAL LOW (ref 3.5–5.1)
Sodium: 137 mmol/L (ref 135–145)

## 2021-01-10 LAB — CBC
HCT: 33.4 % — ABNORMAL LOW (ref 36.0–46.0)
Hemoglobin: 11.2 g/dL — ABNORMAL LOW (ref 12.0–15.0)
MCH: 28.9 pg (ref 26.0–34.0)
MCHC: 33.5 g/dL (ref 30.0–36.0)
MCV: 86.1 fL (ref 80.0–100.0)
Platelets: 276 10*3/uL (ref 150–400)
RBC: 3.88 MIL/uL (ref 3.87–5.11)
RDW: 21.1 % — ABNORMAL HIGH (ref 11.5–15.5)
WBC: 9 10*3/uL (ref 4.0–10.5)
nRBC: 0.6 % — ABNORMAL HIGH (ref 0.0–0.2)

## 2021-01-10 LAB — GLUCOSE, CAPILLARY
Glucose-Capillary: 58 mg/dL — ABNORMAL LOW (ref 70–99)
Glucose-Capillary: 129 mg/dL — ABNORMAL HIGH (ref 70–99)
Glucose-Capillary: 74 mg/dL (ref 70–99)
Glucose-Capillary: 71 mg/dL (ref 70–99)
Glucose-Capillary: 101 mg/dL — ABNORMAL HIGH (ref 70–99)

## 2021-01-10 LAB — CULTURE, BLOOD (ROUTINE X 2)
Culture: NO GROWTH
Special Requests: ADEQUATE

## 2021-01-10 LAB — MAGNESIUM: Magnesium: 2 mg/dL (ref 1.7–2.4)

## 2021-01-10 MED ORDER — POTASSIUM CHLORIDE 10 MEQ/100ML IV SOLN
10.0000 meq | INTRAVENOUS | Status: AC
Start: 2021-01-10 — End: 2021-01-10
  Administered 2021-01-10 (×3): 10 meq via INTRAVENOUS
  Filled 2021-01-10 (×3): qty 100

## 2021-01-10 MED ORDER — DEXTROSE 50 % IV SOLN
1.0000 | Freq: Once | INTRAVENOUS | Status: AC
  Administered 2021-01-10: 50 mL via INTRAVENOUS
  Filled 2021-01-10: qty 50

## 2021-01-10 MED ORDER — DEXTROSE-NACL 5-0.9 % IV SOLN: INTRAVENOUS | Status: DC

## 2021-01-10 MED ORDER — MAGIC MOUTHWASH
15.0000 mL | Freq: Three times a day (TID) | ORAL | Status: DC
  Administered 2021-01-10 – 2021-01-11 (×2): 15 mL via ORAL
  Filled 2021-01-10 (×4): qty 20

## 2021-01-10 NOTE — TOC Initial Note (Signed)
Transition of Care [facility]) - Initial/Assessment Note    Patient Details  Name: Judith Ross MRN: [id number] Date of Birth: 1956/02/24  Transition of Care [facility]) CM/SW Contact:    Alberteen Sam, LCSW Phone Number: 01/10/2021, 1:47 PM  Clinical Narrative:                  CSW spoke with patient who is agreeable to home health services, CSW has set patient up with [facility] for PT, OT and RN.   Patient identifies no DME needs as she has a cane and walker at home.   No other discharge needs identified.  Expected Discharge Plan: [place] Barriers to Discharge: Continued Medical Work up   Patient Goals and CMS Choice Patient states their goals for this hospitalization and ongoing recovery are:: to go home CMS Medicare.gov Compare Post Acute Care list provided to:: Patient Choice offered to / list presented to : Patient  Expected Discharge Plan and Services Expected Discharge Plan: [place] Choice: [place] arrangements for the past 2 months: Single Family Home                           HH Arranged: PT, OT, RN [place] [facility] Agency: [facility]) Date HH Agency Contacted: 01/10/21 Time HH Agency Contacted: [place] Representative spoke with at [facility]: Corene Cornea  Prior Living Arrangements/Services Living arrangements for the past 2 months: [place] Lives with:: Relatives Patient language and need for interpreter reviewed:: Yes Do you feel safe going back to the place where you live?: Yes      Need for Family Participation in Patient Care: Yes (Comment) Care giver support system in place?: Yes (comment)   Criminal Activity/Legal Involvement Pertinent to Current Situation/Hospitalization: No - Comment as needed  Activities of Daily Living Home Assistive Devices/Equipment: [place] (specify type), Ostomy supplies ADL Screening (condition at time of admission) Patient's cognitive  ability adequate to safely complete daily activities?: Yes Is the patient deaf or have difficulty hearing?: No Does the patient have difficulty seeing, even when wearing glasses/contacts?: No Does the patient have difficulty concentrating, remembering, or making decisions?: No Patient able to express need for assistance with ADLs?: Yes Does the patient have difficulty dressing or bathing?: No Independently performs ADLs?: Yes (appropriate for developmental age) Does the patient have difficulty walking or climbing stairs?: Yes Weakness of Legs: Both Weakness of Arms/Hands: None  Permission Sought/Granted Permission sought to share information with : Case Manager, Customer service manager, Family Supports Permission granted to share information with : Yes, Verbal Permission Granted     Permission granted to share info w AGENCY: HH        Emotional Assessment Appearance:: Appears stated age Attitude/Demeanor/Rapport: Gracious Affect (typically observed): Calm Orientation: : Oriented to Self, Oriented to Place, Oriented to  Time, Oriented to Situation Alcohol / Substance Use: Not Applicable Psych Involvement: No (comment)  Admission diagnosis:  Dehydration [E86.0] New onset atrial fibrillation ([place]) [I48.91] Atrial fibrillation with rapid ventricular response ([place]) [I48.91] Atrial fibrillation with RVR ([place]) [I48.91] Hypotension, unspecified hypotension type [I95.9] Patient Active Problem List   Diagnosis Date Noted   Atrial fibrillation with RVR ([place]) 01/04/2021   Atrial fibrillation with rapid ventricular response ([place]) 01/03/2021   Meningioma ([place]) 01/03/2021   Current use of long term anticoagulation 01/03/2021   History of pulmonary embolism 01/03/2021  Hypokalemia 01/03/2021   Diarrhea 01/03/2021   Non-sustained ventricular tachycardia 01/03/2021   Acute deep vein thrombosis (DVT) of right peroneal vein ([place]) 09/28/2020   Chemotherapy induced neutropenia ([place])  09/28/2020   Acute pulmonary embolism ([place]) 09/18/2020   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[place]) 08/18/2020   Abdominal pain 07/14/2020   Prediabetes 10/08/2014   Essential hypertension 05/11/2014   HLD (hyperlipidemia) 05/11/2014   Hypothyroidism 05/11/2014   Arthritis 05/11/2014   Obstructive sleep apnea 05/11/2014   PCP:  Venia Carbon, MD Pharmacy:   [id number] [address], [place] [address] [place] [address] Phone: [phone] Fax: [phone]  [facility] [place], [place] [address] [place] [address] Phone: [phone] Fax: [phone]     Social Determinants of Health ([place]) Interventions    Readmission Risk Interventions No flowsheet data found.

## 2021-01-10 NOTE — Progress Notes (Signed)
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)  Fall: at home. PT/OT recs HH. Fell on 10/23 when trying get up alone but AA&OX4 & did not hit her head. Bed alarm on. PT needs to see pt again today   Persistent hypokalemia: improved today but still low. Will give IV potassium. Mg is WNL   Hypoglycemic episode: likely secondary to poor appetite. Encourage po intake. S/p D50 x1. Will continue to monitor   A. fib: w/ RVR. New onset. Continue on metoprolol, xarelto. Converted w/o cardizem drip. Needs outpatient f/u w/ cardio, Dr. Saunders Revel    Nonsustained ventricular tachycardia: 1 episode.  Likely secondary to electrolyte abnormalities.    Adenocarcinoma of transverse colon: chemo as an outpatient    Meningioma: noted on CT head. Will need outpatient f/u   Hx of pulmonary embolism: continue on xarelto   Diarrhea: etiology unclear. Likely etiology on severe hypokalemia. C. diff is neg. GI PCR panel is neg as well. Imodium prn    Generalized weakness:  PT/OT recs HH    Hypothyroidism: continue on home dose of levothyroxine    Suprapubic tenderness:  likely secondary to UTI. Urine cx grew e. coli. Completed abx course   DVT prophylaxis: xarelto  Code Status: full  Family Communication:  Disposition Plan: likely d/c home w/ HH   Level of care: Med-Surg  Status is: Inpatient  Remains inpatient appropriate because: secondary severity of illness, persistent electrolyte abnormalities      Consultants:    Procedures:   Antimicrobials:   Subjective: Pt c/o  fatigue  Objective: Vitals:   01/09/21 1924 01/10/21 0007 01/10/21 0227 01/10/21 0253  BP: 111/75 118/77 122/80   Pulse: 69 69 66   Resp:  20 20   Temp: 98 F (36.7 C) (!) 97.4 F (36.3 C) 98.5 F (36.9 C)   TempSrc: Oral     SpO2: 100% 99% 100%   Weight:    82 kg  Height:        Intake/Output Summary (Last 24 hours) at 01/10/2021 0750 Last data filed at 01/09/2021 1854 Gross per 24 hour  Intake 840 ml  Output --  Net 840 ml   Filed Weights   01/08/21 0114 01/09/21 0515 01/10/21 0253  Weight: 80.1 kg 82.1 kg 82 kg    Examination:  General exam: Appears comfortable  Respiratory system: clear breath sounds b/l  Cardiovascular system: S1 & S2+. No rubs or clicks  Gastrointestinal system: Abd is soft, NT, ND & normal bowel sounds  Central nervous system: Alert and oriented. Moves all extremities  Psychiatry: Judgement and insight appear normal. Flat mood and affect    Data Reviewed: I have personally reviewed following labs and imaging studies  CBC: Recent Labs  Lab 01/05/21 0658 01/07/21 0808 01/08/21 0446 01/09/21 0505 01/10/21 0531  WBC 4.1 4.3 5.2 6.1 9.0  HGB 11.7* 9.7* 10.0* 10.0* 11.2*  HCT 34.6* 28.8* 30.3* 29.3* 33.4*  MCV 86.5 85.2 85.1 87.7 86.1  PLT 153 191 220 217 417   Basic Metabolic Panel: Recent Labs  Lab 01/05/21 0658 01/06/21  1856 01/07/21 0808 01/07/21 1847 01/08/21 0446 01/09/21 0505 01/10/21 0531  NA 136  --  138  --  136 139 137  K 2.5*   < > 2.4* 2.7* 3.1* 3.0* 3.4*  CL 97*  --  100  --  100 104 96*  CO2 27  --  29  --  30 29 28   GLUCOSE 58*  --  61*  --  68* 58* 62*  BUN <5*  --  <5*  --  <5* <5* 5*  CREATININE 0.51  --  0.74  --  0.66 0.68 0.67  CALCIUM 7.8*  --  7.5*  --  7.4* 7.3* 7.3*  MG 1.9  --  2.0  --  1.8 2.1 2.0   < > = values in this interval not displayed.   GFR: Estimated Creatinine Clearance: 75.7 mL/min (by C-G formula based on SCr of 0.67 mg/dL). Liver Function Tests: Recent Labs  Lab 01/03/21 1556   AST 19  ALT 14  ALKPHOS 90  BILITOT 1.8*  PROT 7.1  ALBUMIN 3.0*   No results for input(s): LIPASE, AMYLASE in the last 168 hours. No results for input(s): AMMONIA in the last 168 hours. Coagulation Profile: Recent Labs  Lab 01/03/21 1556  INR 1.3*   Cardiac Enzymes: No results for input(s): CKTOTAL, CKMB, CKMBINDEX, TROPONINI in the last 168 hours. BNP (last 3 results) No results for input(s): PROBNP in the last 8760 hours. HbA1C: No results for input(s): HGBA1C in the last 72 hours. CBG: No results for input(s): GLUCAP in the last 168 hours. Lipid Profile: No results for input(s): CHOL, HDL, LDLCALC, TRIG, CHOLHDL, LDLDIRECT in the last 72 hours. Thyroid Function Tests: No results for input(s): TSH, T4TOTAL, FREET4, T3FREE, THYROIDAB in the last 72 hours.  Anemia Panel: No results for input(s): VITAMINB12, FOLATE, FERRITIN, TIBC, IRON, RETICCTPCT in the last 72 hours. Sepsis Labs: Recent Labs  Lab 01/03/21 2215 01/04/21 0250  LATICACIDVEN 2.2* 2.3*    Recent Results (from the past 240 hour(s))  CULTURE, BLOOD (ROUTINE X 2) w Reflex to ID Panel     Status: None (Preliminary result)   Collection Time: 01/03/21 10:15 PM   Specimen: BLOOD  Result Value Ref Range Status   Specimen Description BLOOD RIGHT FOREARM  Final   Special Requests   Final    BOTTLES DRAWN AEROBIC AND ANAEROBIC Blood Culture adequate volume   Culture   Final    NO GROWTH 4 DAYS Performed at [facility], [address], [place], [place] [address]    Report Status PENDING  Incomplete  Urine Culture     Status: Abnormal   Collection Time: 01/03/21 10:20 PM   Specimen: Urine, Random  Result Value Ref Range Status   Specimen Description   Final    URINE, RANDOM Performed at [facility], [address], [place], [place] [address]    Special Requests   Final    Immunocompromised Performed at [facility], [place], [place] [address]    Culture  20,000 COLONIES/mL ESCHERICHIA COLI (A)  Final   Report Status 01/05/2021 FINAL  Final   Organism ID, Bacteria ESCHERICHIA COLI (A)  Final      Susceptibility   Escherichia coli - MIC*    AMPICILLIN 4 SENSITIVE Sensitive     CEFAZOLIN <=4 SENSITIVE Sensitive     CEFEPIME <=0.12 SENSITIVE Sensitive     CEFTRIAXONE <=0.25 SENSITIVE Sensitive     CIPROFLOXACIN <=0.25 SENSITIVE Sensitive  GENTAMICIN <=1 SENSITIVE Sensitive     IMIPENEM <=0.25 SENSITIVE Sensitive     NITROFURANTOIN <=16 SENSITIVE Sensitive     TRIMETH/SULFA <=20 SENSITIVE Sensitive     AMPICILLIN/SULBACTAM <=2 SENSITIVE Sensitive     PIP/TAZO <=4 SENSITIVE Sensitive     * 20,000 COLONIES/mL ESCHERICHIA COLI  Stool culture (children & immunocomp patients)     Status: None   Collection Time: 01/04/21 12:29 PM   Specimen: Perirectal; Stool  Result Value Ref Range Status   Salmonella/Shigella Screen Final report  Final   Campylobacter Culture Final report  Final   E coli, Shiga toxin Assay Negative Negative Final    Comment: (NOTE) Performed At: [facility] [place], [place] [id number] Rush Farmer MD WU:[id number]   C Difficile Quick Screen w PCR reflex     Status: None   Collection Time: 01/04/21 12:29 PM   Specimen: Perirectal; Stool  Result Value Ref Range Status   C Diff antigen NEGATIVE NEGATIVE Final   C Diff toxin NEGATIVE NEGATIVE Final   C Diff interpretation No C. difficile detected.  Final    Comment: Performed at [facility], [address] [place], [place] [address]  STOOL CULTURE REFLEX - RSASHR     Status: None   Collection Time: 01/04/21 12:29 PM  Result Value Ref Range Status   Stool Culture result 1 (RSASHR) Comment  Final    Comment: (NOTE) No Salmonella or Shigella recovered. Performed At: [facility] [place], [place] [id number] Rush Farmer MD [id number]   STOOL CULTURE Reflex - CMPCXR     Status: None   Collection Time:  01/04/21 12:29 PM  Result Value Ref Range Status   Stool Culture result 1 (CMPCXR) Comment  Final    Comment: (NOTE) No Campylobacter species isolated. Performed At: [facility] [place], [place] [id number] Rush Farmer MD [id number]   Gastrointestinal Panel by PCR , Stool     Status: None   Collection Time: 01/04/21 12:30 PM   Specimen: Stool  Result Value Ref Range Status   Campylobacter species NOT DETECTED NOT DETECTED Final   Plesimonas shigelloides NOT DETECTED NOT DETECTED Final   Salmonella species NOT DETECTED NOT DETECTED Final   Yersinia enterocolitica NOT DETECTED NOT DETECTED Final   Vibrio species NOT DETECTED NOT DETECTED Final   Vibrio cholerae NOT DETECTED NOT DETECTED Final   Enteroaggregative E coli (EAEC) NOT DETECTED NOT DETECTED Final   Enteropathogenic E coli (EPEC) NOT DETECTED NOT DETECTED Final   Enterotoxigenic E coli (ETEC) NOT DETECTED NOT DETECTED Final   Shiga like toxin producing E coli (STEC) NOT DETECTED NOT DETECTED Final   Shigella/Enteroinvasive E coli (EIEC) NOT DETECTED NOT DETECTED Final   Cryptosporidium NOT DETECTED NOT DETECTED Final   Cyclospora cayetanensis NOT DETECTED NOT DETECTED Final   Entamoeba histolytica NOT DETECTED NOT DETECTED Final   Giardia lamblia NOT DETECTED NOT DETECTED Final   Adenovirus F40/41 NOT DETECTED NOT DETECTED Final   Astrovirus NOT DETECTED NOT DETECTED Final   Norovirus GI/GII NOT DETECTED NOT DETECTED Final   Rotavirus A NOT DETECTED NOT DETECTED Final   Sapovirus (I, II, IV, and V) NOT DETECTED NOT DETECTED Final    Comment: Performed at [facility], [address] [place], [place] [address]  Resp Panel by RT-PCR (Flu A&B, Covid) Nasopharyngeal Swab     Status: None   Collection Time: 01/04/21  4:45 PM   Specimen: Nasopharyngeal Swab; Nasopharyngeal(NP) swabs  in vial transport medium  Result Value Ref Range Status   SARS Coronavirus 2 by RT PCR NEGATIVE NEGATIVE  Final    Comment: (NOTE) SARS-CoV-2 target nucleic acids are NOT DETECTED.  The SARS-CoV-2 RNA is generally detectable in upper respiratory specimens during the acute phase of infection. The lowest concentration of SARS-CoV-2 viral copies this assay can detect is 138 copies/mL. A negative result does not preclude SARS-Cov-2 infection and should not be used as the sole basis for treatment or other patient management decisions. A negative result may occur with  improper specimen collection/handling, submission of specimen other than nasopharyngeal swab, presence of viral mutation(s) within the areas targeted by this assay, and inadequate number of viral copies(<138 copies/mL). A negative result must be combined with clinical observations, patient history, and epidemiological information. The expected result is Negative.  Fact Sheet for Patients:  EntrepreneurPulse.com.au  Fact Sheet for Healthcare Providers:  IncredibleEmployment.be  This test is no t yet approved or cleared by the [place] FDA and  has been authorized for detection and/or diagnosis of SARS-CoV-2 by FDA under an Emergency Use Authorization (EUA). This EUA will remain  in effect (meaning this test can be used) for the duration of the COVID-19 declaration under Section 564(b)(1) of the Act, 21 U.S.C.section 360bbb-3(b)(1), unless the authorization is terminated  or revoked sooner.       Influenza A by PCR NEGATIVE NEGATIVE Final   Influenza B by PCR NEGATIVE NEGATIVE Final    Comment: (NOTE) The Xpert Xpress SARS-CoV-2/FLU/RSV plus assay is intended as an aid in the diagnosis of influenza from Nasopharyngeal swab specimens and should not be used as a sole basis for treatment. Nasal washings and aspirates are unacceptable for Xpert Xpress SARS-CoV-2/FLU/RSV testing.  Fact Sheet for Patients: EntrepreneurPulse.com.au  Fact Sheet for Healthcare  Providers: IncredibleEmployment.be  This test is not yet approved or cleared by the [place] FDA and has been authorized for detection and/or diagnosis of SARS-CoV-2 by FDA under an Emergency Use Authorization (EUA). This EUA will remain in effect (meaning this test can be used) for the duration of the COVID-19 declaration under Section 564(b)(1) of the Act, 21 U.S.C. section 360bbb-3(b)(1), unless the authorization is terminated or revoked.  Performed at [facility], [address], [place],  [address]          Radiology Studies: No results found.      Scheduled Meds:  atorvastatin  10 mg Oral QHS   Chlorhexidine Gluconate Cloth  6 each Topical Daily   levothyroxine  150 mcg Oral Q0600   metoprolol succinate  25 mg Oral Daily   rivaroxaban  20 mg Oral Q supper   Continuous Infusions:  potassium chloride       LOS: 6 days    Time spent: 15 mins     Wyvonnia Dusky, MD [facility] Pager [phone]  If 7PM-7AM, please contact night-coverage 01/10/2021, 7:50 AM

## 2021-01-10 NOTE — Progress Notes (Signed)
Physical Therapy Treatment Patient Details Name: TASHEIKA KITZMILLER MRN: [id number] DOB: 1956/02/01 Today's Date: 01/10/2021   History of Present Illness 65 y.o. female with medical history significant for colon cancer (diagnosed 08/18/20) currently undergoing treatment (last chemo 12/22/20), pulmonary embolism July 2022 (first ever PE; now on Xarelto), hypothyroidism, hypertension, hyperlipidemia, who presents to the emergency department on 01/03/2021 with generalized weakness. She fell on the day of admission at home when she was trying to get out of bed; she felt dizzy before and after she fell. She did hit her head on the right side.    PT Comments    Pt seen for PT treatment. Pt received in bed with all lights off & room dark but pt agreeable to tx. Pt is able to complete bed mobility with mod I, STS with CGA. Pt ambulates 1 lap around nurses station with CGA increasing to min assist as pt had 2 instances of scissoring gait but pt with poor insight re: this & how it impairs safety. PT educated pt on recommendation of supervision upon d/c with pt noting she lives with her grandson who works during the day & she will likely be in the bed anyway. PT educated pt on importance of OOB mobility to prevent further weakness & increasing fall risk. Pt voices understanding. Pt declines walking more than 1 lap but reports it's because she doesn't want to, vs fatigue. Pt left in bed with pt requesting all lights off.    Recommendations for follow up therapy are one component of a multi-disciplinary discharge planning process, led by the attending physician.  Recommendations may be updated based on patient status, additional functional criteria and insurance authorization.  Follow Up Recommendations  Home health PT     Assistance Recommended at Discharge Intermittent Supervision/Assistance  Equipment Recommendations  Rolling walker (2 wheels)    Recommendations for Other Services       Precautions /  Restrictions Precautions Precautions: Fall Restrictions Weight Bearing Restrictions: No     Mobility  Bed Mobility Overal bed mobility: Needs Assistance Bed Mobility: Supine to Sit;Sit to Supine     Supine to sit: Modified independent (Device/Increase time);HOB elevated Sit to supine: Modified independent (Device/Increase time);HOB elevated        Transfers Overall transfer level: Needs assistance Equipment used: Rolling walker (2 wheels) Transfers: Sit to/from Stand Sit to Stand: Min guard                Ambulation/Gait Ambulation/Gait assistance: Min guard;Min assist Gait Distance (Feet): 160 Feet Assistive device: Rolling walker (2 wheels)   Gait velocity: slightly decreased   General Gait Details: Pt ambulates 1 lap around nurses station with RW & CGA except min assist on 2 occasions 2/2 scissoring gait & impaired balance with pt demonstrating decreased awareness of this and doesn't appear phased by it.   Stairs             Wheelchair Mobility    Modified Rankin (Stroke Patients Only)       Balance Overall balance assessment: Needs assistance Sitting-balance support: Feet supported;No upper extremity supported Sitting balance-Leahy Scale: Good     Standing balance support: During functional activity;Bilateral upper extremity supported Standing balance-Leahy Scale: Poor                              Cognition Arousal/Alertness: Awake/alert Behavior During Therapy: Flat affect Overall Cognitive Status: No family/caregiver present to determine baseline cognitive functioning  General Comments: Appears to have decreased awareness, unable to recall what happened to cause fall over the weekend, appears somewhat apathetic.        Exercises Other Exercises Other Exercises: Pt educ re: role of OT, d/c recs, ECS, falls pcns, importance of safe mvmt Other Exercises: Pt sup<>sit, sit<> stand,  toilet transfer w/ pericare    General Comments General comments (skin integrity, edema, etc.): HR 78-88 bpm      Pertinent Vitals/Pain Pain Assessment: No/denies pain    Home Living                          Prior Function            PT Goals (current goals can now be found in the care plan section) Acute Rehab PT Goals Patient Stated Goal: to return home PT Goal Formulation: With patient Time For Goal Achievement: 01/21/21 Potential to Achieve Goals: Good Progress towards PT goals: Progressing toward goals    Frequency    Min 2X/week      PT Plan Current plan remains appropriate    Co-evaluation              AM-PAC PT "6 Clicks" Mobility   Outcome Measure  Help needed turning from your back to your side while in a flat bed without using bedrails?: None Help needed moving from lying on your back to sitting on the side of a flat bed without using bedrails?: None Help needed moving to and from a bed to a chair (including a wheelchair)?: A Little Help needed standing up from a chair using your arms (e.g., wheelchair or bedside chair)?: A Little Help needed to walk in hospital room?: A Little Help needed climbing 3-5 steps with a railing? : A Lot 6 Click Score: 19    End of Session   Activity Tolerance:  (Pt self limiting) Patient left: in bed;with call bell/phone within reach;with bed alarm set Nurse Communication: Mobility status PT Visit Diagnosis: Unsteadiness on feet (R26.81);Muscle weakness (generalized) (M62.81)     Time: 2248-2500 PT Time Calculation (min) (ACUTE ONLY): 9 min  Charges:  $Therapeutic Activity: 8-22 mins                     Lavone Nian, PT, DPT 01/10/21, 3:27 PM    Waunita Schooner 01/10/2021, 3:24 PM

## 2021-01-10 NOTE — Care Management Important Message (Signed)
Important Message  Patient Details  Name: Judith Ross MRN: [id number] Date of Birth: Aug 26, 1955   Medicare Important Message Given:  Yes     Dannette Barbara 01/10/2021, 4:06 PM

## 2021-01-10 NOTE — Progress Notes (Signed)
Occupational Therapy Treatment Patient Details Name: Judith Ross MRN: [id number] DOB: 05/29/1955 Today's Date: 01/10/2021   History of present illness 65 y.o. female with medical history significant for colon cancer (diagnosed 08/18/20) currently undergoing treatment (last chemo 12/22/20), pulmonary embolism July 2022 (first ever PE; now on Xarelto), hypothyroidism, hypertension, hyperlipidemia, who presents to the emergency department on 01/03/2021 with generalized weakness. She fell on the day of admission at home when she was trying to get out of bed; she felt dizzy before and after she fell. She did hit her head on the right side.   OT comments  Ms. Louis seen for OT treatment on this date. Upon arrival to room pt awake/alert with room in almost complete darkness. Pt seated upright in bed and agreeable to tx. Pt instructed in importance of ECS and safely during functional mobility 2/2 recent fall. Pt requires SUP + RW for toilet t/f, ~ 5 ft to [facility] Pt needed SETUP to obtain items needed for perihygene, SUP + RW for completing perihygeine, seated/ standing at [facility] Pt making good progress toward goals. Pt continues to benefit from skilled OT services to maximize return to PLOF and minimize risk of future falls, injury, caregiver burden, and readmission. Will continue to follow POC. Discharge recommendation remains appropriate.     Recommendations for follow up therapy are one component of a multi-disciplinary discharge planning process, led by the attending physician.  Recommendations may be updated based on patient status, additional functional criteria and insurance authorization.    Follow Up Recommendations  Home health OT    Assistance Recommended at Discharge Intermittent Supervision/Assistance  Equipment Recommendations  [facility]    Recommendations for Other Services      Precautions / Restrictions Precautions Precautions: Fall Restrictions Weight Bearing Restrictions: No        Mobility Bed Mobility Overal bed mobility: Needs Assistance Bed Mobility: Supine to Sit;Sit to Supine     Supine to sit: Supervision Sit to supine: Supervision        Transfers Overall transfer level: Needs assistance Equipment used: Rolling walker (2 wheels) Transfers: Sit to/from Stand Sit to Stand: Supervision                 Balance Overall balance assessment: Needs assistance Sitting-balance support: Feet supported;No upper extremity supported Sitting balance-Leahy Scale: Good     Standing balance support: Single extremity supported;During functional activity                               ADL either performed or assessed with clinical judgement   ADL Overall ADL's : Needs assistance/impaired                                       General ADL Comments: Pt required SUP + RW for toilet t/f, ~ 5 ft to [facility]. Pt needed SETUP to obtain needed for perihygene, SUP + RW for completing perihygeine, seated/ standing at [facility].      Cognition Arousal/Alertness: Awake/alert Behavior During Therapy: WFL for tasks assessed/performed Overall Cognitive Status: Within Functional Limits for tasks assessed                                            Exercises Exercises: Other exercises Other  Exercises Other Exercises: Pt educ re: role of OT, d/c recs, ECS, falls pcns, importance of safe mvmt Other Exercises: Pt sup<>sit, sit<> stand, toilet transfer w/ pericare   Shoulder Instructions       General Comments      Pertinent Vitals/ Pain       Pain Assessment: No/denies pain  Home Living                                          Prior Functioning/Environment              Frequency  Min 2X/week        Progress Toward Goals  OT Goals(current goals can now be found in the care plan section)  Progress towards OT goals: Progressing toward goals  Acute Rehab OT Goals OT Goal Formulation: With  patient/family Time For Goal Achievement: 01/21/21 Potential to Achieve Goals: Good ADL Goals Pt Will Perform Grooming: with modified independence;standing Pt Will Perform Lower Body Dressing: with supervision;sit to/from stand Pt Will Transfer to Toilet: with supervision;ambulating Pt Will Perform Toileting - Clothing Manipulation and hygiene: with supervision;sit to/from stand  Plan Discharge plan remains appropriate    Co-evaluation                 AM-PAC OT "6 Clicks" Daily Activity     Outcome Measure   Help from another person eating meals?: None Help from another person taking care of personal grooming?: A Little Help from another person toileting, which includes using toliet, bedpan, or urinal?: A Little Help from another person bathing (including washing, rinsing, drying)?: A Little Help from another person to put on and taking off regular upper body clothing?: None Help from another person to put on and taking off regular lower body clothing?: A Little 6 Click Score: 20    End of Session Equipment Utilized During Treatment: Rolling walker (2 wheels)  OT Visit Diagnosis: Unsteadiness on feet (R26.81);Repeated falls (R29.6);Muscle weakness (generalized) (M62.81);History of falling (Z91.81)   Activity Tolerance Patient tolerated treatment well   Patient Left in bed;with call bell/phone within reach;with bed alarm set   Nurse Communication          Time: 8592-9244 OT Time Calculation (min): 25 min  Charges: OT Treatments $Self Care/Home Management : 23-37 mins  Nino Glow, Markus Daft 01/10/2021, 12:08 PM

## 2021-01-11 DIAGNOSIS — C184 Malignant neoplasm of transverse colon: Secondary | ICD-10-CM | POA: Diagnosis not present

## 2021-01-11 DIAGNOSIS — E876 Hypokalemia: Secondary | ICD-10-CM | POA: Diagnosis not present

## 2021-01-11 DIAGNOSIS — I4891 Unspecified atrial fibrillation: Secondary | ICD-10-CM | POA: Diagnosis not present

## 2021-01-11 LAB — CBC
HCT: 32.6 % — ABNORMAL LOW (ref 36.0–46.0)
Hemoglobin: 10.7 g/dL — ABNORMAL LOW (ref 12.0–15.0)
MCH: 28.2 pg (ref 26.0–34.0)
MCHC: 32.8 g/dL (ref 30.0–36.0)
MCV: 85.8 fL (ref 80.0–100.0)
Platelets: 253 10*3/uL (ref 150–400)
RBC: 3.8 MIL/uL — ABNORMAL LOW (ref 3.87–5.11)
RDW: 21.2 % — ABNORMAL HIGH (ref 11.5–15.5)
WBC: 8.8 10*3/uL (ref 4.0–10.5)
nRBC: 0.7 % — ABNORMAL HIGH (ref 0.0–0.2)

## 2021-01-11 LAB — BASIC METABOLIC PANEL
Anion gap: 9 (ref 5–15)
BUN: 6 mg/dL — ABNORMAL LOW (ref 8–23)
CO2: 28 mmol/L (ref 22–32)
Calcium: 7.6 mg/dL — ABNORMAL LOW (ref 8.9–10.3)
Chloride: 101 mmol/L (ref 98–111)
Creatinine, Ser: 0.76 mg/dL (ref 0.44–1.00)
GFR, Estimated: >60 mL/min (ref >60)
Glucose, Bld: 93 mg/dL (ref 70–99)
Potassium: 3.3 mmol/L — ABNORMAL LOW (ref 3.5–5.1)
Sodium: 138 mmol/L (ref 135–145)

## 2021-01-11 LAB — MAGNESIUM: Magnesium: 1.9 mg/dL (ref 1.7–2.4)

## 2021-01-11 MED ORDER — POTASSIUM CHLORIDE 20 MEQ/15ML (10%) PO SOLN: 20.0000 meq | Freq: Two times a day (BID) | ORAL | 0 refills | Status: DC

## 2021-01-11 MED ORDER — POTASSIUM CHLORIDE 10 MEQ/100ML IV SOLN
10.0000 meq | INTRAVENOUS | Status: AC
Start: 2021-01-11 — End: 2021-01-11
  Administered 2021-01-11 (×4): 10 meq via INTRAVENOUS
  Filled 2021-01-11 (×4): qty 100

## 2021-01-11 MED ORDER — HEPARIN SOD (PORK) LOCK FLUSH 100 UNIT/ML IV SOLN
500.0000 [IU] | Freq: Once | INTRAVENOUS | Status: DC
  Filled 2021-01-11: qty 5

## 2021-01-11 MED ORDER — POTASSIUM CHLORIDE CRYS ER 20 MEQ PO TBCR: 40.0000 meq | EXTENDED_RELEASE_TABLET | Freq: Every day | ORAL | 0 refills | Status: DC

## 2021-01-11 MED ORDER — METOPROLOL SUCCINATE ER 25 MG PO TB24: 25.0000 mg | ORAL_TABLET | Freq: Every day | ORAL | 0 refills | Status: DC

## 2021-01-11 NOTE — Discharge Summary (Signed)
Physician Discharge Summary  Judith Ross VQM:[id number] DOB: April 11, 1955 DOA: 01/03/2021  PCP: Venia Carbon, MD  Admit date: 01/03/2021 Discharge date: 01/11/2021  Admitted From: home  Disposition:  home w/ home health   Recommendations for Outpatient Follow-up:  Follow up with PCP or onco in 1 week. Needs BMP to check potassium level F/u w/ cardio, Dr. Saunders Revel, in 1-2 weeks   Home Health: yes  Equipment/Devices:  Discharge Condition: stable  CODE STATUS: full  Diet recommendation: Heart Healthy   Brief/Interim Summary: HPI was taken from Dr. Dena Billet: Judith Ross is a 65 y.o. female with medical history significant for colon cancer (diagnosed 08/18/20) currently undergoing treatment (last chemo 12/22/20), pulmonary embolism July 2022 (first ever PE; now on Xarelto), hypothyroidism, hypertension, hyperlipidemia, who presents to the emergency department on 01/03/2021 with generalized weakness. She fell on the day of admission at home when she was trying to get out of bed; she felt dizzy before and after she fell. She did hit her head on the right side. Associated symptoms: Shortness of breath x 1 week without coughing or wheezing; shortness of breath is not worse with exertion. She does not have a headache. She has generalized weakness that is not worse in 1 arm or 1 leg. She reports she has chronic abdominal pain that is located all over the abdomen, does not radiate, is up to 8/10 at times and is characterized as cramping; this is her usual abdominal pain; it is alleviated by nothing and exacerbated by nothing. She has had diarrhea x 2 weeks. No nausea, vomiting, or bloody stool. No dysuria or hematuria. No chest pain or palpitations. She does not use a CPAP. She has no history of atrial fibrillation, CAD, or CHF.  Patient reported that she has low potassium normally but stopped taking the medication because the pills were too big to swallow.     ED Course: Labs included potassium 2.8,  WBCs 1.9, hemoglobin 12.5, platelets 128.  EKG showed new onset atrial fibrillation with rapid ventricular response.  Patient was given potassium oral and IV.  She was given IV diltiazem and rate improved, but then it worsened again.  She was going to be started on IV diltiazem drip, which was ordered, but then her heart rate improved again, so it was not actually started.  She had an episode of a short run of ventricular tachycardia that resolved without treatment.  She was given IV fluids for dehydration.  Head CT was done due to history of fall at home and hitting head; head CT showed no acute process but did show a small lesion thought to be a meningioma.  CTA of the chest showed no pulmonary embolism but did show a small right pleural effusion.   As per Dr. Louanne Belton: Judith Ross is a 65 y.o. female with past medical history of colon cancer undergoing chemotherapy, history of pulmonary embolism on Xarelto, hypothyroidism, hypertension, hyperlipidemia presented to the ED with generalized weakness, dizziness followed by a fall and did hit her head on the right side.  She had been feeling short of breath for a week without coughing or wheezing mostly short of breath on exertion.  Had been having some diarrhea for 2 weeks as outpatient.  In the ED, patient was noted to have hypokalemia.  She had stopped taking her potassium pills at home since the pills were big.  EKG showed new onset atrial fibrillation with rapid ventricular response.  She was given IV Cardizem and before  she was started on Cardizem drip her rhythm converted.  She did have pulm management and elevated lactate as well.  She also had episode of ventricular tachycardia which was nonsustained.  CT head scan did not show any acute findings but a small lesion thought to be meningioma.  CT of the chest showed no evidence of pulmonary embolism but small right pleural effusion.  Patient was then considered for admission to the hospital.  Hospital  course from Dr. Jimmye Norman 10/19-10/25/22: Pt was found to have persistent hypokalemia that was treated w/ potassium replacement. Pt was sent home w/ po potassium and to f/u outpatient w/ PCP or onco to get repeat labs to evaluate potassium level. Pt verbalized her understanding. Persistent hypokalemia likely came from diarrhea. C. Diff & GI PCR panel were both neg. Pt's diarrhea improved w/ prn imodium. PT/OT evaluated the pt and recommended HH. HH was set up by CM prior to d/c. For more information, please see previous progress notes.   Discharge Diagnoses:  Principal Problem:   Atrial fibrillation with rapid ventricular response (HCC) Active Problems:   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HCC)   Meningioma (HCC)   Current use of long term anticoagulation   History of pulmonary embolism   Hypokalemia   Diarrhea   Non-sustained ventricular tachycardia   Atrial fibrillation with RVR ([place])  Fall: at home. PT/OT recs HH. Fell on 10/23 when trying get up alone but AA&OX4 & did not hit her head. Bed alarm on. PT needs to see pt again today    Persistent hypokalemia: improving but still mildly low. Will give IV potassium while inpatient and d/c home w/ po potassium. Mg is WNL    Hypoglycemic episode: likely secondary to poor appetite. Encourage po intake. S/p D50 x1. Will continue to monitor    A. fib: w/ RVR. New onset. Continue on metoprolol, xarelto. Converted w/o cardizem drip. Needs outpatient f/u w/ cardio, Dr. Saunders Revel    Nonsustained ventricular tachycardia: 1 episode.  Likely secondary to electrolyte abnormalities.    Adenocarcinoma of transverse colon: chemo as an outpatient    Meningioma: noted on CT head. Will need outpatient f/u    Hx of pulmonary embolism: continue on xarelto    Diarrhea: etiology unclear. Likely etiology on severe hypokalemia. C. diff is neg. GI PCR panel is neg as well. Imodium prn    Generalized weakness:  PT/OT recs HH    Hypothyroidism: continue on home dose  of levothyroxine    Suprapubic tenderness:  likely secondary to UTI. Urine cx grew e. coli. Completed abx course  Discharge Instructions  Discharge Instructions     Diet - low sodium heart healthy   Complete by: As directed    Discharge instructions   Complete by: As directed    F/u w/ PCP or oncologist in 1 week. Will need to get BMP to check potassium level within 1 week. F/u w/ cardio, Dr. Saunders Revel, in 1-2 weeks   Increase activity slowly   Complete by: As directed       Allergies as of 01/11/2021       Reactions   Erythromycin Itching        Medication List     STOP taking these medications    dexamethasone 4 MG tablet Commonly known as: DECADRON   enoxaparin 100 MG/ML injection Commonly known as: LOVENOX   potassium chloride 20 MEQ/15ML (10%) Soln       TAKE these medications    atorvastatin 10 MG tablet Commonly known  as: LIPITOR TAKE 1 TABLET(10 MG) BY MOUTH DAILY   clobetasol ointment 0.05 % Commonly known as: TEMOVATE Apply to affected area every night for 4 weeks, then every other day for 4 weeks and then twice a week for 4 weeks or until resolution.   feeding supplement Liqd Take 237 mLs by mouth 3 (three) times daily between meals.   levothyroxine 150 MCG tablet Commonly known as: SYNTHROID Take 1 tablet (150 mcg total) by mouth daily.   metoprolol succinate 25 MG 24 hr tablet Commonly known as: TOPROL-XL Take 1 tablet (25 mg total) by mouth daily. Start taking on: January 12, 2021   oxyCODONE-acetaminophen 5-325 MG tablet Commonly known as: PERCOCET/ROXICET Take 1 tablet by mouth every 4 (four) hours as needed for severe pain.   pantoprazole 40 MG tablet Commonly known as: PROTONIX Take 1 tablet (40 mg total) by mouth 2 (two) times daily.   potassium chloride SA 20 MEQ tablet Commonly known as: KLOR-CON Take 2 tablets (40 mEq total) by mouth daily for 7 days. What changed:  how much to take when to take this   prochlorperazine 10  MG tablet Commonly known as: COMPAZINE Take 10 mg by mouth 2 (two) times daily as needed for nausea or vomiting.   Xarelto 20 MG Tabs tablet Generic drug: rivaroxaban TAKE 1 TABLET BY MOUTH DAILY WITH SUPPER. START ON FINAL FULL DAY OF LOVENOX INJECTIONS.               Durable Medical Equipment  (From admission, onward)           Start     Ordered   01/08/21 0745  For home use only DME 3 n 1  Once        01/08/21 0744            Allergies  Allergen Reactions   Erythromycin Itching    Consultations:    Procedures/Studies: CT HEAD WO CONTRAST (5MM)  Result Date: 01/03/2021 CLINICAL DATA:  Head trauma fall EXAM: CT HEAD WITHOUT CONTRAST TECHNIQUE: Contiguous axial images were obtained from the base of the skull through the vertex without intravenous contrast. COMPARISON:  None. FINDINGS: Brain: No acute territorial infarction, or hemorrhage. Possible small extra-axial mass at the left parietal vertex with calcification, sagittal series 5, image 32, measuring 8 mm, probably representing meningioma. The ventricles are non enlarged. Vascular: No hyperdense vessels.  No unexpected calcification Skull: Normal. Negative for fracture or focal lesion. Sinuses/Orbits: No acute finding. Other: None IMPRESSION: 1. No CT evidence for acute intracranial abnormality. 2. Suspicion of small extra-axial mass at the left parietal vertex, which may represent small meningioma Electronically Signed   By: Donavan Foil M.D.   On: 01/03/2021 17:49   CT Angio Chest PE W and/or Wo Contrast  Result Date: 01/03/2021 CLINICAL DATA:  Weakness AFib EXAM: CT ANGIOGRAPHY CHEST WITH CONTRAST TECHNIQUE: Multidetector CT imaging of the chest was performed using the standard protocol during bolus administration of intravenous contrast. Multiplanar CT image reconstructions and MIPs were obtained to evaluate the vascular anatomy. CONTRAST:  45mL OMNIPAQUE IOHEXOL 350 MG/ML SOLN COMPARISON:  CT 11/23/2020,  chest CT 09/18/2020 FINDINGS: Cardiovascular: Satisfactory opacification of the pulmonary arteries to the segmental level. Negative for acute filling defect within the main, lobar or segmental pulmonary vessels. Hypoenhancement right lower lobe subsegmental arteries, for example series 7, image 24. The vessels appear irregular and attenuated in caliber compared to the prior CTA and suspect that findings are likely related to chronic PE.  Negative for pericardial effusion. Borderline cardiomegaly. Mediastinum/Nodes: Midline trachea. No thyroid mass. Postsurgical changes of the left lobe. No suspicious adenopathy. Esophagus within normal limits Lungs/Pleura: Small right-sided pleural effusion. Mild ground-glass density in the right lung base, could be secondary to mosaic perfusion. Upper Abdomen: No acute abnormality. Mild stranding in left upper quadrant omentum without change. Musculoskeletal: Incompletely visualized nodularity within the subareolar left breast, without significant change Review of the MIP images confirms the above findings. IMPRESSION: 1. No definite evidence for acute main, lobar, or segmental pulmonary embolus. Poor enhancement of right lower lobe subsegmental vessels with overall attenuated caliber of vessels, suspect that findings are related to sequela of chronic PE given findings on prior angiography. 2. Small right-sided pleural effusion Electronically Signed   By: Donavan Foil M.D.   On: 01/03/2021 18:06   (Echo, Carotid, EGD, Colonoscopy, ERCP)    Subjective: pt c/o poor appetite   Discharge Exam: Vitals:   01/11/21 0750 01/11/21 1106  BP: 138/82 137/86  Pulse: 69 63  Resp: 17 18  Temp: 98.5 F (36.9 C) 98 F (36.7 C)  SpO2: 99% 99%   Vitals:   01/11/21 0012 01/11/21 0440 01/11/21 0750 01/11/21 1106  BP: 133/86 135/82 138/82 137/86  Pulse: 66 67 69 63  Resp: 16 16 17 18   Temp: 98.6 F (37 C) 98.4 F (36.9 C) 98.5 F (36.9 C) 98 F (36.7 C)  TempSrc: Oral Oral  Oral Oral  SpO2: 99% 99% 99% 99%  Weight:      Height:        General: Pt is alert, awake, not in acute distress Cardiovascular: S1/S2 +, no rubs, no gallops Respiratory: CTA bilaterally, no wheezing, no rhonchi Abdominal: Soft, NT, ND, bowel sounds + Extremities: no cyanosis    The results of significant diagnostics from this hospitalization (including imaging, microbiology, ancillary and laboratory) are listed below for reference.     Microbiology: Recent Results (from the past 240 hour(s))  CULTURE, BLOOD (ROUTINE X 2) w Reflex to ID Panel     Status: None   Collection Time: 01/03/21 10:15 PM   Specimen: BLOOD  Result Value Ref Range Status   Specimen Description BLOOD RIGHT FOREARM  Final   Special Requests   Final    BOTTLES DRAWN AEROBIC AND ANAEROBIC Blood Culture adequate volume   Culture   Final    NO GROWTH 7 DAYS Performed at [facility], [address], [place], [place] [address]    Report Status 01/10/2021 FINAL  Final  Urine Culture     Status: Abnormal   Collection Time: 01/03/21 10:20 PM   Specimen: Urine, Random  Result Value Ref Range Status   Specimen Description   Final    URINE, RANDOM Performed at [facility], [address], [place], [place] [address]    Special Requests   Final    Immunocompromised Performed at [facility], [place], [place] [address]    Culture 20,000 COLONIES/mL ESCHERICHIA COLI (A)  Final   Report Status 01/05/2021 FINAL  Final   Organism ID, Bacteria ESCHERICHIA COLI (A)  Final      Susceptibility   Escherichia coli - MIC*    AMPICILLIN 4 SENSITIVE Sensitive     CEFAZOLIN <=4 SENSITIVE Sensitive     CEFEPIME <=0.12 SENSITIVE Sensitive     CEFTRIAXONE <=0.25 SENSITIVE Sensitive     CIPROFLOXACIN <=0.25 SENSITIVE Sensitive     GENTAMICIN <=1 SENSITIVE Sensitive     IMIPENEM <=  0.25 SENSITIVE Sensitive     NITROFURANTOIN <=16 SENSITIVE Sensitive     TRIMETH/SULFA <=20  SENSITIVE Sensitive     AMPICILLIN/SULBACTAM <=2 SENSITIVE Sensitive     PIP/TAZO <=4 SENSITIVE Sensitive     * 20,000 COLONIES/mL ESCHERICHIA COLI  Stool culture (children & immunocomp patients)     Status: None   Collection Time: 01/04/21 12:29 PM   Specimen: Perirectal; Stool  Result Value Ref Range Status   Salmonella/Shigella Screen Final report  Final   Campylobacter Culture Final report  Final   E coli, Shiga toxin Assay Negative Negative Final    Comment: (NOTE) Performed At: [facility] [place], [place] [id number] Rush Farmer MD [id number]   C Difficile Quick Screen w PCR reflex     Status: None   Collection Time: 01/04/21 12:29 PM   Specimen: Perirectal; Stool  Result Value Ref Range Status   C Diff antigen NEGATIVE NEGATIVE Final   C Diff toxin NEGATIVE NEGATIVE Final   C Diff interpretation No C. difficile detected.  Final    Comment: Performed at [facility], [address] [place], [place] [address]  STOOL CULTURE REFLEX - RSASHR     Status: None   Collection Time: 01/04/21 12:29 PM  Result Value Ref Range Status   Stool Culture result 1 (RSASHR) Comment  Final    Comment: (NOTE) No Salmonella or Shigella recovered. Performed At: [facility] [place], [place] [id number] Rush Farmer MD [id number]   STOOL CULTURE Reflex - CMPCXR     Status: None   Collection Time: 01/04/21 12:29 PM  Result Value Ref Range Status   Stool Culture result 1 (CMPCXR) Comment  Final    Comment: (NOTE) No Campylobacter species isolated. Performed At: [facility] [place], [place] [id number] Rush Farmer MD [id number]   Gastrointestinal Panel by PCR , Stool     Status: None   Collection Time: 01/04/21 12:30 PM   Specimen: Stool  Result Value Ref Range Status   Campylobacter species NOT DETECTED NOT DETECTED Final   Plesimonas shigelloides NOT DETECTED NOT DETECTED Final   Salmonella  species NOT DETECTED NOT DETECTED Final   Yersinia enterocolitica NOT DETECTED NOT DETECTED Final   Vibrio species NOT DETECTED NOT DETECTED Final   Vibrio cholerae NOT DETECTED NOT DETECTED Final   Enteroaggregative E coli (EAEC) NOT DETECTED NOT DETECTED Final   Enteropathogenic E coli (EPEC) NOT DETECTED NOT DETECTED Final   Enterotoxigenic E coli (ETEC) NOT DETECTED NOT DETECTED Final   Shiga like toxin producing E coli (STEC) NOT DETECTED NOT DETECTED Final   Shigella/Enteroinvasive E coli (EIEC) NOT DETECTED NOT DETECTED Final   Cryptosporidium NOT DETECTED NOT DETECTED Final   Cyclospora cayetanensis NOT DETECTED NOT DETECTED Final   Entamoeba histolytica NOT DETECTED NOT DETECTED Final   Giardia lamblia NOT DETECTED NOT DETECTED Final   Adenovirus F40/41 NOT DETECTED NOT DETECTED Final   Astrovirus NOT DETECTED NOT DETECTED Final   Norovirus GI/GII NOT DETECTED NOT DETECTED Final   Rotavirus A NOT DETECTED NOT DETECTED Final   Sapovirus (I, II, IV, and V) NOT DETECTED NOT DETECTED Final    Comment: Performed at [facility], [address] [place], [place] [address]  Resp Panel by RT-PCR (Flu A&B, Covid) Nasopharyngeal Swab     Status: None   Collection Time: 01/04/21  4:45 PM   Specimen: Nasopharyngeal Swab; Nasopharyngeal(NP) swabs in vial transport medium  Result Value Ref  Range Status   SARS Coronavirus 2 by RT PCR NEGATIVE NEGATIVE Final    Comment: (NOTE) SARS-CoV-2 target nucleic acids are NOT DETECTED.  The SARS-CoV-2 RNA is generally detectable in upper respiratory specimens during the acute phase of infection. The lowest concentration of SARS-CoV-2 viral copies this assay can detect is 138 copies/mL. A negative result does not preclude SARS-Cov-2 infection and should not be used as the sole basis for treatment or other patient management decisions. A negative result may occur with  improper specimen collection/handling, submission of specimen other than  nasopharyngeal swab, presence of viral mutation(s) within the areas targeted by this assay, and inadequate number of viral copies(<138 copies/mL). A negative result must be combined with clinical observations, patient history, and epidemiological information. The expected result is Negative.  Fact Sheet for Patients:  EntrepreneurPulse.com.au  Fact Sheet for Healthcare Providers:  IncredibleEmployment.be  This test is no t yet approved or cleared by the [place] FDA and  has been authorized for detection and/or diagnosis of SARS-CoV-2 by FDA under an Emergency Use Authorization (EUA). This EUA will remain  in effect (meaning this test can be used) for the duration of the COVID-19 declaration under Section 564(b)(1) of the Act, 21 U.S.C.section 360bbb-3(b)(1), unless the authorization is terminated  or revoked sooner.       Influenza A by PCR NEGATIVE NEGATIVE Final   Influenza B by PCR NEGATIVE NEGATIVE Final    Comment: (NOTE) The Xpert Xpress SARS-CoV-2/FLU/RSV plus assay is intended as an aid in the diagnosis of influenza from Nasopharyngeal swab specimens and should not be used as a sole basis for treatment. Nasal washings and aspirates are unacceptable for Xpert Xpress SARS-CoV-2/FLU/RSV testing.  Fact Sheet for Patients: EntrepreneurPulse.com.au  Fact Sheet for Healthcare Providers: IncredibleEmployment.be  This test is not yet approved or cleared by the [place] FDA and has been authorized for detection and/or diagnosis of SARS-CoV-2 by FDA under an Emergency Use Authorization (EUA). This EUA will remain in effect (meaning this test can be used) for the duration of the COVID-19 declaration under Section 564(b)(1) of the Act, 21 U.S.C. section 360bbb-3(b)(1), unless the authorization is terminated or revoked.  Performed at [facility], [address] [place], [place]  [address]      Labs: BNP (last 3 results) Recent Labs    09/18/20 1819  BNP 601.0*   Basic Metabolic Panel: Recent Labs  Lab 01/07/21 0808 01/07/21 1847 01/08/21 0446 01/09/21 0505 01/10/21 0531 01/11/21 0515  NA 138  --  136 139 137 138  K 2.4* 2.7* 3.1* 3.0* 3.4* 3.3*  CL 100  --  100 104 96* 101  CO2 29  --  30 29 28 28   GLUCOSE 61*  --  68* 58* 62* 93  BUN <5*  --  <5* <5* 5* 6*  CREATININE 0.74  --  0.66 0.68 0.67 0.76  CALCIUM 7.5*  --  7.4* 7.3* 7.3* 7.6*  MG 2.0  --  1.8 2.1 2.0 1.9   Liver Function Tests: No results for input(s): AST, ALT, ALKPHOS, BILITOT, PROT, ALBUMIN in the last 168 hours. No results for input(s): LIPASE, AMYLASE in the last 168 hours. No results for input(s): AMMONIA in the last 168 hours. CBC: Recent Labs  Lab 01/07/21 0808 01/08/21 0446 01/09/21 0505 01/10/21 0531 01/11/21 0515  WBC 4.3 5.2 6.1 9.0 8.8  HGB 9.7* 10.0* 10.0* 11.2* 10.7*  HCT 28.8* 30.3* 29.3* 33.4* 32.6*  MCV 85.2 85.1 87.7 86.1 85.8  PLT 191 220 217 276 253   Cardiac Enzymes: No results for input(s): CKTOTAL, CKMB, CKMBINDEX, TROPONINI in the last 168 hours. BNP: Invalid input(s): POCBNP CBG: Recent Labs  Lab 01/10/21 0823 01/10/21 0938 01/10/21 1132 01/10/21 1740 01/10/21 2140  GLUCAP 58* 129* 74 71 101*   D-Dimer No results for input(s): DDIMER in the last 72 hours. Hgb A1c No results for input(s): HGBA1C in the last 72 hours. Lipid Profile No results for input(s): CHOL, HDL, LDLCALC, TRIG, CHOLHDL, LDLDIRECT in the last 72 hours. Thyroid function studies No results for input(s): TSH, T4TOTAL, T3FREE, THYROIDAB in the last 72 hours.  Invalid input(s): FREET3 Anemia work up No results for input(s): VITAMINB12, FOLATE, FERRITIN, TIBC, IRON, RETICCTPCT in the last 72 hours. Urinalysis    Component Value Date/Time   COLORURINE YELLOW (A) 01/03/2021 2220   APPEARANCEUR HAZY (A) 01/03/2021 2220   LABSPEC >1.046 (H) 01/03/2021 2220   PHURINE 6.0  01/03/2021 2220   GLUCOSEU NEGATIVE 01/03/2021 2220   HGBUR NEGATIVE 01/03/2021 2220   BILIRUBINUR SMALL (A) 01/03/2021 2220   BILIRUBINUR trace 07/14/2020 1141   KETONESUR 20 (A) 01/03/2021 2220   PROTEINUR 100 (A) 01/03/2021 2220   UROBILINOGEN 0.2 07/14/2020 1141   UROBILINOGEN 1.0 08/03/2011 0856   NITRITE NEGATIVE 01/03/2021 2220   LEUKOCYTESUR NEGATIVE 01/03/2021 2220   Sepsis Labs Invalid input(s): PROCALCITONIN,  WBC,  LACTICIDVEN Microbiology Recent Results (from the past 240 hour(s))  CULTURE, BLOOD (ROUTINE X 2) w Reflex to ID Panel     Status: None   Collection Time: 01/03/21 10:15 PM   Specimen: BLOOD  Result Value Ref Range Status   Specimen Description BLOOD RIGHT FOREARM  Final   Special Requests   Final    BOTTLES DRAWN AEROBIC AND ANAEROBIC Blood Culture adequate volume   Culture   Final    NO GROWTH 7 DAYS Performed at [facility], [address] [place], [place] [address]    Report Status 01/10/2021 FINAL  Final  Urine Culture     Status: Abnormal   Collection Time: 01/03/21 10:20 PM   Specimen: Urine, Random  Result Value Ref Range Status   Specimen Description   Final    URINE, RANDOM Performed at [facility], [address] [place], [place] [address]    Special Requests   Final    Immunocompromised Performed at [facility], [address] [place], [place] [address]    Culture 20,000 COLONIES/mL ESCHERICHIA COLI (A)  Final   Report Status 01/05/2021 FINAL  Final   Organism ID, Bacteria ESCHERICHIA COLI (A)  Final      Susceptibility   Escherichia coli - MIC*    AMPICILLIN 4 SENSITIVE Sensitive     CEFAZOLIN <=4 SENSITIVE Sensitive     CEFEPIME <=0.12 SENSITIVE Sensitive     CEFTRIAXONE <=0.25 SENSITIVE Sensitive     CIPROFLOXACIN <=0.25 SENSITIVE Sensitive     GENTAMICIN <=1 SENSITIVE Sensitive     IMIPENEM <=0.25 SENSITIVE Sensitive     NITROFURANTOIN <=16 SENSITIVE Sensitive     TRIMETH/SULFA <=20  SENSITIVE Sensitive     AMPICILLIN/SULBACTAM <=2 SENSITIVE Sensitive     PIP/TAZO <=4 SENSITIVE Sensitive     * 20,000 COLONIES/mL ESCHERICHIA COLI  Stool culture (children & immunocomp patients)     Status: None   Collection Time: 01/04/21 12:29 PM   Specimen: Perirectal; Stool  Result Value Ref Range Status   Salmonella/Shigella Screen Final report  Final   Campylobacter Culture Final report  Final   E coli, Shiga toxin Assay Negative Negative Final    Comment: (NOTE) Performed At: [facility] [place], [place] [id number] Rush Farmer MD [id number]   C Difficile Quick Screen w PCR reflex     Status: None   Collection Time: 01/04/21 12:29 PM   Specimen: Perirectal; Stool  Result Value Ref Range Status   C Diff antigen NEGATIVE NEGATIVE Final   C Diff toxin NEGATIVE NEGATIVE Final   C Diff interpretation No C. difficile detected.  Final    Comment: Performed at [facility], [address] [place], [place] [address]  STOOL CULTURE REFLEX - RSASHR     Status: None   Collection Time: 01/04/21 12:29 PM  Result Value Ref Range Status   Stool Culture result 1 (RSASHR) Comment  Final    Comment: (NOTE) No Salmonella or Shigella recovered. Performed At: [facility] [place], [place] [id number] Rush Farmer MD [id number]   STOOL CULTURE Reflex - CMPCXR     Status: None   Collection Time: 01/04/21 12:29 PM  Result Value Ref Range Status   Stool Culture result 1 (CMPCXR) Comment  Final    Comment: (NOTE) No Campylobacter species isolated. Performed At: [facility] [place], [place] [id number] Rush Farmer MD [id number]   Gastrointestinal Panel by PCR , Stool     Status: None   Collection Time: 01/04/21 12:30 PM   Specimen: Stool  Result Value Ref Range Status   Campylobacter species NOT DETECTED NOT DETECTED Final   Plesimonas shigelloides NOT DETECTED NOT DETECTED Final   Salmonella  species NOT DETECTED NOT DETECTED Final   Yersinia enterocolitica NOT DETECTED NOT DETECTED Final   Vibrio species NOT DETECTED NOT DETECTED Final   Vibrio cholerae NOT DETECTED NOT DETECTED Final   Enteroaggregative E coli (EAEC) NOT DETECTED NOT DETECTED Final   Enteropathogenic E coli (EPEC) NOT DETECTED NOT DETECTED Final   Enterotoxigenic E coli (ETEC) NOT DETECTED NOT DETECTED Final   Shiga like toxin producing E coli (STEC) NOT DETECTED NOT DETECTED Final   Shigella/Enteroinvasive E coli (EIEC) NOT DETECTED NOT DETECTED Final   Cryptosporidium NOT DETECTED NOT DETECTED Final   Cyclospora cayetanensis NOT DETECTED NOT DETECTED Final   Entamoeba histolytica NOT DETECTED NOT DETECTED Final   Giardia lamblia NOT DETECTED NOT DETECTED Final   Adenovirus F40/41 NOT DETECTED NOT DETECTED Final   Astrovirus NOT DETECTED NOT DETECTED Final   Norovirus GI/GII NOT DETECTED NOT DETECTED Final   Rotavirus A NOT DETECTED NOT DETECTED Final   Sapovirus (I, II, IV, and V) NOT DETECTED NOT DETECTED Final    Comment: Performed at [facility], [address] [place], [place] [address]  Resp Panel by RT-PCR (Flu A&B, Covid) Nasopharyngeal Swab     Status: None   Collection Time: 01/04/21  4:45 PM   Specimen: Nasopharyngeal Swab; Nasopharyngeal(NP) swabs in vial transport medium  Result Value Ref Range Status   SARS Coronavirus 2 by RT PCR NEGATIVE NEGATIVE Final    Comment: (NOTE) SARS-CoV-2 target nucleic acids are NOT DETECTED.  The SARS-CoV-2 RNA is generally detectable in upper respiratory specimens during the acute phase of infection. The lowest concentration of SARS-CoV-2 viral copies this assay can detect is 138 copies/mL. A negative result does not preclude SARS-Cov-2 infection and should not be used as the sole basis for treatment or other patient management decisions. A negative result may occur with  improper specimen collection/handling, submission  of specimen other than  nasopharyngeal swab, presence of viral mutation(s) within the areas targeted by this assay, and inadequate number of viral copies(<138 copies/mL). A negative result must be combined with clinical observations, patient history, and epidemiological information. The expected result is Negative.  Fact Sheet for Patients:  EntrepreneurPulse.com.au  Fact Sheet for Healthcare Providers:  IncredibleEmployment.be  This test is no t yet approved or cleared by the [place] FDA and  has been authorized for detection and/or diagnosis of SARS-CoV-2 by FDA under an Emergency Use Authorization (EUA). This EUA will remain  in effect (meaning this test can be used) for the duration of the COVID-19 declaration under Section 564(b)(1) of the Act, 21 U.S.C.section 360bbb-3(b)(1), unless the authorization is terminated  or revoked sooner.       Influenza A by PCR NEGATIVE NEGATIVE Final   Influenza B by PCR NEGATIVE NEGATIVE Final    Comment: (NOTE) The Xpert Xpress SARS-CoV-2/FLU/RSV plus assay is intended as an aid in the diagnosis of influenza from Nasopharyngeal swab specimens and should not be used as a sole basis for treatment. Nasal washings and aspirates are unacceptable for Xpert Xpress SARS-CoV-2/FLU/RSV testing.  Fact Sheet for Patients: EntrepreneurPulse.com.au  Fact Sheet for Healthcare Providers: IncredibleEmployment.be  This test is not yet approved or cleared by the [place] FDA and has been authorized for detection and/or diagnosis of SARS-CoV-2 by FDA under an Emergency Use Authorization (EUA). This EUA will remain in effect (meaning this test can be used) for the duration of the COVID-19 declaration under Section 564(b)(1) of the Act, 21 U.S.C. section 360bbb-3(b)(1), unless the authorization is terminated or revoked.  Performed at [facility], [address] [place], [place]  [address]      Time coordinating discharge: Over 30 minutes  SIGNED:   Wyvonnia Dusky, MD  [facility] 01/11/2021, 11:31 AM Pager   If 7PM-7AM, please contact night-coverage

## 2021-01-11 NOTE — TOC Transition Note (Signed)
Transition of Care [facility]) - CM/SW Discharge Note   Patient Details  Name: Judith Ross MRN: [id number] Date of Birth: April 16, 1955  Transition of Care [facility]) CM/SW Contact:  Alberteen Sam, LCSW Phone Number: 01/11/2021, 11:45 AM   Clinical Narrative:     Patient will dc today, is set up with [facility] for PT OT and RN, no DME needs.   No other discharge needs identified at this time. Corene Cornea with [facility] informed of patient's dc today.   Final next level of care: [place] Barriers to Discharge: No Barriers Identified   Patient Goals and CMS Choice Patient states their goals for this hospitalization and ongoing recovery are:: to go home CMS Medicare.gov Compare Post Acute Care list provided to:: Patient Choice offered to / list presented to : Patient  Discharge Placement                    Patient and family notified of of transfer: 01/11/21  Discharge Plan and Services     Post Acute Care Choice: Home Health                    HH Arranged: PT, OT, RN [facility] Agency: [place] ([place]) Date [facility] Agency Contacted: 01/11/21 Time [place]: [id number] Representative spoke with at [place]: [place] ([place]) Interventions     Readmission Risk Interventions No flowsheet data found.

## 2021-01-12 ENCOUNTER — Telehealth: Payer: Self-pay

## 2021-01-12 NOTE — Telephone Encounter (Signed)
Transition Care Management Unsuccessful Follow-up Telephone Call  Date of discharge and from where:  01/11/21 from [facility]  Attempts:  1st Attempt  Reason for unsuccessful TCM follow-up call:  Unable to leave message , No answer

## 2021-01-13 ENCOUNTER — Telehealth: Payer: Self-pay

## 2021-01-13 NOTE — Telephone Encounter (Signed)
Transition Care Management Unsuccessful Follow-up Telephone Call  Date of discharge and from where:  01/11/21 from [facility]  Attempts:  3rd Attempt  Reason for unsuccessful TCM follow-up call:  Unable to leave message on home number. TM

## 2021-01-13 NOTE — Telephone Encounter (Signed)
Transition Care Management Unsuccessful Follow-up Telephone Call  Date of discharge and from where:  01/11/21 from Judith Ross  Attempts:  2nd Attempt  Reason for unsuccessful TCM follow-up call:  Left voice message , on patient mobile number. Unable to leave a message on home number.  TM

## 2021-01-13 NOTE — Telephone Encounter (Signed)
okay

## 2021-01-14 ENCOUNTER — Ambulatory Visit: Payer: Medicare Other | Admitting: Cardiology

## 2021-01-17 NOTE — Telephone Encounter (Signed)
No ans no vm.   Appt was scheduled on 10/25 no show

## 2021-01-18 ENCOUNTER — Encounter: Payer: Self-pay | Admitting: Family Medicine

## 2021-01-18 ENCOUNTER — Other Ambulatory Visit: Payer: Self-pay

## 2021-01-18 ENCOUNTER — Ambulatory Visit (INDEPENDENT_AMBULATORY_CARE_PROVIDER_SITE_OTHER): Payer: Medicare Other | Admitting: Family Medicine

## 2021-01-18 VITALS — BP 126/70 | HR 91 | Temp 97.1°F | Ht 66.0 in

## 2021-01-18 DIAGNOSIS — R531 Weakness: Secondary | ICD-10-CM | POA: Insufficient documentation

## 2021-01-18 DIAGNOSIS — I10 Essential (primary) hypertension: Secondary | ICD-10-CM

## 2021-01-18 DIAGNOSIS — E890 Postprocedural hypothyroidism: Secondary | ICD-10-CM

## 2021-01-18 DIAGNOSIS — I4891 Unspecified atrial fibrillation: Secondary | ICD-10-CM

## 2021-01-18 DIAGNOSIS — D329 Benign neoplasm of meninges, unspecified: Secondary | ICD-10-CM

## 2021-01-18 DIAGNOSIS — R197 Diarrhea, unspecified: Secondary | ICD-10-CM

## 2021-01-18 DIAGNOSIS — B370 Candidal stomatitis: Secondary | ICD-10-CM | POA: Insufficient documentation

## 2021-01-18 DIAGNOSIS — E876 Hypokalemia: Secondary | ICD-10-CM

## 2021-01-18 DIAGNOSIS — C184 Malignant neoplasm of transverse colon: Secondary | ICD-10-CM

## 2021-01-18 LAB — BASIC METABOLIC PANEL
BUN: 8 mg/dL (ref 6–23)
CO2: 29 mEq/L (ref 19–32)
Calcium: 8.1 mg/dL — ABNORMAL LOW (ref 8.4–10.5)
Chloride: 98 mEq/L (ref 96–112)
Creatinine, Ser: 0.75 mg/dL (ref 0.40–1.20)
GFR: 83.2 mL/min (ref >60.00)
Glucose, Bld: 73 mg/dL (ref 70–99)
Potassium: 4.6 mEq/L (ref 3.5–5.1)
Sodium: 142 mEq/L (ref 135–145)

## 2021-01-18 LAB — TSH: TSH: 1.13 u[IU]/mL (ref 0.35–5.50)

## 2021-01-18 MED ORDER — NYSTATIN 100000 UNIT/ML MT SUSP: OROMUCOSAL | 0 refills | Status: DC

## 2021-01-18 NOTE — Assessment & Plan Note (Addendum)
New a fib in hospital with RVR Reviewed hospital records, lab results and studies in detail   Now in nl rhythm Tolerating metoprolol and xarelto Missed her cardiology appt yesterday and family will call to re schedule that (her cousin is with her today) She denies palpitations but is sometimes sob on exertion

## 2021-01-18 NOTE — Patient Instructions (Addendum)
Call the cardiology office to set up an appt since you missed yours   Try the nystatin for thrush in your mouth Swish and swallow three times daily   Continue current medicines   Sip fluids - constantly and slowly   Home heath should be coming out , make sure to answer the phone so you know when   Follow up with oncology tomorrow as planned

## 2021-01-18 NOTE — Assessment & Plan Note (Signed)
TSH was recently inc  She has not missed doses of levothyroxine 150 mcg daily  Labs done today

## 2021-01-18 NOTE — Assessment & Plan Note (Signed)
With recent fall after episode of n/v/d No focal weakness Also in tx for colon cancer   Home health for PT was ordered in the hospital- pt thinks she got a call  emph need to answer phone regarding upcoming appts and home care  This should help weakness Enc good fluid intake and inc food intake as tolerated

## 2021-01-18 NOTE — Assessment & Plan Note (Signed)
Recent hospitalization for this with n/v/d  Now improved  Re check K today  Had IV K in the hospital  None oral per pt  Denies cramping but does still feel weak Poor food intake as well

## 2021-01-18 NOTE — Progress Notes (Signed)
Subjective:    Patient ID: Judith Ross, female    DOB: Mar 11, 1956, 65 y.o.   MRN: [id number]  This visit occurred during the SARS-CoV-2 public health emergency.  Safety protocols were in place, including screening questions prior to the visit, additional usage of staff PPE, and extensive cleaning of exam room while observing appropriate contact time as indicated for disinfecting solutions.   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 PT/OT recs HH. Fell on 10/23 when trying get up alone but AA&OX4 & did not hit her head. Bed alarm on. PT needs to see pt again today   They have not come out    Persistent hypokalemia: improving but still mildly low. Will give IV potassium while inpatient and d/c home w/ po potassium. Mg is WNL    Lab Results  Component Value Date   CREATININE 0.76 01/11/2021   BUN 6 (L) 01/11/2021   NA 138 01/11/2021   K 3.3 (L) 01/11/2021   CL 101 01/11/2021   CO2 28 01/11/2021   Has had some vomiting - 2 days  Is slowing down however  Trying to keep fluid down Taking generic compazine  Oncology f/u is tomorrow   Not having any cramping   Not getting any food in yet, tried ensure and soft foods  This was not previously from the cancer treatment  Occ abd pain  in the middle of abdomen  No more diarrhea    Hypoglycemic episode: likely secondary to poor appetite. Encourage po intake. S/p D50 x1. Will continue to monitor    A. fib: w/ RVR. New onset. Continue on metoprolol, xarelto. Converted w/o cardizem drip. Needs outpatient f/u w/ cardio, Dr. Saunders Revel    She missed an appt with her cardiologist    Nonsustained ventricular tachycardia: 1 episode.  Likely secondary to electrolyte abnormalities.    Adenocarcinoma of transverse colon: chemo as an outpatient   Lab Results  Component Value Date   WBC 8.8 01/11/2021   HGB 10.7 (L) 01/11/2021   HCT 32.6 (L) 01/11/2021   MCV 85.8 01/11/2021   PLT 253 01/11/2021   She wants a mouthwash she gets from oncology  For a bad taste in her mouth  Has a coating on tongue  Was given in the hospital     Meningioma: noted on CT head. Will need outpatient f/u   Per CT: IMPRESSION: 1. No CT evidence for acute intracranial abnormality. 2. Suspicion of small extra-axial mass at the left parietal vertex, which may represent small meningioma    Hx of pulmonary embolism: continue on xarelto   Per CTA chest MPRESSION: 1. No definite evidence for acute main, lobar, or segmental pulmonary embolus. Poor enhancement of right lower lobe  subsegmental vessels with overall attenuated caliber of vessels, suspect that findings are related to sequela of chronic PE given findings on prior angiography. 2. Small right-sided pleural effusion   Diarrhea: etiology unclear. Likely etiology on severe hypokalemia. C. diff is neg. GI PCR panel is neg as well. Imodium prn    Generalized weakness:  PT/OT recs HH    Hypothyroidism: continue on home dose of levothyroxine  Lab Results  Component Value Date   TSH 7.858 (H) 01/04/2021   Still taking levothyroxine    Suprapubic tenderness:  likely secondary to UTI. Urine cx grew e. coli. Completed abx course  Needs labs sent to Dr Grayland Ormond   BP Readings from Last 3 Encounters:   01/18/21 126/70  01/11/21 134/86  12/24/20 115/78   Pulse Readings from Last 3 Encounters:  01/18/21 91  01/11/21 67  12/24/20 66   Patient Active Problem List   Diagnosis Date Noted   Thrush 01/18/2021   Generalized weakness 01/18/2021   Atrial fibrillation with RVR ([place]) 01/04/2021   Atrial fibrillation with rapid ventricular response ([place]) 01/03/2021   Meningioma ([place]) 01/03/2021   Current use of long term anticoagulation 01/03/2021   History of pulmonary embolism 01/03/2021   Hypokalemia 01/03/2021   Diarrhea 01/03/2021   Non-sustained ventricular tachycardia 01/03/2021   Acute deep vein thrombosis (DVT) of right peroneal vein ([place]) 09/28/2020   Chemotherapy induced neutropenia ([place]) 09/28/2020   Acute pulmonary embolism ([place]) 09/18/2020   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[place]) 08/18/2020   Abdominal pain 07/14/2020   Prediabetes 10/08/2014   Essential hypertension 05/11/2014   HLD (hyperlipidemia) 05/11/2014   Hypothyroidism 05/11/2014   Arthritis 05/11/2014   Obstructive sleep apnea 05/11/2014   Past Medical History:  Diagnosis Date   Arthritis    Chicken pox    Helicobacter pylori gastritis    Hyperlipidemia    Hypertension    Metastatic colon cancer in female [facility])    Obesity    Sleep apnea    Thyroid disease    Past Surgical History:  Procedure Laterality Date   ABDOMINAL HYSTERECTOMY  12/2009   total   COLONOSCOPY WITH PROPOFOL N/A 08/13/2020   Procedure: COLONOSCOPY WITH PROPOFOL;  Surgeon: Jonathon Bellows, MD;  Location: [facility] ENDOSCOPY;  Service: Gastroenterology;  Laterality: N/A;   ESOPHAGOGASTRODUODENOSCOPY (EGD) WITH PROPOFOL N/A 08/13/2020   Procedure: ESOPHAGOGASTRODUODENOSCOPY (EGD) WITH PROPOFOL;  Surgeon: Jonathon Bellows, MD;  Location: [facility] ENDOSCOPY;  Service: Gastroenterology;  Laterality: N/A;   IR IMAGING GUIDED PORT INSERTION  08/27/2020   supracervical abdominal hysterectomy and bilateral salpingo--oophorectomy 01-17-2010 for fibroids   01/17/2010   TOTAL THYROIDECTOMY  1991-92   TUBAL LIGATION     Social History   Tobacco Use   Smoking status: Never   Smokeless tobacco: Never  Vaping Use   Vaping Use: Never used  Substance Use Topics   Alcohol use: No    Alcohol/week: 0.0 standard drinks   Drug use: No   Family History  Problem Relation Age of Onset   Stroke Father    Diabetes Maternal Grandmother    Hypertension Maternal Grandmother    Diabetes Maternal Uncle    Cancer Maternal Aunt        Breast   Allergies  Allergen Reactions   Erythromycin Itching   Current Outpatient Medications on File Prior to Visit  Medication Sig Dispense Refill   atorvastatin (LIPITOR) 10 MG tablet TAKE 1 TABLET(10 MG) BY MOUTH DAILY 90 tablet 0   clobetasol ointment (TEMOVATE) 0.05 % Apply to affected  area every night for 4 weeks, then every other day for 4 weeks and then twice a week for 4 weeks or until resolution. 30 g 5   feeding supplement (ENSURE ENLIVE / ENSURE PLUS) LIQD Take 237 mLs by mouth 3 (three) times daily between meals. 237 mL 12   levothyroxine (SYNTHROID) 150 MCG tablet Take 1 tablet (150 mcg total) by mouth daily. 90 tablet 3   metoprolol succinate (TOPROL-XL) 25 MG 24 hr tablet Take 1 tablet (25 mg total) by mouth daily. 30 tablet 0   oxyCODONE-acetaminophen (PERCOCET/ROXICET) 5-325 MG tablet Take 1 tablet by mouth every 4 (four) hours as needed for severe pain. 30 tablet 0   potassium chloride 20 MEQ/15ML (10%) SOLN Take 15 mLs (20 mEq total) by mouth 2 (two) times daily for 7 days. 210 mL 0   prochlorperazine (COMPAZINE) 10 MG tablet Take 10 mg by mouth 2 (two) times daily as needed for nausea or vomiting.     XARELTO 20 MG TABS tablet TAKE 1 TABLET BY MOUTH DAILY WITH SUPPER. START ON FINAL FULL DAY OF LOVENOX INJECTIONS. 30 tablet 3   pantoprazole (PROTONIX) 40 MG tablet Take 1 tablet (40 mg total) by mouth 2 (two) times daily. (Patient not taking: Reported on 11/16/2020) 60 tablet 0   No current  facility-administered medications on file prior to visit.    Review of Systems  Constitutional:  Positive for fatigue. Negative for activity change, appetite change, fever and unexpected weight change.  HENT:  Negative for congestion, ear pain, rhinorrhea, sinus pressure and sore throat.   Eyes:  Negative for pain, redness and visual disturbance.  Respiratory:  Negative for cough, shortness of breath and wheezing.   Cardiovascular:  Negative for chest pain and palpitations.  Gastrointestinal:  Positive for nausea and vomiting. Negative for abdominal distention, abdominal pain, anal bleeding, blood in stool, constipation, diarrhea and rectal pain.  Endocrine: Negative for polydipsia and polyuria.  Genitourinary:  Negative for dysuria, frequency and urgency.  Musculoskeletal:  Negative for arthralgias, back pain and myalgias.  Skin:  Negative for pallor and rash.  Allergic/Immunologic: Negative for environmental allergies.  Neurological:  Negative for dizziness, syncope and headaches.  Hematological:  Negative for adenopathy. Does not bruise/bleed easily.  Psychiatric/Behavioral:  Negative for decreased concentration and dysphoric mood. The patient is not nervous/anxious.       Objective:   Physical Exam Constitutional:      General: She is not in acute distress.    Appearance: Normal appearance. She is well-developed and normal weight. She is not ill-appearing or diaphoretic.     Comments: Exam done in wheelchair   HENT:     Head: Normocephalic and atraumatic.  Eyes:     Conjunctiva/sclera: Conjunctivae normal.     Pupils: Pupils are equal, round, and reactive to light.  Neck:     Thyroid: No thyromegaly.     Vascular: No carotid bruit or JVD.  Cardiovascular:     Rate and Rhythm: Regular rhythm. Tachycardia present.     Heart sounds: Normal heart sounds.    No gallop.     Comments: Rate of 91 Pulmonary:     Effort: Pulmonary effort is normal. No respiratory distress.      Breath sounds: Normal breath sounds. No wheezing or rales.  Abdominal:     General: Bowel sounds are normal. There is no distension or abdominal bruit.     Palpations: Abdomen is soft. There is no mass.     Tenderness: There  is no abdominal tenderness.  Musculoskeletal:     Cervical back: Normal range of motion and neck supple.     Right lower leg: No edema.     Left lower leg: No edema.  Lymphadenopathy:     Cervical: No cervical adenopathy.  Skin:    General: Skin is warm and dry.     Coloration: Skin is not pale.     Findings: No rash.     Comments: Nl skin color and turgor  Neurological:     Mental Status: She is alert.     Coordination: Coordination normal.     Deep Tendon Reflexes: Reflexes are normal and symmetric. Reflexes normal.  Psychiatric:        Mood and Affect: Mood normal. Affect is blunt.     Comments: Pt talks little  Will answer questions           Assessment & Plan:   Problem List Items Addressed This Visit       Cardiovascular and Mediastinum   Essential hypertension    bp is stable since hospitalization  Continues metoprolol xl 25mg  daily  Also new afib in hospital      Atrial fibrillation with rapid ventricular response ([place]) - Primary    New a fib in hospital with RVR Reviewed hospital records, lab results and studies in detail   Now in nl rhythm Tolerating metoprolol and xarelto Missed her cardiology appt yesterday and family will call to re schedule that (her cousin is with her today) She denies palpitations but is sometimes sob on exertion         Digestive   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[place])    Pt thinks she has f/u planned with oncologist tomorrow Plans to discuss ongoing nausea        Thrush    Small area of white coating on R side of tongue  Unable to scrape off  C/o bad taste in mouth Nystatin oral solution px for swish and swallow tid  Update if not starting to improve in a week or if worsening   Hopefully tx  this will make it easier to eat       Relevant Medications   nystatin (MYCOSTATIN) 100000 UNIT/ML suspension     Endocrine   Hypothyroidism    TSH was recently inc  She has not missed doses of levothyroxine 150 mcg daily  Labs done today      Relevant Orders   TSH     Nervous and Auditory   Meningioma ([place])    Incidental finding on CT scan (left parietal vertex) Small and no symptoms   May consider MRI in future- she plans to discuss with her pcp        Other   Hypokalemia    Recent hospitalization for this with n/v/d  Now improved  Re check K today  Had IV K in the hospital  None oral per pt  Denies cramping but does still feel weak Poor food intake as well      Relevant Orders   Basic metabolic panel   Diarrhea    This is better  Reviewed hospital records, lab results and studies in detail  uti was also treated  Now some nausea/has compazine Per pt oncology f/u is tomorrow      Generalized weakness    With recent fall after episode of n/v/d No focal weakness Also in tx for colon cancer   Home health for PT was ordered in the  hospital- pt thinks she got a call  emph need to answer phone regarding upcoming appts and home care  This should help weakness Enc good fluid intake and inc food intake as tolerated

## 2021-01-18 NOTE — Assessment & Plan Note (Signed)
Small area of white coating on R side of tongue  Unable to scrape off  C/o bad taste in mouth Nystatin oral solution px for swish and swallow tid  Update if not starting to improve in a week or if worsening   Hopefully tx this will make it easier to eat

## 2021-01-18 NOTE — Assessment & Plan Note (Signed)
Pt thinks she has f/u planned with oncologist tomorrow Plans to discuss ongoing nausea

## 2021-01-18 NOTE — Assessment & Plan Note (Signed)
bp is stable since hospitalization  Continues metoprolol xl 25mg  daily  Also new afib in hospital

## 2021-01-18 NOTE — Assessment & Plan Note (Signed)
This is better  Reviewed hospital records, lab results and studies in detail  uti was also treated  Now some nausea/has compazine Per pt oncology f/u is tomorrow

## 2021-01-18 NOTE — Assessment & Plan Note (Signed)
Incidental finding on CT scan (left parietal vertex) Small and no symptoms   May consider MRI in future- she plans to discuss with her pcp

## 2021-01-19 ENCOUNTER — Inpatient Hospital Stay: Payer: Medicare Other | Attending: Oncology

## 2021-01-19 ENCOUNTER — Inpatient Hospital Stay (HOSPITAL_BASED_OUTPATIENT_CLINIC_OR_DEPARTMENT_OTHER): Payer: Medicare Other | Admitting: Oncology

## 2021-01-19 VITALS — BP 121/80 | HR 77 | Temp 96.2°F | Resp 18 | Wt 184.3 lb

## 2021-01-19 DIAGNOSIS — C786 Secondary malignant neoplasm of retroperitoneum and peritoneum: Secondary | ICD-10-CM | POA: Diagnosis not present

## 2021-01-19 DIAGNOSIS — R110 Nausea: Secondary | ICD-10-CM | POA: Diagnosis not present

## 2021-01-19 DIAGNOSIS — R5383 Other fatigue: Secondary | ICD-10-CM | POA: Insufficient documentation

## 2021-01-19 DIAGNOSIS — I4891 Unspecified atrial fibrillation: Secondary | ICD-10-CM | POA: Diagnosis not present

## 2021-01-19 DIAGNOSIS — R531 Weakness: Secondary | ICD-10-CM | POA: Insufficient documentation

## 2021-01-19 DIAGNOSIS — Z79899 Other long term (current) drug therapy: Secondary | ICD-10-CM | POA: Insufficient documentation

## 2021-01-19 DIAGNOSIS — C184 Malignant neoplasm of transverse colon: Secondary | ICD-10-CM | POA: Diagnosis not present

## 2021-01-19 LAB — CBC WITH DIFFERENTIAL/PLATELET
Abs Immature Granulocytes: 0.78 10*3/uL — ABNORMAL HIGH (ref 0.00–0.07)
Basophils Absolute: 0.2 10*3/uL — ABNORMAL HIGH (ref 0.0–0.1)
Basophils Relative: 3 %
Eosinophils Absolute: 0 10*3/uL (ref 0.0–0.5)
Eosinophils Relative: 0 %
HCT: 39.9 % (ref 36.0–46.0)
Hemoglobin: 13.1 g/dL (ref 12.0–15.0)
Immature Granulocytes: 9 %
Lymphocytes Relative: 40 %
Lymphs Abs: 3.4 10*3/uL (ref 0.7–4.0)
MCH: 28.4 pg (ref 26.0–34.0)
MCHC: 32.8 g/dL (ref 30.0–36.0)
MCV: 86.4 fL (ref 80.0–100.0)
Monocytes Absolute: 0.9 10*3/uL (ref 0.1–1.0)
Monocytes Relative: 11 %
Neutro Abs: 3.1 10*3/uL (ref 1.7–7.7)
Neutrophils Relative %: 37 %
Platelets: 179 10*3/uL (ref 150–400)
RBC: 4.62 MIL/uL (ref 3.87–5.11)
RDW: 21.7 % — ABNORMAL HIGH (ref 11.5–15.5)
Smear Review: NORMAL
WBC: 8.5 10*3/uL (ref 4.0–10.5)
nRBC: 2.2 % — ABNORMAL HIGH (ref 0.0–0.2)

## 2021-01-19 LAB — COMPREHENSIVE METABOLIC PANEL
ALT: 14 U/L (ref 0–44)
AST: 26 U/L (ref 15–41)
Albumin: 3.4 g/dL — ABNORMAL LOW (ref 3.5–5.0)
Alkaline Phosphatase: 110 U/L (ref 38–126)
Anion gap: 15 (ref 5–15)
BUN: 9 mg/dL (ref 8–23)
CO2: 28 mmol/L (ref 22–32)
Calcium: 8.2 mg/dL — ABNORMAL LOW (ref 8.9–10.3)
Chloride: 95 mmol/L — ABNORMAL LOW (ref 98–111)
Creatinine, Ser: 0.76 mg/dL (ref 0.44–1.00)
GFR, Estimated: >60 mL/min (ref >60)
Glucose, Bld: 68 mg/dL — ABNORMAL LOW (ref 70–99)
Potassium: 3.7 mmol/L (ref 3.5–5.1)
Sodium: 138 mmol/L (ref 135–145)
Total Bilirubin: 1.3 mg/dL — ABNORMAL HIGH (ref 0.3–1.2)
Total Protein: 7.4 g/dL (ref 6.5–8.1)

## 2021-01-19 NOTE — Progress Notes (Signed)
Pt states she still feels weak. Pt reports poor appetite. "I just don't want to eat." Pt says she has boost/ensure at home but she doesn't drink them.

## 2021-01-20 ENCOUNTER — Encounter: Payer: Self-pay | Admitting: *Deleted

## 2021-01-20 ENCOUNTER — Encounter: Payer: Self-pay | Admitting: Oncology

## 2021-01-20 NOTE — Progress Notes (Signed)
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  INTERVAL HISTORY: Patient returns to clinic today for further evaluation, hospital follow-up, and discussion of when to reinitiate treatment.  She continues to have a poor appetite as well as increased weakness and fatigue.  She was recently discharged from the hospital after being admitted for atrial fibrillation with rapid ventricular response.  She reports follow-up with cardiology later this week.  She has no neurologic complaints.  She denies any fevers.  She denies any chest pain, shortness of breath, cough, hemoptysis.  She denies any nausea, vomiting, constipation, or diarrhea.  She has no melena or hematochezia.  She has no urinary complaints.  She has no further leg pain.  Patient offers no further specific complaints today.  REVIEW OF SYSTEMS:   Review of Systems  Constitutional:  Positive for malaise/fatigue and weight loss. Negative for fever.  Respiratory: Negative.  Negative for cough, hemoptysis and shortness of breath.   Cardiovascular: Negative.  Negative for chest pain and leg swelling.  Gastrointestinal: Negative.  Negative for abdominal pain, blood in stool, constipation, diarrhea, melena, nausea and vomiting.  Genitourinary: Negative.  Negative for dysuria.  Musculoskeletal: Negative.  Negative for back pain.  Skin: Negative.  Negative for rash.  Neurological:  Positive for weakness. Negative for dizziness, focal weakness and headaches.  Psychiatric/Behavioral: Negative.  The patient is not nervous/anxious.    As per HPI. Otherwise, a  complete review of systems is negative.  PAST MEDICAL HISTORY: Past Medical History:  Diagnosis Date   Arthritis    Chicken pox    Helicobacter pylori gastritis    Hyperlipidemia    Hypertension    Metastatic colon cancer in female [facility]    Obesity    Sleep apnea    Thyroid disease     PAST SURGICAL HISTORY: Past Surgical History:  Procedure Laterality Date   ABDOMINAL HYSTERECTOMY  12/2009   total   COLONOSCOPY WITH PROPOFOL N/A 08/13/2020   Procedure: COLONOSCOPY WITH PROPOFOL;  Surgeon: Jonathon Bellows, MD;  Location: [facility] ENDOSCOPY;  Service: Gastroenterology;  Laterality: N/A;   ESOPHAGOGASTRODUODENOSCOPY (EGD) WITH PROPOFOL N/A 08/13/2020   Procedure: ESOPHAGOGASTRODUODENOSCOPY (EGD) WITH PROPOFOL;  Surgeon: Jonathon Bellows, MD;  Location: [facility] ENDOSCOPY;  Service: Gastroenterology;  Laterality: N/A;   IR IMAGING GUIDED PORT INSERTION  08/27/2020   supracervical abdominal hysterectomy and bilateral salpingo--oophorectomy 01-17-2010 for fibroids  01/17/2010   TOTAL THYROIDECTOMY  1991-92   TUBAL LIGATION      FAMILY HISTORY: Family History  Problem Relation Age of Onset   Stroke Father    Diabetes Maternal Grandmother    Hypertension Maternal Grandmother    Diabetes Maternal Uncle    Cancer Maternal Aunt        Breast    ADVANCED DIRECTIVES (Y/N):  N  HEALTH MAINTENANCE: Social History   Tobacco Use   Smoking status: Never   Smokeless tobacco: Never  Vaping Use   Vaping Use: Never used  Substance Use Topics   Alcohol use: No    Alcohol/week: 0.0 standard drinks   Drug use: No     Colonoscopy:  PAP:  Bone density:  Lipid panel:  Allergies  Allergen  Reactions   Erythromycin Itching    Current Outpatient Medications  Medication Sig Dispense Refill   atorvastatin (LIPITOR) 10 MG tablet TAKE 1 TABLET(10 MG) BY MOUTH DAILY 90 tablet 0   clobetasol ointment (TEMOVATE) 0.05 % Apply to affected area every night for 4 weeks, then every other day for 4 weeks and  then twice a week for 4 weeks or until resolution. 30 g 5   feeding supplement (ENSURE ENLIVE / ENSURE PLUS) LIQD Take 237 mLs by mouth 3 (three) times daily between meals. 237 mL 12   levothyroxine (SYNTHROID) 150 MCG tablet Take 1 tablet (150 mcg total) by mouth daily. 90 tablet 3   metoprolol succinate (TOPROL-XL) 25 MG 24 hr tablet Take 1 tablet (25 mg total) by mouth daily. 30 tablet 0   nystatin (MYCOSTATIN) 100000 UNIT/ML suspension Take 1 teaspoon and swish in mouth then swallow three times daily 120 mL 0   oxyCODONE-acetaminophen (PERCOCET/ROXICET) 5-325 MG tablet Take 1 tablet by mouth every 4 (four) hours as needed for severe pain. 30 tablet 0   prochlorperazine (COMPAZINE) 10 MG tablet Take 10 mg by mouth 2 (two) times daily as needed for nausea or vomiting.     XARELTO 20 MG TABS tablet TAKE 1 TABLET BY MOUTH DAILY WITH SUPPER. START ON FINAL FULL DAY OF LOVENOX INJECTIONS. 30 tablet 3   pantoprazole (PROTONIX) 40 MG tablet Take 1 tablet (40 mg total) by mouth 2 (two) times daily. (Patient not taking: Reported on 11/16/2020) 60 tablet 0   potassium chloride 20 MEQ/15ML (10%) SOLN Take 15 mLs (20 mEq total) by mouth 2 (two) times daily for 7 days. 210 mL 0   No current facility-administered medications for this visit.    OBJECTIVE: Vitals:   01/19/21 1013  BP: 121/80  Pulse: 77  Resp: 18  Temp: (!) 96.2 F (35.7 C)  SpO2: 100%      Body mass index is 29.75 kg/m.    ECOG FS:2 - Symptomatic, <50% confined to bed  General: Ill-appearing, no acute distress. Eyes: Pink conjunctiva, anicteric sclera. HEENT: Normocephalic, moist mucous membranes. Lungs: No audible wheezing or coughing. Heart: Regular rate and rhythm. Abdomen: Soft, nontender, no obvious distention. Musculoskeletal: No edema, cyanosis, or clubbing. Neuro: Alert, answering all questions appropriately. Cranial nerves grossly intact. Skin: No rashes or petechiae noted. Psych: Normal affect.  LAB RESULTS:  Lab  Results  Component Value Date   NA 138 01/19/2021   K 3.7 01/19/2021   CL 95 (L) 01/19/2021   CO2 28 01/19/2021   GLUCOSE 68 (L) 01/19/2021   BUN 9 01/19/2021   CREATININE 0.76 01/19/2021   CALCIUM 8.2 (L) 01/19/2021   PROT 7.4 01/19/2021   ALBUMIN 3.4 (L) 01/19/2021   AST 26 01/19/2021   ALT 14 01/19/2021   ALKPHOS 110 01/19/2021   BILITOT 1.3 (H) 01/19/2021   GFRNONAA >60 01/19/2021   GFRAA  01/10/2010    >60        The eGFR has been calculated using the MDRD equation. This calculation has not been validated in all clinical situations. eGFR's persistently <60 mL/min signify possible Chronic Kidney Disease.    Lab Results  Component Value Date   WBC 8.5 01/19/2021   NEUTROABS 3.1 01/19/2021   HGB 13.1 01/19/2021   HCT 39.9 01/19/2021   MCV 86.4 01/19/2021   PLT 179 01/19/2021     STUDIES: CT HEAD WO CONTRAST (5MM)  Result Date: 01/03/2021 CLINICAL DATA:  Head trauma fall EXAM: CT HEAD  WITHOUT CONTRAST TECHNIQUE: Contiguous axial images were obtained from the base of the skull through the vertex without intravenous contrast. COMPARISON:  None. FINDINGS: Brain: No acute territorial infarction, or hemorrhage. Possible small extra-axial mass at the left parietal vertex with calcification, sagittal series 5, image 32, measuring 8 mm, probably representing meningioma. The ventricles are non enlarged. Vascular: No hyperdense vessels.  No unexpected calcification Skull: Normal. Negative for fracture or focal lesion. Sinuses/Orbits: No acute finding. Other: None IMPRESSION: 1. No CT evidence for acute intracranial abnormality. 2. Suspicion of small extra-axial mass at the left parietal vertex, which may represent small meningioma Electronically Signed   By: Donavan Foil M.D.   On: 01/03/2021 17:49   CT Angio Chest PE W and/or Wo Contrast  Result Date: 01/03/2021 CLINICAL DATA:  Weakness AFib EXAM: CT ANGIOGRAPHY CHEST WITH CONTRAST TECHNIQUE: Multidetector CT imaging of the  chest was performed using the standard protocol during bolus administration of intravenous contrast. Multiplanar CT image reconstructions and MIPs were obtained to evaluate the vascular anatomy. CONTRAST:  92mL OMNIPAQUE IOHEXOL 350 MG/ML SOLN COMPARISON:  CT 11/23/2020, chest CT 09/18/2020 FINDINGS: Cardiovascular: Satisfactory opacification of the pulmonary arteries to the segmental level. Negative for acute filling defect within the main, lobar or segmental pulmonary vessels. Hypoenhancement right lower lobe subsegmental arteries, for example series 7, image 24. The vessels appear irregular and attenuated in caliber compared to the prior CTA and suspect that findings are likely related to chronic PE. Negative for pericardial effusion. Borderline cardiomegaly. Mediastinum/Nodes: Midline trachea. No thyroid mass. Postsurgical changes of the left lobe. No suspicious adenopathy. Esophagus within normal limits Lungs/Pleura: Small right-sided pleural effusion. Mild ground-glass density in the right lung base, could be secondary to mosaic perfusion. Upper Abdomen: No acute abnormality. Mild stranding in left upper quadrant omentum without change. Musculoskeletal: Incompletely visualized nodularity within the subareolar left breast, without significant change Review of the MIP images confirms the above findings. IMPRESSION: 1. No definite evidence for acute main, lobar, or segmental pulmonary embolus. Poor enhancement of right lower lobe subsegmental vessels with overall attenuated caliber of vessels, suspect that findings are related to sequela of chronic PE given findings on prior angiography. 2. Small right-sided pleural effusion Electronically Signed   By: Donavan Foil M.D.   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  PLAN:    1.  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  Pretreatment CEA was 313, which is now improved to 21.6.  Patient completed cycle 2 of FOLFOX plus Avastin, but then had a delayed reaction possibly secondary to oxaliplatin.  Oxaliplatin has been discontinued.  Patient received cycle 8 of FOLFIRI plus Avastin on December 22, 2020.  She also receives Ziextenzo with pump removal.  Patient would like more time to recover from her hospitalization, therefore she will return to clinic in 2 weeks for further evaluation and consideration of reinitiating treatment which would be cycle 9. 2.  Abdominal pain: Resolved  Continue Percocet as needed. 3.  Renal insufficiency: Resolved. 4.  Hypokalemia: Resolved.  Patient's potassium is 3.7 today.  Continue oral potassium supplementation.  5.  Nausea and vomiting: Resolved.  Continue antiemetics as prescribed.  Patient has also been instructed to take dexamethasone as prescribed. 6.  Poor appetite: Chronic and  unchanged.  Dexamethasone as above.  She declined additional evaluation from dietary. 7.  Neutropenia: Resolved.  Proceed with treatment and continue Ziextenzo with pump removal for subsequent treatments. 8.  PE/DVT: Patient noted to have new onset symptoms in her right lower extremity which revealed a large DVT while taking Eliquis. She was considered an Eliquis failure and subsequently took Lovenox for 1 month, She is now on Xarelto and tolerating treatment well.  9. Anemia: Resolved.   Patient expressed understanding and was in agreement with this plan. She also understands that She can call clinic at any time with any questions, concerns, or complaints.   Cancer Staging Adenocarcinoma of transverse colon [facility]) Staging form: Colon and Rectum - Neuroendocine Tumors, AJCC 8th Edition - Clinical stage from 08/18/2020: Stage IV (cTX, cNX, pM1b) - Signed by Lloyd Huger, MD on 08/18/2020 Stage prefix: Initial diagnosis   Lloyd Huger, MD   01/20/2021 10:15 AM

## 2021-01-21 ENCOUNTER — Ambulatory Visit: Payer: Medicare Other | Admitting: Cardiology

## 2021-01-25 ENCOUNTER — Ambulatory Visit: Payer: Medicare Other | Admitting: Cardiology

## 2021-01-27 ENCOUNTER — Other Ambulatory Visit: Payer: Self-pay

## 2021-01-27 ENCOUNTER — Encounter: Payer: Self-pay | Admitting: Cardiology

## 2021-01-27 ENCOUNTER — Ambulatory Visit (INDEPENDENT_AMBULATORY_CARE_PROVIDER_SITE_OTHER): Payer: Medicare Other | Admitting: Cardiology

## 2021-01-27 VITALS — BP 110/70 | HR 86 | Ht 66.0 in | Wt 178.0 lb

## 2021-01-27 DIAGNOSIS — I480 Paroxysmal atrial fibrillation: Secondary | ICD-10-CM

## 2021-01-27 DIAGNOSIS — I10 Essential (primary) hypertension: Secondary | ICD-10-CM | POA: Diagnosis not present

## 2021-01-27 NOTE — Patient Instructions (Signed)
Medication Instructions:  Your physician recommends that you continue on your current medications as directed. Please refer to the Current Medication list given to you today.  *If you need a refill on your cardiac medications before your next appointment, please call your pharmacy*   Lab Work: None ordered If you have labs (blood work) drawn today and your tests are completely normal, you will receive your results only by: [place] (if you have MyChart) OR A paper copy in the mail If you have any lab test that is abnormal or we need to change your treatment, we will call you to review the results.   Testing/Procedures: None ordered    Follow-Up: At [facility], you and your health needs are our priority.  As part of our continuing mission to provide you with exceptional heart care, we have created designated Provider Care Teams.  These Care Teams include your primary Cardiologist (physician) and Advanced Practice Providers (APPs -  Physician Assistants and Nurse Practitioners) who all work together to provide you with the care you need, when you need it.  We recommend signing up for the patient portal called "MyChart".  Sign up information is provided on this After Visit Summary.  MyChart is used to connect with patients for Virtual Visits (Telemedicine).  Patients are able to view lab/test results, encounter notes, upcoming appointments, etc.  Non-urgent messages can be sent to your provider as well.   To learn more about what you can do with MyChart, go to [email or web address].    Your next appointment:   6 month(s)  The format for your next appointment:   In Person  Provider:   You may see Kate Sable, MD or one of the following Advanced Practice Providers on your designated Care Team:   Murray Hodgkins, NP Christell Faith, PA-C Cadence Kathlen Mody, [place]    Other Instructions

## 2021-01-27 NOTE — Progress Notes (Signed)
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  INTERVAL HISTORY: Patient returns to clinic today for further evaluation and discussion on whether or not to reinitiate treatment.  Her performance status is declining and she continues to have a poor appetite with nausea.  She has increased weakness and fatigue. She has no neurologic complaints.  She denies any fevers.  She denies any chest pain, shortness of breath, cough, hemoptysis.  She denies any vomiting, constipation, or diarrhea.  She has no melena or hematochezia.  She has no urinary complaints.  She has no further leg pain.  Patient feels generally terrible, but offers no further specific complaints.  REVIEW OF SYSTEMS:   Review of Systems  Constitutional:  Positive for malaise/fatigue and weight loss. Negative for fever.  Respiratory: Negative.  Negative for cough, hemoptysis and shortness of breath.   Cardiovascular: Negative.  Negative for chest pain and leg swelling.  Gastrointestinal:  Positive for nausea. Negative for abdominal pain, blood in stool, constipation, diarrhea, melena and vomiting.  Genitourinary: Negative.  Negative for dysuria.  Musculoskeletal: Negative.  Negative for back pain.  Skin: Negative.  Negative for rash.  Neurological:  Positive for weakness. Negative for dizziness, focal weakness and headaches.  Psychiatric/Behavioral: Negative.  The patient is not nervous/anxious.    As per HPI. Otherwise, a complete review of systems is negative.  PAST MEDICAL HISTORY: Past  Medical History:  Diagnosis Date   Arthritis    Chicken pox    Helicobacter pylori gastritis    Hyperlipidemia    Hypertension    Metastatic colon cancer in female [facility])    Obesity    Sleep apnea    Thyroid disease     PAST SURGICAL HISTORY: Past Surgical History:  Procedure Laterality Date   ABDOMINAL HYSTERECTOMY  12/2009   total   COLONOSCOPY WITH PROPOFOL N/A 08/13/2020   Procedure: COLONOSCOPY WITH PROPOFOL;  Surgeon: Jonathon Bellows, MD;  Location: [facility] ENDOSCOPY;  Service: Gastroenterology;  Laterality: N/A;   ESOPHAGOGASTRODUODENOSCOPY (EGD) WITH PROPOFOL N/A 08/13/2020   Procedure: ESOPHAGOGASTRODUODENOSCOPY (EGD) WITH PROPOFOL;  Surgeon: Jonathon Bellows, MD;  Location: [facility] ENDOSCOPY;  Service: Gastroenterology;  Laterality: N/A;   IR IMAGING GUIDED PORT INSERTION  08/27/2020   supracervical abdominal hysterectomy and bilateral salpingo--oophorectomy 01-17-2010 for fibroids  01/17/2010   TOTAL THYROIDECTOMY  1991-92   TUBAL LIGATION      FAMILY HISTORY: Family History  Problem Relation Age of Onset   Stroke Father    Diabetes Maternal Grandmother    Hypertension Maternal Grandmother    Diabetes Maternal Uncle    Cancer Maternal Aunt        Breast    ADVANCED DIRECTIVES (Y/N):  N  HEALTH MAINTENANCE: Social History   Tobacco Use   Smoking status: Never   Smokeless tobacco: Never  Vaping Use   Vaping Use: Never used  Substance Use Topics   Alcohol use: No    Alcohol/week: 0.0 standard drinks   Drug use: No     Colonoscopy:  PAP:  Bone density:  Lipid panel:  Allergies  Allergen Reactions   Erythromycin Itching    Current  Outpatient Medications  Medication Sig Dispense Refill   atorvastatin (LIPITOR) 10 MG tablet TAKE 1 TABLET(10 MG) BY MOUTH DAILY 90 tablet 0   clobetasol ointment (TEMOVATE) 0.05 % Apply to affected area every night for 4 weeks, then every other day for 4 weeks and then twice a week for 4 weeks or until resolution. 30 g 5   feeding  supplement (ENSURE ENLIVE / ENSURE PLUS) LIQD Take 237 mLs by mouth 3 (three) times daily between meals. 237 mL 12   levothyroxine (SYNTHROID) 150 MCG tablet Take 1 tablet (150 mcg total) by mouth daily. 90 tablet 3   metoprolol succinate (TOPROL-XL) 25 MG 24 hr tablet Take 1 tablet (25 mg total) by mouth daily. 30 tablet 0   nystatin (MYCOSTATIN) 100000 UNIT/ML suspension Take 1 teaspoon and swish in mouth then swallow three times daily 120 mL 0   prochlorperazine (COMPAZINE) 10 MG tablet Take 10 mg by mouth 2 (two) times daily as needed for nausea or vomiting.     XARELTO 20 MG TABS tablet TAKE 1 TABLET BY MOUTH DAILY WITH SUPPER. START ON FINAL FULL DAY OF LOVENOX INJECTIONS. 30 tablet 3   oxyCODONE-acetaminophen (PERCOCET/ROXICET) 5-325 MG tablet Take 1 tablet by mouth every 4 (four) hours as needed for severe pain. (Patient not taking: No sig reported) 30 tablet 0   pantoprazole (PROTONIX) 40 MG tablet Take 1 tablet (40 mg total) by mouth 2 (two) times daily. (Patient not taking: No sig reported) 60 tablet 0   potassium chloride 20 MEQ/15ML (10%) SOLN Take 15 mLs (20 mEq total) by mouth 2 (two) times daily for 7 days. (Patient not taking: No sig reported) 210 mL 0   No current facility-administered medications for this visit.   Facility-Administered Medications Ordered in Other Visits  Medication Dose Route Frequency Provider Last Rate Last Admin   heparin lock flush 100 unit/mL  500 Units Intravenous Once Grayland Ormond, Kathlene November, MD       sodium chloride 0.9 % 500 mL with potassium chloride 40 mEq infusion   Intravenous Once Lloyd Huger, MD 260 mL/hr at 02/01/21 [place] at 02/01/21 1211   sodium chloride flush (NS) 0.9 % injection 10 mL  10 mL Intravenous Once Lloyd Huger, MD        OBJECTIVE: Vitals:   02/01/21 1005  BP: 97/70  Pulse: 86  Resp: 17  Temp: (!) 96 F (35.6 C)  SpO2: 99%      Body mass index is 28.25 kg/m.    ECOG FS:2 - Symptomatic, <50% confined to  bed  General: Ill-appearing, no acute distress.  Sitting in a wheelchair. Eyes: Pink conjunctiva, anicteric sclera. HEENT: Normocephalic, moist mucous membranes. Lungs: No audible wheezing or coughing. Heart: Regular rate and rhythm. Abdomen: Soft, nontender, no obvious distention. Musculoskeletal: No edema, cyanosis, or clubbing. Neuro: Alert, answering all questions appropriately. Cranial nerves grossly intact. Skin: No rashes or petechiae noted. Psych: Normal affect.   LAB RESULTS:  Lab Results  Component Value Date   NA 136 02/01/2021   K 2.5 (LL) 02/01/2021   CL 90 (L) 02/01/2021   CO2 28 02/01/2021   GLUCOSE 80 02/01/2021   BUN 13 02/01/2021   CREATININE 0.80 02/01/2021   CALCIUM 7.9 (L) 02/01/2021   PROT 6.7 02/01/2021   ALBUMIN 3.1 (L) 02/01/2021   AST 22 02/01/2021   ALT 13 02/01/2021   ALKPHOS 63 02/01/2021   BILITOT 1.6 (H) 02/01/2021   GFRNONAA >60 02/01/2021   GFRAA  01/10/2010    >60        The eGFR has been calculated using the MDRD equation. This calculation has not been validated in all clinical situations. eGFR's persistently <60 mL/min signify possible Chronic Kidney Disease.    Lab Results  Component Value Date   WBC 7.0 02/01/2021   NEUTROABS 3.4 02/01/2021   HGB 11.3 (L) 02/01/2021   HCT 33.5 (L) 02/01/2021   MCV 88.2 02/01/2021   PLT 384 02/01/2021     STUDIES: CT HEAD WO CONTRAST (5MM)  Result Date: 01/03/2021 CLINICAL DATA:  Head trauma fall EXAM: CT HEAD WITHOUT CONTRAST TECHNIQUE: Contiguous axial images were obtained from the base of the skull through the vertex without intravenous contrast. COMPARISON:  None. FINDINGS: Brain: No acute territorial infarction, or hemorrhage. Possible small extra-axial mass at the left parietal vertex with calcification, sagittal series 5, image 32, measuring 8 mm, probably representing meningioma. The ventricles are non enlarged. Vascular: No hyperdense vessels.  No unexpected calcification Skull:  Normal. Negative for fracture or focal lesion. Sinuses/Orbits: No acute finding. Other: None IMPRESSION: 1. No CT evidence for acute intracranial abnormality. 2. Suspicion of small extra-axial mass at the left parietal vertex, which may represent small meningioma Electronically Signed   By: Donavan Foil M.D.   On: 01/03/2021 17:49   CT Angio Chest PE W and/or Wo Contrast  Result Date: 01/03/2021 CLINICAL DATA:  Weakness AFib EXAM: CT ANGIOGRAPHY CHEST WITH CONTRAST TECHNIQUE: Multidetector CT imaging of the chest was performed using the standard protocol during bolus administration of intravenous contrast. Multiplanar CT image reconstructions and MIPs were obtained to evaluate the vascular anatomy. CONTRAST:  34mL OMNIPAQUE IOHEXOL 350 MG/ML SOLN COMPARISON:  CT 11/23/2020, chest CT 09/18/2020 FINDINGS: Cardiovascular: Satisfactory opacification of the pulmonary arteries to the segmental level. Negative for acute filling defect within the main, lobar or segmental pulmonary vessels. Hypoenhancement right lower lobe subsegmental arteries, for example series 7, image 24. The vessels appear irregular and attenuated in caliber compared to the prior CTA and suspect that findings are likely related to chronic PE. Negative for pericardial effusion. Borderline cardiomegaly. Mediastinum/Nodes: Midline trachea. No thyroid mass. Postsurgical changes of the left lobe. No suspicious adenopathy. Esophagus within normal limits Lungs/Pleura: Small right-sided pleural effusion. Mild ground-glass density in the right lung base, could be secondary to mosaic perfusion. Upper Abdomen: No acute abnormality. Mild stranding in left upper quadrant omentum without change. Musculoskeletal: Incompletely visualized nodularity within the subareolar left breast, without significant change Review of the MIP images confirms the above findings. IMPRESSION: 1. No definite evidence for acute main, lobar, or segmental pulmonary embolus. Poor  enhancement of right lower lobe subsegmental vessels with overall attenuated caliber of vessels, suspect that findings are related to sequela of chronic PE given findings on prior angiography. 2. Small right-sided pleural effusion Electronically Signed   By: Donavan Foil M.D.   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  PLAN:    1.  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  Pretreatment CEA was 313, which is now improved to 21.6.  Patient completed cycle 2 of FOLFOX plus Avastin, but then had a delayed reaction possibly secondary to oxaliplatin.  Oxaliplatin has been discontinued.  Patient received cycle 8 of FOLFIRI plus Avastin  on December 22, 2020.  She also receives Ziextenzo with pump removal.  Patient continues to have a decreased performance status, therefore will continue to hold treatment for another 2 weeks.  Proceed with IV fluids today.  Patient return to clinic in 1 week for additional IV fluids and then in 2 weeks for further evaluation, repeat imaging, and consideration of reinitiation of treatment.  We will also consult palliative care. 2.  Abdominal pain: Resolved  Continue Percocet as needed. 3.  Renal insufficiency: Resolved. 4.  Hypokalemia: Potassium level significantly worse.  Patient will receive 40 mill equivalents of IV potassium today and return to clinic in 1 week for additional treatment if necessary. 5.  Nausea: Patient states dexamethasone does not help.  Continue Zofran as needed.  Patient also received IV steroids and Zofran today along with her IV fluids.   6.  Poor appetite: Chronic and unchanged.  Dexamethasone as above.  She declined additional evaluation from dietary. 7.  Neutropenia: Resolved.   Proceed with treatment and continue Ziextenzo with pump removal for subsequent treatments. 8.  PE/DVT: Patient noted to have new onset symptoms in her right lower extremity which revealed a large DVT while taking Eliquis. She was considered an Eliquis failure and subsequently took Lovenox for 1 month, She is now on Xarelto and tolerating treatment well.  9.  Mild, monitor. 10.  Hyperbilirubinemia: Patient's bilirubin has trended up to 1.6.  Repeat imaging as above. 11.  Hypotension: IV fluids as above.   Patient expressed understanding and was in agreement with this plan. She also understands that She can call clinic at any time with any questions, concerns, or complaints.   Cancer Staging Adenocarcinoma of transverse colon [facility]) Staging form: Colon and Rectum - Neuroendocine Tumors, AJCC 8th Edition - Clinical stage from 08/18/2020: Stage IV (cTX, cNX, pM1b) - Signed by Lloyd Huger, MD on 08/18/2020 Stage prefix: Initial diagnosis   Lloyd Huger, MD   02/01/2021 12:47 PM

## 2021-01-27 NOTE — Progress Notes (Signed)
Cardiology Office Note:    Date:  01/27/2021   ID:  Judith Ross, DOB 16-Feb-1956, MRN [id number]  PCP:  Venia Carbon, MD   [facility] Providers Cardiologist:  Kate Sable, MD     Referring MD: Venia Carbon, MD   Chief Complaint  Patient presents with   OTher    Follow up [facility] viosit-- Patient c.o SOB. Meds reviewed verbally with patient.     History of Present Illness:    Judith Ross is a 65 y.o. female with a hx of hypertension, hyperlipidemia, paroxysmal atrial fibrillation, metastatic colon cancer on chemo, PE on Xarelto, presenting to establish care.  Patient admitted last month due to generalized weakness and dizziness followed by fall.  Found to be hypokalemic, head CT with no acute findings possible small meningioma.  CT chest with no evidence of PE.  EKG showed new onset atrial fibrillation with rapid ventricular response.  She was given IV Cardizem and Cardizem drip with conversion to sinus rhythm.  Also had diarrhea managed with Imodium.  Started on Toprol-XL 25 mg daily for rate control upon discharge.  Xarelto 20 mg daily was continued.  Denies any episodes of palpitations.  Gets chemotherapy every 2 weeks.  States having metallic taste, decreased appetite, weight loss.  Echocardiogram 09/19/2020 EF 70%, normal LV size, moderate RV dilatation.  Past Medical History:  Diagnosis Date   Arthritis    Chicken pox    Helicobacter pylori gastritis    Hyperlipidemia    Hypertension    Metastatic colon cancer in female [facility])    Obesity    Sleep apnea    Thyroid disease     Past Surgical History:  Procedure Laterality Date   ABDOMINAL HYSTERECTOMY  12/2009   total   COLONOSCOPY WITH PROPOFOL N/A 08/13/2020   Procedure: COLONOSCOPY WITH PROPOFOL;  Surgeon: Jonathon Bellows, MD;  Location: [facility] ENDOSCOPY;  Service: Gastroenterology;  Laterality: N/A;   ESOPHAGOGASTRODUODENOSCOPY (EGD) WITH PROPOFOL N/A 08/13/2020   Procedure:  ESOPHAGOGASTRODUODENOSCOPY (EGD) WITH PROPOFOL;  Surgeon: Jonathon Bellows, MD;  Location: [facility] ENDOSCOPY;  Service: Gastroenterology;  Laterality: N/A;   IR IMAGING GUIDED PORT INSERTION  08/27/2020   supracervical abdominal hysterectomy and bilateral salpingo--oophorectomy 01-17-2010 for fibroids  01/17/2010   TOTAL THYROIDECTOMY  1991-92   TUBAL LIGATION      Current Medications: Current Meds  Medication Sig   atorvastatin (LIPITOR) 10 MG tablet TAKE 1 TABLET(10 MG) BY MOUTH DAILY   clobetasol ointment (TEMOVATE) 0.05 % Apply to affected area every night for 4 weeks, then every other day for 4 weeks and then twice a week for 4 weeks or until resolution.   feeding supplement (ENSURE ENLIVE / ENSURE PLUS) LIQD Take 237 mLs by mouth 3 (three) times daily between meals.   levothyroxine (SYNTHROID) 150 MCG tablet Take 1 tablet (150 mcg total) by mouth daily.   metoprolol succinate (TOPROL-XL) 25 MG 24 hr tablet Take 1 tablet (25 mg total) by mouth daily.   nystatin (MYCOSTATIN) 100000 UNIT/ML suspension Take 1 teaspoon and swish in mouth then swallow three times daily   prochlorperazine (COMPAZINE) 10 MG tablet Take 10 mg by mouth 2 (two) times daily as needed for nausea or vomiting.   XARELTO 20 MG TABS tablet TAKE 1 TABLET BY MOUTH DAILY WITH SUPPER. START ON FINAL FULL DAY OF LOVENOX INJECTIONS.     Allergies:   Erythromycin   Social History   Socioeconomic History   Marital status: Divorced  Spouse name: Not on file   Number of children: Not on file   Years of education: Not on file   Highest education level: Not on file  Occupational History   Not on file  Tobacco Use   Smoking status: Never   Smokeless tobacco: Never  Vaping Use   Vaping Use: Never used  Substance and Sexual Activity   Alcohol use: No    Alcohol/week: 0.0 standard drinks   Drug use: No   Sexual activity: Yes  Other Topics Concern   Not on file  Social History Narrative   Not on file   Social  Determinants of Health   Financial Resource Strain: Not on file  Food Insecurity: Not on file  Transportation Needs: Not on file  Physical Activity: Not on file  Stress: Not on file  Social Connections: Not on file     Family History: The patient's family history includes Cancer in her maternal aunt; Diabetes in her maternal grandmother and maternal uncle; Hypertension in her maternal grandmother; Stroke in her father.  ROS:   Please see the history of present illness.     All other systems reviewed and are negative.  EKGs/Labs/Other Studies Reviewed:    The following studies were reviewed today:   EKG:  EKG is  ordered today.  The ekg ordered today demonstrates normal sinus rhythm  Recent Labs: 09/18/2020: B Natriuretic Peptide 360.0 01/11/2021: Magnesium 1.9 01/18/2021: TSH 1.13 01/19/2021: ALT 14; BUN 9; Creatinine, Ser 0.76; Hemoglobin 13.1; Platelets 179; Potassium 3.7; Sodium 138  Recent Lipid Panel    Component Value Date/Time   CHOL 198 07/27/2020 1415   TRIG 95.0 07/27/2020 1415   HDL 47.40 07/27/2020 1415   CHOLHDL 4 07/27/2020 1415   VLDL 19.0 07/27/2020 1415   LDLCALC 131 (H) 07/27/2020 1415     Risk Assessment/Calculations:          Physical Exam:    VS:  BP 110/70 (BP Location: Left Arm, Patient Position: Sitting, Cuff Size: Normal)   Pulse 86   Ht 5\' 6"  (1.676 m)   Wt 178 lb (80.7 kg)   BMI 28.73 kg/m     Wt Readings from Last 3 Encounters:  01/27/21 178 lb (80.7 kg)  01/19/21 184 lb 4.8 oz (83.6 kg)  01/10/21 180 lb 12.4 oz (82 kg)     GEN:  Well nourished, well developed in no acute distress HEENT: Normal NECK: No JVD; No carotid bruits LYMPHATICS: No lymphadenopathy CARDIAC: RRR, no murmurs, rubs, gallops RESPIRATORY:  Clear to auscultation without rales, wheezing or rhonchi  ABDOMEN: Soft, non-tender, non-distended MUSCULOSKELETAL:  No edema; No deformity  SKIN: Warm and dry NEUROLOGIC:  Alert and oriented x 3 PSYCHIATRIC:  Normal  affect   ASSESSMENT:    1. Paroxysmal atrial fibrillation (HCC)   2. Primary hypertension    PLAN:    In order of problems listed above:  Paroxysmal atrial fibrillation, CHA2DS2-VASc score 3 (htn, age, gender).  Maintaining sinus rhythm.  Continue Toprol-XL 25 mg daily, Xarelto 20 mg daily.  Echocardiogram with EF 70%, normal LA size. Hypertension, BP controlled.  Follow-up in 6 months.        Medication Adjustments/Labs and Tests Ordered: Current medicines are reviewed at length with the patient today.  Concerns regarding medicines are outlined above.  Orders Placed This Encounter  Procedures   EKG 12-Lead   No orders of the defined types were placed in this encounter.   Patient Instructions  Medication Instructions:  Your  physician recommends that you continue on your current medications as directed. Please refer to the Current Medication list given to you today.  *If you need a refill on your cardiac medications before your next appointment, please call your pharmacy*   Lab Work: None ordered If you have labs (blood work) drawn today and your tests are completely normal, you will receive your results only by: [place] (if you have MyChart) OR A paper copy in the mail If you have any lab test that is abnormal or we need to change your treatment, we will call you to review the results.   Testing/Procedures: None ordered    Follow-Up: At [facility], you and your health needs are our priority.  As part of our continuing mission to provide you with exceptional heart care, we have created designated Provider Care Teams.  These Care Teams include your primary Cardiologist (physician) and Advanced Practice Providers (APPs -  Physician Assistants and Nurse Practitioners) who all work together to provide you with the care you need, when you need it.  We recommend signing up for the patient portal called "MyChart".  Sign up information is provided on this After  Visit Summary.  MyChart is used to connect with patients for Virtual Visits (Telemedicine).  Patients are able to view lab/test results, encounter notes, upcoming appointments, etc.  Non-urgent messages can be sent to your provider as well.   To learn more about what you can do with MyChart, go to [email or web address].    Your next appointment:   6 month(s)  The format for your next appointment:   In Person  Provider:   You may see Kate Sable, MD or one of the following Advanced Practice Providers on your designated Care Team:   Murray Hodgkins, NP Christell Faith, PA-C Cadence Kathlen Mody, [place]    Other Instructions    Signed, Kate Sable, MD  01/27/2021 12:44 PM    [place]

## 2021-01-28 ENCOUNTER — Other Ambulatory Visit: Payer: Self-pay | Admitting: Internal Medicine

## 2021-01-31 ENCOUNTER — Ambulatory Visit: Payer: Medicare Other | Admitting: Cardiology

## 2021-02-01 ENCOUNTER — Other Ambulatory Visit: Payer: Self-pay

## 2021-02-01 ENCOUNTER — Inpatient Hospital Stay (HOSPITAL_BASED_OUTPATIENT_CLINIC_OR_DEPARTMENT_OTHER): Payer: Medicare Other | Admitting: Oncology

## 2021-02-01 ENCOUNTER — Encounter: Payer: Self-pay | Admitting: Oncology

## 2021-02-01 ENCOUNTER — Inpatient Hospital Stay: Payer: Medicare Other

## 2021-02-01 VITALS — BP 97/70 | HR 86 | Temp 96.0°F | Resp 17 | Wt 175.0 lb

## 2021-02-01 DIAGNOSIS — C184 Malignant neoplasm of transverse colon: Secondary | ICD-10-CM | POA: Diagnosis not present

## 2021-02-01 LAB — COMPREHENSIVE METABOLIC PANEL
ALT: 13 U/L (ref 0–44)
AST: 22 U/L (ref 15–41)
Albumin: 3.1 g/dL — ABNORMAL LOW (ref 3.5–5.0)
Alkaline Phosphatase: 63 U/L (ref 38–126)
Anion gap: 18 — ABNORMAL HIGH (ref 5–15)
BUN: 13 mg/dL (ref 8–23)
CO2: 28 mmol/L (ref 22–32)
Calcium: 7.9 mg/dL — ABNORMAL LOW (ref 8.9–10.3)
Chloride: 90 mmol/L — ABNORMAL LOW (ref 98–111)
Creatinine, Ser: 0.8 mg/dL (ref 0.44–1.00)
GFR, Estimated: >60 mL/min (ref >60)
Glucose, Bld: 80 mg/dL (ref 70–99)
Potassium: 2.5 mmol/L — CL (ref 3.5–5.1)
Sodium: 136 mmol/L (ref 135–145)
Total Bilirubin: 1.6 mg/dL — ABNORMAL HIGH (ref 0.3–1.2)
Total Protein: 6.7 g/dL (ref 6.5–8.1)

## 2021-02-01 LAB — CBC WITH DIFFERENTIAL/PLATELET
Abs Immature Granulocytes: 0.02 10*3/uL (ref 0.00–0.07)
Basophils Absolute: 0 10*3/uL (ref 0.0–0.1)
Basophils Relative: 0 %
Eosinophils Absolute: 0 10*3/uL (ref 0.0–0.5)
Eosinophils Relative: 0 %
HCT: 33.5 % — ABNORMAL LOW (ref 36.0–46.0)
Hemoglobin: 11.3 g/dL — ABNORMAL LOW (ref 12.0–15.0)
Immature Granulocytes: 0 %
Lymphocytes Relative: 40 %
Lymphs Abs: 2.8 10*3/uL (ref 0.7–4.0)
MCH: 29.7 pg (ref 26.0–34.0)
MCHC: 33.7 g/dL (ref 30.0–36.0)
MCV: 88.2 fL (ref 80.0–100.0)
Monocytes Absolute: 0.8 10*3/uL (ref 0.1–1.0)
Monocytes Relative: 11 %
Neutro Abs: 3.4 10*3/uL (ref 1.7–7.7)
Neutrophils Relative %: 49 %
Platelets: 384 10*3/uL (ref 150–400)
RBC: 3.8 MIL/uL — ABNORMAL LOW (ref 3.87–5.11)
RDW: 21.9 % — ABNORMAL HIGH (ref 11.5–15.5)
WBC: 7 10*3/uL (ref 4.0–10.5)
nRBC: 0 % (ref 0.0–0.2)

## 2021-02-01 LAB — URINALYSIS, DIPSTICK ONLY
Glucose, UA: NEGATIVE mg/dL
Hgb urine dipstick: NEGATIVE
Ketones, ur: 20 mg/dL — AB
Nitrite: NEGATIVE
Protein, ur: 100 mg/dL — AB
Specific Gravity, Urine: 1.021 (ref 1.005–1.030)
pH: 5 (ref 5.0–8.0)

## 2021-02-01 MED ORDER — HEPARIN SOD (PORK) LOCK FLUSH 100 UNIT/ML IV SOLN
500.0000 [IU] | Freq: Once | INTRAVENOUS | Status: AC
  Administered 2021-02-01: 500 [IU] via INTRAVENOUS
  Filled 2021-02-01: qty 5

## 2021-02-01 MED ORDER — SODIUM CHLORIDE 0.9% FLUSH
10.0000 mL | Freq: Once | INTRAVENOUS | Status: DC
  Filled 2021-02-01: qty 10

## 2021-02-01 MED ORDER — ONDANSETRON HCL 4 MG/2ML IJ SOLN
8.0000 mg | Freq: Once | INTRAMUSCULAR | Status: AC
  Administered 2021-02-01: 8 mg via INTRAVENOUS
  Filled 2021-02-01: qty 4

## 2021-02-01 MED ORDER — SODIUM CHLORIDE 0.9 % IV SOLN
10.0000 mg | Freq: Once | INTRAVENOUS | Status: AC
  Administered 2021-02-01: 10 mg via INTRAVENOUS
  Filled 2021-02-01: qty 1

## 2021-02-01 MED ORDER — SODIUM CHLORIDE 0.9 % IV SOLN
Freq: Once | INTRAVENOUS | Status: AC
  Filled 2021-02-01: qty 250

## 2021-02-01 MED ORDER — SODIUM CHLORIDE 0.9 % IV SOLN
Freq: Once | INTRAVENOUS | Status: AC
  Filled 2021-02-01: qty 20

## 2021-02-01 MED ORDER — SODIUM CHLORIDE 0.9 % IV SOLN: Freq: Once | INTRAVENOUS | Status: DC

## 2021-02-01 MED ORDER — HEPARIN SOD (PORK) LOCK FLUSH 100 UNIT/ML IV SOLN
INTRAVENOUS | Status: AC
  Filled 2021-02-01: qty 5

## 2021-02-01 NOTE — Progress Notes (Signed)
Patient here for oncology follow-up appointment, concerns of weakness, nausea and low BP

## 2021-02-01 NOTE — Patient Instructions (Signed)
Dehydration, Adult Dehydration is condition in which there is not enough water or other fluids in the body. This happens when a person loses more fluids than he or she takes in. Important body parts cannot work right without the right amount of fluids. Any loss of fluids from the body can cause dehydration. Dehydration can be mild, worse, or very bad. It should be treated right away to keep it from getting very bad. What are the causes? This condition may be caused by: Conditions that cause loss of water or other fluids, such as: Watery poop (diarrhea). Vomiting. Sweating a lot. Peeing (urinating) a lot. Not drinking enough fluids, especially when you: Are ill. Are doing things that take a lot of energy to do. Other illnesses and conditions, such as fever or infection. Certain medicines, such as medicines that take extra fluid out of the body (diuretics). Lack of safe drinking water. Not being able to get enough water and food. What increases the risk? The following factors may make you more likely to develop this condition: Having a long-term (chronic) illness that has not been treated the right way, such as: Diabetes. Heart disease. Kidney disease. Being 74 years of age or older. Having a disability. Living in a place that is high above the ground or sea (high in altitude). The thinner, dried air causes more fluid loss. Doing exercises that put stress on your body for a long time. What are the signs or symptoms? Symptoms of dehydration depend on how bad it is. Mild or worse dehydration Thirst. Dry lips or dry mouth. Feeling dizzy or light-headed, especially when you stand up from sitting. Muscle cramps. Your body making: Dark pee (urine). Pee may be the color of tea. Less pee than normal. Less tears than normal. Headache. Very bad dehydration Changes in skin. Skin may: Be cold to the touch (clammy). Be blotchy or pale. Not go back to normal right after you lightly pinch  it and let it go. Little or no tears, pee, or sweat. Changes in vital signs, such as: Fast breathing. Low blood pressure. Weak pulse. Pulse that is more than 100 beats a minute when you are sitting still. Other changes, such as: Feeling very thirsty. Eyes that look hollow (sunken). Cold hands and feet. Being mixed up (confused). Being very tired (lethargic) or having trouble waking from sleep. Short-term weight loss. Loss of consciousness. How is this treated? Treatment for this condition depends on how bad it is. Treatment should start right away. Do not wait until your condition gets very bad. Very bad dehydration is an emergency. You will need to go to a hospital. Mild or worse dehydration can be treated at home. You may be asked to: Drink more fluids. Drink an oral rehydration solution (ORS). This drink helps get the right amounts of fluids and salts and minerals in the blood (electrolytes). Very bad dehydration can be treated: With fluids through an IV tube. By getting normal levels of salts and minerals in your blood. This is often done by giving salts and minerals through a tube. The tube is passed through your nose and into your stomach. By treating the root cause. Follow these instructions at home: Oral rehydration solution If told by your doctor, drink an ORS: Make an ORS. Use instructions on the package. Start by drinking small amounts, about  cup (120 mL) every 5-10 minutes. Slowly drink more until you have had the amount that your doctor said to have. Eating and drinking  Drink enough clear fluid to keep your pee pale yellow. If you were told to drink an ORS, finish the ORS first. Then, start slowly drinking other clear fluids. Drink fluids such as: Water. Do not drink only water. Doing that can make the salt (sodium) level in your body get too low. Water from ice chips you suck on. Fruit juice that you have added water to (diluted). Low-calorie sports  drinks. Eat foods that have the right amounts of salts and minerals, such as: Bananas. Oranges. Potatoes. Tomatoes. Spinach. Do not drink alcohol. Avoid: Drinks that have a lot of sugar. These include: High-calorie sports drinks. Fruit juice that you did not add water to. Soda. Caffeine. Foods that are greasy or have a lot of fat or sugar. General instructions Take over-the-counter and prescription medicines only as told by your doctor. Do not take salt tablets. Doing that can make the salt level in your body get too high. Return to your normal activities as told by your doctor. Ask your doctor what activities are safe for you. Keep all follow-up visits as told by your doctor. This is important. Contact a doctor if: You have pain in your belly (abdomen) and the pain: Gets worse. Stays in one place. You have a rash. You have a stiff neck. You get angry or annoyed (irritable) more easily than normal. You are more tired or have a harder time waking than normal. You feel: Weak or dizzy. Very thirsty. Get help right away if you have: Any symptoms of very bad dehydration. Symptoms of vomiting, such as: You cannot eat or drink without vomiting. Your vomiting gets worse or does not go away. Your vomit has blood or green stuff in it. Symptoms that get worse with treatment. A fever. A very bad headache. Problems with peeing or pooping (having a bowel movement), such as: Watery poop that gets worse or does not go away. Blood in your poop (stool). This may cause poop to look black and tarry. Not peeing in 6-8 hours. Peeing only a small amount of very dark pee in 6-8 hours. Trouble breathing. These symptoms may be an emergency. Do not wait to see if the symptoms will go away. Get medical help right away. Call your local emergency services (911 in the [place] Do not drive yourself to the hospital. Summary Dehydration is a condition in which there is not enough water or other fluids  in the body. This happens when a person loses more fluids than he or she takes in. Treatment for this condition depends on how bad it is. Treatment should be started right away. Do not wait until your condition gets very bad. Drink enough clear fluid to keep your pee pale yellow. If you were told to drink an oral rehydration solution (ORS), finish the ORS first. Then, start slowly drinking other clear fluids. Take over-the-counter and prescription medicines only as told by your doctor. Get help right away if you have any symptoms of very bad dehydration. This information is not intended to replace advice given to you by your health care provider. Make sure you discuss any questions you have with your health care provider. Document Revised: 10/17/2018 Document Reviewed: 10/17/2018 Elsevier Patient Education  [place]. Potassium Acetate Injection What is this medication? POTASSIUM ACETATE (poe TASS i um ASa tate) prevents and treats low levels of potassium in your body. Potassium plays an important role in maintaining the health of your kidneys, heart, muscles, and nervous system. This medicine may be  used for other purposes; ask your health care provider or pharmacist if you have questions. What should I tell my care team before I take this medication? They need to know if you have any of these conditions: Addison's disease Dehydration Diabetes Heart disease High levels of potassium in the blood Irregular heartbeat Kidney disease Liver disease Recent severe burn An unusual or allergic reaction to potassium, other medications, foods, dyes, or preservatives Pregnant or trying to get pregnant Breast-feeding How should I use this medication? This medication is for infusion into a vein. It is given in a hospital or clinic setting. Talk to your care team about the use of this medication in children. Special care may be needed. Overdosage: If you think you have taken too much of this  medicine contact a poison control center or emergency room at once. NOTE: This medicine is only for you. Do not share this medicine with others. What if I miss a dose? This does not apply. What may interact with this medication? Do not take this medication with any of the following: Certain diuretics such as spironolactone, triamterene Eplerenone Sodium polystyrene sulfonate This medication may also interact with the following: Certain medications for blood pressure or heart disease like lisinopril, losartan, quinapril, valsartan Medications that lower your chance of fighting infection such as cyclosporine, tacrolimus NSAIDs, medications for pain and inflammation, like ibuprofen or naproxen Other potassium supplements Salt substitutes This list may not describe all possible interactions. Give your health care provider a list of all the medicines, herbs, non-prescription drugs, or dietary supplements you use. Also tell them if you smoke, drink alcohol, or use illegal drugs. Some items may interact with your medicine. What should I watch for while using this medication? Your condition will be monitored carefully while you are receiving this medication. You may need blood work done while you are taking this medication. What side effects may I notice from receiving this medication? Side effects that you should report to your care team as soon as possible: Allergic reactions--skin rash, itching, hives, swelling of the face, lips, tongue, or throat High potassium level--muscle weakness, fast or irregular heartbeat Side effects that usually do not require medical attention (report these to your care team if they continue or are bothersome): Pain, redness, or irritation at injection site This list may not describe all possible side effects. Call your doctor for medical advice about side effects. You may report side effects to FDA at 1-800-FDA-1088. Where should I keep my medication? This  medication is given in a hospital or clinic and will not be stored at home. NOTE: This sheet is a summary. It may not cover all possible information. If you have questions about this medicine, talk to your doctor, pharmacist, or health care provider.  2022 Elsevier/Gold Standard (2020-11-11 00:00:00)

## 2021-02-02 ENCOUNTER — Other Ambulatory Visit: Payer: Self-pay | Admitting: Family Medicine

## 2021-02-03 ENCOUNTER — Inpatient Hospital Stay: Payer: Medicare Other

## 2021-02-04 NOTE — Telephone Encounter (Signed)
Tried to call pt to see if she needs medication. This was prescribed by Dr Glori Bickers a few weeks ago for [place]. Unsure if automatic refill.

## 2021-02-06 ENCOUNTER — Other Ambulatory Visit: Payer: Self-pay | Admitting: Nurse Practitioner

## 2021-02-08 ENCOUNTER — Inpatient Hospital Stay: Payer: Medicare Other

## 2021-02-08 ENCOUNTER — Ambulatory Visit: Payer: Medicare Other

## 2021-02-08 ENCOUNTER — Ambulatory Visit
Admission: RE | Admit: 2021-02-08 | Discharge: 2021-02-08 | Disposition: A | Discharge status: Home / Self Care | Payer: Medicare Other | Source: Ambulatory Visit | Attending: Hospice and Palliative Medicine | Admitting: Hospice and Palliative Medicine

## 2021-02-08 ENCOUNTER — Other Ambulatory Visit: Payer: Self-pay

## 2021-02-08 ENCOUNTER — Inpatient Hospital Stay (HOSPITAL_BASED_OUTPATIENT_CLINIC_OR_DEPARTMENT_OTHER): Payer: Medicare Other | Admitting: Hospice and Palliative Medicine

## 2021-02-08 VITALS — BP 129/92 | HR 99 | Temp 98.5°F | Resp 16

## 2021-02-08 DIAGNOSIS — C184 Malignant neoplasm of transverse colon: Secondary | ICD-10-CM | POA: Diagnosis not present

## 2021-02-08 DIAGNOSIS — E860 Dehydration: Secondary | ICD-10-CM

## 2021-02-08 LAB — COMPREHENSIVE METABOLIC PANEL
ALT: 12 U/L (ref 0–44)
AST: 24 U/L (ref 15–41)
Albumin: 3.1 g/dL — ABNORMAL LOW (ref 3.5–5.0)
Alkaline Phosphatase: 57 U/L (ref 38–126)
Anion gap: 16 — ABNORMAL HIGH (ref 5–15)
BUN: 14 mg/dL (ref 8–23)
CO2: 30 mmol/L (ref 22–32)
Calcium: 8.1 mg/dL — ABNORMAL LOW (ref 8.9–10.3)
Chloride: 94 mmol/L — ABNORMAL LOW (ref 98–111)
Creatinine, Ser: 0.83 mg/dL (ref 0.44–1.00)
GFR, Estimated: >60 mL/min (ref >60)
Glucose, Bld: 111 mg/dL — ABNORMAL HIGH (ref 70–99)
Potassium: 2.5 mmol/L — CL (ref 3.5–5.1)
Sodium: 140 mmol/L (ref 135–145)
Total Bilirubin: 1.4 mg/dL — ABNORMAL HIGH (ref 0.3–1.2)
Total Protein: 6.6 g/dL (ref 6.5–8.1)

## 2021-02-08 LAB — CBC WITH DIFFERENTIAL/PLATELET
Abs Immature Granulocytes: 0.04 10*3/uL (ref 0.00–0.07)
Basophils Absolute: 0 10*3/uL (ref 0.0–0.1)
Basophils Relative: 0 %
Eosinophils Absolute: 0.1 10*3/uL (ref 0.0–0.5)
Eosinophils Relative: 1 %
HCT: 36.4 % (ref 36.0–46.0)
Hemoglobin: 11.8 g/dL — ABNORMAL LOW (ref 12.0–15.0)
Immature Granulocytes: 1 %
Lymphocytes Relative: 27 %
Lymphs Abs: 2.2 10*3/uL (ref 0.7–4.0)
MCH: 29.2 pg (ref 26.0–34.0)
MCHC: 32.4 g/dL (ref 30.0–36.0)
MCV: 90.1 fL (ref 80.0–100.0)
Monocytes Absolute: 0.6 10*3/uL (ref 0.1–1.0)
Monocytes Relative: 8 %
Neutro Abs: 5 10*3/uL (ref 1.7–7.7)
Neutrophils Relative %: 63 %
Platelets: 347 10*3/uL (ref 150–400)
RBC: 4.04 MIL/uL (ref 3.87–5.11)
RDW: 22.7 % — ABNORMAL HIGH (ref 11.5–15.5)
WBC: 7.9 10*3/uL (ref 4.0–10.5)
nRBC: 0.9 % — ABNORMAL HIGH (ref 0.0–0.2)

## 2021-02-08 LAB — MAGNESIUM: Magnesium: 1.8 mg/dL (ref 1.7–2.4)

## 2021-02-08 MED ORDER — SODIUM CHLORIDE 0.9% FLUSH
10.0000 mL | Freq: Once | INTRAVENOUS | Status: DC | PRN
  Filled 2021-02-08: qty 10

## 2021-02-08 MED ORDER — ALTEPLASE 2 MG IJ SOLR
2.0000 mg | Freq: Once | INTRAMUSCULAR | Status: DC | PRN
  Filled 2021-02-08: qty 2

## 2021-02-08 MED ORDER — HEPARIN SOD (PORK) LOCK FLUSH 100 UNIT/ML IV SOLN
INTRAVENOUS | Status: AC
  Filled 2021-02-08: qty 5

## 2021-02-08 MED ORDER — ONDANSETRON HCL 4 MG/2ML IJ SOLN
8.0000 mg | Freq: Once | INTRAMUSCULAR | Status: AC
  Administered 2021-02-08: 8 mg via INTRAVENOUS
  Filled 2021-02-08: qty 4

## 2021-02-08 MED ORDER — SODIUM CHLORIDE 0.9 % IV SOLN
Freq: Once | INTRAVENOUS | Status: DC
  Filled 2021-02-08: qty 250

## 2021-02-08 MED ORDER — SODIUM CHLORIDE 0.9 % IV SOLN
10.0000 mg | Freq: Once | INTRAVENOUS | Status: AC
  Administered 2021-02-08: 10 mg via INTRAVENOUS
  Filled 2021-02-08: qty 10

## 2021-02-08 MED ORDER — SODIUM CHLORIDE 0.9 % IV SOLN
Freq: Once | INTRAVENOUS | Status: AC
  Filled 2021-02-08: qty 20

## 2021-02-08 MED ORDER — IOHEXOL 300 MG/ML  SOLN
100.0000 mL | Freq: Once | INTRAMUSCULAR | Status: AC | PRN
  Administered 2021-02-08: 100 mL via INTRAVENOUS

## 2021-02-08 MED ORDER — HEPARIN SOD (PORK) LOCK FLUSH 100 UNIT/ML IV SOLN
250.0000 [IU] | Freq: Once | INTRAVENOUS | Status: DC | PRN
  Filled 2021-02-08: qty 5

## 2021-02-08 MED ORDER — SODIUM CHLORIDE 0.9% FLUSH
3.0000 mL | Freq: Once | INTRAVENOUS | Status: DC | PRN
  Filled 2021-02-08: qty 3

## 2021-02-08 MED ORDER — DEXAMETHASONE 2 MG PO TABS: 2.0000 mg | ORAL_TABLET | Freq: Every day | ORAL | 0 refills | Status: DC

## 2021-02-08 MED ORDER — MIRTAZAPINE 7.5 MG PO TABS: 7.5000 mg | ORAL_TABLET | Freq: Every day | ORAL | 0 refills | Status: DC

## 2021-02-08 MED ORDER — SODIUM CHLORIDE 0.9 % IV SOLN
Freq: Once | INTRAVENOUS | Status: AC
  Filled 2021-02-08: qty 250

## 2021-02-08 MED ORDER — SODIUM CHLORIDE 0.9 % IV SOLN: Freq: Once | INTRAVENOUS | Status: DC

## 2021-02-08 MED ORDER — HEPARIN SOD (PORK) LOCK FLUSH 100 UNIT/ML IV SOLN
500.0000 [IU] | Freq: Once | INTRAVENOUS | Status: AC | PRN
  Administered 2021-02-08: 500 [IU]
  Filled 2021-02-08: qty 5

## 2021-02-08 NOTE — Progress Notes (Signed)
Judith Ross  [phone] [phone]   Name: Judith Ross Date: 02/08/2021 MRN: [id number]  DOB: 09/07/1955  Patient Care Team: Venia Carbon, MD as PCP - General (Internal Medicine) Kate Sable, MD as PCP - Cardiology (Cardiology) Clent Jacks, RN as Oncology Nurse Navigator Grayland Ormond, Kathlene November, MD as Consulting Physician (Hematology and Oncology)    REASON FOR CONSULTATION: Judith Ross is a 65 y.o. 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  Patient was hospitalized 10/72/22-01/11/2021 with A. fib with RVR and persistent hypokalemia.  She has had persistent fatigue and weakness.  Patient is on systemic chemotherapy.  Palliative care was consulted to address goals and manage ongoing symptoms.  SOCIAL HISTORY:     reports that she has never smoked. She has never used smokeless tobacco. She reports that she does not drink alcohol and does not use drugs.  Patient is divorced.  She lives at home with a grandson.  She has a son and a friend, Silva Bandy, who is involved in her care.  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  ADVANCE DIRECTIVES:  Not on file  CODE STATUS:   PAST MEDICAL HISTORY: Past Medical History:  Diagnosis Date   Arthritis    Chicken pox    Helicobacter pylori gastritis    Hyperlipidemia    Hypertension    Metastatic colon cancer in female [facility])    Obesity    Sleep apnea    Thyroid disease     PAST SURGICAL HISTORY:  Past Surgical History:  Procedure Laterality Date   ABDOMINAL HYSTERECTOMY  12/2009   total   COLONOSCOPY WITH PROPOFOL N/A 08/13/2020   Procedure: COLONOSCOPY WITH PROPOFOL;  Surgeon: Jonathon Bellows, MD;  [facility];  Service: Gastroenterology;  Laterality: N/A;   ESOPHAGOGASTRODUODENOSCOPY (EGD) WITH  PROPOFOL N/A 08/13/2020   Procedure: ESOPHAGOGASTRODUODENOSCOPY (EGD) WITH PROPOFOL;  Surgeon: Jonathon Bellows, MD;  Location: [facility] ENDOSCOPY;  Service: Gastroenterology;  Laterality: N/A;   IR IMAGING GUIDED PORT INSERTION  08/27/2020   supracervical abdominal hysterectomy and bilateral salpingo--oophorectomy 01-17-2010 for fibroids  01/17/2010   TOTAL THYROIDECTOMY  1991-92   TUBAL LIGATION      HEMATOLOGY/ONCOLOGY HISTORY:  Oncology History  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[place])  08/18/2020 Initial Diagnosis   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[place])   08/18/2020 Cancer Staging   Staging form: Colon and Rectum - Neuroendocine Tumors, AJCC 8th Edition - Clinical stage from 08/18/2020: Stage IV (cTX, cNX, pM1b) - Signed by Lloyd Huger, MD on 08/18/2020 Stage prefix: Initial diagnosis    08/31/2020 - 09/16/2020 Chemotherapy          10/05/2020 -  Chemotherapy   Patient is on Treatment Plan : COLORECTAL FOLFIRI / BEVACIZUMAB Q14D       ALLERGIES:  is allergic to erythromycin.  MEDICATIONS:  Current Outpatient Medications  Medication Sig Dispense Refill   atorvastatin (LIPITOR) 10 MG tablet TAKE 1 TABLET(10 MG) BY MOUTH DAILY 90 tablet 0   clobetasol ointment (TEMOVATE) 0.05 % Apply to affected area every night for 4 weeks, then every other day for 4 weeks and then twice a week for 4 weeks or until resolution. 30 g 5   feeding supplement (ENSURE ENLIVE / ENSURE PLUS) LIQD Take 237 mLs by mouth 3 (three) times daily between meals. 237 mL 12   levothyroxine (SYNTHROID)  150 MCG tablet Take 1 tablet (150 mcg total) by mouth daily. 90 tablet 3   metoprolol succinate (TOPROL-XL) 25 MG 24 hr tablet Take 1 tablet (25 mg total) by mouth daily. 30 tablet 0   nystatin (MYCOSTATIN) 100000 UNIT/ML suspension Take 1 teaspoon and swish in mouth then swallow three times daily 120 mL 0   oxyCODONE-acetaminophen (PERCOCET/ROXICET) 5-325 MG tablet Take 1 tablet by mouth every 4 (four) hours as needed  for severe pain. (Patient not taking: No sig reported) 30 tablet 0   pantoprazole (PROTONIX) 40 MG tablet Take 1 tablet (40 mg total) by mouth 2 (two) times daily. (Patient not taking: No sig reported) 60 tablet 0   potassium chloride 20 MEQ/15ML (10%) SOLN Take 15 mLs (20 mEq total) by mouth 2 (two) times daily for 7 days. (Patient not taking: No sig reported) 210 mL 0   prochlorperazine (COMPAZINE) 10 MG tablet Take 10 mg by mouth 2 (two) times daily as needed for nausea or vomiting.     XARELTO 20 MG TABS tablet TAKE 1 TABLET BY MOUTH DAILY WITH SUPPER. START ON FINAL FULL DAY OF LOVENOX INJECTIONS. 30 tablet 3   No current facility-administered medications for this visit.    VITAL SIGNS: There were no vitals taken for this visit. There were no vitals filed for this visit.  Estimated body mass index is 28.25 kg/m as calculated from the following:   Height as of 01/27/21: $RemoveBefo'5\' 6"'[id number]$  (1.676 m).   Weight as of 02/01/21: 175 lb (79.4 kg).  LABS: CBC:    Component Value Date/Time   WBC 7.0 02/01/2021 0931   HGB 11.3 (L) 02/01/2021 0931   HCT 33.5 (L) 02/01/2021 0931   PLT 384 02/01/2021 0931   PLT 554 (H) 07/30/2020 1449   MCV 88.2 02/01/2021 0931   NEUTROABS 3.4 02/01/2021 0931   LYMPHSABS 2.8 02/01/2021 0931   MONOABS 0.8 02/01/2021 0931   EOSABS 0.0 02/01/2021 0931   BASOSABS 0.0 02/01/2021 0931   Comprehensive Metabolic Panel:    Component Value Date/Time   NA 136 02/01/2021 0931   K 2.5 (LL) 02/01/2021 0931   CL 90 (L) 02/01/2021 0931   CO2 28 02/01/2021 0931   BUN 13 02/01/2021 0931   CREATININE 0.80 02/01/2021 0931   GLUCOSE 80 02/01/2021 0931   CALCIUM 7.9 (L) 02/01/2021 0931   AST 22 02/01/2021 0931   ALT 13 02/01/2021 0931   ALKPHOS 63 02/01/2021 0931   BILITOT 1.6 (H) 02/01/2021 0931   PROT 6.7 02/01/2021 0931   ALBUMIN 3.1 (L) 02/01/2021 0931    RADIOGRAPHIC STUDIES: No results found.  PERFORMANCE STATUS (ECOG) : 4 - Bedbound  Review of Systems Unless  otherwise noted, a complete review of systems is negative.  Physical Exam General: NAD Pulmonary: Unlabored Extremities: no edema, no joint deformities Skin: no rashes Neurological: Weakness but otherwise nonfocal  IMPRESSION: Patient seen in infusion.  I introduced palliative care services and attempted establish therapeutic rapport.  Patient reports that she is doing poorly.  Poor oral intake is minimal, only bites and sips.  She reports having no appetite or desire to eat or drink.  She has progressively declined over the past several weeks and is now mostly in the bed.  She lives at home with her grandson.  Patient does endorse some occasional abdominal pain.  She does not like taking medications.  She also endorses depression.  She did agree to allow me to start mirtazapine.  However, I note that  patient has self discontinued other medications started by Dr. Grayland Ormond.  I met with her friend, Silva Bandy, to discuss goals.  Family is concerned that patient is declining.  They have tried everything possible to get her to eat but have not been successful.  Patient is likely too weak to tolerate further chemotherapy at this point.  We discussed the option of hospice in the event of continued decline.  We also discussed option of hospitalization.  Unclear why patient is clinically declining and scans and tumor markers in September were both improved.  Discussed with Dr. Grayland Ormond and will proceed with CT of the chest, abdomen, and pelvis today.  We will also recheck CEA.  PLAN: -Continue current scope of treatment -Restart dexamethasone -Start mirtazapine 7.5 mg nightly -CT of the chest, abdomen, and pelvis stat -Add on CEA -Referral to home palliative care -We will need to address ACP/CODE STATUS -RTC next week  Case and plan discussed with Dr. Grayland Ormond  Patient expressed understanding and was in agreement with this plan. She also understands that She can call the clinic at any time  with any questions, concerns, or complaints.     Time Total: 45 minutes  Visit consisted of counseling and education dealing with the complex and emotionally intense issues of symptom management and palliative care in the setting of serious and potentially life-threatening illness.Greater than 50%  of this time was spent counseling and coordinating care related to the above assessment and plan.  Signed by: Altha Harm, PhD, NP-C

## 2021-02-09 ENCOUNTER — Other Ambulatory Visit: Payer: Self-pay | Admitting: Family Medicine

## 2021-02-09 ENCOUNTER — Telehealth: Payer: Self-pay | Admitting: Internal Medicine

## 2021-02-09 ENCOUNTER — Telehealth: Payer: Self-pay | Admitting: Student

## 2021-02-09 MED ORDER — NYSTATIN 100000 UNIT/ML MT SUSP: OROMUCOSAL | 0 refills | Status: DC

## 2021-02-09 NOTE — Telephone Encounter (Signed)
Dr. Glori Bickers saw pt once for an acute appt and she asked given cancer treatments can we fill this for pt. If pt wants to be on this med on a regular basis due to cancer treatment will need to go through PCP, will route to PCP and Assistant

## 2021-02-09 NOTE — Telephone Encounter (Signed)
I sent the medication. Pt had said she never received the rx that was sent in 01-18-21.

## 2021-02-09 NOTE — Telephone Encounter (Signed)
Attempted to contact patient at home # to schedule Palliataive Consult, no answer and no voicemail set up.  I then attempted to reach patient on her cell with no answer - left message with reason for call along with my name and call back number, requesting a return call.

## 2021-02-09 NOTE — Telephone Encounter (Signed)
Patient called in and wanted to speak to shannon. Its about medication

## 2021-02-09 NOTE — Telephone Encounter (Signed)
Spoke to [place] although she is not on the pt's DPR. She was with the pt at her visit with Dr Glori Bickers 01-18-21. She was told the pharmacy did not have the rx for the Nystatin. I advised her it was sent and received at 10:16 am.

## 2021-02-14 ENCOUNTER — Telehealth: Payer: Self-pay | Admitting: Student

## 2021-02-14 NOTE — Telephone Encounter (Signed)
Rec'd return call from patient's cousin Budd Palmer and we discussed the Palliative referral/services and both she and patient were in agreement with scheduling visit.  I have scheduled an In-home Consult for 02/17/21 @ 9 AM

## 2021-02-14 NOTE — Progress Notes (Deleted)
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  INTERVAL HISTORY: Patient returns to clinic today for further evaluation and discussion on whether or not to reinitiate treatment.  Her performance status is declining and she continues to have a poor appetite with nausea.  She has increased weakness and fatigue. She has no neurologic complaints.  She denies any fevers.  She denies any chest pain, shortness of breath, cough, hemoptysis.  She denies any vomiting, constipation, or diarrhea.  She has no melena or hematochezia.  She has no urinary complaints.  She has no further leg pain.  Patient feels generally terrible, but offers no further specific complaints.  REVIEW OF SYSTEMS:   Review of Systems  Constitutional:  Positive for malaise/fatigue and weight loss. Negative for fever.  Respiratory: Negative.  Negative for cough, hemoptysis and shortness of breath.   Cardiovascular: Negative.  Negative for chest pain and leg swelling.  Gastrointestinal:  Positive for nausea. Negative for abdominal pain, blood in stool, constipation, diarrhea, melena and vomiting.  Genitourinary: Negative.  Negative for dysuria.  Musculoskeletal: Negative.  Negative for back pain.  Skin: Negative.  Negative for rash.  Neurological:  Positive for weakness. Negative for dizziness, focal weakness and headaches.  Psychiatric/Behavioral: Negative.  The patient is not nervous/anxious.    As per HPI. Otherwise, a complete review of systems is negative.  PAST MEDICAL HISTORY: Past  Medical History:  Diagnosis Date   Arthritis    Chicken pox    Helicobacter pylori gastritis    Hyperlipidemia    Hypertension    Metastatic colon cancer in female [facility])    Obesity    Sleep apnea    Thyroid disease     PAST SURGICAL HISTORY: Past Surgical History:  Procedure Laterality Date   ABDOMINAL HYSTERECTOMY  12/2009   total   COLONOSCOPY WITH PROPOFOL N/A 08/13/2020   Procedure: COLONOSCOPY WITH PROPOFOL;  Surgeon: Jonathon Bellows, MD;  Location: [facility] ENDOSCOPY;  Service: Gastroenterology;  Laterality: N/A;   ESOPHAGOGASTRODUODENOSCOPY (EGD) WITH PROPOFOL N/A 08/13/2020   Procedure: ESOPHAGOGASTRODUODENOSCOPY (EGD) WITH PROPOFOL;  Surgeon: Jonathon Bellows, MD;  Location: [facility] ENDOSCOPY;  Service: Gastroenterology;  Laterality: N/A;   IR IMAGING GUIDED PORT INSERTION  08/27/2020   supracervical abdominal hysterectomy and bilateral salpingo--oophorectomy 01-17-2010 for fibroids  01/17/2010   TOTAL THYROIDECTOMY  1991-92   TUBAL LIGATION      FAMILY HISTORY: Family History  Problem Relation Age of Onset   Stroke Father    Diabetes Maternal Grandmother    Hypertension Maternal Grandmother    Diabetes Maternal Uncle    Cancer Maternal Aunt        Breast    ADVANCED DIRECTIVES (Y/N):  N  HEALTH MAINTENANCE: Social History   Tobacco Use   Smoking status: Never   Smokeless tobacco: Never  Vaping Use   Vaping Use: Never used  Substance Use Topics   Alcohol use: No    Alcohol/week: 0.0 standard drinks   Drug use: No     Colonoscopy:  PAP:  Bone density:  Lipid panel:  Allergies  Allergen Reactions   Erythromycin Itching    Current  Outpatient Medications  Medication Sig Dispense Refill   atorvastatin (LIPITOR) 10 MG tablet TAKE 1 TABLET(10 MG) BY MOUTH DAILY 90 tablet 0   clobetasol ointment (TEMOVATE) 0.05 % Apply to affected area every night for 4 weeks, then every other day for 4 weeks and then twice a week for 4 weeks or until resolution. 30 g 5    dexamethasone (DECADRON) 2 MG tablet Take 1 tablet (2 mg total) by mouth daily. 14 tablet 0   feeding supplement (ENSURE ENLIVE / ENSURE PLUS) LIQD Take 237 mLs by mouth 3 (three) times daily between meals. 237 mL 12   levothyroxine (SYNTHROID) 150 MCG tablet Take 1 tablet (150 mcg total) by mouth daily. 90 tablet 3   metoprolol succinate (TOPROL-XL) 25 MG 24 hr tablet Take 1 tablet (25 mg total) by mouth daily. 30 tablet 0   mirtazapine (REMERON) 7.5 MG tablet Take 1 tablet (7.5 mg total) by mouth at bedtime. 30 tablet 0   nystatin (MYCOSTATIN) 100000 UNIT/ML suspension Take 1 teaspoon and swish in mouth then swallow three times daily 120 mL 0   oxyCODONE-acetaminophen (PERCOCET/ROXICET) 5-325 MG tablet Take 1 tablet by mouth every 4 (four) hours as needed for severe pain. (Patient not taking: No sig reported) 30 tablet 0   pantoprazole (PROTONIX) 40 MG tablet Take 1 tablet (40 mg total) by mouth 2 (two) times daily. (Patient not taking: No sig reported) 60 tablet 0   potassium chloride 20 MEQ/15ML (10%) SOLN Take 15 mLs (20 mEq total) by mouth 2 (two) times daily for 7 days. (Patient not taking: No sig reported) 210 mL 0   prochlorperazine (COMPAZINE) 10 MG tablet Take 10 mg by mouth 2 (two) times daily as needed for nausea or vomiting.     XARELTO 20 MG TABS tablet TAKE 1 TABLET BY MOUTH DAILY WITH SUPPER. START ON FINAL FULL DAY OF LOVENOX INJECTIONS. 30 tablet 3   No current facility-administered medications for this visit.    OBJECTIVE: There were no vitals filed for this visit.     There is no height or weight on file to calculate BMI.    ECOG FS:2 - Symptomatic, <50% confined to bed  General: Ill-appearing, no acute distress.  Sitting in a wheelchair. Eyes: Pink conjunctiva, anicteric sclera. HEENT: Normocephalic, moist mucous membranes. Lungs: No audible wheezing or coughing. Heart: Regular rate and rhythm. Abdomen: Soft, nontender, no obvious distention. Musculoskeletal: No edema,  cyanosis, or clubbing. Neuro: Alert, answering all questions appropriately. Cranial nerves grossly intact. Skin: No rashes or petechiae noted. Psych: Normal affect.   LAB RESULTS:  Lab Results  Component Value Date   NA 140 02/08/2021   K 2.5 (LL) 02/08/2021   CL 94 (L) 02/08/2021   CO2 30 02/08/2021   GLUCOSE 111 (H) 02/08/2021   BUN 14 02/08/2021   CREATININE 0.83 02/08/2021   CALCIUM 8.1 (L) 02/08/2021   PROT 6.6 02/08/2021   ALBUMIN 3.1 (L) 02/08/2021   AST 24 02/08/2021   ALT 12 02/08/2021   ALKPHOS 57 02/08/2021   BILITOT 1.4 (H) 02/08/2021   GFRNONAA >60 02/08/2021   GFRAA  01/10/2010    >60        The eGFR has been calculated using the MDRD equation. This calculation has not been validated in all clinical situations. eGFR's persistently <60 mL/min signify possible Chronic Kidney Disease.    Lab Results  Component Value Date   WBC 7.9 02/08/2021   NEUTROABS 5.0 02/08/2021   HGB 11.8 (L) 02/08/2021  HCT 36.4 02/08/2021   MCV 90.1 02/08/2021   PLT 347 02/08/2021     STUDIES: CT CHEST ABDOMEN PELVIS W CONTRAST  Result Date: 02/08/2021 CLINICAL DATA:  Stage IV colon adenocarcinoma with peritoneal carcinomatosis diagnosed in June 2022. Pulmonary embolism on Xarelto. Abdominal pain and clinical decline. EXAM: CT CHEST, ABDOMEN, AND PELVIS WITH CONTRAST TECHNIQUE: Multidetector CT imaging of the chest, abdomen and pelvis was performed following the standard protocol during bolus administration of intravenous contrast. CONTRAST:  158m OMNIPAQUE IOHEXOL 300 MG/ML  SOLN COMPARISON:  Prior CTs 11/23/2020 FINDINGS: CT CHEST FINDINGS Cardiovascular: Study was not performed as a dedicated CTA, although there is adequate opacification of the pulmonary arteries, and no recurrent pulmonary emboli are demonstrated. Mild atherosclerosis of the aorta and coronary arteries. A right IJ Port-A-Cath extends to the superior cavoatrial junction. The heart size is normal. There is  no pericardial effusion. Mediastinum/Nodes: There are no enlarged mediastinal, hilar or axillary lymph nodes. Suspected previous thyroidectomy. The trachea and esophagus appear unremarkable. Lungs/Pleura: Enlarging bilateral pleural effusions, now moderate. No definite pleural base masses or abnormal enhancement. There is minimal nodularity along the right major fissure which is similar to previous study. There is compressive atelectasis dependently in both lungs. No suspicious pulmonary nodules. Musculoskeletal/Chest wall: Grossly stable bilateral breast masses, without significant hypermetabolic activity on PET-CT 08/25/2020. No other chest wall lesion or suspicious osseous findings. CT ABDOMEN AND PELVIS FINDINGS Hepatobiliary: The liver is normal in density without suspicious focal abnormality. No evidence of gallstones, gallbladder wall thickening or biliary dilatation. Pancreas: Unremarkable. No pancreatic ductal dilatation or surrounding inflammatory changes. Spleen: Normal in size without focal abnormality. Adrenals/Urinary Tract: Both adrenal glands appear normal. Stable small cyst in the interpolar region of the right kidney. No evidence of enhancing renal mass, urinary tract calculus or hydronephrosis. The bladder appears unremarkable for its degree of distention. Stomach/Bowel: Enteric contrast was administered and has passed into the distal colon. The stomach appears unremarkable for its degree of distention. Mild wall thickening of the ascending and transverse colon appears improved. No small bowel wall thickening or distention. Vascular/Lymphatic: There are no enlarged abdominal or pelvic lymph nodes. Aortic and branch vessel atherosclerosis without acute vascular findings. The portal, superior mesenteric and splenic veins are patent. Reproductive: The uterus and adnexal regions appear unchanged. Probable pelvic floor laxity. Other: Overall volume of ascites is stable to minimally increased from the  previous study. Nodularity previously seen within the omentum has improved, with a 9 mm nodule in the gastrocolic ligament on image 73/2 (previously 13 mm). No new or enlarging nodules are identified. Musculoskeletal: No acute or significant osseous findings. Lumbar spine facet arthropathy. IMPRESSION: 1. Further improvement in previously demonstrated omental nodularity consistent with improving carcinomatosis. Overall volume of abdominal ascites is stable to slightly increased. 2. Enlarging moderate-sized bilateral pleural effusions without definite pleural base nodularity. 3. No evidence of solid visceral organ, pulmonary or osseous metastatic disease. 4. Stable bilateral breast nodules. Electronically Signed   By: WRichardean SaleM.D.   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  PLAN:    1.  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  Pretreatment CEA was 313, which is now improved to 21.6.  Patient completed cycle 2 of FOLFOX plus Avastin, but then  had a delayed reaction possibly secondary to oxaliplatin.  Oxaliplatin has been discontinued.  Patient received cycle 8 of FOLFIRI plus Avastin on December 22, 2020.  She also receives Ziextenzo with pump removal.  Patient continues to have a decreased performance status, therefore will continue to hold treatment for another 2 weeks.  Proceed with IV fluids today.  Patient return to clinic in 1 week for additional IV fluids and then in 2 weeks for further evaluation, repeat imaging, and consideration of reinitiation of treatment.  We will also consult palliative care. 2.  Abdominal pain: Resolved  Continue Percocet as needed. 3.  Renal insufficiency: Resolved. 4.  Hypokalemia: Potassium  level significantly worse.  Patient will receive 40 mill equivalents of IV potassium today and return to clinic in 1 week for additional treatment if necessary. 5.  Nausea: Patient states dexamethasone does not help.  Continue Zofran as needed.  Patient also received IV steroids and Zofran today along with her IV fluids.   6.  Poor appetite: Chronic and unchanged.  Dexamethasone as above.  She declined additional evaluation from dietary. 7.  Neutropenia: Resolved.  Proceed with treatment and continue Ziextenzo with pump removal for subsequent treatments. 8.  PE/DVT: Patient noted to have new onset symptoms in her right lower extremity which revealed a large DVT while taking Eliquis. She was considered an Eliquis failure and subsequently took Lovenox for 1 month, She is now on Xarelto and tolerating treatment well.  9.  Mild, monitor. 10.  Hyperbilirubinemia: Patient's bilirubin has trended up to 1.6.  Repeat imaging as above. 11.  Hypotension: IV fluids as above.   Patient expressed understanding and was in agreement with this plan. She also understands that She can call clinic at any time with any questions, concerns, or complaints.    Cancer Staging  Adenocarcinoma of transverse colon [facility]) Staging form: Colon and Rectum - Neuroendocine Tumors, AJCC 8th Edition - Clinical stage from 08/18/2020: Stage IV (cTX, cNX, pM1b) - Signed by Lloyd Huger, MD on 08/18/2020 Stage prefix: Initial diagnosis   Lloyd Huger, MD   02/14/2021 10:07 AM

## 2021-02-15 ENCOUNTER — Ambulatory Visit: Admission: RE | Admit: 2021-02-15 | Payer: Medicare Other | Source: Ambulatory Visit

## 2021-02-15 ENCOUNTER — Telehealth: Payer: Self-pay | Admitting: Oncology

## 2021-02-15 NOTE — Telephone Encounter (Signed)
Caregiver called in to see when pt had appt. She was under impression that pt was going to  palliative care.And don't know how they will get her here, pt is bed bound. Please give her a call back at [phone]. Her name is Judith Ross

## 2021-02-16 ENCOUNTER — Inpatient Hospital Stay: Payer: Medicare Other | Admitting: Oncology

## 2021-02-16 ENCOUNTER — Emergency Department: Payer: Medicare Other

## 2021-02-16 ENCOUNTER — Inpatient Hospital Stay: Payer: Medicare Other

## 2021-02-16 ENCOUNTER — Telehealth: Payer: Self-pay | Admitting: Hospice and Palliative Medicine

## 2021-02-16 ENCOUNTER — Inpatient Hospital Stay: Payer: Medicare Other | Admitting: Hospice and Palliative Medicine

## 2021-02-16 ENCOUNTER — Encounter: Payer: Self-pay | Admitting: Family Medicine

## 2021-02-16 ENCOUNTER — Other Ambulatory Visit: Payer: Self-pay

## 2021-02-16 ENCOUNTER — Inpatient Hospital Stay
Admission: EM | Admit: 2021-02-16 | Discharge: 2021-02-21 | DRG: 641 | Disposition: A | Discharge status: Hospice | Payer: Medicare Other | Attending: Internal Medicine | Admitting: Internal Medicine

## 2021-02-16 DIAGNOSIS — R627 Adult failure to thrive: Secondary | ICD-10-CM | POA: Diagnosis present

## 2021-02-16 DIAGNOSIS — Z881 Allergy status to other antibiotic agents status: Secondary | ICD-10-CM | POA: Diagnosis not present

## 2021-02-16 DIAGNOSIS — Z86711 Personal history of pulmonary embolism: Secondary | ICD-10-CM | POA: Diagnosis not present

## 2021-02-16 DIAGNOSIS — Z833 Family history of diabetes mellitus: Secondary | ICD-10-CM

## 2021-02-16 DIAGNOSIS — Z515 Encounter for palliative care: Secondary | ICD-10-CM | POA: Diagnosis not present

## 2021-02-16 DIAGNOSIS — C184 Malignant neoplasm of transverse colon: Secondary | ICD-10-CM | POA: Diagnosis present

## 2021-02-16 DIAGNOSIS — E860 Dehydration: Secondary | ICD-10-CM | POA: Diagnosis present

## 2021-02-16 DIAGNOSIS — R17 Unspecified jaundice: Secondary | ICD-10-CM | POA: Diagnosis present

## 2021-02-16 DIAGNOSIS — R7303 Prediabetes: Secondary | ICD-10-CM | POA: Diagnosis present

## 2021-02-16 DIAGNOSIS — Z66 Do not resuscitate: Secondary | ICD-10-CM | POA: Diagnosis present

## 2021-02-16 DIAGNOSIS — Z7401 Bed confinement status: Secondary | ICD-10-CM

## 2021-02-16 DIAGNOSIS — Z8249 Family history of ischemic heart disease and other diseases of the circulatory system: Secondary | ICD-10-CM

## 2021-02-16 DIAGNOSIS — G4733 Obstructive sleep apnea (adult) (pediatric): Secondary | ICD-10-CM | POA: Diagnosis present

## 2021-02-16 DIAGNOSIS — E876 Hypokalemia: Secondary | ICD-10-CM | POA: Diagnosis present

## 2021-02-16 DIAGNOSIS — J90 Pleural effusion, not elsewhere classified: Secondary | ICD-10-CM | POA: Diagnosis present

## 2021-02-16 DIAGNOSIS — R54 Age-related physical debility: Secondary | ICD-10-CM | POA: Diagnosis present

## 2021-02-16 DIAGNOSIS — G4730 Sleep apnea, unspecified: Secondary | ICD-10-CM | POA: Diagnosis present

## 2021-02-16 DIAGNOSIS — K5900 Constipation, unspecified: Secondary | ICD-10-CM

## 2021-02-16 DIAGNOSIS — Z20822 Contact with and (suspected) exposure to covid-19: Secondary | ICD-10-CM | POA: Diagnosis present

## 2021-02-16 DIAGNOSIS — Z23 Encounter for immunization: Secondary | ICD-10-CM

## 2021-02-16 DIAGNOSIS — E890 Postprocedural hypothyroidism: Secondary | ICD-10-CM | POA: Diagnosis not present

## 2021-02-16 DIAGNOSIS — D329 Benign neoplasm of meninges, unspecified: Secondary | ICD-10-CM | POA: Diagnosis present

## 2021-02-16 DIAGNOSIS — Z803 Family history of malignant neoplasm of breast: Secondary | ICD-10-CM

## 2021-02-16 DIAGNOSIS — Z90710 Acquired absence of both cervix and uterus: Secondary | ICD-10-CM

## 2021-02-16 DIAGNOSIS — R531 Weakness: Secondary | ICD-10-CM

## 2021-02-16 DIAGNOSIS — T451X5A Adverse effect of antineoplastic and immunosuppressive drugs, initial encounter: Secondary | ICD-10-CM | POA: Diagnosis present

## 2021-02-16 DIAGNOSIS — Z6825 Body mass index (BMI) 25.0-25.9, adult: Secondary | ICD-10-CM

## 2021-02-16 DIAGNOSIS — D701 Agranulocytosis secondary to cancer chemotherapy: Secondary | ICD-10-CM | POA: Diagnosis present

## 2021-02-16 DIAGNOSIS — Z7901 Long term (current) use of anticoagulants: Secondary | ICD-10-CM

## 2021-02-16 DIAGNOSIS — I10 Essential (primary) hypertension: Secondary | ICD-10-CM | POA: Diagnosis present

## 2021-02-16 DIAGNOSIS — C786 Secondary malignant neoplasm of retroperitoneum and peritoneum: Secondary | ICD-10-CM | POA: Diagnosis present

## 2021-02-16 DIAGNOSIS — J9811 Atelectasis: Secondary | ICD-10-CM | POA: Diagnosis present

## 2021-02-16 DIAGNOSIS — I2699 Other pulmonary embolism without acute cor pulmonale: Secondary | ICD-10-CM | POA: Diagnosis present

## 2021-02-16 DIAGNOSIS — C189 Malignant neoplasm of colon, unspecified: Secondary | ICD-10-CM | POA: Diagnosis not present

## 2021-02-16 DIAGNOSIS — Z793 Long term (current) use of hormonal contraceptives: Secondary | ICD-10-CM

## 2021-02-16 DIAGNOSIS — E785 Hyperlipidemia, unspecified: Secondary | ICD-10-CM | POA: Diagnosis present

## 2021-02-16 DIAGNOSIS — Z823 Family history of stroke: Secondary | ICD-10-CM

## 2021-02-16 DIAGNOSIS — I4891 Unspecified atrial fibrillation: Secondary | ICD-10-CM | POA: Diagnosis present

## 2021-02-16 DIAGNOSIS — E039 Hypothyroidism, unspecified: Secondary | ICD-10-CM | POA: Diagnosis present

## 2021-02-16 DIAGNOSIS — Z79899 Other long term (current) drug therapy: Secondary | ICD-10-CM

## 2021-02-16 LAB — CBC
HCT: 34.1 % — ABNORMAL LOW (ref 36.0–46.0)
HCT: 33.6 % — ABNORMAL LOW (ref 36.0–46.0)
Hemoglobin: 11.1 g/dL — ABNORMAL LOW (ref 12.0–15.0)
Hemoglobin: 10.9 g/dL — ABNORMAL LOW (ref 12.0–15.0)
MCH: 29.8 pg (ref 26.0–34.0)
MCH: 29.6 pg (ref 26.0–34.0)
MCHC: 32.6 g/dL (ref 30.0–36.0)
MCHC: 32.4 g/dL (ref 30.0–36.0)
MCV: 91.7 fL (ref 80.0–100.0)
MCV: 91.3 fL (ref 80.0–100.0)
Platelets: 269 10*3/uL (ref 150–400)
Platelets: 258 10*3/uL (ref 150–400)
RBC: 3.72 MIL/uL — ABNORMAL LOW (ref 3.87–5.11)
RBC: 3.68 MIL/uL — ABNORMAL LOW (ref 3.87–5.11)
RDW: 22.3 % — ABNORMAL HIGH (ref 11.5–15.5)
RDW: 22.2 % — ABNORMAL HIGH (ref 11.5–15.5)
WBC: 8.2 10*3/uL (ref 4.0–10.5)
WBC: 7.1 10*3/uL (ref 4.0–10.5)
nRBC: 0.4 % — ABNORMAL HIGH (ref 0.0–0.2)
nRBC: 0.6 % — ABNORMAL HIGH (ref 0.0–0.2)

## 2021-02-16 LAB — BASIC METABOLIC PANEL
Anion gap: 14 (ref 5–15)
Anion gap: 13 (ref 5–15)
BUN: 13 mg/dL (ref 8–23)
BUN: 13 mg/dL (ref 8–23)
CO2: 30 mmol/L (ref 22–32)
CO2: 30 mmol/L (ref 22–32)
Calcium: 7.6 mg/dL — ABNORMAL LOW (ref 8.9–10.3)
Calcium: 7.6 mg/dL — ABNORMAL LOW (ref 8.9–10.3)
Chloride: 99 mmol/L (ref 98–111)
Chloride: 98 mmol/L (ref 98–111)
Creatinine, Ser: 0.74 mg/dL (ref 0.44–1.00)
Creatinine, Ser: 0.75 mg/dL (ref 0.44–1.00)
GFR, Estimated: >60 mL/min (ref >60)
GFR, Estimated: >60 mL/min (ref >60)
Glucose, Bld: 93 mg/dL (ref 70–99)
Glucose, Bld: 83 mg/dL (ref 70–99)
Potassium: 2 mmol/L — CL (ref 3.5–5.1)
Potassium: 2.5 mmol/L — CL (ref 3.5–5.1)
Sodium: 143 mmol/L (ref 135–145)
Sodium: 141 mmol/L (ref 135–145)

## 2021-02-16 LAB — HEPATIC FUNCTION PANEL
ALT: 11 U/L (ref 0–44)
AST: 19 U/L (ref 15–41)
Albumin: 2.8 g/dL — ABNORMAL LOW (ref 3.5–5.0)
Alkaline Phosphatase: 52 U/L (ref 38–126)
Bilirubin, Direct: 0.5 mg/dL — ABNORMAL HIGH (ref 0.0–0.2)
Indirect Bilirubin: 1.8 mg/dL — ABNORMAL HIGH (ref 0.3–0.9)
Total Bilirubin: 2.3 mg/dL — ABNORMAL HIGH (ref 0.3–1.2)
Total Protein: 5.9 g/dL — ABNORMAL LOW (ref 6.5–8.1)

## 2021-02-16 LAB — URINALYSIS, COMPLETE (UACMP) WITH MICROSCOPIC
Glucose, UA: NEGATIVE mg/dL
Ketones, ur: 40 mg/dL — AB
Nitrite: POSITIVE — AB
Protein, ur: 100 mg/dL — AB
Specific Gravity, Urine: 1.025 (ref 1.005–1.030)
Squamous Epithelial / HPF: NONE SEEN (ref 0–5)
WBC, UA: >50 WBC/hpf (ref 0–5)
pH: 6 (ref 5.0–8.0)

## 2021-02-16 LAB — RESP PANEL BY RT-PCR (FLU A&B, COVID) ARPGX2
Influenza A by PCR: NEGATIVE
Influenza B by PCR: NEGATIVE
SARS Coronavirus 2 by RT PCR: NEGATIVE

## 2021-02-16 LAB — TROPONIN I (HIGH SENSITIVITY): Troponin I (High Sensitivity): 10 ng/L (ref <18)

## 2021-02-16 LAB — MAGNESIUM: Magnesium: 2.1 mg/dL (ref 1.7–2.4)

## 2021-02-16 MED ORDER — MIRTAZAPINE 15 MG PO TABS
7.5000 mg | ORAL_TABLET | Freq: Every day | ORAL | Status: DC
  Administered 2021-02-16 – 2021-02-20 (×5): 7.5 mg via ORAL
  Filled 2021-02-16 (×5): qty 1

## 2021-02-16 MED ORDER — METOPROLOL SUCCINATE ER 25 MG PO TB24
25.0000 mg | ORAL_TABLET | Freq: Every day | ORAL | Status: DC
  Administered 2021-02-16 – 2021-02-18 (×3): 25 mg via ORAL
  Filled 2021-02-16 (×3): qty 1

## 2021-02-16 MED ORDER — DEXAMETHASONE 4 MG PO TABS
2.0000 mg | ORAL_TABLET | Freq: Every day | ORAL | Status: DC
  Administered 2021-02-17 – 2021-02-18 (×3): 2 mg via ORAL
  Filled 2021-02-16 (×2): qty 4
  Filled 2021-02-16: qty 1

## 2021-02-16 MED ORDER — ONDANSETRON HCL 4 MG PO TABS: 4.0000 mg | ORAL_TABLET | Freq: Four times a day (QID) | ORAL | Status: DC | PRN

## 2021-02-16 MED ORDER — PANTOPRAZOLE SODIUM 40 MG PO TBEC
40.0000 mg | DELAYED_RELEASE_TABLET | Freq: Two times a day (BID) | ORAL | Status: DC
  Administered 2021-02-16 – 2021-02-18 (×3): 40 mg via ORAL
  Filled 2021-02-16 (×4): qty 1

## 2021-02-16 MED ORDER — MAGNESIUM SULFATE 2 GM/50ML IV SOLN
2.0000 g | Freq: Once | INTRAVENOUS | Status: AC
  Administered 2021-02-16: 2 g via INTRAVENOUS
  Filled 2021-02-16: qty 50

## 2021-02-16 MED ORDER — POTASSIUM CHLORIDE 10 MEQ/100ML IV SOLN
10.0000 meq | INTRAVENOUS | Status: AC
  Administered 2021-02-16 (×4): 10 meq via INTRAVENOUS
  Filled 2021-02-16 (×4): qty 100

## 2021-02-16 MED ORDER — DOCUSATE SODIUM 100 MG PO CAPS
100.0000 mg | ORAL_CAPSULE | Freq: Two times a day (BID) | ORAL | Status: DC
  Administered 2021-02-16 – 2021-02-18 (×4): 100 mg via ORAL
  Filled 2021-02-16 (×4): qty 1

## 2021-02-16 MED ORDER — ONDANSETRON HCL 4 MG/2ML IJ SOLN: 4.0000 mg | Freq: Four times a day (QID) | INTRAMUSCULAR | Status: DC | PRN

## 2021-02-16 MED ORDER — PROCHLORPERAZINE MALEATE 10 MG PO TABS
10.0000 mg | ORAL_TABLET | Freq: Two times a day (BID) | ORAL | Status: DC | PRN
  Filled 2021-02-16: qty 1

## 2021-02-16 MED ORDER — LACTATED RINGERS IV BOLUS
1000.0000 mL | Freq: Once | INTRAVENOUS | Status: AC
  Administered 2021-02-16: 1000 mL via INTRAVENOUS

## 2021-02-16 MED ORDER — RIVAROXABAN 20 MG PO TABS
20.0000 mg | ORAL_TABLET | Freq: Every day | ORAL | Status: DC
  Administered 2021-02-17 – 2021-02-18 (×3): 20 mg via ORAL
  Filled 2021-02-16 (×3): qty 1

## 2021-02-16 MED ORDER — ACETAMINOPHEN 325 MG PO TABS: 650.0000 mg | ORAL_TABLET | Freq: Four times a day (QID) | ORAL | Status: DC | PRN

## 2021-02-16 MED ORDER — SODIUM CHLORIDE 0.9 % IV SOLN: INTRAVENOUS | Status: AC

## 2021-02-16 MED ORDER — LEVOTHYROXINE SODIUM 50 MCG PO TABS
150.0000 ug | ORAL_TABLET | Freq: Every day | ORAL | Status: DC
  Administered 2021-02-17 – 2021-02-20 (×4): 150 ug via ORAL
  Filled 2021-02-16 (×2): qty 1
  Filled 2021-02-16: qty 3
  Filled 2021-02-16: qty 1

## 2021-02-16 MED ORDER — ENSURE ENLIVE PO LIQD: 237.0000 mL | Freq: Three times a day (TID) | ORAL | Status: DC

## 2021-02-16 MED ORDER — ONDANSETRON HCL 4 MG/2ML IJ SOLN
4.0000 mg | Freq: Once | INTRAMUSCULAR | Status: AC
  Administered 2021-02-16: 4 mg via INTRAVENOUS
  Filled 2021-02-16: qty 2

## 2021-02-16 MED ORDER — ACETAMINOPHEN 650 MG RE SUPP: 650.0000 mg | Freq: Four times a day (QID) | RECTAL | Status: DC | PRN

## 2021-02-16 MED ORDER — SODIUM CHLORIDE 0.9 % IV SOLN
1.0000 g | INTRAVENOUS | Status: DC
  Administered 2021-02-16 – 2021-02-17 (×2): 1 g via INTRAVENOUS
  Filled 2021-02-16: qty 1
  Filled 2021-02-16 (×3): qty 10

## 2021-02-16 NOTE — Telephone Encounter (Signed)
Patient was scheduled to see Dr. Grayland Ormond and me today but canceled the appointment.  I called and spoke with patient's caregiver, Judith Ross.  She reports that patient is doing poorly at home.  Patient has refused to eat or drink for several days.  She is now essentially bedbound.  Unclear why patient is failing to thrive over these past weeks.  I discussed option of sending her to the ER for further evaluation.  We also discussed the option of hospice involvement if patient would prefer to stay at home and be comfortable.  Without intervention, patient appears to be declining and would likely ultimately succumb. [name] plans to speak with patient and other family and will let us know what they decide.

## 2021-02-16 NOTE — H&P (Addendum)
History and Physical    [name] TGG:[id number] DOB: October 27, 1955 DOA: 02/16/2021  PCP: Venia Carbon, MD   Patient coming from: Home  I have personally briefly reviewed patient's old medical records in [place]  Chief Complaint: Generalized weakness/poor p.o. intake.  HPI: Judith Ross is a 65 y.o. female with PMH significant for metastatic colon cancer, with peritoneal carcinomatosis,  she has completed multiple cycles of chemotherapy and currently is off chemotherapy.  A. fib, history of PE on Xarelto, obesity, hyperlipidemia, hypertension, sleep apnea presenting to the ED with generalized weakness, fatigue and inability to ambulate.  Patient reports she has been feeling very weak for last 2 weeks, She is unable to ambulate, get up and walk. She reports having decreased appetite, she has not eaten anything in the last 2 weeks.  She denies any shortness of breath, chest pain, denies any abdominal pain,  diarrhea.  Patient was supposed to follow-up with oncologist today but she was unable to get up so she was advised to come back to the ED.  ED Course: She is hemodynamically stable. HR 85, RR 14, BP 126/76, SPO2 94%, temp 98.6 . Labs include sodium 143, potassium 2.0, chloride 99, bicarb 30, glucose 93, BUN 13, creatinine 0.74, calcium 7.6, magnesium 1.8, alkaline phosphatase 57, albumin 2.8, AST 19, ALT 11, total protein 5.9, direct indirect bilirubin 1.8, troponin 10, influenza negative, COVID-negative, UA large leukocytes, positive nitrites many bacteria, WBC 7.8, hemoglobin 10.9, hematocrit 33.6, platelet 258 X-ray abdomen: Nonobstructive bowel gas pattern. Layering left pleural effusion. X-ray chest; Atelectasis with probable layering bilateral pleural effusions.  Review of Systems:  Review of Systems  Constitutional:  Positive for malaise/fatigue.  HENT: Negative.    Eyes: Negative.   Respiratory: Negative.    Cardiovascular: Negative.   Gastrointestinal:  Negative.   Genitourinary: Negative.   Musculoskeletal: Negative.   Skin: Negative.   Neurological:  Positive for weakness.  Endo/Heme/Allergies: Negative.   Psychiatric/Behavioral: Negative.      Past Medical History:  Diagnosis Date   Arthritis    Chicken pox    Helicobacter pylori gastritis    Hyperlipidemia    Hypertension    Metastatic colon cancer in female [facility])    Obesity    Sleep apnea    Thyroid disease     Past Surgical History:  Procedure Laterality Date   ABDOMINAL HYSTERECTOMY  12/2009   total   COLONOSCOPY WITH PROPOFOL N/A 08/13/2020   Procedure: COLONOSCOPY WITH PROPOFOL;  Surgeon: Jonathon Bellows, MD;  Location: [facility] ENDOSCOPY;  Service: Gastroenterology;  Laterality: N/A;   ESOPHAGOGASTRODUODENOSCOPY (EGD) WITH PROPOFOL N/A 08/13/2020   Procedure: ESOPHAGOGASTRODUODENOSCOPY (EGD) WITH PROPOFOL;  Surgeon: Jonathon Bellows, MD;  Location: [facility] ENDOSCOPY;  Service: Gastroenterology;  Laterality: N/A;   IR IMAGING GUIDED PORT INSERTION  08/27/2020   supracervical abdominal hysterectomy and bilateral salpingo--oophorectomy 01-17-2010 for fibroids  01/17/2010   TOTAL THYROIDECTOMY  1991-92   TUBAL LIGATION       reports that she has never smoked. She has never used smokeless tobacco. She reports that she does not drink alcohol and does not use drugs.  Allergies  Allergen Reactions   Erythromycin Itching    Family History  Problem Relation Age of Onset   Stroke Father    Diabetes Maternal Grandmother    Hypertension Maternal Grandmother    Diabetes Maternal Uncle    Cancer Maternal Aunt        Breast   Family history reviewed and not pertinent .  Prior to Admission medications   Medication Sig Start Date End Date Taking? Authorizing Provider  atorvastatin (LIPITOR) 10 MG tablet TAKE 1 TABLET(10 MG) BY MOUTH DAILY 07/30/20   Dutch Quint B, FNP  clobetasol ointment (TEMOVATE) 0.05 % Apply to affected area every night for 4 weeks, then every other day for 4 weeks  and then twice a week for 4 weeks or until resolution. 05/14/17   [name], MD  dexamethasone (DECADRON) 2 MG tablet Take 1 tablet (2 mg total) by mouth daily. 02/08/21   Borders, Kirt Boys, NP  feeding supplement (ENSURE ENLIVE / ENSURE PLUS) LIQD Take 237 mLs by mouth 3 (three) times daily between meals. 09/22/20   Nolberto Hanlon, MD  levothyroxine (SYNTHROID) 150 MCG tablet Take 1 tablet (150 mcg total) by mouth daily. 08/18/20   Venia Carbon, MD  metoprolol succinate (TOPROL-XL) 25 MG 24 hr tablet Take 1 tablet (25 mg total) by mouth daily. 01/12/21 02/11/21  Wyvonnia Dusky, MD  mirtazapine (REMERON) 7.5 MG tablet Take 1 tablet (7.5 mg total) by mouth at bedtime. 02/08/21   Borders, Kirt Boys, NP  nystatin (MYCOSTATIN) 100000 UNIT/ML suspension Take 1 teaspoon and swish in mouth then swallow three times daily 02/09/21   Venia Carbon, MD  oxyCODONE-acetaminophen (PERCOCET/ROXICET) 5-325 MG tablet Take 1 tablet by mouth every 4 (four) hours as needed for severe pain. Patient not taking: No sig reported 08/18/20   Lloyd Huger, MD  pantoprazole (PROTONIX) 40 MG tablet Take 1 tablet (40 mg total) by mouth 2 (two) times daily. Patient not taking: No sig reported 09/22/20 10/22/20  Nolberto Hanlon, MD  potassium chloride 20 MEQ/15ML (10%) SOLN Take 15 mLs (20 mEq total) by mouth 2 (two) times daily for 7 days. Patient not taking: No sig reported 01/11/21 01/18/21  Wyvonnia Dusky, MD  prochlorperazine (COMPAZINE) 10 MG tablet Take 10 mg by mouth 2 (two) times daily as needed for nausea or vomiting.    [provider]  XARELTO 20 MG TABS tablet TAKE 1 TABLET BY MOUTH DAILY WITH SUPPER. START ON FINAL FULL DAY OF LOVENOX INJECTIONS. 12/02/20   Lloyd Huger, MD    Physical Exam: Vitals:   02/16/21 1135 02/16/21 1630 02/16/21 1700 02/16/21 1900  BP: 129/82 118/69 126/76 (!) 156/92  Pulse: 97 88 85 87  Resp: 18 19 14 16   Temp: 98.6 F (37 C)     TempSrc: Oral      SpO2: 95% 96% 94% 97%  Weight: 72.6 kg     Height: 5\' 6"  (1.676 m)       Constitutional: Appears chronically ill looking, deconditioned, bald with some alopecia Vitals:   02/16/21 1135 02/16/21 1630 02/16/21 1700 02/16/21 1900  BP: 129/82 118/69 126/76 (!) 156/92  Pulse: 97 88 85 87  Resp: 18 19 14 16   Temp: 98.6 F (37 C)     TempSrc: Oral     SpO2: 95% 96% 94% 97%  Weight: 72.6 kg     Height: 5\' 6"  (1.676 m)      Eyes: PERRL, lids and conjunctivae normal ENMT: Mucous membranes are moist. Posterior pharynx without exudate.  Normal dentition.  Neck: normal, supple, no masses, no thyromegaly Respiratory: Clear to auscultation bilaterally, respiratory effort normal, no sensory muscle use.  Chemo-Port noted. Cardiovascular: S1-S2 heard, regular rate and rhythm, no murmur. Abdomen: Soft, mildly tender, nondistended, BS + musculoskeletal: no clubbing / cyanosis. No joint deformity upper and lower extremities. Good ROM, no contractures.  Normal muscle tone.  Skin: no rashes, lesions, ulcers. No induration Neurologic: CN 2-12 grossly intact. Sensation intact, DTR normal. Strength 5/5 in all 4.  Psychiatric: Normal judgment and insight. Alert and oriented x 3. Normal mood.     Labs on Admission: I have personally reviewed following labs and imaging studies  CBC: Recent Labs  Lab 02/16/21 1137 02/16/21 1230  WBC 8.2 7.1  HGB 11.1* 10.9*  HCT 34.1* 33.6*  MCV 91.7 91.3  PLT 269 478   Basic Metabolic Panel: Recent Labs  Lab 02/16/21 1230 02/16/21 1518  NA 143  --   K 2.0*  --   CL 99  --   CO2 30  --   GLUCOSE 93  --   BUN 13  --   CREATININE 0.74  --   CALCIUM 7.6*  --   MG  --  2.1   GFR: Estimated Creatinine Clearance: 70.5 mL/min (by C-G formula based on SCr of 0.74 mg/dL). Liver Function Tests: Recent Labs  Lab 02/16/21 1600  AST 19  ALT 11  ALKPHOS 52  BILITOT 2.3*  PROT 5.9*  ALBUMIN 2.8*   No results for input(s): LIPASE, AMYLASE in the last 168  hours. No results for input(s): AMMONIA in the last 168 hours. Coagulation Profile: No results for input(s): INR, PROTIME in the last 168 hours. Cardiac Enzymes: No results for input(s): CKTOTAL, CKMB, CKMBINDEX, TROPONINI in the last 168 hours. BNP (last 3 results) No results for input(s): PROBNP in the last 8760 hours. HbA1C: No results for input(s): HGBA1C in the last 72 hours. CBG: No results for input(s): GLUCAP in the last 168 hours. Lipid Profile: No results for input(s): CHOL, HDL, LDLCALC, TRIG, CHOLHDL, LDLDIRECT in the last 72 hours. Thyroid Function Tests: No results for input(s): TSH, T4TOTAL, FREET4, T3FREE, THYROIDAB in the last 72 hours. Anemia Panel: No results for input(s): VITAMINB12, FOLATE, FERRITIN, TIBC, IRON, RETICCTPCT in the last 72 hours. Urine analysis:    Component Value Date/Time   COLORURINE AMBER (A) 02/16/2021 1611   APPEARANCEUR CLOUDY (A) 02/16/2021 1611   LABSPEC 1.025 02/16/2021 1611   PHURINE 6.0 02/16/2021 1611   GLUCOSEU NEGATIVE 02/16/2021 1611   HGBUR TRACE (A) 02/16/2021 1611   BILIRUBINUR LARGE (A) 02/16/2021 1611   BILIRUBINUR trace 07/14/2020 1141   KETONESUR 40 (A) 02/16/2021 1611   PROTEINUR 100 (A) 02/16/2021 1611   UROBILINOGEN 0.2 07/14/2020 1141   UROBILINOGEN 1.0 08/03/2011 0856   NITRITE POSITIVE (A) 02/16/2021 1611   LEUKOCYTESUR LARGE (A) 02/16/2021 1611    Radiological Exams on Admission: DG Chest Portable 1 View  Result Date: 02/16/2021 CLINICAL DATA:  Shortness of breath. EXAM: PORTABLE CHEST 1 VIEW COMPARISON:  Chest x-ray dated September 22, 2020 FINDINGS: Right chest wall port is unchanged in position. Cardiac and mediastinal contours are unchanged and within normal limits for AP technique. Hazy opacities of the bilateral lower lungs, likely due to layering pleural effusions. Additional bilateral linear opacities are seen which are likely due to atelectasis. No evidence of pneumothorax. IMPRESSION: Atelectasis with  probable layering bilateral pleural effusions. Electronically Signed   By: Yetta Glassman M.D.   On: 02/16/2021 16:24   DG Abd 2 Views  Result Date: 02/16/2021 CLINICAL DATA:  Constipation, decreased ambulation EXAM: ABDOMEN - 2 VIEW COMPARISON:  CT 02/08/2021 FINDINGS: Right lung base clear.  Layering left pleural effusion. No free air. Normal bowel gas pattern. No abnormal abdominal calcifications. Regional bones unremarkable. IMPRESSION: 1. Nonobstructive bowel gas pattern. 2. Layering  left pleural effusion. Electronically Signed   By: Lucrezia Europe M.D.   On: 02/16/2021 12:35    EKG: Independently reviewed.  Normal sinus rhythm, nonspecific ST-T wave changes  Assessment/Plan Principal Problem:   Hypokalemia Active Problems:   Essential hypertension   HLD (hyperlipidemia)   Hypothyroidism   Obstructive sleep apnea   Prediabetes   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HCC)   Acute pulmonary embolism (HCC)   Chemotherapy induced neutropenia (HCC)   Atrial fibrillation with rapid ventricular response (HCC)   Meningioma (HCC)   Generalized weakness: Suspect multifactorial, progression of cancer, UTI, decreased p.o. intake. Patient presented with generalized weakness, decreased p.o. intake, downward decline. She is unable to get out of the bed and ambulate.  This could be due to worsening of her functional decline. There is no focal neurological deficit. PT and OT evaluation.  Hypokalemia: This could be secondary to decreased p.o. intake. Potassium replaced, continue to monitor  Metastatic colon cancer: Patient follows up with Dr. Grayland Ormond Patient is off chemotherapy,  reconsidering on reinitiation of chemotherapy. Oncologist Dr. Grayland Ormond notified. Patient has been taking Decadron 2 mg daily.  Atrial fibrillation: Heart rate controlled,  Continue Toprol and Xarelto  History of PE/DVT: Continue Xarelto  UTI: UA consistent with UTI, start ceftriaxone. Follow-up urine  culture  Hypothyroidism: Continue levothyroxine  Depression: Continue Remeron  Essential hypertension: Continue Toprol  Hyperlipidemia Continue atorvastatin   DVT prophylaxis: Xarelto Code Status: Full code Family Communication: No family at bedside Disposition Plan:   Status is: Inpatient  Remains inpatient appropriate because: Admitted for hypokalemia, generalized weakness, requiring IV replacement.  PT and OT eval for dispo safe placement   Consults called:  None Admission status: Inpatient   Shawna Clamp MD [facility]   If 7PM-7AM, please contact night-coverage [email or web address] Password [facility]  02/16/2021, 7:16 PM

## 2021-02-16 NOTE — ED Notes (Signed)
Left chest port accessed by this writer. Pt tolerated well.

## 2021-02-16 NOTE — ED Triage Notes (Signed)
Pt here via [facility] from home with constipation. Pt has not had a BM in a week. Pt also weak and unable to ambulate like she normally does. Pt in NAD in triage.

## 2021-02-16 NOTE — ED Provider Notes (Signed)
[facility]  ____________________________________________   Event Date/Time   First MD Initiated Contact with Patient 02/16/21 1545     (approximate)  I have reviewed the triage vital signs and the nursing notes.   HISTORY  Chief Complaint Constipation    HPI Judith Ross is a 65 y.o. female with past medical history of metastatic colon cancer, obesity, hyperlipidemia, hypertension, sleep apnea who presents with generalized malaise and fatigue and inability ambulate.  Patient is accompanied by her grandson who notes that over the last week she has really been unable to get up and walk.  She has been extremely weak and fatigued and is not eating or drinking.  Patient denies any focal weakness but feels weak all over.  She denies shortness of breath cough or chest pain.  Denies abdominal pain.  She has had some diarrhea but denies constipation despite the initial triage complaint.  Patient is not eating or drinking.  She was supposed to see her oncologist today but when they called and said she was unable to get up they advised that she come to the ER.  Per last oncology note, patient has stage IV adenocarcinoma with peritoneal carcinomatosis, had completed multiple cycles of chemotherapy and is currently off treatment.  She has been receiving IV fluids and considering whether to reinitiate treatment given her metastatic disease.  She has had hypokalemia in the past and overall decreased appetite and nausea and vomiting.         Past Medical History:  Diagnosis Date   Arthritis    Chicken pox    Helicobacter pylori gastritis    Hyperlipidemia    Hypertension    Metastatic colon cancer in female [facility]    Obesity    Sleep apnea    Thyroid disease     Patient Active Problem List   Diagnosis Date Noted   Thrush 01/18/2021   Generalized weakness 01/18/2021   Atrial fibrillation with RVR ([place]) 01/04/2021   Atrial fibrillation with rapid ventricular  response ([place]) 01/03/2021   Meningioma ([place]) 01/03/2021   Current use of long term anticoagulation 01/03/2021   History of pulmonary embolism 01/03/2021   Hypokalemia 01/03/2021   Diarrhea 01/03/2021   Non-sustained ventricular tachycardia 01/03/2021   Acute deep vein thrombosis (DVT) of right peroneal vein ([place]) 09/28/2020   Chemotherapy induced neutropenia ([place]) 09/28/2020   Acute pulmonary embolism ([place]) 09/18/2020   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[place]) 08/18/2020   Abdominal pain 07/14/2020   Prediabetes 10/08/2014   Essential hypertension 05/11/2014   HLD (hyperlipidemia) 05/11/2014   Hypothyroidism 05/11/2014   Arthritis 05/11/2014   Obstructive sleep apnea 05/11/2014    Past Surgical History:  Procedure Laterality Date   ABDOMINAL HYSTERECTOMY  12/2009   total   COLONOSCOPY WITH PROPOFOL N/A 08/13/2020   Procedure: COLONOSCOPY WITH PROPOFOL;  Surgeon: Jonathon Bellows, MD;  Location: [facility] ENDOSCOPY;  Service: Gastroenterology;  Laterality: N/A;   ESOPHAGOGASTRODUODENOSCOPY (EGD) WITH PROPOFOL N/A 08/13/2020   Procedure: ESOPHAGOGASTRODUODENOSCOPY (EGD) WITH PROPOFOL;  Surgeon: Jonathon Bellows, MD;  Location: [facility] ENDOSCOPY;  Service: Gastroenterology;  Laterality: N/A;   IR IMAGING GUIDED PORT INSERTION  08/27/2020   supracervical abdominal hysterectomy and bilateral salpingo--oophorectomy 01-17-2010 for fibroids  01/17/2010   TOTAL THYROIDECTOMY  1991-92   TUBAL LIGATION      Prior to Admission medications   Medication Sig Start Date End Date Taking? Authorizing Provider  atorvastatin (LIPITOR) 10 MG tablet TAKE 1 TABLET(10 MG) BY MOUTH DAILY 07/30/20   Dutch Quint  B, FNP  clobetasol ointment (TEMOVATE) 0.05 % Apply to affected area every night for 4 weeks, then every other day for 4 weeks and then twice a week for 4 weeks or until resolution. 05/14/17   [name], MD  dexamethasone (DECADRON) 2 MG tablet Take 1 tablet (2 mg total) by mouth daily. 02/08/21   Borders,  Kirt Boys, NP  feeding supplement (ENSURE ENLIVE / ENSURE PLUS) LIQD Take 237 mLs by mouth 3 (three) times daily between meals. 09/22/20   Nolberto Hanlon, MD  levothyroxine (SYNTHROID) 150 MCG tablet Take 1 tablet (150 mcg total) by mouth daily. 08/18/20   Venia Carbon, MD  metoprolol succinate (TOPROL-XL) 25 MG 24 hr tablet Take 1 tablet (25 mg total) by mouth daily. 01/12/21 02/11/21  Wyvonnia Dusky, MD  mirtazapine (REMERON) 7.5 MG tablet Take 1 tablet (7.5 mg total) by mouth at bedtime. 02/08/21   Borders, Kirt Boys, NP  nystatin (MYCOSTATIN) 100000 UNIT/ML suspension Take 1 teaspoon and swish in mouth then swallow three times daily 02/09/21   Venia Carbon, MD  oxyCODONE-acetaminophen (PERCOCET/ROXICET) 5-325 MG tablet Take 1 tablet by mouth every 4 (four) hours as needed for severe pain. Patient not taking: No sig reported 08/18/20   Lloyd Huger, MD  pantoprazole (PROTONIX) 40 MG tablet Take 1 tablet (40 mg total) by mouth 2 (two) times daily. Patient not taking: No sig reported 09/22/20 10/22/20  Nolberto Hanlon, MD  potassium chloride 20 MEQ/15ML (10%) SOLN Take 15 mLs (20 mEq total) by mouth 2 (two) times daily for 7 days. Patient not taking: No sig reported 01/11/21 01/18/21  Wyvonnia Dusky, MD  prochlorperazine (COMPAZINE) 10 MG tablet Take 10 mg by mouth 2 (two) times daily as needed for nausea or vomiting.    [provider]  XARELTO 20 MG TABS tablet TAKE 1 TABLET BY MOUTH DAILY WITH SUPPER. START ON FINAL FULL DAY OF LOVENOX INJECTIONS. 12/02/20   Lloyd Huger, MD    Allergies Erythromycin  Family History  Problem Relation Age of Onset   Stroke Father    Diabetes Maternal Grandmother    Hypertension Maternal Grandmother    Diabetes Maternal Uncle    Cancer Maternal Aunt        Breast    Social History Social History   Tobacco Use   Smoking status: Never   Smokeless tobacco: Never  Vaping Use   Vaping Use: Never used  Substance Use Topics    Alcohol use: No    Alcohol/week: 0.0 standard drinks   Drug use: No    Review of Systems   Review of Systems  Constitutional:  Positive for appetite change. Negative for chills and fever.  Respiratory:  Negative for shortness of breath.   Cardiovascular:  Negative for chest pain.  Gastrointestinal:  Positive for diarrhea, nausea and vomiting. Negative for abdominal pain.  Genitourinary:  Negative for dysuria.  Neurological:  Positive for weakness.  All other systems reviewed and are negative.  Physical Exam Updated Vital Signs BP 129/82 (BP Location: Left Arm)   Pulse 97   Temp 98.6 F (37 C) (Oral)   Resp 18   Ht 5\' 6"  (1.676 m)   Wt 72.6 kg   SpO2 95%   BMI 25.82 kg/m   Physical Exam Vitals and nursing note reviewed.  Constitutional:      General: She is not in acute distress.    Appearance: Normal appearance. She is ill-appearing.     Comments: Is  lying in bed with eyes closed, appears very weak, barely able to open her eyes  HENT:     Head: Normocephalic and atraumatic.     Mouth/Throat:     Mouth: Mucous membranes are dry.     Comments: Very dry, tacky mucous membranes Eyes:     General: No scleral icterus.    Conjunctiva/sclera: Conjunctivae normal.  Pulmonary:     Effort: Pulmonary effort is normal. No respiratory distress.     Breath sounds: No stridor.  Abdominal:     General: Abdomen is flat. There is no distension.     Palpations: Abdomen is soft.     Tenderness: There is no abdominal tenderness.  Musculoskeletal:        General: No deformity or signs of injury.     Cervical back: Normal range of motion.  Skin:    General: Skin is dry.     Coloration: Skin is not jaundiced or pale.  Neurological:     Mental Status: She is oriented to person, place, and time. Mental status is at baseline.     Comments: Patient is alert and oriented, she has 4-5 strength in the bilateral upper and lower extremities throughout, no focal deficits  Psychiatric:         Mood and Affect: Mood normal.        Behavior: Behavior normal.     LABS (all labs ordered are listed, but only abnormal results are displayed)  Labs Reviewed  CBC - Abnormal; Notable for the following components:      Result Value   RBC 3.72 (*)    Hemoglobin 11.1 (*)    HCT 34.1 (*)    RDW 22.3 (*)    nRBC 0.4 (*)    All other components within normal limits  BASIC METABOLIC PANEL - Abnormal; Notable for the following components:   Potassium 2.0 (*)    Calcium 7.6 (*)    All other components within normal limits  CBC - Abnormal; Notable for the following components:   RBC 3.68 (*)    Hemoglobin 10.9 (*)    HCT 33.6 (*)    RDW 22.2 (*)    nRBC 0.6 (*)    All other components within normal limits  RESP PANEL BY RT-PCR (FLU A&B, COVID) ARPGX2  MAGNESIUM  URINALYSIS, ROUTINE W REFLEX MICROSCOPIC  URINALYSIS, COMPLETE (UACMP) WITH MICROSCOPIC  HEPATIC FUNCTION PANEL  CBG MONITORING, ED  TROPONIN I (HIGH SENSITIVITY)   ____________________________________________  EKG Normal sinus rhythm, normal axis, normal intervals, nonspecific ST depression in V3 through V6 and the inferior leads ________________________________  RADIOLOGY I, Madelin Headings, personally viewed and evaluated these images (plain radiographs) as part of my medical decision making, as well as reviewing the written report by the radiologist.  ED MD interpretation:    Abdominal x-ray reviewed by myself shows a nonobstructive bowel gas pattern    ____________________________________________   PROCEDURES  Procedure(s) performed (including Critical Care):  Procedures   ____________________________________________   INITIAL IMPRESSION / ASSESSMENT AND PLAN / ED COURSE   Patient is a 65 year old female with metastatic colon cancer currently off chemotherapy and deciding whether to reinitiate who presents with weakness, decreased appetite and unable to ambulate.  Vital signs within normal  limits.  Patient appears unwell but nontoxic.  She is very dry barely able to lift her head up and is not able to stand up to transfer to the bed.  Her grandson notes that she has not been eating  anything nor has she been drinking and has not been able to get up.  She has been followed by oncology and be getting IV fluids and potassium supplementation over the last several weeks.  Labs today show severe acute hypokalemia with a potassium of 2.0, normal magnesium, creatinine is unchanged.  Her abdominal exam is benign and I have low suspicion for acute obstruction.  Abdominal x-ray was obtained from triage which shows a nonobstructive bowel gas pattern.  We will check a urine chest x-ray COVID and troponin but I suspect that this is related to the progression of her underlying disease.  We will give her fluids IV mag and IV potassium as well as Zofran.  Patient will require admission for her severe hypokalemia and decreased functional status.   ____________________________________________   FINAL CLINICAL IMPRESSION(S) / ED DIAGNOSES  Final diagnoses:  Constipation  Hypokalemia     ED Discharge Orders     None        Note:  This document was prepared using Dragon voice recognition software and may include unintentional dictation errors.    Rada Hay, MD 02/16/21 [phone]

## 2021-02-17 ENCOUNTER — Encounter: Payer: Self-pay | Admitting: Oncology

## 2021-02-17 ENCOUNTER — Other Ambulatory Visit: Payer: Medicare Other | Admitting: Student

## 2021-02-17 DIAGNOSIS — E876 Hypokalemia: Principal | ICD-10-CM

## 2021-02-17 DIAGNOSIS — I10 Essential (primary) hypertension: Secondary | ICD-10-CM

## 2021-02-17 DIAGNOSIS — D701 Agranulocytosis secondary to cancer chemotherapy: Secondary | ICD-10-CM

## 2021-02-17 DIAGNOSIS — Z515 Encounter for palliative care: Secondary | ICD-10-CM | POA: Diagnosis not present

## 2021-02-17 DIAGNOSIS — R531 Weakness: Secondary | ICD-10-CM | POA: Diagnosis not present

## 2021-02-17 DIAGNOSIS — T451X5A Adverse effect of antineoplastic and immunosuppressive drugs, initial encounter: Secondary | ICD-10-CM

## 2021-02-17 LAB — MAGNESIUM: Magnesium: 2.1 mg/dL (ref 1.7–2.4)

## 2021-02-17 LAB — CBC
HCT: 35.4 % — ABNORMAL LOW (ref 36.0–46.0)
Hemoglobin: 11.1 g/dL — ABNORMAL LOW (ref 12.0–15.0)
MCH: 28.6 pg (ref 26.0–34.0)
MCHC: 31.4 g/dL (ref 30.0–36.0)
MCV: 91.2 fL (ref 80.0–100.0)
Platelets: 241 10*3/uL (ref 150–400)
RBC: 3.88 MIL/uL (ref 3.87–5.11)
RDW: 22.5 % — ABNORMAL HIGH (ref 11.5–15.5)
WBC: 8.9 10*3/uL (ref 4.0–10.5)
nRBC: 0.3 % — ABNORMAL HIGH (ref 0.0–0.2)

## 2021-02-17 LAB — COMPREHENSIVE METABOLIC PANEL
ALT: 9 U/L (ref 0–44)
AST: 18 U/L (ref 15–41)
Albumin: 2.6 g/dL — ABNORMAL LOW (ref 3.5–5.0)
Alkaline Phosphatase: 57 U/L (ref 38–126)
Anion gap: 12 (ref 5–15)
BUN: 12 mg/dL (ref 8–23)
CO2: 28 mmol/L (ref 22–32)
Calcium: 7.2 mg/dL — ABNORMAL LOW (ref 8.9–10.3)
Chloride: 102 mmol/L (ref 98–111)
Creatinine, Ser: 0.58 mg/dL (ref 0.44–1.00)
GFR, Estimated: >60 mL/min (ref >60)
Glucose, Bld: 70 mg/dL (ref 70–99)
Potassium: 2.8 mmol/L — ABNORMAL LOW (ref 3.5–5.1)
Sodium: 142 mmol/L (ref 135–145)
Total Bilirubin: 1.8 mg/dL — ABNORMAL HIGH (ref 0.3–1.2)
Total Protein: 5.7 g/dL — ABNORMAL LOW (ref 6.5–8.1)

## 2021-02-17 LAB — PHOSPHORUS: Phosphorus: 3.1 mg/dL (ref 2.5–4.6)

## 2021-02-17 MED ORDER — POTASSIUM CHLORIDE CRYS ER 20 MEQ PO TBCR
40.0000 meq | EXTENDED_RELEASE_TABLET | Freq: Once | ORAL | Status: DC
  Filled 2021-02-17: qty 2

## 2021-02-17 MED ORDER — INFLUENZA VAC A&B SA ADJ QUAD 0.5 ML IM PRSY
0.5000 mL | PREFILLED_SYRINGE | INTRAMUSCULAR | Status: AC
  Administered 2021-02-19: 10:00:00 0.5 mL via INTRAMUSCULAR
  Filled 2021-02-17: qty 0.5

## 2021-02-17 MED ORDER — CHLORHEXIDINE GLUCONATE CLOTH 2 % EX PADS
6.0000 | MEDICATED_PAD | Freq: Every day | CUTANEOUS | Status: DC
  Administered 2021-02-18 – 2021-02-21 (×4): 6 via TOPICAL

## 2021-02-17 NOTE — Evaluation (Signed)
Physical Therapy Evaluation Patient Details Name: Judith Ross MRN: [id number] DOB: 23-Nov-1955 Today's Date: 02/17/2021  History of Present Illness  65 y.o. female presenting to ED with generalized weakness, fatigue and inability to ambulate. Also very poor p.o intake. Decline has has progressed over the past 2 weeks. PMH significant for metastatic colon cancer with peritoneal carcinomatosis for which she has completed multiple cycles of chemotherapy and currently is off chemotherapy. Other PMH includes A. fib, history of PE on Xarelto, obesity, hyperlipidemia, hypertension, sleep apnea.  Clinical Impression  Pt received supine in bed, cousin at bedside and agreeable to therapy. 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 Over the past 2 weeks she has had a significant decline in function - prior to this decline, pt was able to ambulate household distances and perform basic transfers with assistance from family. Care has been provided at bed level for the past 2 weeks.  Pt was motivated to participate as much as she could with therapy. Bed mobility was performed with MOD-MAX A; she experienced difficulty sitting EOB due to lack of postural control and posterior LOB at trunk. STS not attempted for safety - would require +2 or 3 assist. Education provided to pt and family on goals of therapy, d/c recs, d/c considerations if pt and family accept hospice care; family asked for exercise packets so they can work with pt on strengthening - packets and thera-putty provided and reviewed (grip strength was a major concern to family). PT recommending SNF if family declines hospice care in order to maximize ability to complete functional activities at home to decrease caregiver burnout/burden. If pt/family choose to return home, pt would benefit from a hospital bed and hoyer lift. Would benefit from skilled PT to address  above deficits and promote optimal return to PLOF.      Recommendations for follow up therapy are one component of a multi-disciplinary discharge planning process, led by the attending physician.  Recommendations may be updated based on patient status, additional functional criteria and insurance authorization.  Follow Up Recommendations Skilled nursing-short term rehab (<3 hours/day)    Assistance Recommended at Discharge Frequent or constant Supervision/Assistance  Functional Status Assessment Patient has had a recent decline in their functional status and/or demonstrates limited ability to make significant improvements in function in a reasonable and predictable amount of time  Equipment Recommendations  None recommended by PT (if pt/family choses to go home w/o hospice, would recommend a hospital bed and hoyer lift)    Recommendations for Other Services       Precautions / Restrictions Precautions Precautions: Fall Restrictions Weight Bearing Restrictions: No      Mobility  Bed Mobility Overal bed mobility: Needs Assistance Bed Mobility: Supine to Sit;Sit to Supine     Supine to sit: Mod assist;HOB elevated Sit to supine: Max assist   General bed mobility comments: MOD A to help lift trunk followed by PT rotation hips to sit square on EOB. MAX A to manage BLE and trunk back to bed.    Transfers                   General transfer comment: deferred for pt safety - would require +2 or +3 assist    Ambulation/Gait                  Stairs            Wheelchair  Mobility    Modified Rankin (Stroke Patients Only)       Balance Overall balance assessment: Needs assistance Sitting-balance support: Feet unsupported;Bilateral upper extremity supported Sitting balance-Leahy Scale: Poor Sitting balance - Comments: frequent posterior LOB - CGA-MOD A provided at all times. Pt followed VC to crunch forward well however was unable to maintain  position. Postural control: Posterior lean                                   Pertinent Vitals/Pain Pain Assessment: No/denies pain    Home Living Family/patient expects to be discharged to:: Private residence Living Arrangements: Other relatives (grandson) Available Help at Discharge: Family;Available 24 hours/day Type of Home: House Home Access: Stairs to enter Entrance Stairs-Rails: Right (pt unsure which side railing is on) Entrance Stairs-Number of Steps: 2   Home Layout: One level Home Equipment: Conservation officer, nature (2 wheels);Wheelchair - manual;Cane - single point Additional Comments: receieved w/c this week; was using RW to ambulate prior to 2 weeks ago    Prior Function Prior Level of Function : Needs assist       Physical Assist : Mobility (physical);ADLs (physical) Mobility (physical): Bed mobility;Transfers;Gait;Stairs ADLs (physical): Grooming;Bathing;Dressing;Toileting;IADLs Mobility Comments: family member with pt at all times during standing mobility ADLs Comments: Assist with all ADLs - pt typically able to feed herself however has required assistance over the past 2 weeks     Hand Dominance        Extremity/Trunk Assessment   Upper Extremity Assessment Upper Extremity Assessment: Generalized weakness (especially with eccentric control; RUE weaker than left)    Lower Extremity Assessment Lower Extremity Assessment: Generalized weakness (4-/5 knee ext and DF; 2/5 hip flexion. Difficulty maintaining muscle activation for MMT.)    Cervical / Trunk Assessment Cervical / Trunk Assessment: Kyphotic  Communication   Communication: No difficulties  Cognition Arousal/Alertness: Awake/alert;Lethargic Behavior During Therapy: WFL for tasks assessed/performed Overall Cognitive Status: Impaired/Different from baseline Area of Impairment: Memory                     Memory: Decreased short-term memory         General Comments: pt having  difficulty with PLOF - pt answered and cousin quietly corrected.        General Comments      Exercises Other Exercises Other Exercises: Pt education on goals of therapy, d/c recs, d/c considerations if pt and family accept hospice care; family asked for exercise packets so that family can work with pt on strengthening - packets and putty provided and reviewed (grip strength was a major concern to family).   Assessment/Plan    PT Assessment Patient needs continued PT services  PT Problem List Decreased strength;Decreased mobility;Decreased activity tolerance;Decreased balance       PT Treatment Interventions Therapeutic exercise;Gait training;Balance training;Neuromuscular re-education;Functional mobility training;Therapeutic activities;Patient/family education    PT Goals (Current goals can be found in the Care Plan section)  Acute Rehab PT Goals Patient Stated Goal: to go home PT Goal Formulation: With patient/family Time For Goal Achievement: 03/03/21 Potential to Achieve Goals: Poor    Frequency Min 2X/week   Barriers to discharge Decreased caregiver support Pt now requiring bed level care due to significant decline in function.    Co-evaluation               AM-PAC PT "6 Clicks" Mobility  Outcome Measure Help needed turning from your  back to your side while in a flat bed without using bedrails?: A Lot Help needed moving from lying on your back to sitting on the side of a flat bed without using bedrails?: A Lot Help needed moving to and from a bed to a chair (including a wheelchair)?: Total Help needed standing up from a chair using your arms (e.g., wheelchair or bedside chair)?: Total Help needed to walk in hospital room?: Total Help needed climbing 3-5 steps with a railing? : Total 6 Click Score: 8    End of Session   Activity Tolerance: Patient limited by fatigue Patient left: in bed;with call bell/phone within reach;with family/visitor present Nurse  Communication: Mobility status PT Visit Diagnosis: Muscle weakness (generalized) (M62.81);Adult, failure to thrive (R62.7)    Time: 1212-1300 PT Time Calculation (min) (ACUTE ONLY): 48 min   Charges:   PT Evaluation $PT Eval Moderate Complexity: 1 Mod PT Treatments $Therapeutic Exercise: 8-22 mins $Therapeutic Activity: 23-37 mins        Patrina Levering PT, DPT 02/17/21 2:35 PM [phone]

## 2021-02-17 NOTE — Assessment & Plan Note (Signed)
Continue levothyroxine 

## 2021-02-17 NOTE — Assessment & Plan Note (Signed)
Patient leaning towards comfort care/hospice.  Palliative care/Josh had discussion with patient and family.

## 2021-02-17 NOTE — ED Notes (Signed)
Dr. Shah at bedside.

## 2021-02-17 NOTE — Consult Note (Signed)
[facility]  [phone] [phone]) [phone]  ID: Judith Ross OB: [birth year]  MR#: [id number]  XBM#:[id number]  Patient Care Team: Venia Carbon, MD as PCP - General (Internal Medicine) Kate Sable, MD as PCP - Cardiology (Cardiology) Clent Jacks, RN as Oncology Nurse Navigator Grayland Ormond, Kathlene November, MD as Consulting Physician (Hematology and Oncology)  CHIEF COMPLAINT: 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  Now with failure to thrive.  INTERVAL HISTORY: Patient is a 65 year old female recently admitted to the hospital with poor appetite, weight loss, and failure to thrive.  Patient last received chemotherapy on December 22, 2020.  She continues to have significant weakness and fatigue and a poor appetite.  She has no neurologic complaints.  She denies any fevers.  She has no chest pain, shortness of breath, cough, or hemoptysis.  She denies any nausea, vomiting, constipation, or diarrhea.  She has no urinary complaints.  Patient offers no further specific complaints today.  REVIEW OF SYSTEMS:   Review of Systems  Constitutional:  Positive for malaise/fatigue and weight loss. Negative for fever.  Respiratory: Negative.  Negative for cough, hemoptysis and shortness of breath.   Cardiovascular: Negative.  Negative for chest pain and leg swelling.  Gastrointestinal: Negative.  Negative for abdominal pain, nausea and vomiting.  Genitourinary: Negative.  Negative for dysuria.  Musculoskeletal: Negative.  Negative for back pain.  Skin: Negative.  Negative for rash.  Neurological:  Positive for weakness. Negative for dizziness, focal weakness and headaches.  Psychiatric/Behavioral: Negative.  The patient is not nervous/anxious.    As per HPI. Otherwise, a complete review of systems is negative.  PAST MEDICAL HISTORY: Past Medical History:  Diagnosis Date   Arthritis    Chicken pox    Helicobacter pylori  gastritis    Hyperlipidemia    Hypertension    Metastatic colon cancer in female [facility])    Obesity    Sleep apnea    Thyroid disease     PAST SURGICAL HISTORY: Past Surgical History:  Procedure Laterality Date   ABDOMINAL HYSTERECTOMY  12/2009   total   COLONOSCOPY WITH PROPOFOL N/A 08/13/2020   Procedure: COLONOSCOPY WITH PROPOFOL;  Surgeon: Jonathon Bellows, MD;  Location: [facility] ENDOSCOPY;  Service: Gastroenterology;  Laterality: N/A;   ESOPHAGOGASTRODUODENOSCOPY (EGD) WITH PROPOFOL N/A 08/13/2020   Procedure: ESOPHAGOGASTRODUODENOSCOPY (EGD) WITH PROPOFOL;  Surgeon: Jonathon Bellows, MD;  Location[facility] ENDOSCOPY;  Service: Gastroenterology;  Laterality: N/A;   IR IMAGING GUIDED PORT INSERTION  08/27/2020   supracervical abdominal hysterectomy and bilateral salpingo--oophorectomy 01-17-2010 for fibroids  01/17/2010   TOTAL THYROIDECTOMY  1991-92   TUBAL LIGATION      FAMILY HISTORY: Family History  Problem Relation Age of Onset   Stroke Father    Diabetes Maternal Grandmother    Hypertension Maternal Grandmother    Diabetes Maternal Uncle    Cancer Maternal Aunt        Breast    ADVANCED DIRECTIVES (Y/N):  $Remov'@ADVDIR'LcHdfj$ @  HEALTH MAINTENANCE: Social History   Tobacco Use   Smoking status: Never   Smokeless tobacco: Never  Vaping Use   Vaping Use: Never used  Substance Use Topics   Alcohol use: No    Alcohol/week: 0.0 standard drinks   Drug use: No     Colonoscopy:  PAP:  Bone density:  Lipid panel:  Allergies  Allergen Reactions   Erythromycin Itching    Current Facility-Administered Medications  Medication Dose Route Frequency Provider Last Rate Last Admin  0.9 %  sodium chloride infusion   Intravenous Continuous Cipriano Bunker, MD 75 mL/hr at 02/17/21 0503 Rate Verify at 02/17/21 0503   acetaminophen (TYLENOL) tablet 650 mg  650 mg Oral Q6H PRN Cipriano Bunker, MD       Or   acetaminophen (TYLENOL) suppository 650 mg  650 mg Rectal Q6H PRN Cipriano Bunker, MD        cefTRIAXone (ROCEPHIN) 1 g in sodium chloride 0.9 % 100 mL IVPB  1 g Intravenous Q24H Cipriano Bunker, MD   Stopped at 02/16/21 2143   dexamethasone (DECADRON) tablet 2 mg  2 mg Oral Daily Cipriano Bunker, MD   2 mg at 02/17/21 1018   docusate sodium (COLACE) capsule 100 mg  100 mg Oral BID Cipriano Bunker, MD   100 mg at 02/17/21 1128   feeding supplement (ENSURE ENLIVE / ENSURE PLUS) liquid 237 mL  237 mL Oral TID BM Cipriano Bunker, MD       levothyroxine (SYNTHROID) tablet 150 mcg  150 mcg Oral Daily Cipriano Bunker, MD   150 mcg at 02/17/21 0547   metoprolol succinate (TOPROL-XL) 24 hr tablet 25 mg  25 mg Oral Daily Cipriano Bunker, MD   25 mg at 02/17/21 1128   mirtazapine (REMERON) tablet 7.5 mg  7.5 mg Oral QHS Cipriano Bunker, MD   7.5 mg at 02/16/21 2104   ondansetron (ZOFRAN) tablet 4 mg  4 mg Oral Q6H PRN Cipriano Bunker, MD       Or   ondansetron [facility]) injection 4 mg  4 mg Intravenous Q6H PRN Cipriano Bunker, MD       pantoprazole (PROTONIX) EC tablet 40 mg  40 mg Oral BID Cipriano Bunker, MD   40 mg at 02/16/21 2106   potassium chloride SA (KLOR-CON M) CR tablet 40 mEq  40 mEq Oral Once Delfino Lovett, MD       prochlorperazine (COMPAZINE) tablet 10 mg  10 mg Oral BID PRN Cipriano Bunker, MD       rivaroxaban Carlena Hurl) tablet 20 mg  20 mg Oral Daily Cipriano Bunker, MD   20 mg at 02/17/21 1018   Current Outpatient Medications  Medication Sig Dispense Refill   atorvastatin (LIPITOR) 10 MG tablet TAKE 1 TABLET(10 MG) BY MOUTH DAILY 90 tablet 0   dexamethasone (DECADRON) 2 MG tablet Take 1 tablet (2 mg total) by mouth daily. 14 tablet 0   levothyroxine (SYNTHROID) 150 MCG tablet Take 1 tablet (150 mcg total) by mouth daily. 90 tablet 3   metoprolol succinate (TOPROL-XL) 25 MG 24 hr tablet Take 1 tablet (25 mg total) by mouth daily. 30 tablet 0   mirtazapine (REMERON) 7.5 MG tablet Take 1 tablet (7.5 mg total) by mouth at bedtime. 30 tablet 0   nystatin (MYCOSTATIN) 100000 UNIT/ML suspension Take  1 teaspoon and swish in mouth then swallow three times daily 120 mL 0   ondansetron (ZOFRAN) 8 MG tablet Take 8 mg by mouth 2 (two) times daily as needed for nausea or vomiting.     XARELTO 20 MG TABS tablet TAKE 1 TABLET BY MOUTH DAILY WITH SUPPER. START ON FINAL FULL DAY OF LOVENOX INJECTIONS. 30 tablet 3   feeding supplement (ENSURE ENLIVE / ENSURE PLUS) LIQD Take 237 mLs by mouth 3 (three) times daily between meals. 237 mL 12   oxyCODONE-acetaminophen (PERCOCET/ROXICET) 5-325 MG tablet Take 1 tablet by mouth every 4 (four) hours as needed for severe pain. (Patient not taking: Reported on 01/27/2021) 30 tablet 0  pantoprazole (PROTONIX) 40 MG tablet Take 1 tablet (40 mg total) by mouth 2 (two) times daily. (Patient not taking: No sig reported) 60 tablet 0   potassium chloride 20 MEQ/15ML (10%) SOLN Take 15 mLs (20 mEq total) by mouth 2 (two) times daily for 7 days. (Patient not taking: No sig reported) 210 mL 0    OBJECTIVE: Vitals:   02/17/21 0700 02/17/21 0730  BP: 134/80 (!) 149/97  Pulse: 88 85  Resp: 18 18  Temp:    SpO2: 96% 97%     Body mass index is 25.82 kg/m.    ECOG FS:4 - Bedbound  General: Ill-appearing, no acute distress. Eyes: Pink conjunctiva, anicteric sclera. HEENT: Normocephalic, moist mucous membranes. Lungs: No audible wheezing or coughing. Heart: Regular rate and rhythm. Abdomen: Soft, nontender, no obvious distention. Musculoskeletal: No edema, cyanosis, or clubbing. Neuro: Alert. Cranial nerves grossly intact. Skin: No rashes or petechiae noted. Psych: Flat affect.   LAB RESULTS:  Lab Results  Component Value Date   NA 142 02/17/2021   K 2.8 (L) 02/17/2021   CL 102 02/17/2021   CO2 28 02/17/2021   GLUCOSE 70 02/17/2021   BUN 12 02/17/2021   CREATININE 0.58 02/17/2021   CALCIUM 7.2 (L) 02/17/2021   PROT 5.7 (L) 02/17/2021   ALBUMIN 2.6 (L) 02/17/2021   AST 18 02/17/2021   ALT 9 02/17/2021   ALKPHOS 57 02/17/2021   BILITOT 1.8 (H) 02/17/2021    GFRNONAA >60 02/17/2021   GFRAA  01/10/2010    >60        The eGFR has been calculated using the MDRD equation. This calculation has not been validated in all clinical situations. eGFR's persistently <60 mL/min signify possible Chronic Kidney Disease.    Lab Results  Component Value Date   WBC 8.9 02/17/2021   NEUTROABS 5.0 02/08/2021   HGB 11.1 (L) 02/17/2021   HCT 35.4 (L) 02/17/2021   MCV 91.2 02/17/2021   PLT 241 02/17/2021     STUDIES: CT CHEST ABDOMEN PELVIS W CONTRAST  Result Date: 02/08/2021 CLINICAL DATA:  Stage IV colon adenocarcinoma with peritoneal carcinomatosis diagnosed in June 2022. Pulmonary embolism on Xarelto. Abdominal pain and clinical decline. EXAM: CT CHEST, ABDOMEN, AND PELVIS WITH CONTRAST TECHNIQUE: Multidetector CT imaging of the chest, abdomen and pelvis was performed following the standard protocol during bolus administration of intravenous contrast. CONTRAST:  175mL OMNIPAQUE IOHEXOL 300 MG/ML  SOLN COMPARISON:  Prior CTs 11/23/2020 FINDINGS: CT CHEST FINDINGS Cardiovascular: Study was not performed as a dedicated CTA, although there is adequate opacification of the pulmonary arteries, and no recurrent pulmonary emboli are demonstrated. Mild atherosclerosis of the aorta and coronary arteries. A right IJ Port-A-Cath extends to the superior cavoatrial junction. The heart size is normal. There is no pericardial effusion. Mediastinum/Nodes: There are no enlarged mediastinal, hilar or axillary lymph nodes. Suspected previous thyroidectomy. The trachea and esophagus appear unremarkable. Lungs/Pleura: Enlarging bilateral pleural effusions, now moderate. No definite pleural base masses or abnormal enhancement. There is minimal nodularity along the right major fissure which is similar to previous study. There is compressive atelectasis dependently in both lungs. No suspicious pulmonary nodules. Musculoskeletal/Chest wall: Grossly stable bilateral breast masses,  without significant hypermetabolic activity on PET-CT 08/25/2020. No other chest wall lesion or suspicious osseous findings. CT ABDOMEN AND PELVIS FINDINGS Hepatobiliary: The liver is normal in density without suspicious focal abnormality. No evidence of gallstones, gallbladder wall thickening or biliary dilatation. Pancreas: Unremarkable. No pancreatic ductal dilatation or surrounding inflammatory changes. Spleen:  Normal in size without focal abnormality. Adrenals/Urinary Tract: Both adrenal glands appear normal. Stable small cyst in the interpolar region of the right kidney. No evidence of enhancing renal mass, urinary tract calculus or hydronephrosis. The bladder appears unremarkable for its degree of distention. Stomach/Bowel: Enteric contrast was administered and has passed into the distal colon. The stomach appears unremarkable for its degree of distention. Mild wall thickening of the ascending and transverse colon appears improved. No small bowel wall thickening or distention. Vascular/Lymphatic: There are no enlarged abdominal or pelvic lymph nodes. Aortic and branch vessel atherosclerosis without acute vascular findings. The portal, superior mesenteric and splenic veins are patent. Reproductive: The uterus and adnexal regions appear unchanged. Probable pelvic floor laxity. Other: Overall volume of ascites is stable to minimally increased from the previous study. Nodularity previously seen within the omentum has improved, with a 9 mm nodule in the gastrocolic ligament on image 73/2 (previously 13 mm). No new or enlarging nodules are identified. Musculoskeletal: No acute or significant osseous findings. Lumbar spine facet arthropathy. IMPRESSION: 1. Further improvement in previously demonstrated omental nodularity consistent with improving carcinomatosis. Overall volume of abdominal ascites is stable to slightly increased. 2. Enlarging moderate-sized bilateral pleural effusions without definite pleural base  nodularity. 3. No evidence of solid visceral organ, pulmonary or osseous metastatic disease. 4. Stable bilateral breast nodules. Electronically Signed   By: Richardean Sale M.D.   On: 02/08/2021 14:42   DG Chest Portable 1 View  Result Date: 02/16/2021 CLINICAL DATA:  Shortness of breath. EXAM: PORTABLE CHEST 1 VIEW COMPARISON:  Chest x-ray dated September 22, 2020 FINDINGS: Right chest wall port is unchanged in position. Cardiac and mediastinal contours are unchanged and within normal limits for AP technique. Hazy opacities of the bilateral lower lungs, likely due to layering pleural effusions. Additional bilateral linear opacities are seen which are likely due to atelectasis. No evidence of pneumothorax. IMPRESSION: Atelectasis with probable layering bilateral pleural effusions. Electronically Signed   By: Yetta Glassman M.D.   On: 02/16/2021 16:24   DG Abd 2 Views  Result Date: 02/16/2021 CLINICAL DATA:  Constipation, decreased ambulation EXAM: ABDOMEN - 2 VIEW COMPARISON:  CT 02/08/2021 FINDINGS: Right lung base clear.  Layering left pleural effusion. No free air. Normal bowel gas pattern. No abnormal abdominal calcifications. Regional bones unremarkable. IMPRESSION: 1. Nonobstructive bowel gas pattern. 2. Layering left pleural effusion. Electronically Signed   By: Lucrezia Europe M.D.   On: 02/16/2021 12:35    ASSESSMENT: 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  Now with failure to thrive.  PLAN:    1.  Stage IV adenocarcinoma of the colon with peritoneal carcinomatosis: Patient last received chemotherapy with FOLFIRI plus Avastin on December 22, 2020.  CT scan from February 08, 2021 reviewed independently and reported as above with improvement of disease.  Despite this, patient's performance status continues to decline and family reports that she is essentially bedridden.  No further treatments are planned at this time.  Patient and family are considering hospice.  Appreciate  kind of care input. 2.  Failure to thrive: Likely multifactorial despite improvement of malignancy.  Patient has not had chemotherapy in nearly 2 months.  Continue to encourage p.o. intake.  Possible hospice as above. 3.  Hypokalemia: Continue electrolyte placement therapy as ordered. 4.  Atrial fibrillation: Continue metoprolol and Eliquis as ordered. 5.  History of pulmonary embolism: Eliquis as above. 6.  Hyperbilirubinemia: Chronic and unchanged.  Patient's most recent bilirubin is 1.8.  Appreciate  consult, will follow.  Lloyd Huger, MD   02/17/2021 12:09 PM

## 2021-02-17 NOTE — Assessment & Plan Note (Signed)
Replete and recheck ?

## 2021-02-17 NOTE — ED Notes (Addendum)
Pt states she wants to take a break from taking her pills- will try again later this morning

## 2021-02-17 NOTE — Assessment & Plan Note (Signed)
Multifactorial.  PT, OT eval depending on goals of care discussion

## 2021-02-17 NOTE — ED Notes (Signed)
Pt wants to take another break from taking medications- will try again later

## 2021-02-17 NOTE — ED Notes (Signed)
Patient repositioned in bed, is resting comfortably.

## 2021-02-17 NOTE — Hospital Course (Addendum)
65 year old female with a known history of metastatic colon cancer, peritoneal carcinomatosis is admitted for generalized weakness and adult failure to thrive  12/1: Patient leaning towards comfort care/hospice 12/2: Waiting for hospice home bed

## 2021-02-17 NOTE — Consult Note (Signed)
[facility] at [facility] Telephone:([phone]) [phone] [phone]) [phone]   Name: Judith Ross Date: 02/17/2021 MRN: [id number]  DOB: [birth year]  Patient Care Team: Venia Carbon, MD as PCP - General (Internal Medicine) Kate Sable, MD as PCP - Cardiology (Cardiology) Clent Jacks, RN as Oncology Nurse Navigator Grayland Ormond, Kathlene November, MD as Consulting Physician (Hematology and Oncology)    REASON FOR CONSULTATION: Judith Ross is a 65 y.o. 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  Patient was hospitalized 10/72/22-01/11/2021 with A. fib with RVR and persistent hypokalemia.  CT of the chest, abdomen, and pelvis on 02/06/2021 revealed interval improvement with improving carcinomatosis and no evidence of solid visceral organ, pulmonary, or osseous metastatic disease.  Unfortunately, patient has had declining performance status with minimal oral intake.  She was readmitted to the hospital 02/16/2021 with severe hypokalemia and dehydration.  Palliative care was consulted to address goals and manage ongoing symptoms..   SOCIAL HISTORY:     reports that she has never smoked. She has never used smokeless tobacco. She reports that she does not drink alcohol and does not use drugs.  Patient is divorced.  She lives at home with a grandson.  She has a son and a friend, Judith Ross, who is involved in her care.  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  ADVANCE DIRECTIVES:  Does not have  CODE STATUS: Full code  PAST MEDICAL HISTORY: Past Medical History:  Diagnosis Date   Arthritis    Chicken pox    Helicobacter pylori gastritis    Hyperlipidemia    Hypertension    Metastatic colon cancer in female [facility])    Obesity    Sleep apnea    Thyroid disease     PAST SURGICAL HISTORY:  Past Surgical  History:  Procedure Laterality Date   ABDOMINAL HYSTERECTOMY  12/2009   total   COLONOSCOPY WITH PROPOFOL N/A 08/13/2020   Procedure: COLONOSCOPY WITH PROPOFOL;  Surgeon: Jonathon Bellows, MD;  Location[facility] ENDOSCOPY;  Service: Gastroenterology;  Laterality: N/A;   ESOPHAGOGASTRODUODENOSCOPY (EGD) WITH PROPOFOL N/A 08/13/2020   Procedure: ESOPHAGOGASTRODUODENOSCOPY (EGD) WITH PROPOFOL;  Surgeon: Jonathon Bellows, MD;  Location: [facility] ENDOSCOPY;  Service: Gastroenterology;  Laterality: N/A;   IR IMAGING GUIDED PORT INSERTION  08/27/2020   supracervical abdominal hysterectomy and bilateral salpingo--oophorectomy 01-17-2010 for fibroids  01/17/2010   TOTAL THYROIDECTOMY  1991-92   TUBAL LIGATION      HEMATOLOGY/ONCOLOGY HISTORY:  Oncology History  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[place])  08/18/2020 Initial Diagnosis   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[place])   08/18/2020 Cancer Staging   Staging form: Colon and Rectum - Neuroendocine Tumors, AJCC 8th Edition - Clinical stage from 08/18/2020: Stage IV (cTX, cNX, pM1b) - Signed by Lloyd Huger, MD on 08/18/2020 Stage prefix: Initial diagnosis    08/31/2020 - 09/16/2020 Chemotherapy          10/05/2020 -  Chemotherapy   Patient is on Treatment Plan : COLORECTAL FOLFIRI / BEVACIZUMAB Q14D       ALLERGIES:  is allergic to erythromycin.  MEDICATIONS:  Current Facility-Administered Medications  Medication Dose Route Frequency Provider Last Rate Last Admin   0.9 %  sodium chloride infusion   Intravenous Continuous Shawna Clamp, MD 75 mL/hr at 02/17/21 0503 Rate Verify at 02/17/21 0503   acetaminophen (TYLENOL) tablet 650 mg  650  mg Oral Q6H PRN Shawna Clamp, MD       Or   acetaminophen (TYLENOL) suppository 650 mg  650 mg Rectal Q6H PRN Shawna Clamp, MD       cefTRIAXone (ROCEPHIN) 1 g in sodium chloride 0.9 % 100 mL IVPB  1 g Intravenous Q24H Shawna Clamp, MD   Stopped at 02/16/21 2143   dexamethasone (DECADRON) tablet 2 mg  2 mg Oral  Daily Shawna Clamp, MD   2 mg at 02/17/21 0036   docusate sodium (COLACE) capsule 100 mg  100 mg Oral BID Shawna Clamp, MD   100 mg at 02/16/21 2106   feeding supplement (ENSURE ENLIVE / ENSURE PLUS) liquid 237 mL  237 mL Oral TID BM Shawna Clamp, MD       levothyroxine (SYNTHROID) tablet 150 mcg  150 mcg Oral Daily Shawna Clamp, MD   150 mcg at 02/17/21 0547   metoprolol succinate (TOPROL-XL) 24 hr tablet 25 mg  25 mg Oral Daily Shawna Clamp, MD   25 mg at 02/16/21 2106   mirtazapine (REMERON) tablet 7.5 mg  7.5 mg Oral QHS Shawna Clamp, MD   7.5 mg at 02/16/21 2104   ondansetron (ZOFRAN) tablet 4 mg  4 mg Oral Q6H PRN Shawna Clamp, MD       Or   ondansetron [facility]) injection 4 mg  4 mg Intravenous Q6H PRN Shawna Clamp, MD       pantoprazole (PROTONIX) EC tablet 40 mg  40 mg Oral BID Shawna Clamp, MD   40 mg at 02/16/21 2106   potassium chloride SA (KLOR-CON M) CR tablet 40 mEq  40 mEq Oral Once Max Sane, MD       prochlorperazine (COMPAZINE) tablet 10 mg  10 mg Oral BID PRN Shawna Clamp, MD       rivaroxaban Alveda Reasons) tablet 20 mg  20 mg Oral Daily Shawna Clamp, MD   20 mg at 02/17/21 0036   Current Outpatient Medications  Medication Sig Dispense Refill   atorvastatin (LIPITOR) 10 MG tablet TAKE 1 TABLET(10 MG) BY MOUTH DAILY 90 tablet 0   dexamethasone (DECADRON) 2 MG tablet Take 1 tablet (2 mg total) by mouth daily. 14 tablet 0   levothyroxine (SYNTHROID) 150 MCG tablet Take 1 tablet (150 mcg total) by mouth daily. 90 tablet 3   metoprolol succinate (TOPROL-XL) 25 MG 24 hr tablet Take 1 tablet (25 mg total) by mouth daily. 30 tablet 0   mirtazapine (REMERON) 7.5 MG tablet Take 1 tablet (7.5 mg total) by mouth at bedtime. 30 tablet 0   nystatin (MYCOSTATIN) 100000 UNIT/ML suspension Take 1 teaspoon and swish in mouth then swallow three times daily 120 mL 0   ondansetron (ZOFRAN) 8 MG tablet Take 8 mg by mouth 2 (two) times daily as needed for nausea or vomiting.      XARELTO 20 MG TABS tablet TAKE 1 TABLET BY MOUTH DAILY WITH SUPPER. START ON FINAL FULL DAY OF LOVENOX INJECTIONS. 30 tablet 3   feeding supplement (ENSURE ENLIVE / ENSURE PLUS) LIQD Take 237 mLs by mouth 3 (three) times daily between meals. 237 mL 12   oxyCODONE-acetaminophen (PERCOCET/ROXICET) 5-325 MG tablet Take 1 tablet by mouth every 4 (four) hours as needed for severe pain. (Patient not taking: Reported on 01/27/2021) 30 tablet 0   pantoprazole (PROTONIX) 40 MG tablet Take 1 tablet (40 mg total) by mouth 2 (two) times daily. (Patient not taking: No sig reported) 60 tablet 0   potassium chloride 20  MEQ/15ML (10%) SOLN Take 15 mLs (20 mEq total) by mouth 2 (two) times daily for 7 days. (Patient not taking: No sig reported) 210 mL 0    VITAL SIGNS: BP (!) 149/97   Pulse 85   Temp 98.6 F (37 C) (Oral)   Resp 18   Ht $R'5\' 6"'TV$  (1.676 m)   Wt 160 lb (72.6 kg)   SpO2 97%   BMI 25.82 kg/m  Filed Weights   02/16/21 1135  Weight: 160 lb (72.6 kg)    Estimated body mass index is 25.82 kg/m as calculated from the following:   Height as of this encounter: $RemoveBeforeD'5\' 6"'phoJFiVpqfeGJD$  (1.676 m).   Weight as of this encounter: 160 lb (72.6 kg).  LABS: CBC:    Component Value Date/Time   WBC 8.9 02/17/2021 0614   HGB 11.1 (L) 02/17/2021 0614   HCT 35.4 (L) 02/17/2021 0614   PLT 241 02/17/2021 0614   PLT 554 (H) 07/30/2020 1449   MCV 91.2 02/17/2021 0614   NEUTROABS 5.0 02/08/2021 1024   LYMPHSABS 2.2 02/08/2021 1024   MONOABS 0.6 02/08/2021 1024   EOSABS 0.1 02/08/2021 1024   BASOSABS 0.0 02/08/2021 1024   Comprehensive Metabolic Panel:    Component Value Date/Time   NA 142 02/17/2021 0614   K 2.8 (L) 02/17/2021 0614   CL 102 02/17/2021 0614   CO2 28 02/17/2021 0614   BUN 12 02/17/2021 0614   CREATININE 0.58 02/17/2021 0614   GLUCOSE 70 02/17/2021 0614   CALCIUM 7.2 (L) 02/17/2021 0614   AST 18 02/17/2021 0614   ALT 9 02/17/2021 0614   ALKPHOS 57 02/17/2021 0614   BILITOT 1.8 (H) 02/17/2021  0614   PROT 5.7 (L) 02/17/2021 0614   ALBUMIN 2.6 (L) 02/17/2021 0614    RADIOGRAPHIC STUDIES: CT CHEST ABDOMEN PELVIS W CONTRAST  Result Date: 02/08/2021 CLINICAL DATA:  Stage IV colon adenocarcinoma with peritoneal carcinomatosis diagnosed in June 2022. Pulmonary embolism on Xarelto. Abdominal pain and clinical decline. EXAM: CT CHEST, ABDOMEN, AND PELVIS WITH CONTRAST TECHNIQUE: Multidetector CT imaging of the chest, abdomen and pelvis was performed following the standard protocol during bolus administration of intravenous contrast. CONTRAST:  164mL OMNIPAQUE IOHEXOL 300 MG/ML  SOLN COMPARISON:  Prior CTs 11/23/2020 FINDINGS: CT CHEST FINDINGS Cardiovascular: Study was not performed as a dedicated CTA, although there is adequate opacification of the pulmonary arteries, and no recurrent pulmonary emboli are demonstrated. Mild atherosclerosis of the aorta and coronary arteries. A right IJ Port-A-Cath extends to the superior cavoatrial junction. The heart size is normal. There is no pericardial effusion. Mediastinum/Nodes: There are no enlarged mediastinal, hilar or axillary lymph nodes. Suspected previous thyroidectomy. The trachea and esophagus appear unremarkable. Lungs/Pleura: Enlarging bilateral pleural effusions, now moderate. No definite pleural base masses or abnormal enhancement. There is minimal nodularity along the right major fissure which is similar to previous study. There is compressive atelectasis dependently in both lungs. No suspicious pulmonary nodules. Musculoskeletal/Chest wall: Grossly stable bilateral breast masses, without significant hypermetabolic activity on PET-CT 08/25/2020. No other chest wall lesion or suspicious osseous findings. CT ABDOMEN AND PELVIS FINDINGS Hepatobiliary: The liver is normal in density without suspicious focal abnormality. No evidence of gallstones, gallbladder wall thickening or biliary dilatation. Pancreas: Unremarkable. No pancreatic ductal dilatation  or surrounding inflammatory changes. Spleen: Normal in size without focal abnormality. Adrenals/Urinary Tract: Both adrenal glands appear normal. Stable small cyst in the interpolar region of the right kidney. No evidence of enhancing renal mass, urinary tract calculus or hydronephrosis.  The bladder appears unremarkable for its degree of distention. Stomach/Bowel: Enteric contrast was administered and has passed into the distal colon. The stomach appears unremarkable for its degree of distention. Mild wall thickening of the ascending and transverse colon appears improved. No small bowel wall thickening or distention. Vascular/Lymphatic: There are no enlarged abdominal or pelvic lymph nodes. Aortic and branch vessel atherosclerosis without acute vascular findings. The portal, superior mesenteric and splenic veins are patent. Reproductive: The uterus and adnexal regions appear unchanged. Probable pelvic floor laxity. Other: Overall volume of ascites is stable to minimally increased from the previous study. Nodularity previously seen within the omentum has improved, with a 9 mm nodule in the gastrocolic ligament on image 73/2 (previously 13 mm). No new or enlarging nodules are identified. Musculoskeletal: No acute or significant osseous findings. Lumbar spine facet arthropathy. IMPRESSION: 1. Further improvement in previously demonstrated omental nodularity consistent with improving carcinomatosis. Overall volume of abdominal ascites is stable to slightly increased. 2. Enlarging moderate-sized bilateral pleural effusions without definite pleural base nodularity. 3. No evidence of solid visceral organ, pulmonary or osseous metastatic disease. 4. Stable bilateral breast nodules. Electronically Signed   By: Richardean Sale M.D.   On: 02/08/2021 14:42   DG Chest Portable 1 View  Result Date: 02/16/2021 CLINICAL DATA:  Shortness of breath. EXAM: PORTABLE CHEST 1 VIEW COMPARISON:  Chest x-ray dated September 22, 2020  FINDINGS: Right chest wall port is unchanged in position. Cardiac and mediastinal contours are unchanged and within normal limits for AP technique. Hazy opacities of the bilateral lower lungs, likely due to layering pleural effusions. Additional bilateral linear opacities are seen which are likely due to atelectasis. No evidence of pneumothorax. IMPRESSION: Atelectasis with probable layering bilateral pleural effusions. Electronically Signed   By: Yetta Glassman M.D.   On: 02/16/2021 16:24   DG Abd 2 Views  Result Date: 02/16/2021 CLINICAL DATA:  Constipation, decreased ambulation EXAM: ABDOMEN - 2 VIEW COMPARISON:  CT 02/08/2021 FINDINGS: Right lung base clear.  Layering left pleural effusion. No free air. Normal bowel gas pattern. No abnormal abdominal calcifications. Regional bones unremarkable. IMPRESSION: 1. Nonobstructive bowel gas pattern. 2. Layering left pleural effusion. Electronically Signed   By: Lucrezia Europe M.D.   On: 02/16/2021 12:35    PERFORMANCE STATUS (ECOG) : 4 - Bedbound  Review of Systems Unless otherwise noted, a complete review of systems is negative.  Physical Exam General: NAD Cardiovascular: regular rate and rhythm Pulmonary: clear ant fields Abdomen: soft, nontender, + bowel sounds GU: no suprapubic tenderness Extremities: no edema, no joint deformities Skin: no rashes Neurological: Weakness but otherwise nonfocal  IMPRESSION: I met with patient and sister and then spoke with patient's son by phone.  Patient has been doing poorly over the past several weeks with declining performance status and minimal oral intake despite trials of appetite stimulants.  She has essentially become bedbound and reliant on family for ADLs.  Family has been struggling at home with her care.  Patient last received cycle 8 FOLFIRI plus Avastin on December 22, 2020.  Treatment has subsequently been held given declining status.  It seems unlikely that patient would tolerate further  treatment at this point.  I had a frank discussion with patient and family regarding her declining status and the fact that she is likely nearing end-of-life unless she significantly improved with oral intake.  I do not think the patient would be a viable rehab candidate.  Instead, I would recommend hospice involvement either at home or  a hospice facility.  We also discussed CODE STATUS at length.  I strongly recommended DNR/DNI.  Patient's son would like time to reflect on this discussion and talk with his family about decision-making.  We agreed to talk later today.  PLAN: -Recommend best supportive care -Recommend DNR and hospice involvement either at home or hospice facility -Patient and family discussing options  Case and plan discussed with Drs. Grayland Ormond and Manuella Ghazi   Time Total: 60 minutes  Visit consisted of counseling and education dealing with the complex and emotionally intense issues of symptom management and palliative care in the setting of serious and potentially life-threatening illness.Greater than 50%  of this time was spent counseling and coordinating care related to the above assessment and plan.  Signed by: Altha Harm, PhD, NP-C

## 2021-02-17 NOTE — Assessment & Plan Note (Signed)
Controlled for now.  Continue metoprolol

## 2021-02-17 NOTE — ED Notes (Signed)
Partial linen change; new adult brief; pure wick repositioned; new gown & warm blankets applied; assistance by RN Sara Chu

## 2021-02-17 NOTE — ED Notes (Signed)
Informed RN bed assigned 

## 2021-02-17 NOTE — Assessment & Plan Note (Signed)
Continue metoprolol for rate control and Xarelto for anticoagulation

## 2021-02-17 NOTE — Progress Notes (Signed)
  Progress Note    Judith Ross   AFB:[id number]  DOB: January 17, 1956  DOA: 02/16/2021     1 Date of Service: 02/17/2021   Clinical Course  65 year old female with a known history of metastatic colon cancer, peritoneal carcinomatosis is admitted for generalized weakness and adult failure to thrive  12/1: Patient leaning towards comfort care/hospice   Assessment and Plan * Hypokalemia Replete and recheck  Palliative care encounter Patient leaning towards comfort care/hospice.  Palliative care/Josh had discussion with patient and family.  Generalized weakness Multifactorial.  PT, OT eval depending on goals of care discussion  Atrial fibrillation with RVR (HCC) Continue metoprolol for rate control and Xarelto for anticoagulation  Hypothyroidism Continue levothyroxine  Essential hypertension Controlled for now.  Continue metoprolol     Subjective:   Feeling very weak and tired.  Wants to stay comfortable in her own home if possible.  She is being tired of going through chemo and cancer treatment  Objective Vitals:   02/17/21 0600 02/17/21 0630 02/17/21 0700 02/17/21 0730  BP: (!) 149/103 (!) 144/83 134/80 (!) 149/97  Pulse: 86 89 88 85  Resp: 16 19 18 18   Temp:      TempSrc:      SpO2: 97% 98% 96% 97%  Weight:      Height:       72.6 kg  Vital signs were reviewed and unremarkable.   Exam Physical Exam   Eyes: PERRL, lids and conjunctivae normal ENMT: Mucous membranes are moist. Posterior pharynx without exudate.  Normal dentition.  Neck: normal, supple, no masses, no thyromegaly Respiratory: Clear to auscultation bilaterally, respiratory effort normal, no sensory muscle use.  Chemo-Port noted. Cardiovascular: S1-S2 heard, regular rate and rhythm, no murmur. Abdomen: Soft, mildly tender, nondistended, BS + musculoskeletal: no clubbing / cyanosis. No joint deformity upper and lower extremities. Good ROM, no contractures. Normal muscle tone.  Skin: no  rashes, lesions, ulcers. No induration Neurologic: CN 2-12 grossly intact. Sensation intact, DTR normal. Strength 4/5 in all 4.  Psychiatric: Normal judgment and insight. Alert and oriented x 3. Normal mood.   Labs / Other Information My review of labs, imaging, notes and other tests is significant for Low potassium and albumin     Disposition Plan: Status is: Inpatient  Remains inpatient appropriate because: Working on goals of care and decision for comfort care/hospice.  Aggressively replete electrolytes        Time spent: 35 minutes [facility] 02/17/2021, 2:48 PM

## 2021-02-18 ENCOUNTER — Inpatient Hospital Stay: Payer: Medicare Other

## 2021-02-18 DIAGNOSIS — E890 Postprocedural hypothyroidism: Secondary | ICD-10-CM

## 2021-02-18 DIAGNOSIS — I4891 Unspecified atrial fibrillation: Secondary | ICD-10-CM

## 2021-02-18 DIAGNOSIS — C184 Malignant neoplasm of transverse colon: Secondary | ICD-10-CM

## 2021-02-18 DIAGNOSIS — D329 Benign neoplasm of meninges, unspecified: Secondary | ICD-10-CM

## 2021-02-18 DIAGNOSIS — Z515 Encounter for palliative care: Secondary | ICD-10-CM | POA: Diagnosis not present

## 2021-02-18 DIAGNOSIS — E876 Hypokalemia: Secondary | ICD-10-CM | POA: Diagnosis not present

## 2021-02-18 LAB — BASIC METABOLIC PANEL
Anion gap: 11 (ref 5–15)
BUN: 13 mg/dL (ref 8–23)
CO2: 29 mmol/L (ref 22–32)
Calcium: 7.1 mg/dL — ABNORMAL LOW (ref 8.9–10.3)
Chloride: 104 mmol/L (ref 98–111)
Creatinine, Ser: 0.57 mg/dL (ref 0.44–1.00)
GFR, Estimated: >60 mL/min (ref >60)
Glucose, Bld: 77 mg/dL (ref 70–99)
Potassium: 2.5 mmol/L — CL (ref 3.5–5.1)
Sodium: 144 mmol/L (ref 135–145)

## 2021-02-18 LAB — CBC
HCT: 31.2 % — ABNORMAL LOW (ref 36.0–46.0)
Hemoglobin: 10 g/dL — ABNORMAL LOW (ref 12.0–15.0)
MCH: 29.1 pg (ref 26.0–34.0)
MCHC: 32.1 g/dL (ref 30.0–36.0)
MCV: 90.7 fL (ref 80.0–100.0)
Platelets: 248 10*3/uL (ref 150–400)
RBC: 3.44 MIL/uL — ABNORMAL LOW (ref 3.87–5.11)
RDW: 22.6 % — ABNORMAL HIGH (ref 11.5–15.5)
WBC: 8.4 10*3/uL (ref 4.0–10.5)
nRBC: 0.4 % — ABNORMAL HIGH (ref 0.0–0.2)

## 2021-02-18 LAB — POTASSIUM: Potassium: 3.2 mmol/L — ABNORMAL LOW (ref 3.5–5.1)

## 2021-02-18 LAB — GLUCOSE, CAPILLARY
Glucose-Capillary: 72 mg/dL (ref 70–99)
Glucose-Capillary: 78 mg/dL (ref 70–99)

## 2021-02-18 MED ORDER — MORPHINE SULFATE (PF) 2 MG/ML IV SOLN: 2.0000 mg | INTRAVENOUS | Status: DC | PRN

## 2021-02-18 MED ORDER — POLYVINYL ALCOHOL 1.4 % OP SOLN
1.0000 [drp] | Freq: Four times a day (QID) | OPHTHALMIC | Status: DC | PRN
  Filled 2021-02-18: qty 15

## 2021-02-18 MED ORDER — SODIUM CHLORIDE 0.9% FLUSH
10.0000 mL | Freq: Two times a day (BID) | INTRAVENOUS | Status: DC
  Administered 2021-02-19: 10 mL

## 2021-02-18 MED ORDER — SODIUM CHLORIDE 0.9% FLUSH: 10.0000 mL | INTRAVENOUS | Status: DC | PRN

## 2021-02-18 MED ORDER — POLYETHYLENE GLYCOL 3350 17 G PO PACK: 17.0000 g | PACK | Freq: Every day | ORAL | Status: DC | PRN

## 2021-02-18 MED ORDER — LORAZEPAM 2 MG/ML IJ SOLN
2.0000 mg | INTRAMUSCULAR | Status: DC | PRN
  Administered 2021-02-21: 2 mg via INTRAVENOUS
  Filled 2021-02-18: qty 1

## 2021-02-18 MED ORDER — MELATONIN 5 MG PO TABS
5.0000 mg | ORAL_TABLET | Freq: Once | ORAL | Status: AC
  Administered 2021-02-18: 01:00:00 5 mg via ORAL
  Filled 2021-02-18: qty 1

## 2021-02-18 MED ORDER — POTASSIUM CHLORIDE CRYS ER 20 MEQ PO TBCR
40.0000 meq | EXTENDED_RELEASE_TABLET | Freq: Once | ORAL | Status: AC
  Administered 2021-02-18: 06:00:00 40 meq via ORAL
  Filled 2021-02-18: qty 2

## 2021-02-18 MED ORDER — POTASSIUM CHLORIDE 10 MEQ/100ML IV SOLN
10.0000 meq | INTRAVENOUS | Status: AC
  Administered 2021-02-18 (×4): 10 meq via INTRAVENOUS
  Filled 2021-02-18 (×4): qty 100

## 2021-02-18 MED ORDER — GLYCOPYRROLATE 0.2 MG/ML IJ SOLN: 0.2000 mg | INTRAMUSCULAR | Status: DC | PRN

## 2021-02-18 MED ORDER — GLYCOPYRROLATE 1 MG PO TABS
1.0000 mg | ORAL_TABLET | ORAL | Status: DC | PRN
  Filled 2021-02-18: qty 1

## 2021-02-18 MED ORDER — DEXTROSE 5 % IV SOLN: INTRAVENOUS | Status: DC

## 2021-02-18 MED ORDER — BOOST / RESOURCE BREEZE PO LIQD CUSTOM
1.0000 | Freq: Three times a day (TID) | ORAL | Status: DC
  Administered 2021-02-18 – 2021-02-21 (×9): 1 via ORAL

## 2021-02-18 NOTE — Care Management Important Message (Signed)
Important Message  Patient Details  Name: [name] MRN: [id number] Date of Birth: 1955-10-15   Medicare Important Message Given:  N/A - LOS <3 / Initial given by admissions     Juliann Pulse A Masha Orbach 02/18/2021, 8:05 AM

## 2021-02-18 NOTE — Progress Notes (Signed)
   02/18/21 1315  Clinical Encounter Type  Visited With Patient  Visit Type Initial;Spiritual support  Referral From Nurse  Consult/Referral To Chaplain   Chaplain visited PT at bedside, and prayed as requested. Chaplain ministered with compassionate presence. PT was not able to communicate at the moment and seemed lethargic. No family presence.   Andee Poles, M. Div.

## 2021-02-18 NOTE — Progress Notes (Signed)
Checked in on patient who was being cared for by staff, will ask overnight chaplain to follow up.

## 2021-02-18 NOTE — Progress Notes (Signed)
Nutrition Brief Note  Chart reviewed. Patient screened for Malnutrition Screening Tool (MST).  Pt now transitioning to home hospice care for end-of-life/comfort care per Hospice and Palliative Care note.  No nutrition interventions planned at this time.   Please consult if/as needed.   Derrel Nip, RD, LDN (she/her/hers) Clinical Inpatient Dietitian RD Pager/After-Hours/Weekend Pager # in [place]

## 2021-02-18 NOTE — TOC Progression Note (Signed)
Transition of Care [facility]) - Progression Note    Patient Details  Name: Judith Ross MRN: [id number] Date of Birth: Feb 27, 1956  [facility] CM/SW Contact  Shelbie Hutching, RN Phone Number: 02/18/2021, 4:55 PM  Clinical Narrative:    Patient has been accepted at the [facility], no bed available today.    Expected Discharge Plan: [place] Barriers to Discharge: Hospice Bed not available, Continued Medical Work up  Expected Discharge Plan and Services Expected Discharge Plan: [place]   Discharge Planning Services: CM Consult Post Acute Care Choice: Residential Hospice Bed Living arrangements for the past 2 months: Single Family Home                 DME Arranged: N/A DME Agency: NA       HH Arranged: NA HH Agency: NA         Social Determinants of Health (SDOH) Interventions    Readmission Risk Interventions No flowsheet data found.

## 2021-02-18 NOTE — Assessment & Plan Note (Signed)
Multifactorial.  Comfort care now.  Waiting for hospice home bed

## 2021-02-18 NOTE — Progress Notes (Signed)
NP Randol Kern made aware of critical K+ 2.5, acknowledged

## 2021-02-18 NOTE — Progress Notes (Signed)
[facility]) Hospital Liaison note:  This is a pending outpatient-based Palliative Care patient. Will continue to follow for disposition.  Please call with any outpatient palliative questions or concerns.  Thank you, Lorelee Market, LPN [facility] Liaison [phone]

## 2021-02-18 NOTE — Assessment & Plan Note (Signed)
Comfort care now.  Waiting for hospice home bed

## 2021-02-18 NOTE — Progress Notes (Signed)
[place] at [facility] [phone] [phone]   Name: Judith Ross Date: 02/18/2021 MRN: [id number]  DOB: Dec 24, 1955  Patient Care Team: Venia Carbon, MD as PCP - General (Internal Medicine) Kate Sable, MD as PCP - Cardiology (Cardiology) Clent Jacks, RN as Oncology Nurse Navigator Grayland Ormond, Kathlene November, MD as Consulting Physician (Hematology and Oncology)    REASON FOR CONSULTATION: Judith Ross is a 65 y.o. 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  Patient was hospitalized 10/72/22-01/11/2021 with A. fib with RVR and persistent hypokalemia.  CT of the chest, abdomen, and pelvis on 02/06/2021 revealed interval improvement with improving carcinomatosis and no evidence of solid visceral organ, pulmonary, or osseous metastatic disease.  Unfortunately, patient has had declining performance status with minimal oral intake.  She was readmitted to the hospital 02/16/2021 with severe hypokalemia and dehydration.  Palliative care was consulted to address goals and manage ongoing symptoms.. .    CODE STATUS: DNR  PAST MEDICAL HISTORY: Past Medical History:  Diagnosis Date   Arthritis    Chicken pox    Helicobacter pylori gastritis    Hyperlipidemia    Hypertension    Metastatic colon cancer in female [facility])    Obesity    Sleep apnea    Thyroid disease     PAST SURGICAL HISTORY:  Past Surgical History:  Procedure Laterality Date   ABDOMINAL HYSTERECTOMY  12/2009   total   COLONOSCOPY WITH PROPOFOL N/A 08/13/2020   Procedure: COLONOSCOPY WITH PROPOFOL;  Surgeon: Jonathon Bellows, MD;  Location: [facility] ENDOSCOPY;  Service: Gastroenterology;  Laterality: N/A;   ESOPHAGOGASTRODUODENOSCOPY (EGD) WITH PROPOFOL N/A 08/13/2020   Procedure: ESOPHAGOGASTRODUODENOSCOPY (EGD) WITH PROPOFOL;   Surgeon: Jonathon Bellows, MD;  Location: [facility] ENDOSCOPY;  Service: Gastroenterology;  Laterality: N/A;   IR IMAGING GUIDED PORT INSERTION  08/27/2020   supracervical abdominal hysterectomy and bilateral salpingo--oophorectomy 01-17-2010 for fibroids  01/17/2010   TOTAL THYROIDECTOMY  1991-92   TUBAL LIGATION      HEMATOLOGY/ONCOLOGY HISTORY:  Oncology History  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[place])  08/18/2020 Initial Diagnosis   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[place])   08/18/2020 Cancer Staging   Staging form: Colon and Rectum - Neuroendocine Tumors, AJCC 8th Edition - Clinical stage from 08/18/2020: Stage IV (cTX, cNX, pM1b) - Signed by Lloyd Huger, MD on 08/18/2020 Stage prefix: Initial diagnosis    08/31/2020 - 09/16/2020 Chemotherapy          10/05/2020 -  Chemotherapy   Patient is on Treatment Plan : COLORECTAL FOLFIRI / BEVACIZUMAB Q14D       ALLERGIES:  is allergic to erythromycin.  MEDICATIONS:  Current Facility-Administered Medications  Medication Dose Route Frequency Provider Last Rate Last Admin   acetaminophen (TYLENOL) tablet 650 mg  650 mg Oral Q6H PRN Shawna Clamp, MD       Or   acetaminophen (TYLENOL) suppository 650 mg  650 mg Rectal Q6H PRN Shawna Clamp, MD       cefTRIAXone (ROCEPHIN) 1 g in sodium chloride 0.9 % 100 mL IVPB  1 g Intravenous Q24H Shawna Clamp, MD   Stopped at 02/17/21 2034   Chlorhexidine Gluconate Cloth 2 % PADS 6 each  6 each Topical Daily Val Riles, MD       dexamethasone (DECADRON) tablet 2 mg  2 mg Oral Daily Shawna Clamp, MD   2 mg  at 02/18/21 1032   docusate sodium (COLACE) capsule 100 mg  100 mg Oral BID Shawna Clamp, MD   100 mg at 02/18/21 1032   feeding supplement (BOOST / RESOURCE BREEZE) liquid 1 Container  1 Container Oral TID BM Max Sane, MD       influenza vaccine adjuvanted (FLUAD) injection 0.5 mL  0.5 mL Intramuscular Tomorrow-1000 Shawna Clamp, MD       levothyroxine (SYNTHROID) tablet 150 mcg  150 mcg  Oral Daily Shawna Clamp, MD   150 mcg at 02/18/21 0456   metoprolol succinate (TOPROL-XL) 24 hr tablet 25 mg  25 mg Oral Daily Shawna Clamp, MD   25 mg at 02/18/21 0948   mirtazapine (REMERON) tablet 7.5 mg  7.5 mg Oral QHS Shawna Clamp, MD   7.5 mg at 02/17/21 2001   ondansetron (ZOFRAN) tablet 4 mg  4 mg Oral Q6H PRN Shawna Clamp, MD       Or   ondansetron [facility]) injection 4 mg  4 mg Intravenous Q6H PRN Shawna Clamp, MD       pantoprazole (PROTONIX) EC tablet 40 mg  40 mg Oral BID Shawna Clamp, MD   40 mg at 02/18/21 1032   prochlorperazine (COMPAZINE) tablet 10 mg  10 mg Oral BID PRN Shawna Clamp, MD       rivaroxaban Alveda Reasons) tablet 20 mg  20 mg Oral Daily Shawna Clamp, MD   20 mg at 02/18/21 1032    VITAL SIGNS: BP (!) 156/102 (BP Location: Left Wrist)   Pulse 78   Temp 98 F (36.7 C) (Oral)   Resp 16   Ht 5\' 6"  (1.676 m)   Wt 160 lb (72.6 kg)   SpO2 100%   BMI 25.82 kg/m  Filed Weights   02/16/21 1135  Weight: 160 lb (72.6 kg)    Estimated body mass index is 25.82 kg/m as calculated from the following:   Height as of this encounter: 5\' 6"  (1.676 m).   Weight as of this encounter: 160 lb (72.6 kg).  LABS: CBC:    Component Value Date/Time   WBC 8.4 02/18/2021 0457   HGB 10.0 (L) 02/18/2021 0457   HCT 31.2 (L) 02/18/2021 0457   PLT 248 02/18/2021 0457   PLT 554 (H) 07/30/2020 1449   MCV 90.7 02/18/2021 0457   NEUTROABS 5.0 02/08/2021 1024   LYMPHSABS 2.2 02/08/2021 1024   MONOABS 0.6 02/08/2021 1024   EOSABS 0.1 02/08/2021 1024   BASOSABS 0.0 02/08/2021 1024   Comprehensive Metabolic Panel:    Component Value Date/Time   NA 144 02/18/2021 0457   K 2.5 (LL) 02/18/2021 0457   CL 104 02/18/2021 0457   CO2 29 02/18/2021 0457   BUN 13 02/18/2021 0457   CREATININE 0.57 02/18/2021 0457   GLUCOSE 77 02/18/2021 0457   CALCIUM 7.1 (L) 02/18/2021 0457   AST 18 02/17/2021 0614   ALT 9 02/17/2021 0614   ALKPHOS 57 02/17/2021 0614   BILITOT 1.8  (H) 02/17/2021 0614   PROT 5.7 (L) 02/17/2021 0614   ALBUMIN 2.6 (L) 02/17/2021 0614    RADIOGRAPHIC STUDIES: CT CHEST ABDOMEN PELVIS W CONTRAST  Result Date: 02/08/2021 CLINICAL DATA:  Stage IV colon adenocarcinoma with peritoneal carcinomatosis diagnosed in June 2022. Pulmonary embolism on Xarelto. Abdominal pain and clinical decline. EXAM: CT CHEST, ABDOMEN, AND PELVIS WITH CONTRAST TECHNIQUE: Multidetector CT imaging of the chest, abdomen and pelvis was performed following the standard protocol during bolus administration of intravenous contrast. CONTRAST:  151mL  OMNIPAQUE IOHEXOL 300 MG/ML  SOLN COMPARISON:  Prior CTs 11/23/2020 FINDINGS: CT CHEST FINDINGS Cardiovascular: Study was not performed as a dedicated CTA, although there is adequate opacification of the pulmonary arteries, and no recurrent pulmonary emboli are demonstrated. Mild atherosclerosis of the aorta and coronary arteries. A right IJ Port-A-Cath extends to the superior cavoatrial junction. The heart size is normal. There is no pericardial effusion. Mediastinum/Nodes: There are no enlarged mediastinal, hilar or axillary lymph nodes. Suspected previous thyroidectomy. The trachea and esophagus appear unremarkable. Lungs/Pleura: Enlarging bilateral pleural effusions, now moderate. No definite pleural base masses or abnormal enhancement. There is minimal nodularity along the right major fissure which is similar to previous study. There is compressive atelectasis dependently in both lungs. No suspicious pulmonary nodules. Musculoskeletal/Chest wall: Grossly stable bilateral breast masses, without significant hypermetabolic activity on PET-CT 08/25/2020. No other chest wall lesion or suspicious osseous findings. CT ABDOMEN AND PELVIS FINDINGS Hepatobiliary: The liver is normal in density without suspicious focal abnormality. No evidence of gallstones, gallbladder wall thickening or biliary dilatation. Pancreas: Unremarkable. No pancreatic  ductal dilatation or surrounding inflammatory changes. Spleen: Normal in size without focal abnormality. Adrenals/Urinary Tract: Both adrenal glands appear normal. Stable small cyst in the interpolar region of the right kidney. No evidence of enhancing renal mass, urinary tract calculus or hydronephrosis. The bladder appears unremarkable for its degree of distention. Stomach/Bowel: Enteric contrast was administered and has passed into the distal colon. The stomach appears unremarkable for its degree of distention. Mild wall thickening of the ascending and transverse colon appears improved. No small bowel wall thickening or distention. Vascular/Lymphatic: There are no enlarged abdominal or pelvic lymph nodes. Aortic and branch vessel atherosclerosis without acute vascular findings. The portal, superior mesenteric and splenic veins are patent. Reproductive: The uterus and adnexal regions appear unchanged. Probable pelvic floor laxity. Other: Overall volume of ascites is stable to minimally increased from the previous study. Nodularity previously seen within the omentum has improved, with a 9 mm nodule in the gastrocolic ligament on image 73/2 (previously 13 mm). No new or enlarging nodules are identified. Musculoskeletal: No acute or significant osseous findings. Lumbar spine facet arthropathy. IMPRESSION: 1. Further improvement in previously demonstrated omental nodularity consistent with improving carcinomatosis. Overall volume of abdominal ascites is stable to slightly increased. 2. Enlarging moderate-sized bilateral pleural effusions without definite pleural base nodularity. 3. No evidence of solid visceral organ, pulmonary or osseous metastatic disease. 4. Stable bilateral breast nodules. Electronically Signed   By: Richardean Sale M.D.   On: 02/08/2021 14:42   DG Chest Portable 1 View  Result Date: 02/16/2021 CLINICAL DATA:  Shortness of breath. EXAM: PORTABLE CHEST 1 VIEW COMPARISON:  Chest x-ray dated  September 22, 2020 FINDINGS: Right chest wall port is unchanged in position. Cardiac and mediastinal contours are unchanged and within normal limits for AP technique. Hazy opacities of the bilateral lower lungs, likely due to layering pleural effusions. Additional bilateral linear opacities are seen which are likely due to atelectasis. No evidence of pneumothorax. IMPRESSION: Atelectasis with probable layering bilateral pleural effusions. Electronically Signed   By: Yetta Glassman M.D.   On: 02/16/2021 16:24   DG Abd 2 Views  Result Date: 02/16/2021 CLINICAL DATA:  Constipation, decreased ambulation EXAM: ABDOMEN - 2 VIEW COMPARISON:  CT 02/08/2021 FINDINGS: Right lung base clear.  Layering left pleural effusion. No free air. Normal bowel gas pattern. No abnormal abdominal calcifications. Regional bones unremarkable. IMPRESSION: 1. Nonobstructive bowel gas pattern. 2. Layering left pleural effusion. Electronically Signed  By: Lucrezia Europe M.D.   On: 02/16/2021 12:35    PERFORMANCE STATUS (ECOG) : 4 - Bedbound  Review of Systems Unable to provide  Physical Exam General: Frail appearing Pulmonary: Unlabored Extremities: no edema, no joint deformities Skin: no rashes Neurological: Weakness, drowsiness, responds with one-word answers  IMPRESSION: Patient is without significant clinical improvement.  She has significantly declined over the past several weeks.  Oral intake is minimal.  Patient eating only bites and drinking only sips.  I called and spoke with patient's cousin, Silva Bandy, who has been patient's primary caregiver both at home and facilitating cancer treatment at the [facility].  Silva Bandy says that she recognizes that patient is nearing end-of-life.  She said that patient was "transitioning".  She says that she spoke with patient's son and other family members and all are in agreement with transferring patient to the Hospice Home for end-of-life/comfort care.  She also says that patient's  son was in agreement with DNR/DNI.  She felt comfortable with me proceeding with this plan.  I do feel like inpatient hospice is clinically appropriate with prognosis likely measured in days to weeks.  I called and left a VM for son.   PLAN: -Best supportive care -Referral for inpatient hospice -DNR  Case and plan discussed with Dr. Grayland Ormond   Time Total: 30 minutes  Visit consisted of counseling and education dealing with the complex and emotionally intense issues of symptom management and palliative care in the setting of serious and potentially life-threatening illness.Greater than 50%  of this time was spent counseling and coordinating care related to the above assessment and plan.  Signed by: Altha Harm, PhD, NP-C

## 2021-02-18 NOTE — Progress Notes (Signed)
[facility]) Hospital Liaison Note  Received request from [place] for family/patient interest in [place] with request for transfer. Patient aware and agreeable to this plan. Liaison left message for patient's son Shelle Iron. [phone] to inform, and spoke with patient's friend Silva Bandy per patients request.  Chart reviewed and eligibility confirmed. Met with patient to confirm interest and explain services. Patient agreeable to transfer whenever bed is available. TOC aware.    Hospice Home is unable to offer a room today. Hospital Liaison will follow up tomorrow or sooner if a room becomes available. Please do not hesitate to call with questions.    Thank you for the opportunity to participate in this patient's care.  [facility] Liaison  [phone]

## 2021-02-18 NOTE — Progress Notes (Signed)
  Progress Note    Judith Ross   OZH:[id number]  DOB: Nov 26, 1955  DOA: 02/16/2021     2 Date of Service: 02/18/2021   Clinical Course  65 year old female with a known history of metastatic colon cancer, peritoneal carcinomatosis is admitted for generalized weakness and adult failure to thrive  12/1: Patient leaning towards comfort care/hospice 12/2: Waiting for hospice home bed   Assessment and Plan * Hypokalemia Replete and recheck.  Comfort care now.  Waiting for hospice home bed  Palliative care encounter Comfort care now.  Waiting for hospice home bed  Generalized weakness Multifactorial.  Comfort care now.  Waiting for hospice home bed  Adenocarcinoma of transverse colon [facility]) Comfort care now.  Waiting for hospice home bed  Hypothyroidism Continue levothyroxine. Comfort care now.  Waiting for hospice home bed  HLD (hyperlipidemia) Comfort care now.  Waiting for hospice home bed  Essential hypertension Comfort care now.  Waiting for hospice home bed     Subjective:  Agreeable with hospice home.  Wants to be comfortable and have peace  Objective Vitals:   02/18/21 0037 02/18/21 0527 02/18/21 0848 02/18/21 1158  BP: 136/79 (!) 142/89 (!) 156/102 (!) 159/87  Pulse: 81 76 78 76  Resp: 18 18 16 18   Temp: 98.4 F (36.9 C) (!) 97.5 F (36.4 C) 98 F (36.7 C) 97.7 F (36.5 C)  TempSrc: Oral Oral Oral Oral  SpO2: 100% 100% 100% 100%  Weight:      Height:       72.6 kg  Vital signs were reviewed and unremarkable except for: Blood pressure: Elevated    Exam Physical Exam   65 year old frail-appearing female lying in the bed comfortably ENMT:Mucous membranes are moist. Posterior pharynx withoutexudate.Normal dentition.  Neck:normal, supple, no masses, no thyromegaly Respiratory:Clear to auscultation bilaterally, respiratory effort normal, no sensory muscle use. Chemo-Port noted. Cardiovascular:S1-S2 heard, regular rate and rhythm, no  murmur. Abdomen:Soft, benign Skin:no rashes, lesions, ulcers. No induration Neurologic:Awake, nonfocal  Labs / Other Information There are no new results to review at this time.   Disposition Plan: Status is: Inpatient  Remains inpatient appropriate because: Waiting for hospice home bed   Comfort care only for now.  Family in agreement for palliative care     Time spent: 15 minutes [facility] 02/18/2021, 2:15 PM

## 2021-02-18 NOTE — TOC Initial Note (Signed)
Transition of Care [facility]) - Initial/Assessment Note    Patient Details  Name: Judith Ross MRN: [id number] Date of Birth: [birth year]  [facility] of [facility]) CM/SW Contact:    Shelbie Hutching, RN Phone Number: 02/18/2021, 11:27 AM  Clinical Narrative:                 Altha Harm with Palliative Medicine is following.  Family is deciding on [place] with [facility].  Sonia Baller with [facility] has been notified to start process for admission to [facility].   Expected Discharge Plan: [place] Barriers to Discharge: Hospice Bed not available, Continued Medical Work up   Patient Goals and CMS Choice Patient states their goals for this hospitalization and ongoing recovery are:: Family is deciding on residential hospice CMS Medicare.gov Compare Post Acute Care list provided to:: Patient Represenative (must comment) Choice offered to / list presented to : Adult Children, Sibling  Expected Discharge Plan and Services Expected Discharge Plan: [place]   Discharge Planning Services: CM Consult Post Acute Care Choice: Residential Hospice Bed Living arrangements for the past 2 months: Single Family Home                 DME Arranged: N/A DME Agency: NA       HH Arranged: NA HH Agency: NA        Prior Living Arrangements/Services Living arrangements for the past 2 months: Single Family Home Lives with:: Relatives Patient language and need for interpreter reviewed:: Yes Do you feel safe going back to the place where you live?: Yes      Need for Family Participation in Patient Care: Yes (Comment) Care giver support system in place?: Yes (comment) (cousing, son,) Current home services: DME Criminal Activity/Legal Involvement Pertinent to Current Situation/Hospitalization: No - Comment as needed  Activities of Daily Living Home Assistive Devices/Equipment: Dentures (specify type), Eyeglasses, Walker (specify type), Wheelchair ADL Screening  (condition at time of admission) Patient's cognitive ability adequate to safely complete daily activities?: Yes Is the patient deaf or have difficulty hearing?: No Does the patient have difficulty seeing, even when wearing glasses/contacts?: No Does the patient have difficulty concentrating, remembering, or making decisions?: Yes Patient able to express need for assistance with ADLs?: Yes Does the patient have difficulty dressing or bathing?: Yes Independently performs ADLs?: No Communication: Independent Dressing (OT): Needs assistance Is this a change from baseline?: Pre-admission baseline Grooming: Needs assistance Is this a change from baseline?: Pre-admission baseline Feeding: Independent Bathing: Needs assistance Is this a change from baseline?: Pre-admission baseline Toileting: Needs assistance Is this a change from baseline?: Pre-admission baseline In/Out Bed: Needs assistance Is this a change from baseline?: Pre-admission baseline Walks in Home: Needs assistance Is this a change from baseline?: Pre-admission baseline Does the patient have difficulty walking or climbing stairs?: Yes Weakness of Legs: Both Weakness of Arms/Hands: Both  Permission Sought/Granted Permission sought to share information with : Case Manager, Customer service manager, Family Supports Permission granted to share information with : Yes, Verbal Permission Granted     Permission granted to share info w AGENCY: [facility]        Emotional Assessment         Alcohol / Substance Use: Not Applicable Psych Involvement: No (comment)  Admission diagnosis:  Hypokalemia [E87.6] Constipation [K59.00] Patient Active Problem List   Diagnosis Date Noted   Palliative care encounter    Thrush 01/18/2021   Generalized weakness 01/18/2021   Atrial fibrillation  with RVR ([place]) 01/04/2021   Atrial fibrillation with rapid ventricular response ([place]) 01/03/2021   Meningioma ([place]) 01/03/2021    Current use of long term anticoagulation 01/03/2021   History of pulmonary embolism 01/03/2021   Hypokalemia 01/03/2021   Diarrhea 01/03/2021   Non-sustained ventricular tachycardia 01/03/2021   Acute deep vein thrombosis (DVT) of right peroneal vein ([place]) 09/28/2020   Chemotherapy induced neutropenia ([place]) 09/28/2020   Acute pulmonary embolism ([place]) 09/18/2020   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[place]) 08/18/2020   Abdominal pain 07/14/2020   Prediabetes 10/08/2014   Essential hypertension 05/11/2014   HLD (hyperlipidemia) 05/11/2014   Hypothyroidism 05/11/2014   Arthritis 05/11/2014   Obstructive sleep apnea 05/11/2014   PCP:  Venia Carbon, MD Pharmacy:   [id number] [address], [place] AT [address] [place] [address] Phone: [phone] Fax: [phone]  [facility] [place], [place] - [address] [place] [address] Phone: [phone] Fax: [phone]     Social Determinants of Health ([place]) Interventions    Readmission Risk Interventions No flowsheet data found.

## 2021-02-18 NOTE — Assessment & Plan Note (Signed)
Continue levothyroxine. Comfort care now.  Waiting for hospice home bed

## 2021-02-18 NOTE — Assessment & Plan Note (Signed)
Replete and recheck.  Comfort care now.  Waiting for hospice home bed

## 2021-02-19 DIAGNOSIS — C189 Malignant neoplasm of colon, unspecified: Secondary | ICD-10-CM | POA: Diagnosis not present

## 2021-02-19 DIAGNOSIS — R627 Adult failure to thrive: Secondary | ICD-10-CM

## 2021-02-19 DIAGNOSIS — R531 Weakness: Secondary | ICD-10-CM | POA: Diagnosis not present

## 2021-02-19 LAB — URINE CULTURE: Culture: >=100000 — AB

## 2021-02-19 NOTE — Progress Notes (Signed)
PROGRESS NOTE    Judith Ross  [id number]:[id number] DOB: 10/15/1955 DOA: 02/16/2021 PCP: Venia Carbon, MD   Assessment & Plan:   Principal Problem:   Hypokalemia Active Problems:   Essential hypertension   HLD (hyperlipidemia)   Hypothyroidism   Obstructive sleep apnea   Prediabetes   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HCC)   Acute pulmonary embolism (HCC)   Chemotherapy induced neutropenia (HCC)   Atrial fibrillation with rapid ventricular response (HCC)   Meningioma (HCC)   Atrial fibrillation with RVR (HCC)   Generalized weakness   Palliative care encounter  Failure to thrive: likely secondary to metastatic colon cancer. Continue w/ comfort care only  Metastatic colon cancer: w/ peritoneal carcinomatosis. Continue w/ comfort care only   Generalized weakness: continue w/ comfort care only  Hypothyroidism: continue comfort care   HLD: continue comfort care   HTN: continue comfort care    DVT prophylaxis: comfort care only  Code Status: DNR Family Communication: called pt's son, [name], but no answer so I left a message  Disposition Plan: waiting on hospice home be.  Level of care: Telemetry Medical   Status is: Inpatient  Remains inpatient appropriate because: waiting on hospice bed     Consultants:  Hospice   Procedures:  Antimicrobials:    Subjective: Pt c/o fatigue   Objective: Vitals:   02/18/21 1158 02/18/21 1723 02/18/21 2056 02/19/21 0446  BP: (!) 159/87 140/89 (!) 148/101 (!) 153/101  Pulse: 76 83 88 79  Resp: 18 16 18 14   Temp: 97.7 F (36.5 C) 98.6 F (37 C) 98.2 F (36.8 C) (!) 97.4 F (36.3 C)  TempSrc: Oral Oral Oral Oral  SpO2: 100% 100% 99% 100%  Weight:      Height:        Intake/Output Summary (Last 24 hours) at 02/19/2021 0725 Last data filed at 02/19/2021 0326 Gross per 24 hour  Intake 642.68 ml  Output --  Net 642.68 ml   Filed Weights   02/16/21 1135  Weight: 72.6 kg    Examination:  General exam:  Appears calm and comfortable  Respiratory system: diminished breath sounds b/l Cardiovascular system: S1 & S2+. No  rubs, gallops or clicks.  Gastrointestinal system: Abdomen is nondistended, soft and nontender. Normal bowel sounds heard. Central nervous system: Alert and oriented. Moves all extremities  Psychiatry: Judgement and insight appear abnormal. Flat mood and affect    Data Reviewed: I have personally reviewed following labs and imaging studies  CBC: Recent Labs  Lab 02/16/21 1137 02/16/21 1230 02/17/21 0614 02/18/21 0457  WBC 8.2 7.1 8.9 8.4  HGB 11.1* 10.9* 11.1* 10.0*  HCT 34.1* 33.6* 35.4* 31.2*  MCV 91.7 91.3 91.2 90.7  PLT 269 258 241 850   Basic Metabolic Panel: Recent Labs  Lab 02/16/21 1230 02/16/21 1518 02/16/21 1918 02/17/21 0614 02/18/21 0457 02/18/21 1548  NA 143  --  141 142 144  --   K 2.0*  --  2.5* 2.8* 2.5* 3.2*  CL 99  --  98 102 104  --   CO2 30  --  30 28 29   --   GLUCOSE 93  --  83 70 77  --   BUN 13  --  13 12 13   --   CREATININE 0.74  --  0.75 0.58 0.57  --   CALCIUM 7.6*  --  7.6* 7.2* 7.1*  --   MG  --  2.1  --  2.1  --   --  PHOS  --   --   --  3.1  --   --    GFR: Estimated Creatinine Clearance: 70.5 mL/min (by C-G formula based on SCr of 0.57 mg/dL). Liver Function Tests: Recent Labs  Lab 02/16/21 1600 02/17/21 0614  AST 19 18  ALT 11 9  ALKPHOS 52 57  BILITOT 2.3* 1.8*  PROT 5.9* 5.7*  ALBUMIN 2.8* 2.6*   No results for input(s): LIPASE, AMYLASE in the last 168 hours. No results for input(s): AMMONIA in the last 168 hours. Coagulation Profile: No results for input(s): INR, PROTIME in the last 168 hours. Cardiac Enzymes: No results for input(s): CKTOTAL, CKMB, CKMBINDEX, TROPONINI in the last 168 hours. BNP (last 3 results) No results for input(s): PROBNP in the last 8760 hours. HbA1C: No results for input(s): HGBA1C in the last 72 hours. CBG: Recent Labs  Lab 02/18/21 1044 02/18/21 1414  GLUCAP 72 78    Lipid Profile: No results for input(s): CHOL, HDL, LDLCALC, TRIG, CHOLHDL, LDLDIRECT in the last 72 hours. Thyroid Function Tests: No results for input(s): TSH, T4TOTAL, FREET4, T3FREE, THYROIDAB in the last 72 hours. Anemia Panel: No results for input(s): VITAMINB12, FOLATE, FERRITIN, TIBC, IRON, RETICCTPCT in the last 72 hours. Sepsis Labs: No results for input(s): PROCALCITON, LATICACIDVEN in the last 168 hours.  Recent Results (from the past 240 hour(s))  Urine Culture     Status: Abnormal (Preliminary result)   Collection Time: 02/16/21  4:11 PM   Specimen: Urine, Clean Catch  Result Value Ref Range Status   Specimen Description   Final    URINE, CLEAN CATCH Performed at [facility], [address] [place], [place] [address]    Special Requests   Final    NONE Performed at [facility], [address] [place], [place] [address]    Culture (A)  Final    >=100,000 COLONIES/mL ESCHERICHIA COLI SUSCEPTIBILITIES TO FOLLOW Performed at [facility], [place] [address], [place],  [address]    Report Status PENDING  Incomplete  Resp Panel by RT-PCR (Flu A&B, Covid) Urine, In & Out Cath     Status: None   Collection Time: 02/16/21  4:46 PM   Specimen: Urine, In & Out Cath; Nasopharyngeal(NP) swabs in vial transport medium  Result Value Ref Range Status   SARS Coronavirus 2 by RT PCR NEGATIVE NEGATIVE Final    Comment: (NOTE) SARS-CoV-2 target nucleic acids are NOT DETECTED.  The SARS-CoV-2 RNA is generally detectable in upper respiratory specimens during the acute phase of infection. The lowest concentration of SARS-CoV-2 viral copies this assay can detect is 138 copies/mL. A negative result does not preclude SARS-Cov-2 infection and should not be used as the sole basis for treatment or other patient management decisions. A negative result may occur with  improper specimen collection/handling, submission of specimen other than nasopharyngeal  swab, presence of viral mutation(s) within the areas targeted by this assay, and inadequate number of viral copies(<138 copies/mL). A negative result must be combined with clinical observations, patient history, and epidemiological information. The expected result is Negative.  Fact Sheet for Patients:  EntrepreneurPulse.com.au  Fact Sheet for Healthcare Providers:  IncredibleEmployment.be  This test is no t yet approved or cleared by the [place] FDA and  has been authorized for detection and/or diagnosis of SARS-CoV-2 by FDA under an Emergency Use Authorization (EUA). This EUA will remain  in effect (meaning this test can be used) for the duration of the COVID-19 declaration  under Section 564(b)(1) of the Act, 21 U.S.C.section 360bbb-3(b)(1), unless the authorization is terminated  or revoked sooner.       Influenza A by PCR NEGATIVE NEGATIVE Final   Influenza B by PCR NEGATIVE NEGATIVE Final    Comment: (NOTE) The Xpert Xpress SARS-CoV-2/FLU/RSV plus assay is intended as an aid in the diagnosis of influenza from Nasopharyngeal swab specimens and should not be used as a sole basis for treatment. Nasal washings and aspirates are unacceptable for Xpert Xpress SARS-CoV-2/FLU/RSV testing.  Fact Sheet for Patients: EntrepreneurPulse.com.au  Fact Sheet for Healthcare Providers: IncredibleEmployment.be  This test is not yet approved or cleared by the [place] FDA and has been authorized for detection and/or diagnosis of SARS-CoV-2 by FDA under an Emergency Use Authorization (EUA). This EUA will remain in effect (meaning this test can be used) for the duration of the COVID-19 declaration under Section 564(b)(1) of the Act, 21 U.S.C. section 360bbb-3(b)(1), unless the authorization is terminated or revoked.  Performed at [facility], [address], [place], [place] [address]           Radiology Studies: No results found.      Scheduled Meds:  Chlorhexidine Gluconate Cloth  6 each Topical Daily   feeding supplement  1 Container Oral TID BM   influenza vaccine adjuvanted  0.5 mL Intramuscular Tomorrow-1000   levothyroxine  150 mcg Oral Daily   mirtazapine  7.5 mg Oral QHS   sodium chloride flush  10-40 mL Intracatheter Q12H   Continuous Infusions:  dextrose 20 mL/hr at 02/19/21 0326     LOS: 3 days    Time spent: 15 mins    Wyvonnia Dusky, MD [facility] Pager [phone]  If 7PM-7AM, please contact night-coverage 02/19/2021, 7:25 AM

## 2021-02-19 NOTE — Progress Notes (Signed)
Pt slept off and on through the night, no complaints

## 2021-02-19 NOTE — TOC Progression Note (Signed)
Transition of Care [facility]) - Progression Note    Patient Details  Name: Judith Ross MRN: [id number] Date of Birth: 12-31-1955  Transition of Care [facility]) CM/SW Contact  Zigmund Daniel Dorian Pod, RN Phone [phone] 02/19/2021, 10:24 AM  Clinical Narrative:    Spoke with hospice house ([facility]) who indicated no beds were available today and Hospice will reach out when beds are available for placement.  TOC will continue to update on placement for this pt.   Expected Discharge Plan: [place] Barriers to Discharge: Hospice Bed not available, Continued Medical Work up  Expected Discharge Plan and Services Expected Discharge Plan: [place]   Discharge Planning Services: CM Consult Post Acute Care Choice: Residential Hospice Bed Living arrangements for the past 2 months: Single Family Home                 DME Arranged: N/A DME Agency: NA       HH Arranged: NA HH Agency: NA         Social Determinants of Health (SDOH) Interventions    Readmission Risk Interventions No flowsheet data found.

## 2021-02-20 DIAGNOSIS — R627 Adult failure to thrive: Secondary | ICD-10-CM | POA: Diagnosis not present

## 2021-02-20 DIAGNOSIS — C189 Malignant neoplasm of colon, unspecified: Secondary | ICD-10-CM | POA: Diagnosis not present

## 2021-02-20 DIAGNOSIS — R531 Weakness: Secondary | ICD-10-CM | POA: Diagnosis not present

## 2021-02-20 MED ORDER — LORAZEPAM 2 MG/ML IJ SOLN: 2.0000 mg | INTRAMUSCULAR | 0 refills | Status: AC | PRN

## 2021-02-20 MED ORDER — MORPHINE SULFATE (PF) 2 MG/ML IV SOLN: 2.0000 mg | INTRAVENOUS | 0 refills | Status: AC | PRN

## 2021-02-20 NOTE — Progress Notes (Signed)
PROGRESS NOTE    Judith Ross  OYD:[id number] DOB: 19-Aug-1955 DOA: 02/16/2021 PCP: Judith Carbon, MD   Assessment & Plan:   Principal Problem:   Hypokalemia Active Problems:   Essential hypertension   HLD (hyperlipidemia)   Hypothyroidism   Obstructive sleep apnea   Prediabetes   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HCC)   Acute pulmonary embolism (HCC)   Chemotherapy induced neutropenia (HCC)   Atrial fibrillation with rapid ventricular response (HCC)   Meningioma (HCC)   Atrial fibrillation with RVR (HCC)   Generalized weakness   Palliative care encounter  Failure to thrive: likely secondary to metastatic colon cancer. Continue w/ comfort care only   Metastatic colon cancer: w/ peritoneal carcinomatosis. Continue w/ comfort care only   Generalized weakness: continue w/ comfort care   Hypothyroidism: continue comfort care   HLD: continue comfort care   HTN: continue comfort care    DVT prophylaxis: comfort care only  Code Status: DNR Family Communication: discussed pt's care w/ pt's son, Judith Ross, and answered his questions  Disposition Plan:  waiting on hospice bed to become available   Level of care: Telemetry Medical   Status is: Inpatient  Remains inpatient appropriate because: waiting on hospice bed     Consultants:  Hospice   Procedures:  Antimicrobials:    Subjective: Pt denies any complaints   Objective: Vitals:   02/18/21 2056 02/19/21 0446 02/19/21 0747 02/19/21 2041  BP: (!) 148/101 (!) 153/101 (!) 149/88 (!) 153/108  Pulse: 88 79 79 86  Resp: 18 14 14    Temp: 98.2 F (36.8 C) (!) 97.4 F (36.3 C) 97.9 F (36.6 C) 98.3 F (36.8 C)  TempSrc: Oral Oral Oral Oral  SpO2: 99% 100% 100% 98%  Weight:      Height:        Intake/Output Summary (Last 24 hours) at 02/20/2021 0726 Last data filed at 02/20/2021 0538 Gross per 24 hour  Intake 763.78 ml  Output 450 ml  Net 313.78 ml   Filed Weights   02/16/21 1135  Weight:  72.6 kg    Examination:  General exam: Appears comfortable  Respiratory system: decreased breath sounds b/l  Cardiovascular system: S1/S2+. No rubs or clicks  Gastrointestinal system: Abd is soft, NT, ND & hypoactive bowel sounds  Central nervous system: moves all extremities  Psychiatry: Judgement and insight appear abnormal. Flat mood and affect    Data Reviewed: I have personally reviewed following labs and imaging studies  CBC: Recent Labs  Lab 02/16/21 1137 02/16/21 1230 02/17/21 0614 02/18/21 0457  WBC 8.2 7.1 8.9 8.4  HGB 11.1* 10.9* 11.1* 10.0*  HCT 34.1* 33.6* 35.4* 31.2*  MCV 91.7 91.3 91.2 90.7  PLT 269 258 241 672   Basic Metabolic Panel: Recent Labs  Lab 02/16/21 1230 02/16/21 1518 02/16/21 1918 02/17/21 0614 02/18/21 0457 02/18/21 1548  NA 143  --  141 142 144  --   K 2.0*  --  2.5* 2.8* 2.5* 3.2*  CL 99  --  98 102 104  --   CO2 30  --  30 28 29   --   GLUCOSE 93  --  83 70 77  --   BUN 13  --  13 12 13   --   CREATININE 0.74  --  0.75 0.58 0.57  --   CALCIUM 7.6*  --  7.6* 7.2* 7.1*  --   MG  --  2.1  --  2.1  --   --  PHOS  --   --   --  3.1  --   --    GFR: Estimated Creatinine Clearance: 70.5 mL/min (by C-G formula based on SCr of 0.57 mg/dL). Liver Function Tests: Recent Labs  Lab 02/16/21 1600 02/17/21 0614  AST 19 18  ALT 11 9  ALKPHOS 52 57  BILITOT 2.3* 1.8*  PROT 5.9* 5.7*  ALBUMIN 2.8* 2.6*   No results for input(s): LIPASE, AMYLASE in the last 168 hours. No results for input(s): AMMONIA in the last 168 hours. Coagulation Profile: No results for input(s): INR, PROTIME in the last 168 hours. Cardiac Enzymes: No results for input(s): CKTOTAL, CKMB, CKMBINDEX, TROPONINI in the last 168 hours. BNP (last 3 results) No results for input(s): PROBNP in the last 8760 hours. HbA1C: No results for input(s): HGBA1C in the last 72 hours. CBG: Recent Labs  Lab 02/18/21 1044 02/18/21 1414  GLUCAP 72 78   Lipid Profile: No  results for input(s): CHOL, HDL, LDLCALC, TRIG, CHOLHDL, LDLDIRECT in the last 72 hours. Thyroid Function Tests: No results for input(s): TSH, T4TOTAL, FREET4, T3FREE, THYROIDAB in the last 72 hours. Anemia Panel: No results for input(s): VITAMINB12, FOLATE, FERRITIN, TIBC, IRON, RETICCTPCT in the last 72 hours. Sepsis Labs: No results for input(s): PROCALCITON, LATICACIDVEN in the last 168 hours.  Recent Results (from the past 240 hour(s))  Urine Culture     Status: Abnormal   Collection Time: 02/16/21  4:11 PM   Specimen: Urine, Clean Catch  Result Value Ref Range Status   Specimen Description   Final    URINE, CLEAN CATCH Performed at [facility], [address], [place], [place] [address]    Special Requests   Final    NONE Performed at [facility], [place], [place] [address]    Culture >=100,000 COLONIES/mL ESCHERICHIA COLI (A)  Final   Report Status 02/19/2021 FINAL  Final   Organism ID, Bacteria ESCHERICHIA COLI (A)  Final      Susceptibility   Escherichia coli - MIC*    AMPICILLIN >=32 RESISTANT Resistant     CEFAZOLIN <=4 SENSITIVE Sensitive     CEFEPIME <=0.12 SENSITIVE Sensitive     CEFTRIAXONE <=0.25 SENSITIVE Sensitive     CIPROFLOXACIN >=4 RESISTANT Resistant     GENTAMICIN <=1 SENSITIVE Sensitive     IMIPENEM <=0.25 SENSITIVE Sensitive     NITROFURANTOIN <=16 SENSITIVE Sensitive     TRIMETH/SULFA <=20 SENSITIVE Sensitive     AMPICILLIN/SULBACTAM 8 SENSITIVE Sensitive     PIP/TAZO <=4 SENSITIVE Sensitive     * >=100,000 COLONIES/mL ESCHERICHIA COLI  Resp Panel by RT-PCR (Flu A&B, Covid) Urine, In & Out Cath     Status: None   Collection Time: 02/16/21  4:46 PM   Specimen: Urine, In & Out Cath; Nasopharyngeal(NP) swabs in vial transport medium  Result Value Ref Range Status   SARS Coronavirus 2 by RT PCR NEGATIVE NEGATIVE Final    Comment: (NOTE) SARS-CoV-2 target nucleic acids are NOT DETECTED.  The SARS-CoV-2 RNA is  generally detectable in upper respiratory specimens during the acute phase of infection. The lowest concentration of SARS-CoV-2 viral copies this assay can detect is 138 copies/mL. A negative result does not preclude SARS-Cov-2 infection and should not be used as the sole basis for treatment or other patient management decisions. A negative result may occur with  improper specimen collection/handling, submission of specimen other than nasopharyngeal swab, presence of viral mutation(s) within the areas targeted by  this assay, and inadequate number of viral copies(<138 copies/mL). A negative result must be combined with clinical observations, patient history, and epidemiological information. The expected result is Negative.  Fact Sheet for Patients:  EntrepreneurPulse.com.au  Fact Sheet for Healthcare Providers:  IncredibleEmployment.be  This test is no t yet approved or cleared by the [place] FDA and  has been authorized for detection and/or diagnosis of SARS-CoV-2 by FDA under an Emergency Use Authorization (EUA). This EUA will remain  in effect (meaning this test can be used) for the duration of the COVID-19 declaration under Section 564(b)(1) of the Act, 21 U.S.C.section 360bbb-3(b)(1), unless the authorization is terminated  or revoked sooner.       Influenza A by PCR NEGATIVE NEGATIVE Final   Influenza B by PCR NEGATIVE NEGATIVE Final    Comment: (NOTE) The Xpert Xpress SARS-CoV-2/FLU/RSV plus assay is intended as an aid in the diagnosis of influenza from Nasopharyngeal swab specimens and should not be used as a sole basis for treatment. Nasal washings and aspirates are unacceptable for Xpert Xpress SARS-CoV-2/FLU/RSV testing.  Fact Sheet for Patients: EntrepreneurPulse.com.au  Fact Sheet for Healthcare Providers: IncredibleEmployment.be  This test is not yet approved or cleared by the [place] FDA and has been authorized for detection and/or diagnosis of SARS-CoV-2 by FDA under an Emergency Use Authorization (EUA). This EUA will remain in effect (meaning this test can be used) for the duration of the COVID-19 declaration under Section 564(b)(1) of the Act, 21 U.S.C. section 360bbb-3(b)(1), unless the authorization is terminated or revoked.  Performed at [facility], [address], [place], [place] [address]          Radiology Studies: No results found.      Scheduled Meds:  Chlorhexidine Gluconate Cloth  6 each Topical Daily   feeding supplement  1 Container Oral TID BM   levothyroxine  150 mcg Oral Daily   mirtazapine  7.5 mg Oral QHS   sodium chloride flush  10-40 mL Intracatheter Q12H   Continuous Infusions:  dextrose 20 mL/hr at 02/20/21 0538     LOS: 4 days    Time spent: 10 mins    Wyvonnia Dusky, MD [facility] Pager [phone]  If 7PM-7AM, please contact night-coverage 02/20/2021, 7:26 AM

## 2021-02-20 NOTE — TOC Progression Note (Addendum)
Transition of Care [facility]) - Progression Note    Patient Details  Name: Judith Ross MRN: [id number] Date of Birth: 1956/03/02  Transition of Care [facility]) CM/SW Contact  Zigmund Daniel Dorian Pod, RN Phone [phone] 02/20/2021, 3:28 PM  Clinical Narrative:    Received a call from hospice indicating the son [name] refused admission for today and requested an admission for tomorrow. Agency can not accept pt unless son completes the consent. Hospice indicated their office will pursue once again on tomorrow with the son and follow up with TOC for transport at that time.   TOC team will continue to follow up with discharge plans.   Expected Discharge Plan: [place] Barriers to Discharge: Barriers Resolved  Expected Discharge Plan and Services Expected Discharge Plan: [place]   Discharge Planning Services: CM Consult Post Acute Care Choice: Residential Hospice Bed Living arrangements for the past 2 months: Single Family Home Expected Discharge Date: 02/20/21               DME Arranged: N/A DME Agency: NA       HH Arranged: NA HH Agency: NA         Social Determinants of Health (SDOH) Interventions    Readmission Risk Interventions No flowsheet data found.

## 2021-02-20 NOTE — Progress Notes (Signed)
Attempted to call report to [facility]; phone number [phone]. Transferred to nurses station three times without response on phone.

## 2021-02-20 NOTE — TOC Transition Note (Signed)
Transition of Care [facility]) - CM/SW Discharge Note   Patient Details  Name: Judith Ross MRN: [id number] Date of Birth: [birth year]  Transition of Care [facility]) CM/SW Contact:  Harriet Masson, RN Phone Number:[phone] 02/20/2021, 2:17 PM   Clinical Narrative:    Hospice [facility] facility) call with bed availability. Unsuccessful attempt to reach son [name]) however attending permits  ongoing transfer as she spoke with the son earlier thin morning prior to the bed availability. Facility to requesting transport after 8 pm tonight to allow evening shift availability.  [facility] arranged for an evening transport and bedside nurse provided report contact number for hospice [phone] room #4 at the facility.   TOC will continue to follow.   Final next level of care: [place] Barriers to Discharge: Barriers Resolved   Patient Goals and CMS Choice Patient states their goals for this hospitalization and ongoing recovery are:: Family is deciding on residential hospice CMS Medicare.gov Compare Post Acute Care list provided to:: Patient Represenative (must comment) Choice offered to / list presented to : Adult Children, Sibling  Discharge Placement                  Name of family member notified: Fonda Kinder (unsuccessful attempt lvm however attending has spoken with the pt earlier this morning concerning pt's prognosis) Patient and family notified of of transfer: 02/20/21 (unsuccessful attempt but family aware [facility] was awaiting a bed availablity.)  Discharge Plan and Services   Discharge Planning Services: CM Consult Post Acute Care Choice: Residential Hospice Bed          DME Arranged: N/A DME Agency: NA       HH Arranged: NA HH Agency: NA        Social Determinants of Health (SDOH) Interventions     Readmission Risk Interventions No flowsheet data found.

## 2021-02-20 NOTE — Discharge Summary (Addendum)
Physician Discharge Summary  Judith Ross TGY:[id number] DOB: Dec 14, 1955 DOA: 02/16/2021  PCP: Venia Carbon, MD  Admit date: 02/16/2021 Discharge date: 02/21/21  Admitted From: home  Disposition:  hospice facility   Recommendations for Outpatient Follow-up:  Follow up with hospice provider ASAP  Home Health: no  Equipment/Devices:  Discharge Condition: stable  CODE STATUS: full Diet recommendation: as tolerated   Brief/Interim Summary: HPI was taken from Dr. Shawna Clamp:  Judith Ross is a 65 y.o. female with PMH significant for metastatic colon cancer, with peritoneal carcinomatosis,  she has completed multiple cycles of chemotherapy and currently is off chemotherapy.  A. fib, history of PE on Xarelto, obesity, hyperlipidemia, hypertension, sleep apnea presenting to the ED with generalized weakness, fatigue and inability to ambulate.  Patient reports she has been feeling very weak for last 2 weeks, She is unable to ambulate, get up and walk. She reports having decreased appetite, she has not eaten anything in the last 2 weeks.  She denies any shortness of breath, chest pain, denies any abdominal pain,  diarrhea.  Patient was supposed to follow-up with oncologist today but she was unable to get up so she was advised to come back to the ED.   ED Course: She is hemodynamically stable. HR 85, RR 14, BP 126/76, SPO2 94%, temp 98.6 . Labs include sodium 143, potassium 2.0, chloride 99, bicarb 30, glucose 93, BUN 13, creatinine 0.74, calcium 7.6, magnesium 1.8, alkaline phosphatase 57, albumin 2.8, AST 19, ALT 11, total protein 5.9, direct indirect bilirubin 1.8, troponin 10, influenza negative, COVID-negative, UA large leukocytes, positive nitrites many bacteria, WBC 7.8, hemoglobin 10.9, hematocrit 33.6, platelet 258 X-ray abdomen: Nonobstructive bowel gas pattern. Layering left pleural effusion. X-ray chest; Atelectasis with probable layering bilateral pleural effusions.   As  per Dr. Carlynn Spry: 65 year old female with a known history of metastatic colon cancer, peritoneal carcinomatosis is admitted for generalized weakness and adult failure to thrive   12/1: Patient leaning towards comfort care/hospice 12/2: Waiting for hospice home bed    As per Dr. Jimmye Norman 12/3-12/4/22: Pt was already on comfort care only when I started seeing the pt. A hospice bed became available for the night of 02/20/21 and pt was d/c to the hospice facility. For more information, please see previous progress/consult notes.   Discharge Diagnoses:  Principal Problem:   Hypokalemia Active Problems:   Essential hypertension   HLD (hyperlipidemia)   Hypothyroidism   Obstructive sleep apnea   Prediabetes   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HCC)   Acute pulmonary embolism (HCC)   Chemotherapy induced neutropenia (HCC)   Atrial fibrillation with rapid ventricular response (HCC)   Meningioma (HCC)   Atrial fibrillation with RVR (HCC)   Generalized weakness   Palliative care encounter  Failure to thrive: likely secondary to metastatic colon cancer. Continue w/ comfort care only    Metastatic colon cancer: w/ peritoneal carcinomatosis. Continue w/ comfort care only    Generalized weakness: continue w/ comfort care    Hypothyroidism: continue comfort care    HLD: continue comfort care    HTN: continue comfort care  Discharge Instructions  Discharge Instructions     Diet clear liquid   Complete by: As directed    As tolerated   Discharge instructions   Complete by: As directed    F/u w/ hospice provider ASAP   Increase activity slowly   Complete by: As directed       Allergies as of 02/20/2021  Reactions   Erythromycin Itching        Medication List     STOP taking these medications    atorvastatin 10 MG tablet Commonly known as: LIPITOR   dexamethasone 2 MG tablet Commonly known as: DECADRON   metoprolol succinate 25 MG 24 hr tablet Commonly known  as: TOPROL-XL   nystatin 100000 UNIT/ML suspension Commonly known as: MYCOSTATIN   potassium chloride 20 MEQ/15ML (10%) Soln   Xarelto 20 MG Tabs tablet Generic drug: rivaroxaban       TAKE these medications    feeding supplement Liqd Take 237 mLs by mouth 3 (three) times daily between meals.   levothyroxine 150 MCG tablet Commonly known as: SYNTHROID Take 1 tablet (150 mcg total) by mouth daily.   LORazepam 2 MG/ML injection Commonly known as: ATIVAN Inject 1-2 mLs (2-4 mg total) into the vein every 4 (four) hours as needed for anxiety.   mirtazapine 7.5 MG tablet Commonly known as: REMERON Take 1 tablet (7.5 mg total) by mouth at bedtime.   morphine 2 MG/ML injection Inject 1-2 mLs (2-4 mg total) into the vein every 30 (thirty) minutes as needed (severe pain, dyspnea; if not achieving comfort, call MD for infusion orders).   ondansetron 8 MG tablet Commonly known as: ZOFRAN Take 8 mg by mouth 2 (two) times daily as needed for nausea or vomiting.   oxyCODONE-acetaminophen 5-325 MG tablet Commonly known as: PERCOCET/ROXICET Take 1 tablet by mouth every 4 (four) hours as needed for severe pain.   pantoprazole 40 MG tablet Commonly known as: PROTONIX Take 1 tablet (40 mg total) by mouth 2 (two) times daily.        Allergies  Allergen Reactions   Erythromycin Itching    Consultations: Hospice Oncology    Procedures/Studies: CT CHEST ABDOMEN PELVIS W CONTRAST  Result Date: 02/08/2021 CLINICAL DATA:  Stage IV colon adenocarcinoma with peritoneal carcinomatosis diagnosed in June 2022. Pulmonary embolism on Xarelto. Abdominal pain and clinical decline. EXAM: CT CHEST, ABDOMEN, AND PELVIS WITH CONTRAST TECHNIQUE: Multidetector CT imaging of the chest, abdomen and pelvis was performed following the standard protocol during bolus administration of intravenous contrast. CONTRAST:  128mL OMNIPAQUE IOHEXOL 300 MG/ML  SOLN COMPARISON:  Prior CTs 11/23/2020 FINDINGS:  CT CHEST FINDINGS Cardiovascular: Study was not performed as a dedicated CTA, although there is adequate opacification of the pulmonary arteries, and no recurrent pulmonary emboli are demonstrated. Mild atherosclerosis of the aorta and coronary arteries. A right IJ Port-A-Cath extends to the superior cavoatrial junction. The heart size is normal. There is no pericardial effusion. Mediastinum/Nodes: There are no enlarged mediastinal, hilar or axillary lymph nodes. Suspected previous thyroidectomy. The trachea and esophagus appear unremarkable. Lungs/Pleura: Enlarging bilateral pleural effusions, now moderate. No definite pleural base masses or abnormal enhancement. There is minimal nodularity along the right major fissure which is similar to previous study. There is compressive atelectasis dependently in both lungs. No suspicious pulmonary nodules. Musculoskeletal/Chest wall: Grossly stable bilateral breast masses, without significant hypermetabolic activity on PET-CT 08/25/2020. No other chest wall lesion or suspicious osseous findings. CT ABDOMEN AND PELVIS FINDINGS Hepatobiliary: The liver is normal in density without suspicious focal abnormality. No evidence of gallstones, gallbladder wall thickening or biliary dilatation. Pancreas: Unremarkable. No pancreatic ductal dilatation or surrounding inflammatory changes. Spleen: Normal in size without focal abnormality. Adrenals/Urinary Tract: Both adrenal glands appear normal. Stable small cyst in the interpolar region of the right kidney. No evidence of enhancing renal mass, urinary tract calculus or hydronephrosis. The bladder  appears unremarkable for its degree of distention. Stomach/Bowel: Enteric contrast was administered and has passed into the distal colon. The stomach appears unremarkable for its degree of distention. Mild wall thickening of the ascending and transverse colon appears improved. No small bowel wall thickening or distention. Vascular/Lymphatic:  There are no enlarged abdominal or pelvic lymph nodes. Aortic and branch vessel atherosclerosis without acute vascular findings. The portal, superior mesenteric and splenic veins are patent. Reproductive: The uterus and adnexal regions appear unchanged. Probable pelvic floor laxity. Other: Overall volume of ascites is stable to minimally increased from the previous study. Nodularity previously seen within the omentum has improved, with a 9 mm nodule in the gastrocolic ligament on image 73/2 (previously 13 mm). No new or enlarging nodules are identified. Musculoskeletal: No acute or significant osseous findings. Lumbar spine facet arthropathy. IMPRESSION: 1. Further improvement in previously demonstrated omental nodularity consistent with improving carcinomatosis. Overall volume of abdominal ascites is stable to slightly increased. 2. Enlarging moderate-sized bilateral pleural effusions without definite pleural base nodularity. 3. No evidence of solid visceral organ, pulmonary or osseous metastatic disease. 4. Stable bilateral breast nodules. Electronically Signed   By: Richardean Sale M.D.   On: 02/08/2021 14:42   DG Chest Portable 1 View  Result Date: 02/16/2021 CLINICAL DATA:  Shortness of breath. EXAM: PORTABLE CHEST 1 VIEW COMPARISON:  Chest x-ray dated September 22, 2020 FINDINGS: Right chest wall port is unchanged in position. Cardiac and mediastinal contours are unchanged and within normal limits for AP technique. Hazy opacities of the bilateral lower lungs, likely due to layering pleural effusions. Additional bilateral linear opacities are seen which are likely due to atelectasis. No evidence of pneumothorax. IMPRESSION: Atelectasis with probable layering bilateral pleural effusions. Electronically Signed   By: Yetta Glassman M.D.   On: 02/16/2021 16:24   DG Abd 2 Views  Result Date: 02/16/2021 CLINICAL DATA:  Constipation, decreased ambulation EXAM: ABDOMEN - 2 VIEW COMPARISON:  CT 02/08/2021  FINDINGS: Right lung base clear.  Layering left pleural effusion. No free air. Normal bowel gas pattern. No abnormal abdominal calcifications. Regional bones unremarkable. IMPRESSION: 1. Nonobstructive bowel gas pattern. 2. Layering left pleural effusion. Electronically Signed   By: Lucrezia Europe M.D.   On: 02/16/2021 12:35   (Echo, Carotid, EGD, Colonoscopy, ERCP)    Subjective: pt c/o malaise    Discharge Exam: Vitals:   02/19/21 2041 02/20/21 0812  BP: (!) 153/108 (!) 138/93  Pulse: 86 84  Resp:  16  Temp: 98.3 F (36.8 C) 97.8 F (36.6 C)  SpO2: 98% 100%   Vitals:   02/19/21 0446 02/19/21 0747 02/19/21 2041 02/20/21 0812  BP: (!) 153/101 (!) 149/88 (!) 153/108 (!) 138/93  Pulse: 79 79 86 84  Resp: 14 14  16   Temp: (!) 97.4 F (36.3 C) 97.9 F (36.6 C) 98.3 F (36.8 C) 97.8 F (36.6 C)  TempSrc: Oral Oral Oral Oral  SpO2: 100% 100% 98% 100%  Weight:      Height:        General: Pt is alert, awake, not in acute distress Cardiovascular: S1/S2 +, no rubs, no gallops Respiratory: diminished breath sounds b/l  Abdominal: Soft, NT, ND, bowel sounds + Extremities: no cyanosis    The results of significant diagnostics from this hospitalization (including imaging, microbiology, ancillary and laboratory) are listed below for reference.     Microbiology: Recent Results (from the past 240 hour(s))  Urine Culture     Status: Abnormal   Collection Time: 02/16/21  4:11 PM   Specimen: Urine, Clean Catch  Result Value Ref Range Status   Specimen Description   Final    URINE, CLEAN CATCH Performed at [facility], [address] [place], [place] [address]    Special Requests   Final    NONE Performed at [facility], [place], [place] [address]    Culture >=100,000 COLONIES/mL ESCHERICHIA COLI (A)  Final   Report Status 02/19/2021 FINAL  Final   Organism ID, Bacteria ESCHERICHIA COLI (A)  Final      Susceptibility   Escherichia coli -  MIC*    AMPICILLIN >=32 RESISTANT Resistant     CEFAZOLIN <=4 SENSITIVE Sensitive     CEFEPIME <=0.12 SENSITIVE Sensitive     CEFTRIAXONE <=0.25 SENSITIVE Sensitive     CIPROFLOXACIN >=4 RESISTANT Resistant     GENTAMICIN <=1 SENSITIVE Sensitive     IMIPENEM <=0.25 SENSITIVE Sensitive     NITROFURANTOIN <=16 SENSITIVE Sensitive     TRIMETH/SULFA <=20 SENSITIVE Sensitive     AMPICILLIN/SULBACTAM 8 SENSITIVE Sensitive     PIP/TAZO <=4 SENSITIVE Sensitive     * >=100,000 COLONIES/mL ESCHERICHIA COLI  Resp Panel by RT-PCR (Flu A&B, Covid) Urine, In & Out Cath     Status: None   Collection Time: 02/16/21  4:46 PM   Specimen: Urine, In & Out Cath; Nasopharyngeal(NP) swabs in vial transport medium  Result Value Ref Range Status   SARS Coronavirus 2 by RT PCR NEGATIVE NEGATIVE Final    Comment: (NOTE) SARS-CoV-2 target nucleic acids are NOT DETECTED.  The SARS-CoV-2 RNA is generally detectable in upper respiratory specimens during the acute phase of infection. The lowest concentration of SARS-CoV-2 viral copies this assay can detect is 138 copies/mL. A negative result does not preclude SARS-Cov-2 infection and should not be used as the sole basis for treatment or other patient management decisions. A negative result may occur with  improper specimen collection/handling, submission of specimen other than nasopharyngeal swab, presence of viral mutation(s) within the areas targeted by this assay, and inadequate number of viral copies(<138 copies/mL). A negative result must be combined with clinical observations, patient history, and epidemiological information. The expected result is Negative.  Fact Sheet for Patients:  EntrepreneurPulse.com.au  Fact Sheet for Healthcare Providers:  IncredibleEmployment.be  This test is no t yet approved or cleared by the [place] FDA and  has been authorized for detection and/or diagnosis of SARS-CoV-2 by FDA  under an Emergency Use Authorization (EUA). This EUA will remain  in effect (meaning this test can be used) for the duration of the COVID-19 declaration under Section 564(b)(1) of the Act, 21 U.S.C.section 360bbb-3(b)(1), unless the authorization is terminated  or revoked sooner.       Influenza A by PCR NEGATIVE NEGATIVE Final   Influenza B by PCR NEGATIVE NEGATIVE Final    Comment: (NOTE) The Xpert Xpress SARS-CoV-2/FLU/RSV plus assay is intended as an aid in the diagnosis of influenza from Nasopharyngeal swab specimens and should not be used as a sole basis for treatment. Nasal washings and aspirates are unacceptable for Xpert Xpress SARS-CoV-2/FLU/RSV testing.  Fact Sheet for Patients: EntrepreneurPulse.com.au  Fact Sheet for Healthcare Providers: IncredibleEmployment.be  This test is not yet approved or cleared by the [place] FDA and has been authorized for detection and/or diagnosis of SARS-CoV-2 by FDA under an Emergency Use Authorization (EUA). This EUA will remain in effect (meaning this test can be used) for the duration of the COVID-19  declaration under Section 564(b)(1) of the Act, 21 U.S.C. section 360bbb-3(b)(1), unless the authorization is terminated or revoked.  Performed at [facility], [address] [place], [place] [address]      Labs: BNP (last 3 results) Recent Labs    09/18/20 1819  BNP 502.7*   Basic Metabolic Panel: Recent Labs  Lab 02/16/21 1230 02/16/21 1518 02/16/21 1918 02/17/21 0614 02/18/21 0457 02/18/21 1548  NA 143  --  141 142 144  --   K 2.0*  --  2.5* 2.8* 2.5* 3.2*  CL 99  --  98 102 104  --   CO2 30  --  30 28 29   --   GLUCOSE 93  --  83 70 77  --   BUN 13  --  13 12 13   --   CREATININE 0.74  --  0.75 0.58 0.57  --   CALCIUM 7.6*  --  7.6* 7.2* 7.1*  --   MG  --  2.1  --  2.1  --   --   PHOS  --   --   --  3.1  --   --    Liver Function Tests: Recent Labs  Lab  02/16/21 1600 02/17/21 0614  AST 19 18  ALT 11 9  ALKPHOS 52 57  BILITOT 2.3* 1.8*  PROT 5.9* 5.7*  ALBUMIN 2.8* 2.6*   No results for input(s): LIPASE, AMYLASE in the last 168 hours. No results for input(s): AMMONIA in the last 168 hours. CBC: Recent Labs  Lab 02/16/21 1137 02/16/21 1230 02/17/21 0614 02/18/21 0457  WBC 8.2 7.1 8.9 8.4  HGB 11.1* 10.9* 11.1* 10.0*  HCT 34.1* 33.6* 35.4* 31.2*  MCV 91.7 91.3 91.2 90.7  PLT 269 258 241 248   Cardiac Enzymes: No results for input(s): CKTOTAL, CKMB, CKMBINDEX, TROPONINI in the last 168 hours. BNP: Invalid input(s): POCBNP CBG: Recent Labs  Lab 02/18/21 1044 02/18/21 1414  GLUCAP 72 78   D-Dimer No results for input(s): DDIMER in the last 72 hours. Hgb A1c No results for input(s): HGBA1C in the last 72 hours. Lipid Profile No results for input(s): CHOL, HDL, LDLCALC, TRIG, CHOLHDL, LDLDIRECT in the last 72 hours. Thyroid function studies No results for input(s): TSH, T4TOTAL, T3FREE, THYROIDAB in the last 72 hours.  Invalid input(s): FREET3 Anemia work up No results for input(s): VITAMINB12, FOLATE, FERRITIN, TIBC, IRON, RETICCTPCT in the last 72 hours. Urinalysis    Component Value Date/Time   COLORURINE AMBER (A) 02/16/2021 1611   APPEARANCEUR CLOUDY (A) 02/16/2021 1611   LABSPEC 1.025 02/16/2021 1611   PHURINE 6.0 02/16/2021 1611   GLUCOSEU NEGATIVE 02/16/2021 1611   HGBUR TRACE (A) 02/16/2021 1611   BILIRUBINUR LARGE (A) 02/16/2021 1611   BILIRUBINUR trace 07/14/2020 1141   KETONESUR 40 (A) 02/16/2021 1611   PROTEINUR 100 (A) 02/16/2021 1611   UROBILINOGEN 0.2 07/14/2020 1141   UROBILINOGEN 1.0 08/03/2011 0856   NITRITE POSITIVE (A) 02/16/2021 1611   LEUKOCYTESUR LARGE (A) 02/16/2021 1611   Sepsis Labs Invalid input(s): PROCALCITONIN,  WBC,  LACTICIDVEN Microbiology Recent Results (from the past 240 hour(s))  Urine Culture     Status: Abnormal   Collection Time: 02/16/21  4:11 PM   Specimen:  Urine, Clean Catch  Result Value Ref Range Status   Specimen Description   Final    URINE, CLEAN CATCH Performed at [facility], [address], [place], [place] [address]    Special Requests   Final  NONE Performed at [facility], [address] [place], [place] [address]    Culture >=100,000 COLONIES/mL ESCHERICHIA COLI (A)  Final   Report Status 02/19/2021 FINAL  Final   Organism ID, Bacteria ESCHERICHIA COLI (A)  Final      Susceptibility   Escherichia coli - MIC*    AMPICILLIN >=32 RESISTANT Resistant     CEFAZOLIN <=4 SENSITIVE Sensitive     CEFEPIME <=0.12 SENSITIVE Sensitive     CEFTRIAXONE <=0.25 SENSITIVE Sensitive     CIPROFLOXACIN >=4 RESISTANT Resistant     GENTAMICIN <=1 SENSITIVE Sensitive     IMIPENEM <=0.25 SENSITIVE Sensitive     NITROFURANTOIN <=16 SENSITIVE Sensitive     TRIMETH/SULFA <=20 SENSITIVE Sensitive     AMPICILLIN/SULBACTAM 8 SENSITIVE Sensitive     PIP/TAZO <=4 SENSITIVE Sensitive     * >=100,000 COLONIES/mL ESCHERICHIA COLI  Resp Panel by RT-PCR (Flu A&B, Covid) Urine, In & Out Cath     Status: None   Collection Time: 02/16/21  4:46 PM   Specimen: Urine, In & Out Cath; Nasopharyngeal(NP) swabs in vial transport medium  Result Value Ref Range Status   SARS Coronavirus 2 by RT PCR NEGATIVE NEGATIVE Final    Comment: (NOTE) SARS-CoV-2 target nucleic acids are NOT DETECTED.  The SARS-CoV-2 RNA is generally detectable in upper respiratory specimens during the acute phase of infection. The lowest concentration of SARS-CoV-2 viral copies this assay can detect is 138 copies/mL. A negative result does not preclude SARS-Cov-2 infection and should not be used as the sole basis for treatment or other patient management decisions. A negative result may occur with  improper specimen collection/handling, submission of specimen other than nasopharyngeal swab, presence of viral mutation(s) within the areas targeted by this assay, and  inadequate number of viral copies(<138 copies/mL). A negative result must be combined with clinical observations, patient history, and epidemiological information. The expected result is Negative.  Fact Sheet for Patients:  EntrepreneurPulse.com.au  Fact Sheet for Healthcare Providers:  IncredibleEmployment.be  This test is no t yet approved or cleared by the [place] FDA and  has been authorized for detection and/or diagnosis of SARS-CoV-2 by FDA under an Emergency Use Authorization (EUA). This EUA will remain  in effect (meaning this test can be used) for the duration of the COVID-19 declaration under Section 564(b)(1) of the Act, 21 U.S.C.section 360bbb-3(b)(1), unless the authorization is terminated  or revoked sooner.       Influenza A by PCR NEGATIVE NEGATIVE Final   Influenza B by PCR NEGATIVE NEGATIVE Final    Comment: (NOTE) The Xpert Xpress SARS-CoV-2/FLU/RSV plus assay is intended as an aid in the diagnosis of influenza from Nasopharyngeal swab specimens and should not be used as a sole basis for treatment. Nasal washings and aspirates are unacceptable for Xpert Xpress SARS-CoV-2/FLU/RSV testing.  Fact Sheet for Patients: EntrepreneurPulse.com.au  Fact Sheet for Healthcare Providers: IncredibleEmployment.be  This test is not yet approved or cleared by the [place] FDA and has been authorized for detection and/or diagnosis of SARS-CoV-2 by FDA under an Emergency Use Authorization (EUA). This EUA will remain in effect (meaning this test can be used) for the duration of the COVID-19 declaration under Section 564(b)(1) of the Act, 21 U.S.C. section 360bbb-3(b)(1), unless the authorization is terminated or revoked.  Performed at [facility], [address], [place], [place] [address]      Time coordinating discharge: Over 30 minutes  SIGNED:   Wyvonnia Dusky,  MD  [facility] 02/20/2021, 2:12 PM Pager   If 7PM-7AM, please contact night-coverage

## 2021-02-20 NOTE — Progress Notes (Signed)
[facility] called. Unable to transport. Raina Mina, case management, made aware. Case management resumed call from that point. Case management canceling EMS transport at this time

## 2021-02-21 NOTE — Care Management Important Message (Signed)
Important Message  Patient Details  Name: Judith Ross MRN: [id number] Date of Birth: 1956/01/13   Medicare Important Message Given:  Other (see comment)  Patient is on comfort care with plans to discharge to the Hospice Home. Out of respect for the patient and family no Important Message from [facility] given.  Juliann Pulse A Lelia Jons 02/21/2021, 8:24 AM

## 2021-02-21 NOTE — TOC Transition Note (Signed)
Transition of Care [facility]) - CM/SW Discharge Note   Patient Details  Name: Judith Ross MRN: [id number] Date of Birth: 03-Jan-1956  [facility] CM/SW Contact:  Kerin Salen, RN Phone Number: 02/21/2021, 3:17 PM   Clinical Narrative: Patient to transport to [place] today via [facility] [place] today. TOC barriers resolved.     Final next level of care: [place] Barriers to Discharge: Barriers Resolved   Patient Goals and CMS Choice Patient states their goals for this hospitalization and ongoing recovery are:: Hospice Home CMS Medicare.gov Compare Post Acute Care list provided to:: Patient Represenative (must comment) Choice offered to / list presented to : Adult Children, Sibling  Discharge Placement                Patient to be transferred to facility by: [place] Name of family member notified: Fonda Kinder (unsuccessful attempt lvm however attending has spoken with the pt earlier this morning concerning pt's prognosis) Patient and family notified of of transfer: 02/21/21  Discharge Plan and Services   Discharge Planning Services: CM Consult Post Acute Care Choice: Residential Hospice Bed          DME Arranged: N/A DME Agency: NA       HH Arranged: NA HH Agency: NA        Social Determinants of Health (SDOH) Interventions     Readmission Risk Interventions No flowsheet data found.

## 2021-02-21 NOTE — Progress Notes (Signed)
[facility]) Hospital Liaison Note   Hospice Home is able to offer a bed today with request for transport at 6 pm.   Family agreeable to transfer today. Donnetta Hutching, RN [facility] Manager aware.   RN please call report to [facility] at [phone] prior to patient leaving the unit.  Please send signed and completed DNR with patient at discharge.   Please do not hesitate to call with any hospice related questions.    Thank you for the opportunity to participate in this patient's care.   Bobbie "Loren Racer, RN, BSN [facility] Liaison [phone]

## 2021-02-22 ENCOUNTER — Telehealth: Payer: Self-pay

## 2021-02-22 NOTE — Telephone Encounter (Signed)
Per note from Dr Silvio Pate: See if you can reach her or son to see if she wants me to make a home visit. I tried calling when she was in the hospital and couldn't reach either one    I called and left a message on cell VM per DPR asking if pt would like for Dr Silvio Pate to do a home visit for a hospital follow-up. Advised she can call the office or send a MyChart message.

## 2021-03-07 ENCOUNTER — Other Ambulatory Visit: Payer: Self-pay | Admitting: *Deleted

## 2021-03-07 MED ORDER — MIRTAZAPINE 7.5 MG PO TABS: 7.5000 mg | ORAL_TABLET | Freq: Every day | ORAL | 0 refills | Status: AC

## 2021-03-20 DEATH — deceased

## 2021-09-03 IMAGING — CR DG CHEST 2V
1 series · 1 of 1 positions shown · non-contrast
Comparison: CT AP 08/02/2020

CLINICAL DATA: Shortness of breath.

EXAM:
CHEST - 2 VIEW

[chest lat]
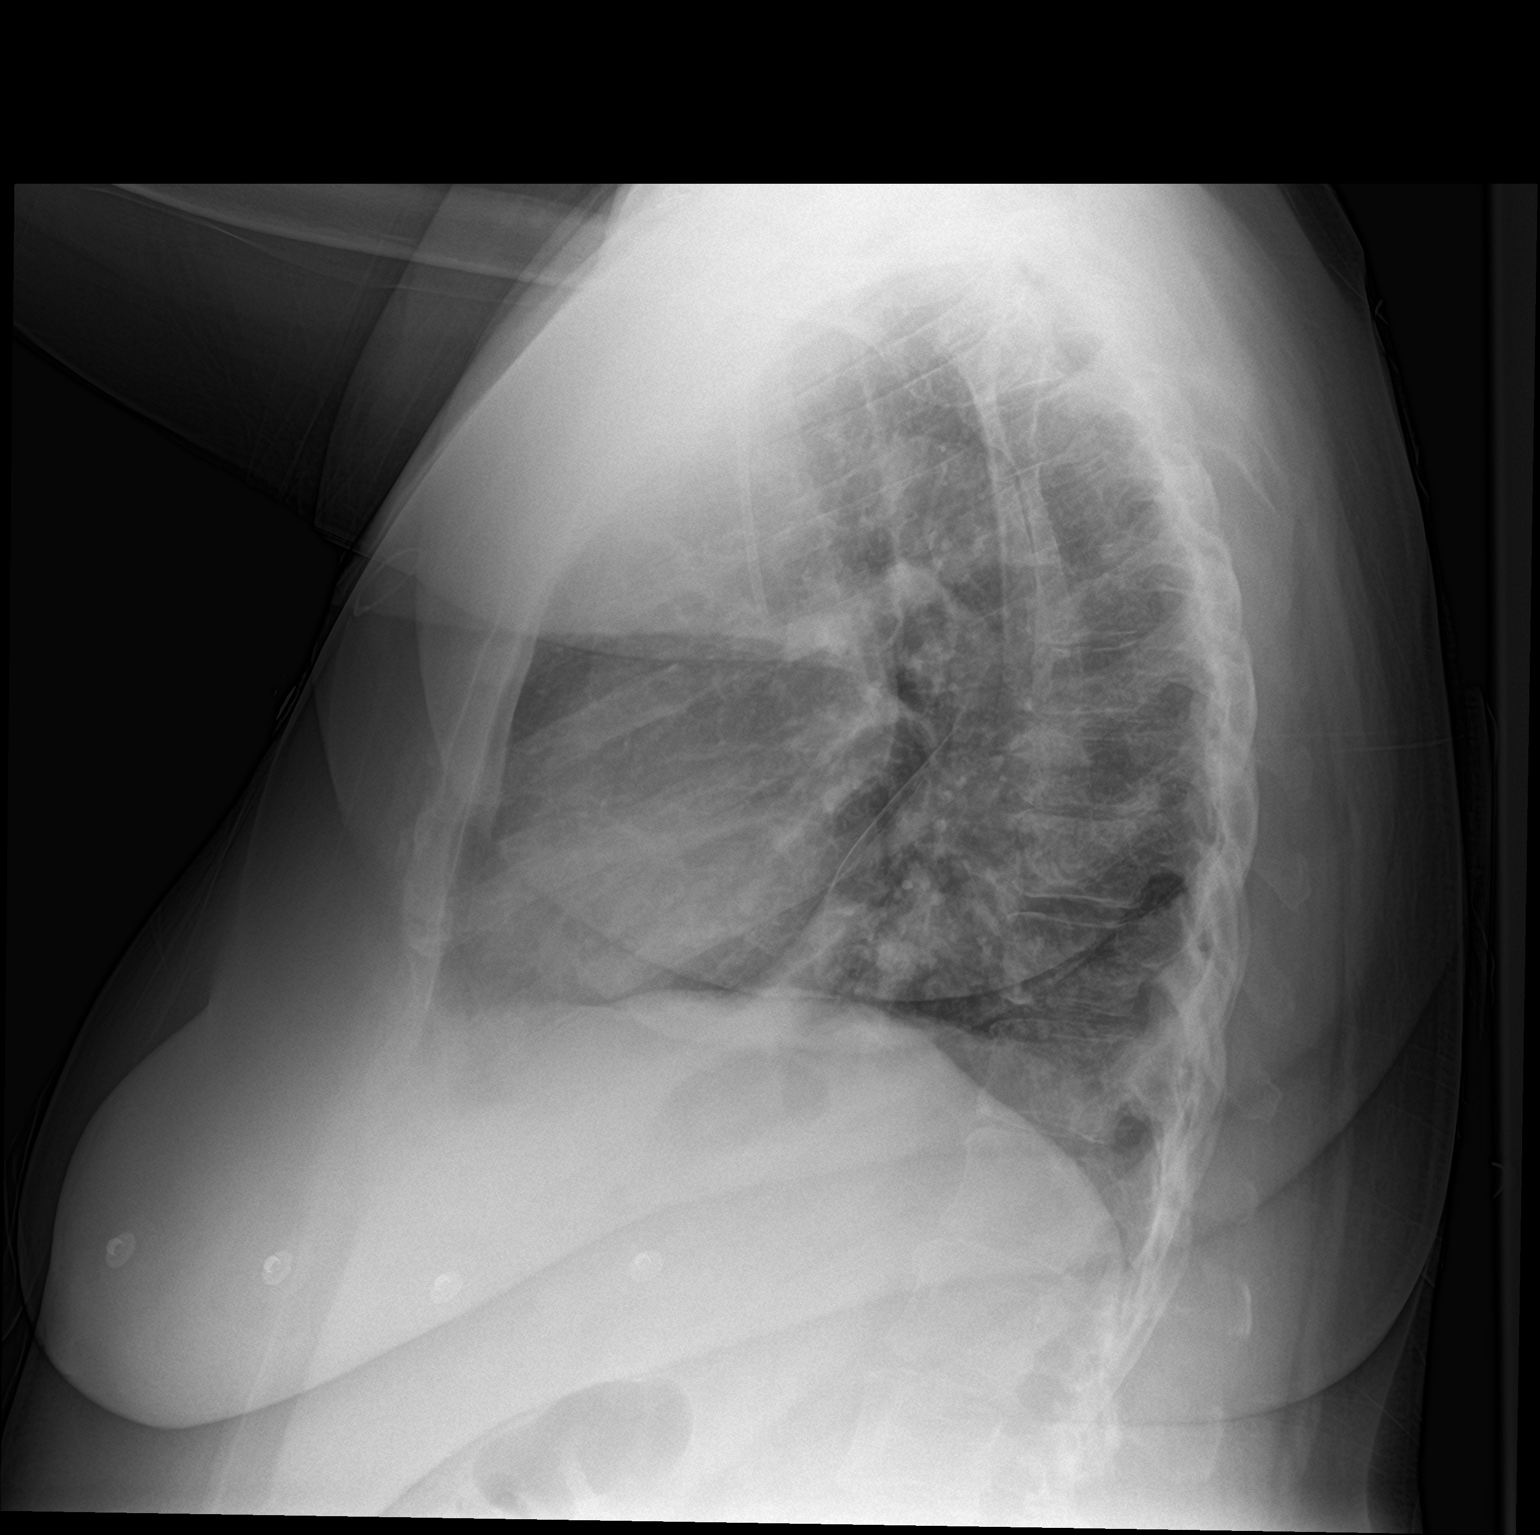

[1 of 1 positions shown; findings below may reference images not displayed]

FINDINGS: There is a right chest wall port catheter with tip in the SVC.
Normal heart size. Streaky opacities within the left base may
represent atelectasis and or airspace disease. Pulmonary vascular
congestion. Right lung appears clear. The visualized osseous
structures are unremarkable.
IMPRESSION: 1. Left base atelectasis and/or airspace disease.
2. Pulmonary vascular congestion.

## 2021-09-04 IMAGING — US US EXTREM LOW VENOUS
1 series · 13 of 24 positions shown · non-contrast
Comparison: December 28, 2008.

CLINICAL DATA: ACUTE PULMONARY EMBOLISM.



[Series 1: us venous img lower bilat (dvt) · portal-venous · 13 of 71 slices shown]
[im 1/71]
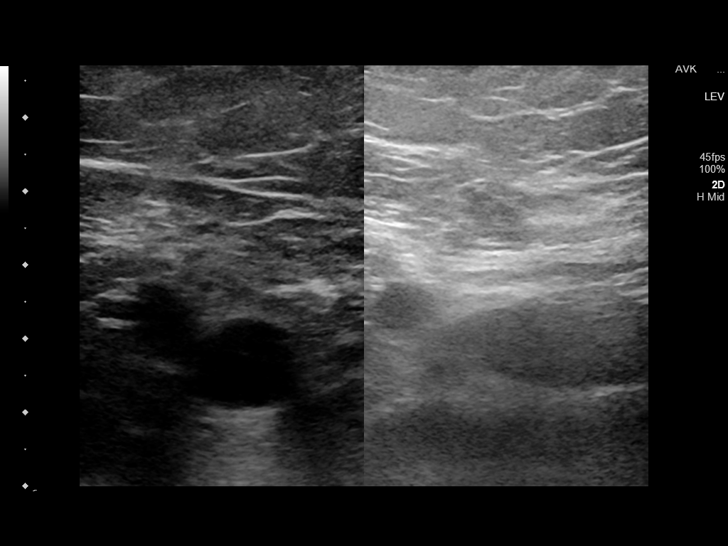
[im 7/71]
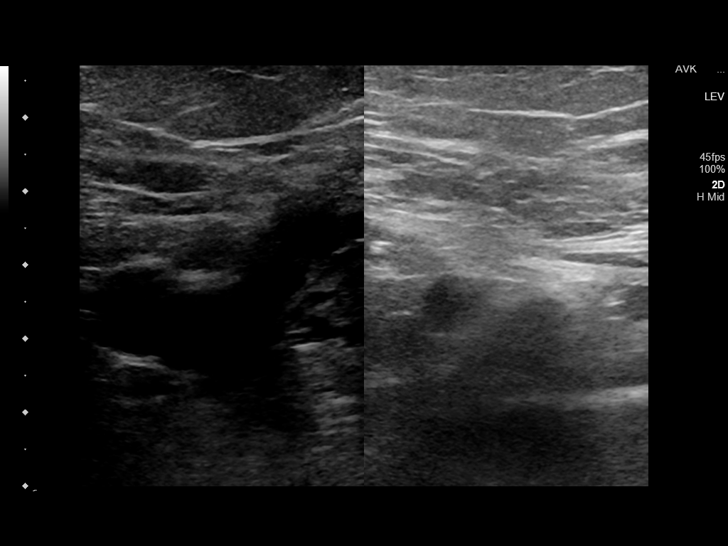
[im 13/71]
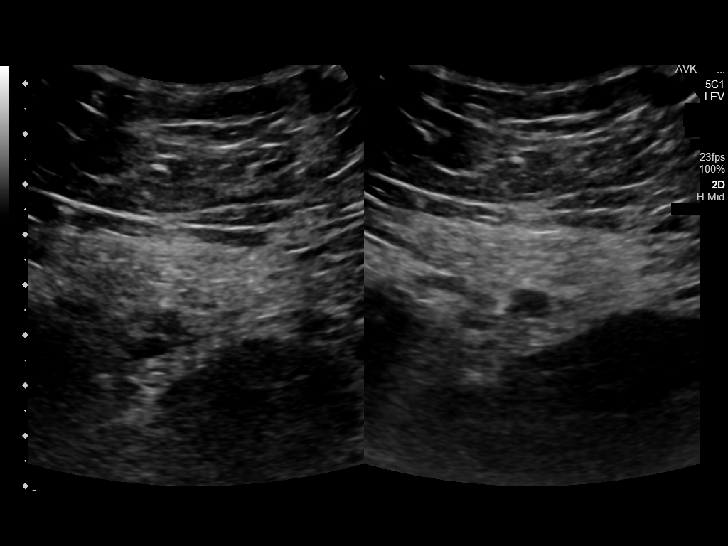
[im 19/71]
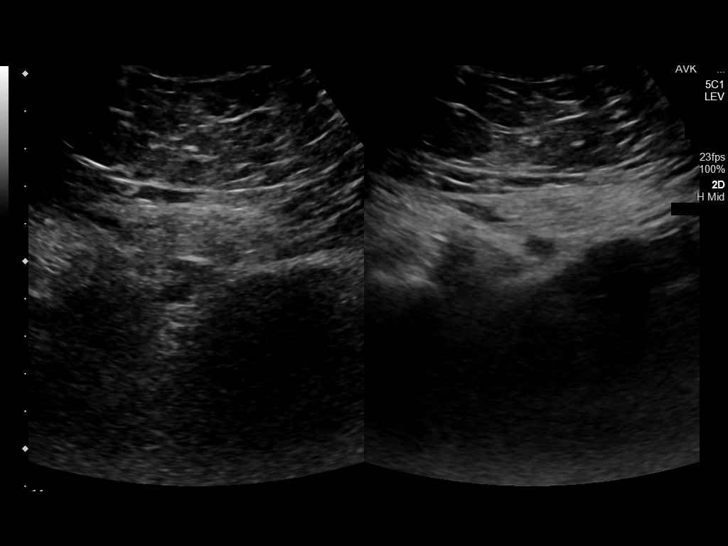
[im 25/71]
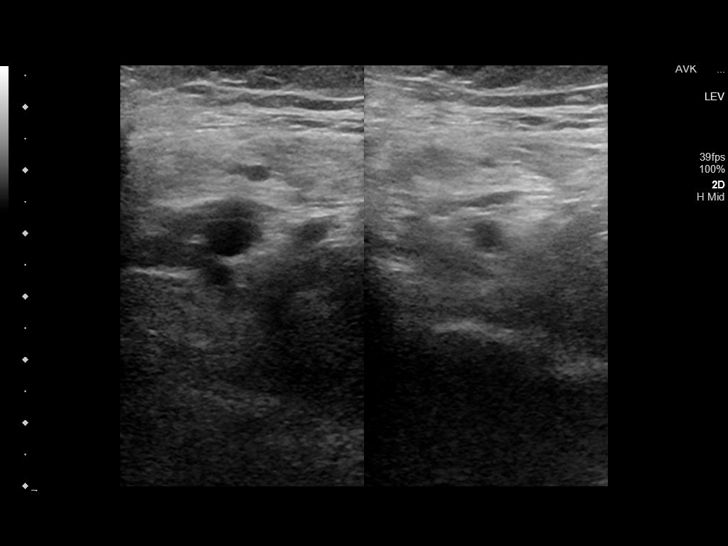
[im 31/71]
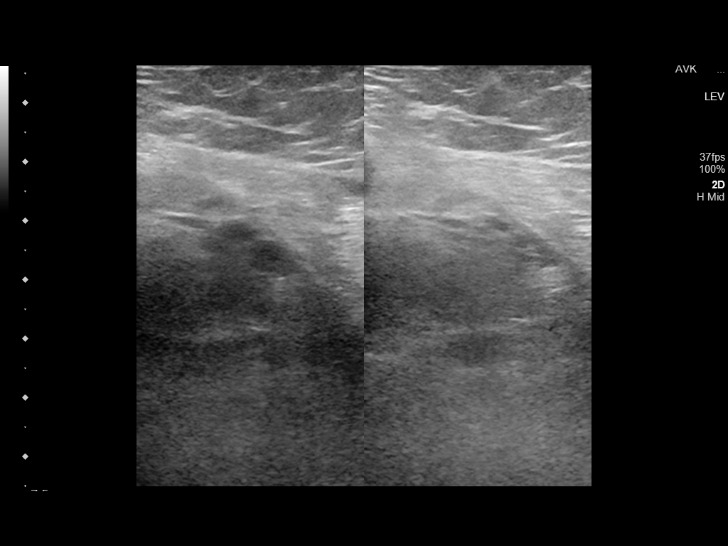
[im 37/71]
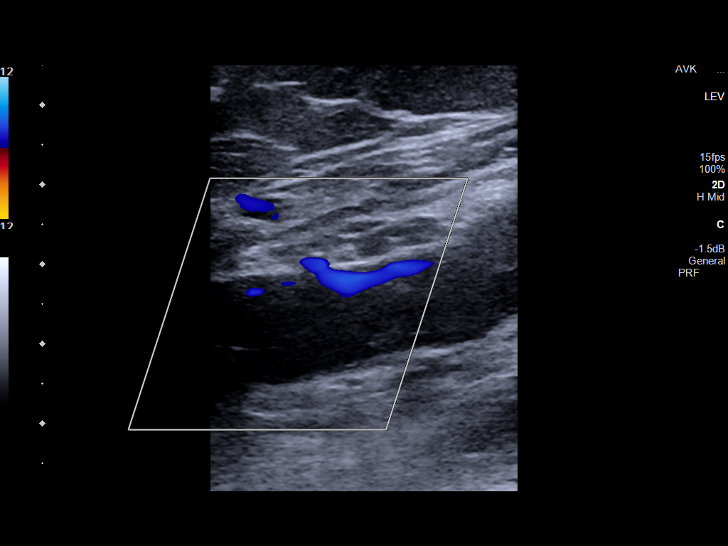
[im 40/71]
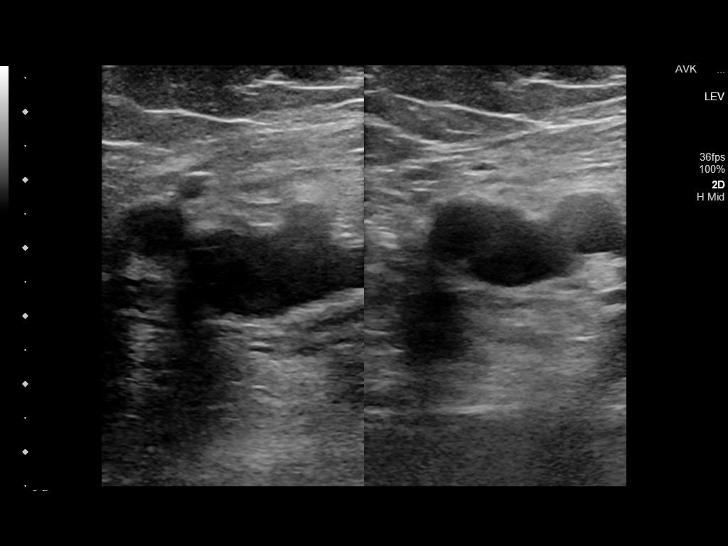
[im 46/71]
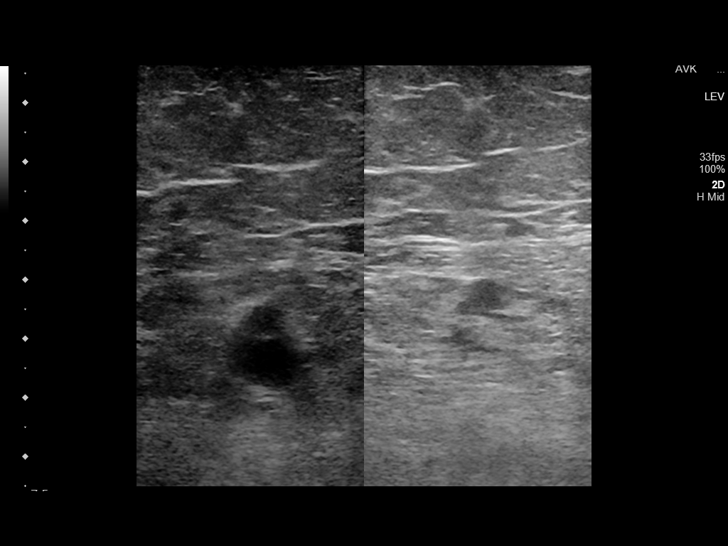
[im 52/71]
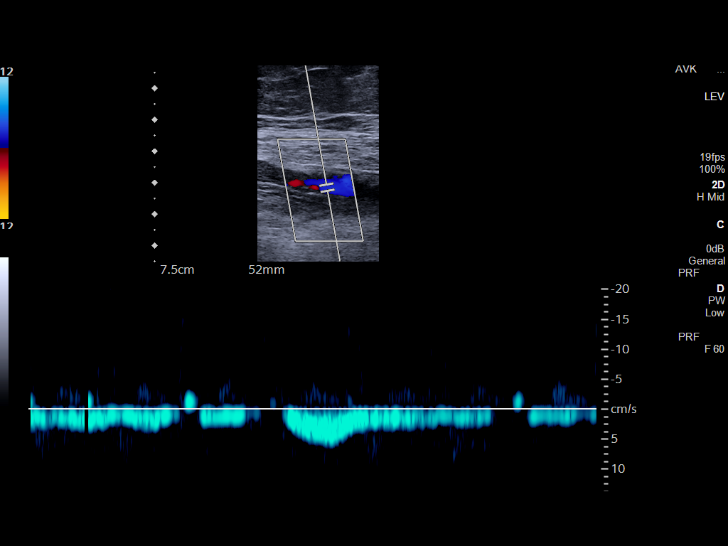
[im 58/71]
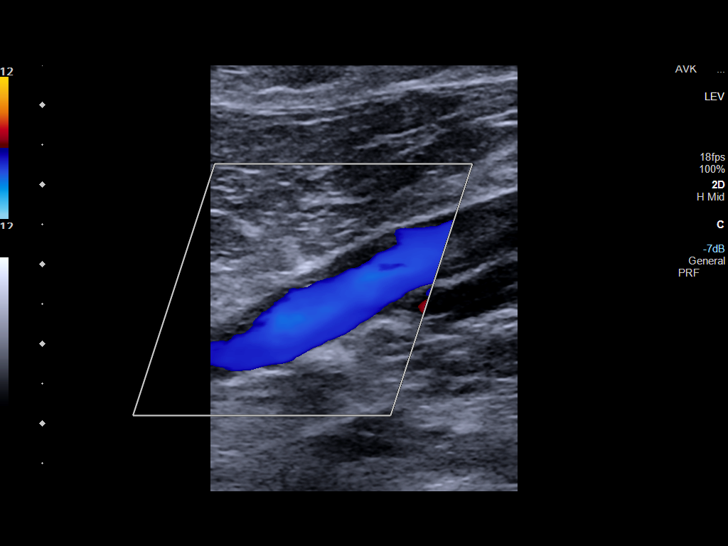
[im 64/71]
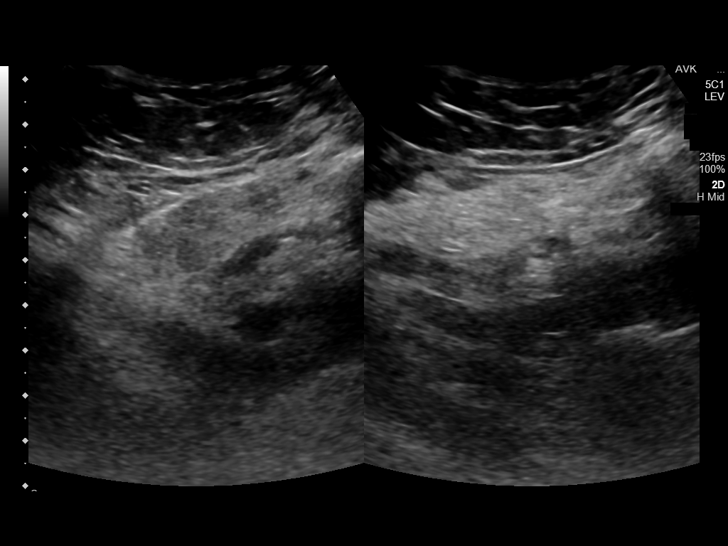
[im 71/71]
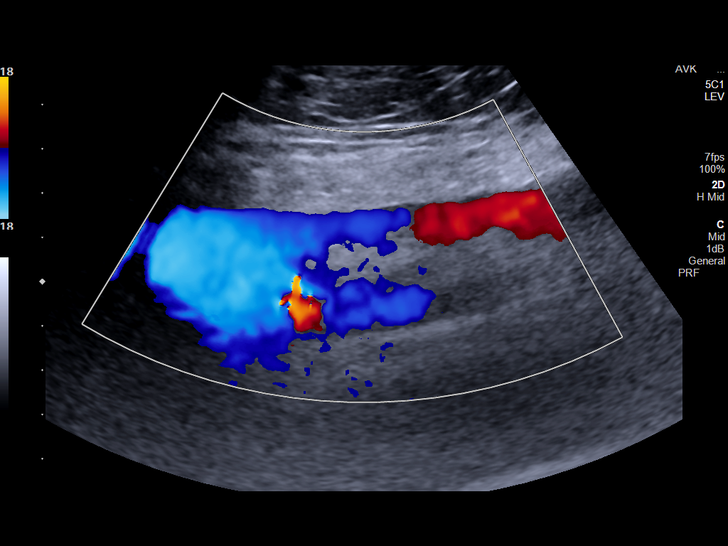

[13 of 24 positions shown; findings below may reference images not displayed]

FINDINGS: RIGHT LOWER EXTREMITY

Common Femoral Vein: No evidence of thrombus. Normal
compressibility, respiratory phasicity and response to augmentation.

Saphenofemoral Junction: No evidence of thrombus. Normal
compressibility and flow on color Doppler imaging.

Profunda Femoral Vein: No evidence of thrombus. Normal
compressibility and flow on color Doppler imaging.

Femoral Vein: No evidence of thrombus. Normal compressibility,
respiratory phasicity and response to augmentation.

Popliteal Vein: No evidence of thrombus. Normal compressibility,
respiratory phasicity and response to augmentation.

Calf Veins: No evidence of thrombus. Normal compressibility and flow
on color Doppler imaging.

Venous Reflux:  None.

Other Findings:  None.

LEFT LOWER EXTREMITY

Common Femoral Vein: Nonocclusive thrombus is noted.

Saphenofemoral Junction: No evidence of thrombus. Normal
compressibility and flow on color Doppler imaging.

Profunda Femoral Vein: Nonocclusive thrombus is noted.

Femoral Vein: Nonocclusive thrombus is noted.

Popliteal Vein: Nonocclusive thrombus is noted.

Calf Veins: No evidence of thrombus. Normal compressibility and flow
on color Doppler imaging.

Superficial Great Saphenous Vein: No evidence of thrombus. Normal
compressibility.

Venous Reflux:  None.

Other Findings:  None.
IMPRESSION: Nonocclusive deep venous thrombosis is noted in the left common
femoral, profunda femoral, superficial femoral and popliteal veins.
These results will be called to the ordering clinician or
representative by the Radiologist Assistant, and communication
documented in the PACS or zVision Dashboard.

## 2021-09-07 IMAGING — DX DG CHEST 1V PORT
1 series · 1 of 1 positions shown · non-contrast
Comparison: Portable exam 6195 hours compared to 09/18/2020

CLINICAL DATA: Shortness of breath, hypertension, metastatic colon
cancer

EXAM:
PORTABLE CHEST 1 VIEW

[chest ap]
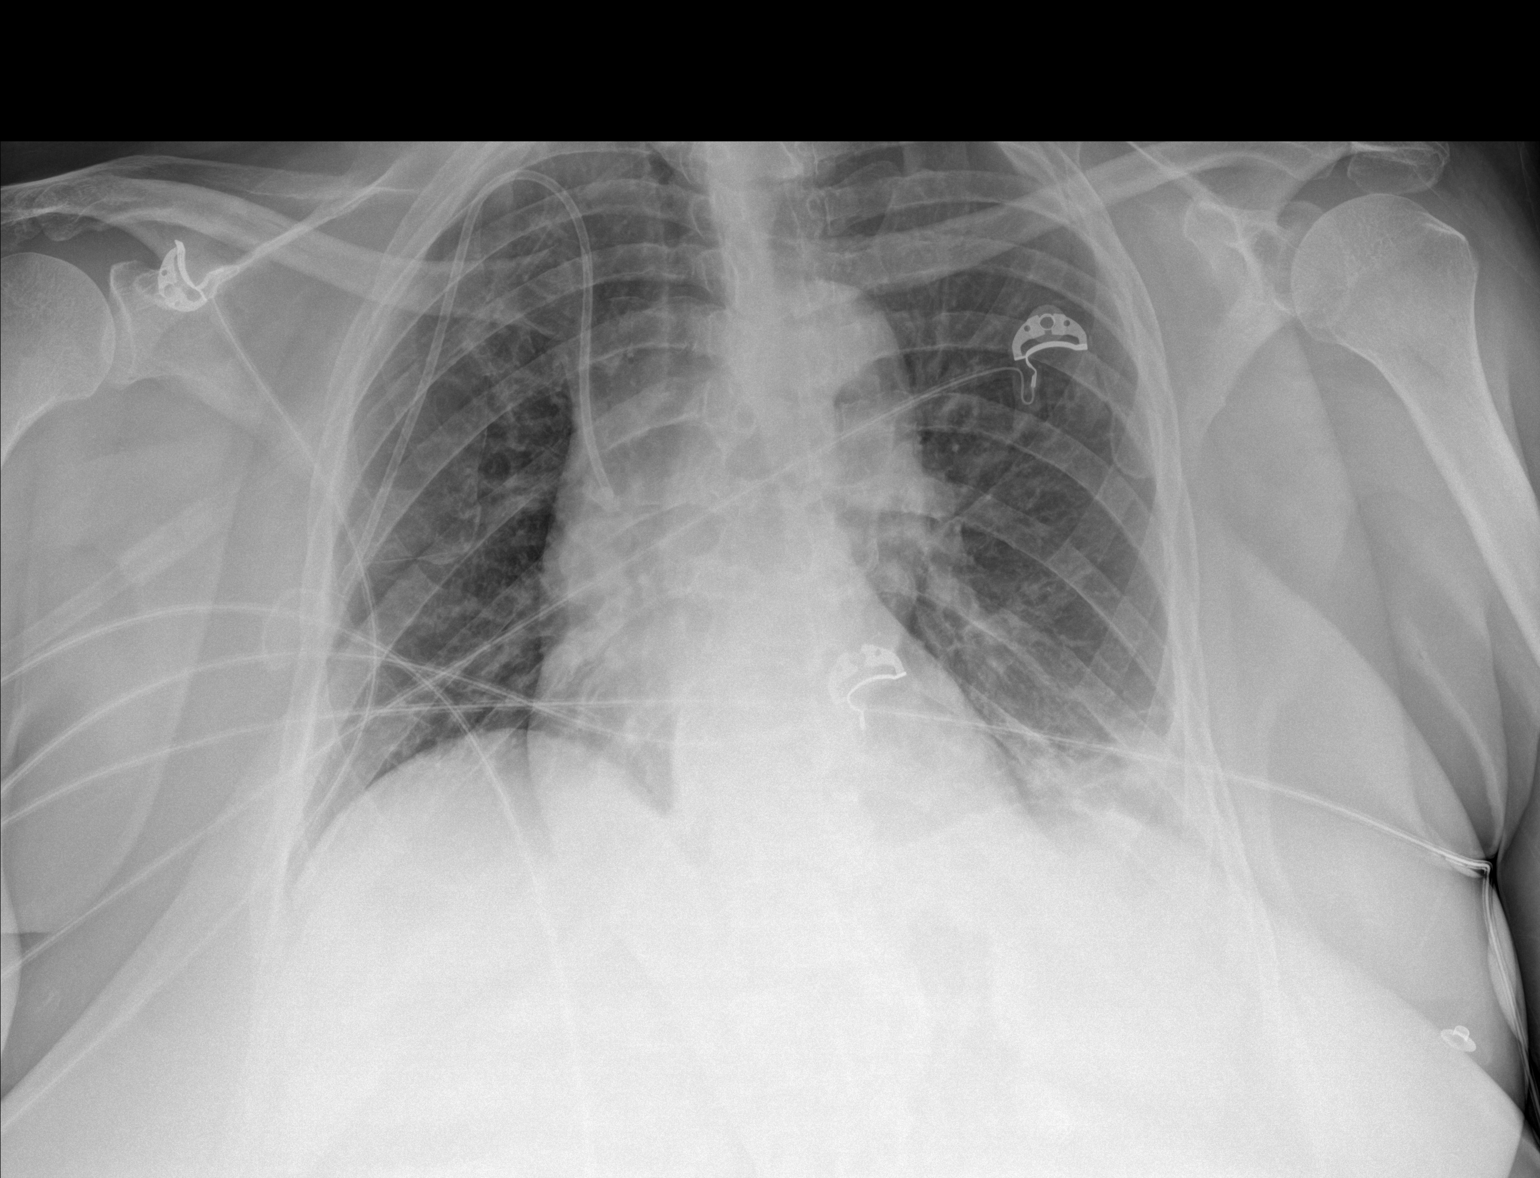

[1 of 1 positions shown; findings below may reference images not displayed]

FINDINGS: RIGHT jugular Port-A-Cath with tip projecting over SVC at the level
of the azygos confluence unchanged.

Slightly rotated to the RIGHT.

Normal heart size, mediastinal contours, and pulmonary vascularity.

Persistent subsegmental atelectasis at LEFT base.

Remaining lungs clear.

No pulmonary infiltrate, pleural effusion, or pneumothorax.

Osseous structures unremarkable.
IMPRESSION: Persistent subsegmental atelectasis LEFT lung base.

## 2022-02-01 IMAGING — CR DG ABDOMEN 2V
3 series · 3 of 3 positions shown · non-contrast
Comparison: CT 02/08/2021

CLINICAL DATA: Constipation, decreased ambulation

EXAM:
ABDOMEN - 2 VIEW

[abdomen erect]
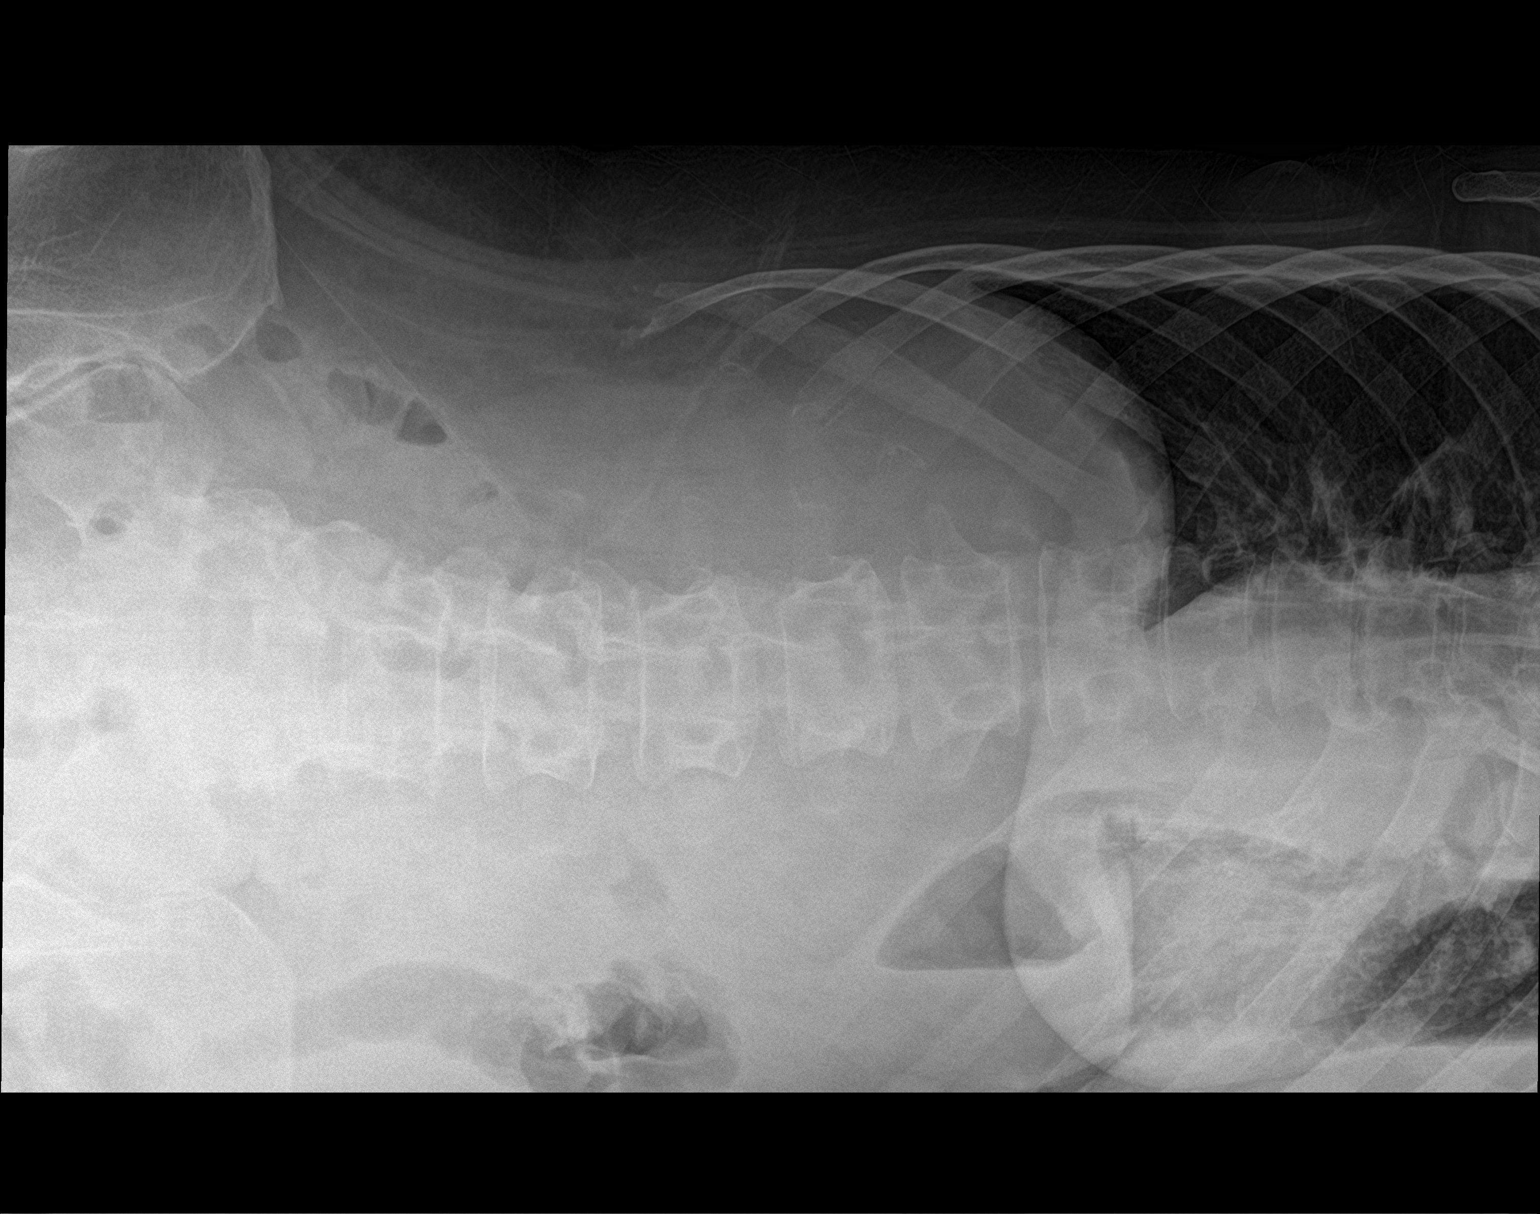

[abdomen supine (1 of 2)]
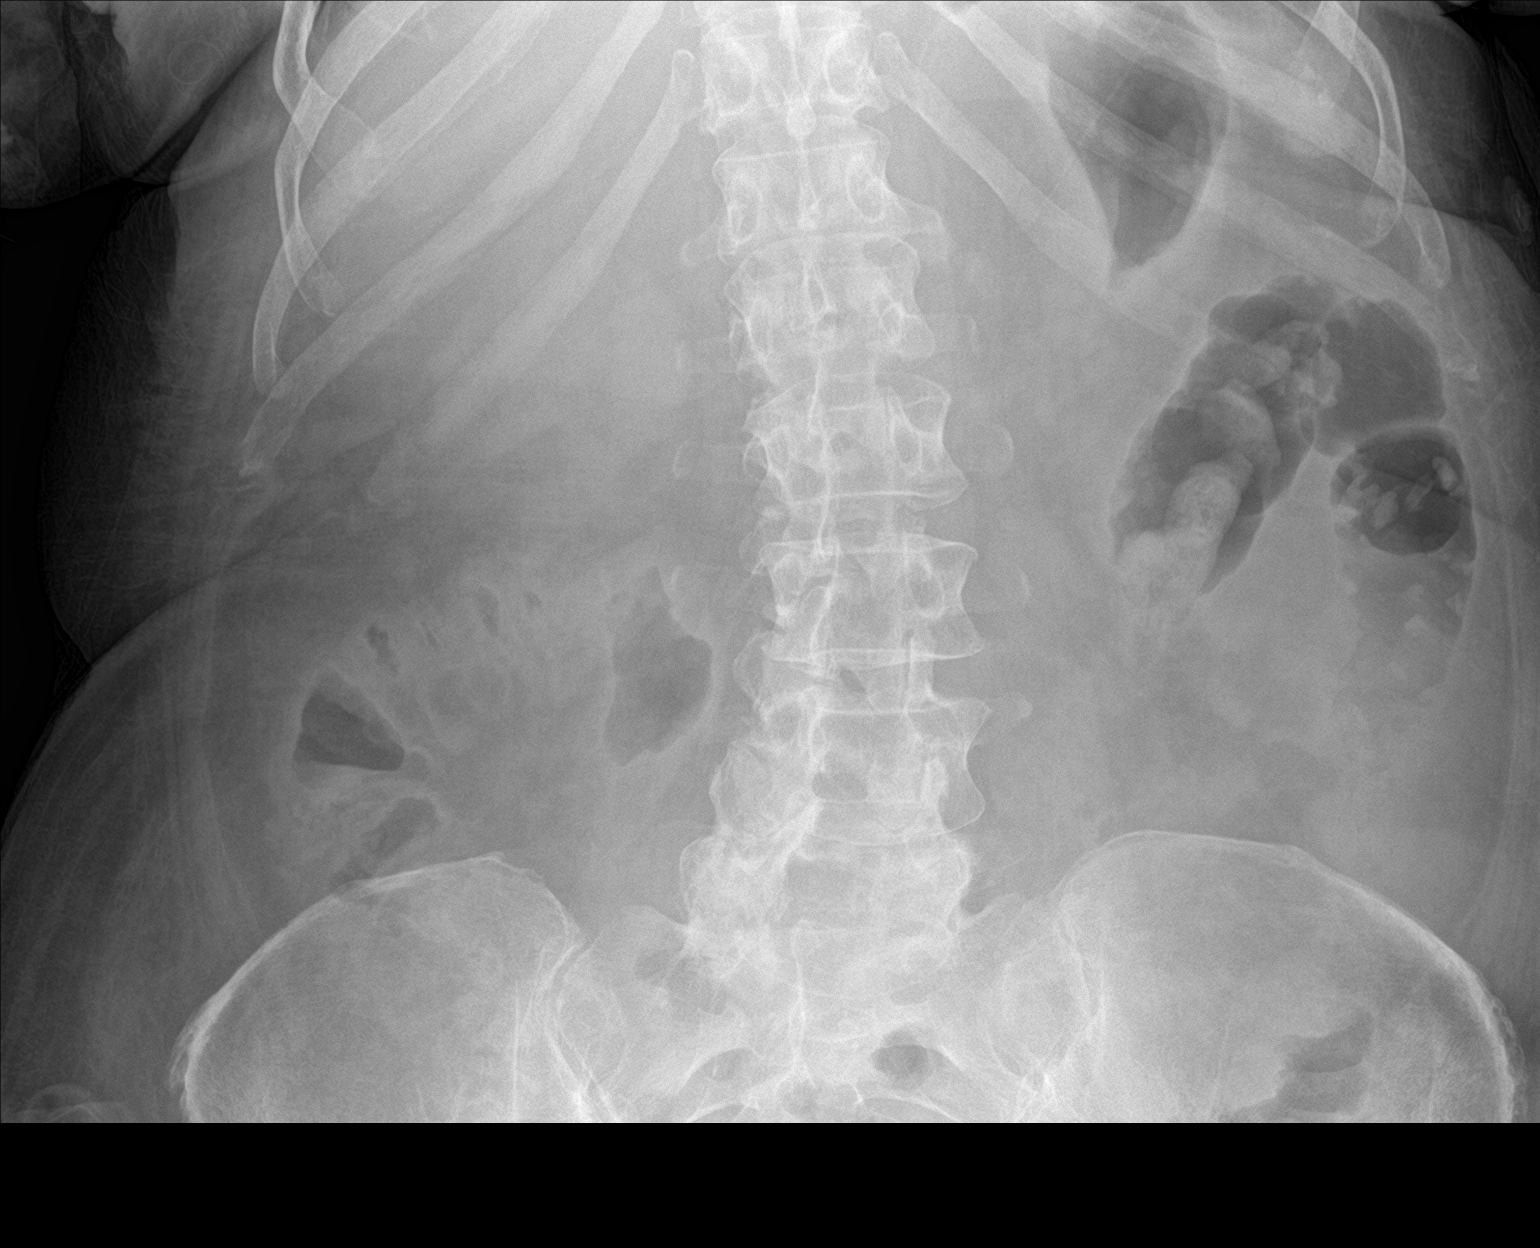

[abdomen supine (2 of 2)]
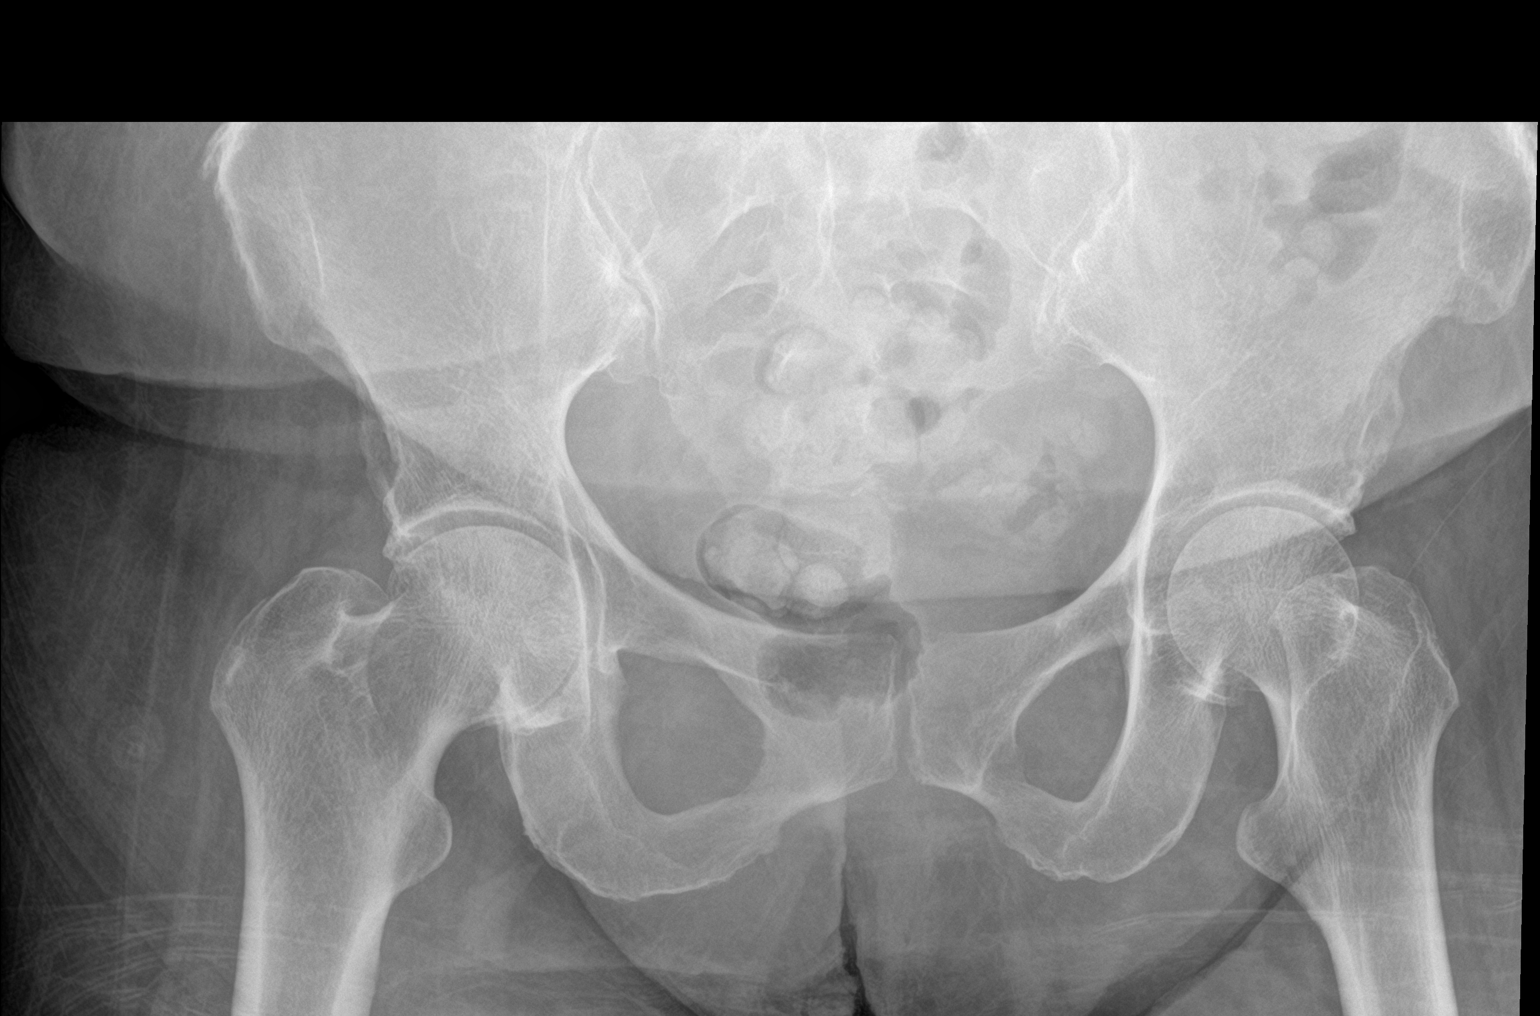

[3 of 3 positions shown; findings below may reference images not displayed]

FINDINGS: Right lung base clear.  Layering left pleural effusion.

No free air.

Normal bowel gas pattern.

No abnormal abdominal calcifications.

Regional bones unremarkable.
IMPRESSION: 1. Nonobstructive bowel gas pattern.
2. Layering left pleural effusion.
